# Patient Record
Sex: Male | Born: 1940 | Race: White | Hispanic: No | Smoking: Former smoker
Health system: Southern US, Community
[De-identification: ages and names within clinical notes are randomized; demographics above are authoritative.]

## PROBLEM LIST (undated history)

## (undated) DIAGNOSIS — E119 Type 2 diabetes mellitus without complications: Secondary | ICD-10-CM

## (undated) DIAGNOSIS — M1990 Unspecified osteoarthritis, unspecified site: Secondary | ICD-10-CM

## (undated) DIAGNOSIS — F32A Depression, unspecified: Secondary | ICD-10-CM

## (undated) DIAGNOSIS — I255 Ischemic cardiomyopathy: Secondary | ICD-10-CM

## (undated) DIAGNOSIS — M109 Gout, unspecified: Secondary | ICD-10-CM

## (undated) DIAGNOSIS — E785 Hyperlipidemia, unspecified: Secondary | ICD-10-CM

## (undated) DIAGNOSIS — I10 Essential (primary) hypertension: Secondary | ICD-10-CM

## (undated) HISTORY — DX: Type 2 diabetes mellitus without complications: E11.9

## (undated) HISTORY — DX: Ischemic cardiomyopathy: I25.5

## (undated) HISTORY — DX: Gout, unspecified: M10.9

## (undated) HISTORY — DX: Essential (primary) hypertension: I10

## (undated) HISTORY — DX: Unspecified osteoarthritis, unspecified site: M19.90

## (undated) HISTORY — DX: Depression, unspecified: F32.A

## (undated) HISTORY — DX: Hyperlipidemia, unspecified: E78.5

---

## 2005-12-15 ENCOUNTER — Ambulatory Visit: Payer: Self-pay | Admitting: Family Medicine

## 2005-12-21 ENCOUNTER — Ambulatory Visit: Payer: Self-pay | Admitting: Ophthalmology

## 2007-12-23 IMAGING — US US CAROTID DUPLEX BILAT
1 series · 17 of 24 positions shown · non-contrast
Comparison: none

REASON FOR EXAM: Hollenhorst plaque
COMMENTS:

[Series 1: us carotid duplex bilat · 17 of 53 slices shown]
[im 1/53]
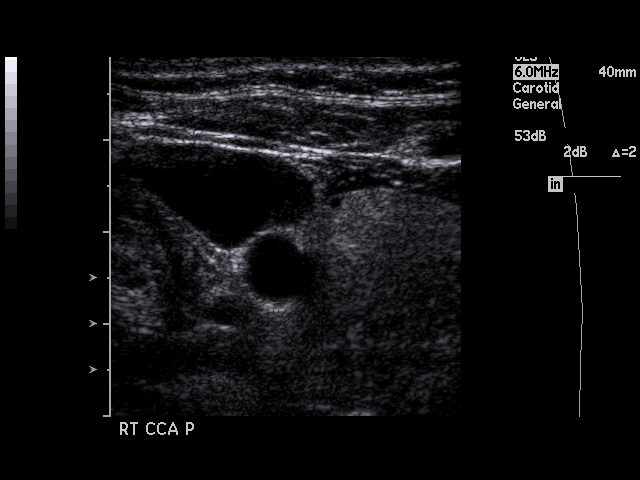
[im 5/53]
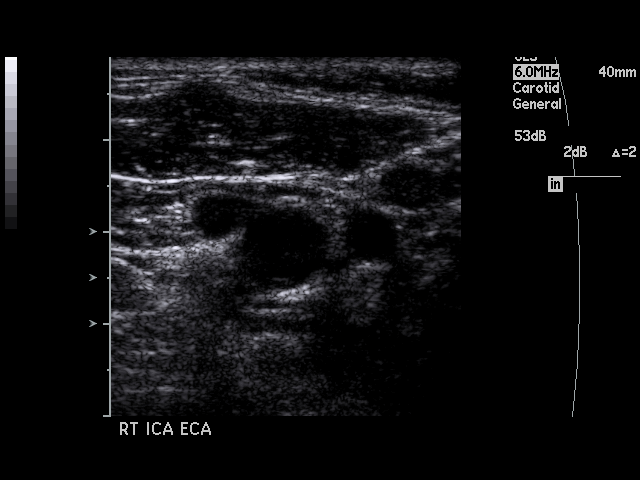
[im 7/53]
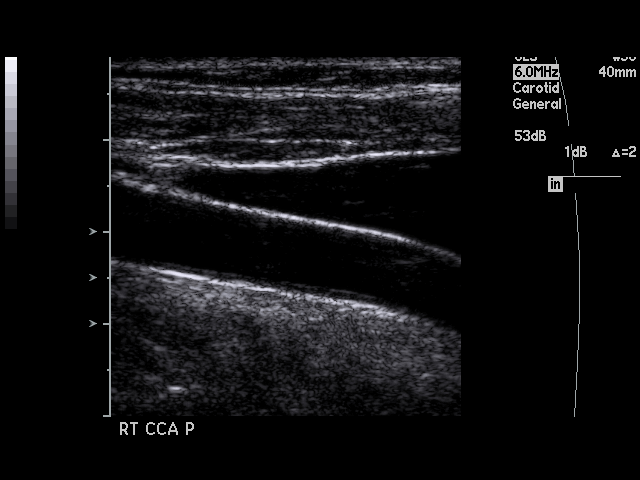
[im 10/53]
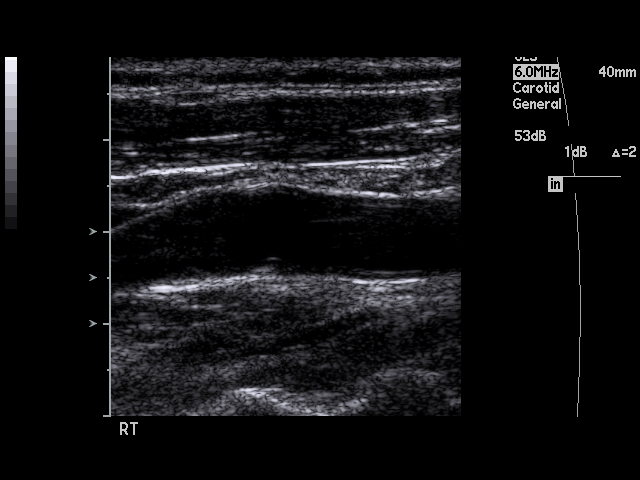
[im 14/53]
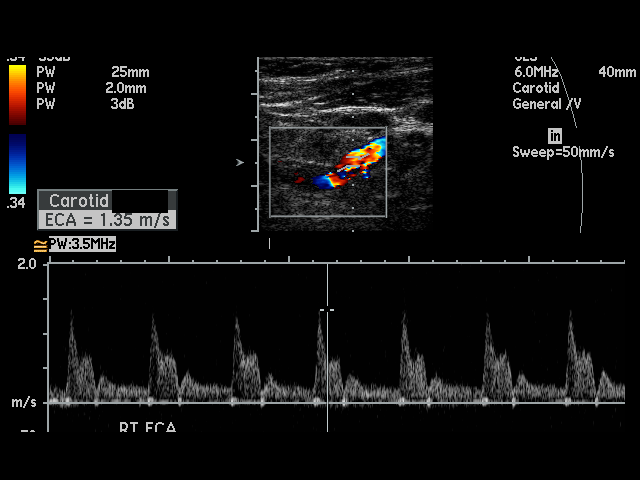
[im 16/53]
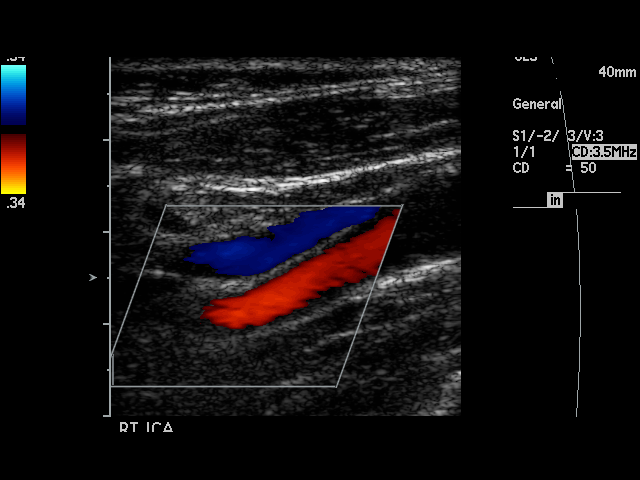
[im 21/53]
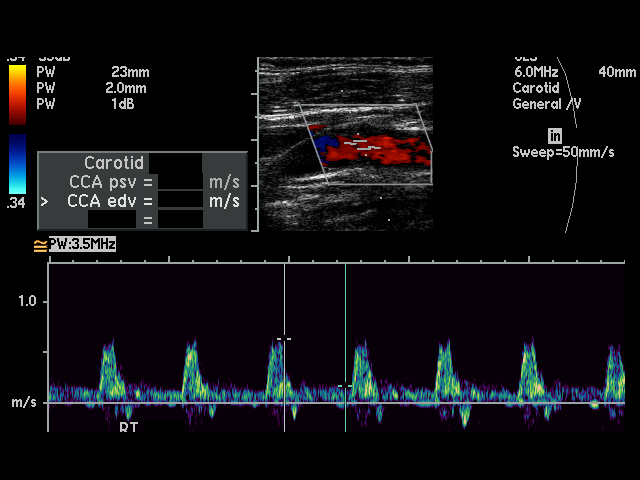
[im 23/53]
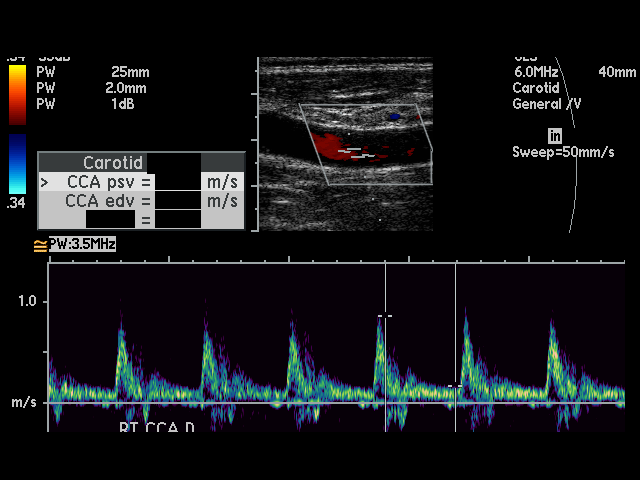
[im 28/53]
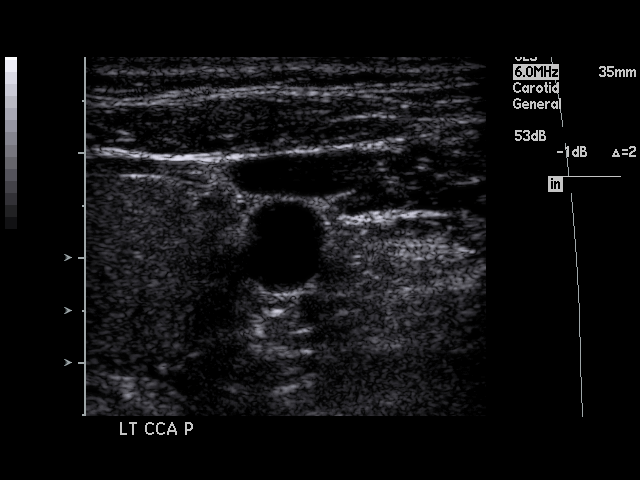
[im 30/53]
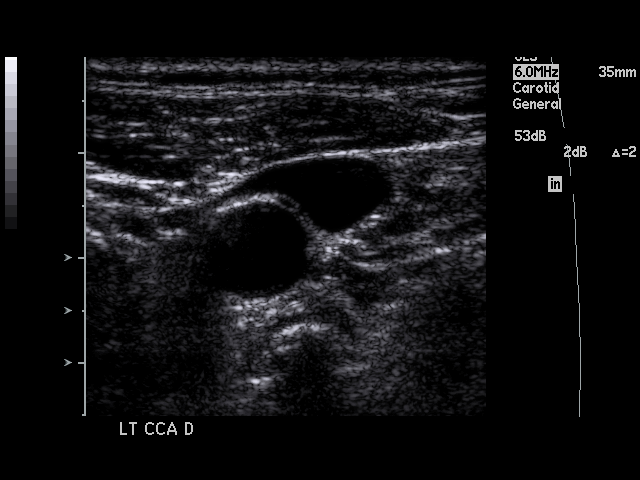
[im 32/53]
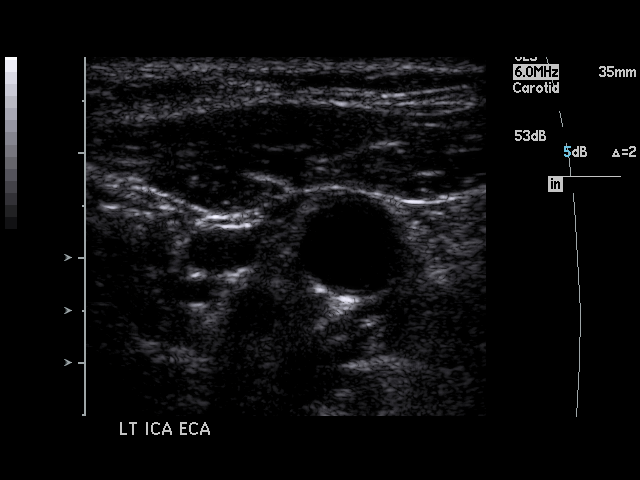
[im 37/53]
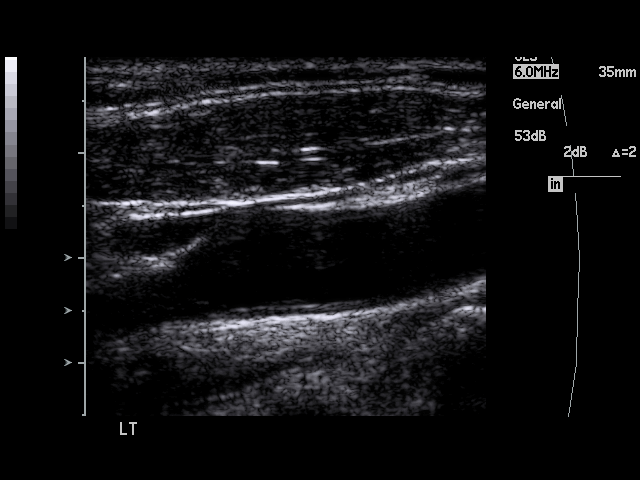
[im 39/53]
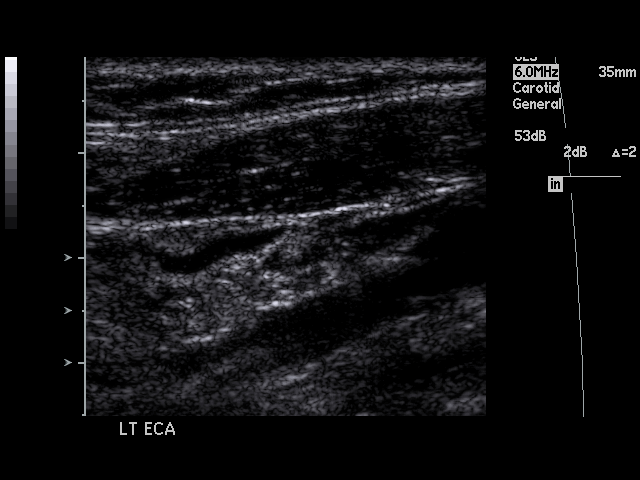
[im 43/53]
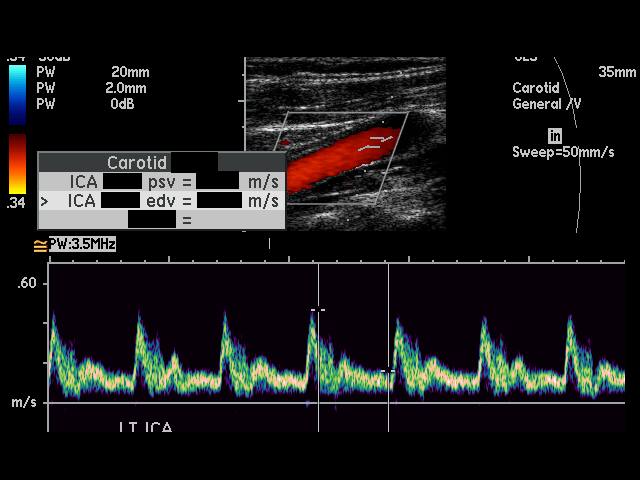
[im 46/53]
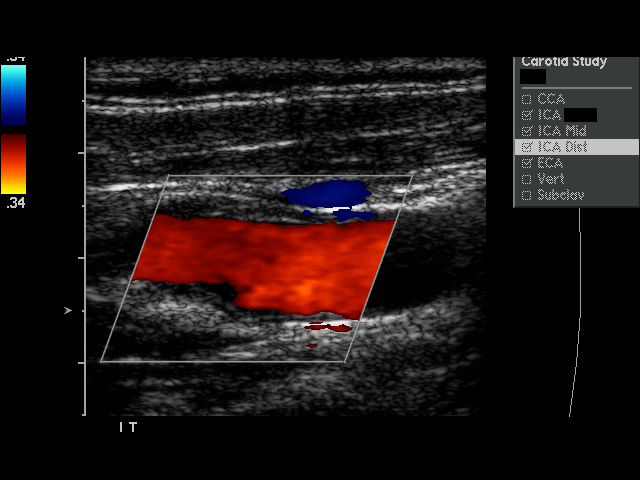
[im 48/53]
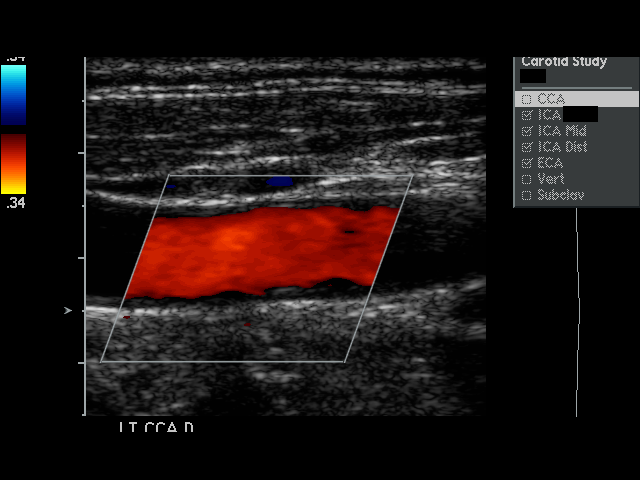
[im 53/53]
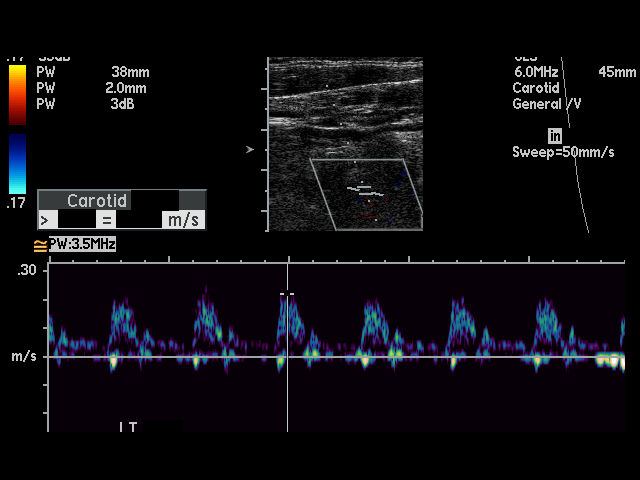

[17 of 24 positions shown; findings below may reference images not displayed]

PROCEDURE:     US  - US CAROTID DOPPLER BILATERAL  - December 21, 2005  [DATE]

RESULT:       Gray scale, color flow and SPECTRAL waveform images were
performed in evaluation of the RIGHT and LEFT carotid arteries.  Visual
evaluation demonstrates an area of soft plaque involving the carotid bulb of
the RIGHT carotid artery demonstrating less than 50% stenosis.  Similar
findings were demonstrated within the LEFT carotid artery.

ICA/CCA ratios:

RIGHT
LEFT

SPECTRAL waveform, color flow and gray scale is unremarkable within the
RIGHT and LEFT carotid systems.

Antegrade flow is demonstrated within the RIGHT and LEFT vertebral arteries.
IMPRESSION: No sonographic evidence of hemodynamically significant
stenosis within the RIGHT or LEFT carotid systems.

## 2011-06-16 DIAGNOSIS — I2510 Atherosclerotic heart disease of native coronary artery without angina pectoris: Secondary | ICD-10-CM | POA: Diagnosis present

## 2011-06-16 DIAGNOSIS — E1159 Type 2 diabetes mellitus with other circulatory complications: Secondary | ICD-10-CM | POA: Insufficient documentation

## 2011-06-16 DIAGNOSIS — E119 Type 2 diabetes mellitus without complications: Secondary | ICD-10-CM

## 2012-07-30 DIAGNOSIS — H5461 Unqualified visual loss, right eye, normal vision left eye: Secondary | ICD-10-CM | POA: Insufficient documentation

## 2014-01-22 ENCOUNTER — Ambulatory Visit: Payer: Self-pay | Admitting: Family Medicine

## 2014-01-22 DIAGNOSIS — F4321 Adjustment disorder with depressed mood: Secondary | ICD-10-CM | POA: Insufficient documentation

## 2015-02-16 ENCOUNTER — Other Ambulatory Visit: Payer: Self-pay | Admitting: Ophthalmology

## 2015-02-16 DIAGNOSIS — H4710 Unspecified papilledema: Secondary | ICD-10-CM

## 2015-02-19 ENCOUNTER — Ambulatory Visit: Payer: Medicare Other

## 2015-02-19 ENCOUNTER — Other Ambulatory Visit: Payer: Self-pay

## 2016-04-16 DIAGNOSIS — D75839 Thrombocytosis, unspecified: Secondary | ICD-10-CM | POA: Diagnosis present

## 2018-06-12 DIAGNOSIS — I255 Ischemic cardiomyopathy: Secondary | ICD-10-CM | POA: Diagnosis present

## 2018-06-12 DIAGNOSIS — I493 Ventricular premature depolarization: Secondary | ICD-10-CM | POA: Insufficient documentation

## 2020-12-16 ENCOUNTER — Other Ambulatory Visit: Payer: Self-pay

## 2020-12-16 ENCOUNTER — Encounter: Payer: Self-pay | Admitting: *Deleted

## 2020-12-16 ENCOUNTER — Encounter: Payer: Medicare HMO | Attending: Family Medicine | Admitting: *Deleted

## 2020-12-16 VITALS — BP 108/60 | Ht 71.0 in | Wt 230.1 lb

## 2020-12-16 DIAGNOSIS — E119 Type 2 diabetes mellitus without complications: Secondary | ICD-10-CM | POA: Diagnosis present

## 2020-12-16 NOTE — Patient Instructions (Addendum)
Exercise: Continue walking for   20  minutes  3 days a week and gradually increase as tolerated  Eat 3 meals day, 1-2  snacks a day Space meals 4-6 hours apart Don't skip meals Eat 1 serving of protein with each meal and when eating fruit for a snack Avoid sugar sweetened drinks (soda) when using as a mixer for alchohol  Complete 3 Day Food Record and bring to next appt  Make an eye doctor appointment  Return for appointment on:  Wednesday January 13, 2021 at 10:30 am with [facility] (dietitian)

## 2020-12-17 NOTE — Progress Notes (Signed)
Diabetes Self-Management Education  Visit Type: First/Initial  Appt. Start Time: 1535 Appt. End Time: 1635  12/16/2020  Mr. [name] [name], identified by name and date of birth, is a 80 y.o. male with a diagnosis of Diabetes: Type 2.   ASSESSMENT  Blood pressure 108/60, height 5\' 11"  (1.803 m), weight 230 lb 1.6 oz (104.4 kg). Body mass index is 32.09 kg/m.   Diabetes Self-Management Education - 12/16/20 1643      Visit Information   Visit Type First/Initial      Initial Visit   Diabetes Type Type 2    Are you currently following a meal plan? No   "I stopped eating bread"   Are you taking your medications as prescribed? Yes    Date Diagnosed Jan 2021      Health Coping   How would you rate your overall health? Good      Psychosocial Assessment   Patient Belief/Attitude about Diabetes Other (comment)   "I feel I can handle it"   Self-care barriers None    Self-management support Doctor's office    Patient Concerns Nutrition/Meal planning;Glycemic Control;Medication;Monitoring;Healthy Lifestyle;Weight Control    Special Needs None    Preferred Learning Style Visual;Hands on    Learning Readiness Change in progress    How often do you need to have someone help you when you read instructions, pamphlets, or other written materials from your doctor or pharmacy? 1 - Never    What is the last grade level you completed in school? 10th      Pre-Education Assessment   Patient understands the diabetes disease and treatment process. Needs Instruction    Patient understands incorporating nutritional management into lifestyle. Needs Instruction    Patient undertands incorporating physical activity into lifestyle. Needs Instruction    Patient understands using medications safely. Needs Instruction    Patient understands monitoring blood glucose, interpreting and using results Needs Instruction    Patient understands prevention, detection, and treatment of acute complications. Needs  Instruction    Patient understands prevention, detection, and treatment of chronic complications. Needs Instruction    Patient understands how to develop strategies to address psychosocial issues. Needs Instruction    Patient understands how to develop strategies to promote health/change behavior. Needs Instruction      Complications   Last HgB A1C per patient/outside source 6.4 %   12/16/2020   How often do you check your blood sugar? Patient declines   Pt doesn't want to check his blood sugars at home. BG in the office was 110 mg/dL at 02/15/2021 pm - 5 1/2 hrs pp.   Have you had a dilated eye exam in the past 12 months? No    Have you had a dental exam in the past 12 months? No   dentures   Are you checking your feet? Yes    How many days per week are you checking your feet? 7      Dietary Intake   Breakfast raisin bran or cheerios with milk; eggs and bacon    Lunch skips or eats a banana    Snack (afternoon) 0-1 snack/day - peanut butter crackers    Dinner hamburger steak, chicken, pork, fish; green beans, peas, pinto beans, greens, cabbage, salad wtih lettuce tomatoes cuccumbers carrots    Beverage(s) water, coffee wtih Splenda, regular Coke if mixing drink      Exercise   Exercise Type Light (walking / raking leaves)    How many days per week to you exercise?  3    How many minutes per day do you exercise? 20    Total minutes per week of exercise 60      Patient Education   Previous Diabetes Education No    Disease state  Definition of diabetes, type 1 and 2, and the diagnosis of diabetes;Factors that contribute to the development of diabetes    Nutrition management  Role of diet in the treatment of diabetes and the relationship between the three main macronutrients and blood glucose level;Food label reading, portion sizes and measuring food.    Physical activity and exercise  Role of exercise on diabetes management, blood pressure control and cardiac health.    Monitoring Identified  appropriate SMBG and/or A1C goals.;Yearly dilated eye exam    Chronic complications Relationship between chronic complications and blood glucose control    Psychosocial adjustment Identified and addressed patients feelings and concerns about diabetes      Individualized Goals (developed by patient)   Reducing Risk Other (comment)   improve blood sugars, decrease medications, prevent diabetes complications, lose weight, lead a healthier lifestyle, become more fit     Outcomes   Expected Outcomes Demonstrated interest in learning. Expect positive outcomes    Future DMSE 4-6 wks           Individualized Plan for Diabetes Self-Management Training:   Learning Objective:  Patient will have a greater understanding of diabetes self-management. Patient education plan is to attend individual and/or group sessions per assessed needs and concerns.   Plan:   Patient Instructions  Exercise: Continue walking for   20  minutes  3 days a week and gradually increase as tolerated  Eat 3 meals day, 1-2  snacks a day Space meals 4-6 hours apart Don't skip meals Eat 1 serving of protein with each meal and when eating fruit for a snack Avoid sugar sweetened drinks (soda) when using as a mixer for alchohol  Complete 3 Day Food Record and bring to next appt  Make an eye doctor appointment  Return for appointment on:  Wednesday January 13, 2021 at 10:30 am with [facility] (dietitian)   Expected Outcomes:  Demonstrated interest in learning. Expect positive outcomes  Education material provided: General Meal Planning Guidelines Simple Meal Plan 3 Day Food Record Plate Method (ADA)  If problems or questions, patient to contact team via:  Sharion Settler, RN, CCM, CDCES [phone]  Future DSME appointment: 4-6 wks  Pt reports that Diabetes classes are too long. He will return for the 2 Hour refresher Program and his next appointment is scheduled for June 8 with the dietitian.

## 2021-01-13 ENCOUNTER — Ambulatory Visit: Payer: Medicare HMO | Admitting: Dietician

## 2021-02-18 ENCOUNTER — Encounter: Payer: Self-pay | Admitting: Dietician

## 2021-02-18 NOTE — Progress Notes (Signed)
Have not heard back from patient to reschedule his missed appointment from 01/13/21. Sent notification to referring provider.

## 2023-08-12 ENCOUNTER — Emergency Department: Payer: Medicare HMO

## 2023-08-12 ENCOUNTER — Other Ambulatory Visit: Payer: Self-pay

## 2023-08-12 ENCOUNTER — Emergency Department
Admission: EM | Admit: 2023-08-12 | Discharge: 2023-08-12 | Disposition: A | Discharge status: Home / Self Care | Payer: Medicare HMO | Attending: Emergency Medicine | Admitting: Emergency Medicine

## 2023-08-12 DIAGNOSIS — R112 Nausea with vomiting, unspecified: Secondary | ICD-10-CM | POA: Insufficient documentation

## 2023-08-12 DIAGNOSIS — R059 Cough, unspecified: Secondary | ICD-10-CM | POA: Insufficient documentation

## 2023-08-12 DIAGNOSIS — E119 Type 2 diabetes mellitus without complications: Secondary | ICD-10-CM | POA: Diagnosis not present

## 2023-08-12 DIAGNOSIS — R0602 Shortness of breath: Secondary | ICD-10-CM | POA: Insufficient documentation

## 2023-08-12 DIAGNOSIS — I10 Essential (primary) hypertension: Secondary | ICD-10-CM | POA: Insufficient documentation

## 2023-08-12 DIAGNOSIS — R0981 Nasal congestion: Secondary | ICD-10-CM | POA: Insufficient documentation

## 2023-08-12 DIAGNOSIS — Z20822 Contact with and (suspected) exposure to covid-19: Secondary | ICD-10-CM | POA: Diagnosis not present

## 2023-08-12 DIAGNOSIS — R531 Weakness: Secondary | ICD-10-CM | POA: Diagnosis present

## 2023-08-12 LAB — CBC WITH DIFFERENTIAL/PLATELET
Abs Immature Granulocytes: 0.07 10*3/uL (ref 0.00–0.07)
Basophils Absolute: 0 10*3/uL (ref 0.0–0.1)
Basophils Relative: 0 %
Eosinophils Absolute: 0.1 10*3/uL (ref 0.0–0.5)
Eosinophils Relative: 1 %
HCT: 47.5 % (ref 39.0–52.0)
Hemoglobin: 16.1 g/dL (ref 13.0–17.0)
Immature Granulocytes: 1 %
Lymphocytes Relative: 6 %
Lymphs Abs: 0.7 10*3/uL (ref 0.7–4.0)
MCH: 29.5 pg (ref 26.0–34.0)
MCHC: 33.9 g/dL (ref 30.0–36.0)
MCV: 87.2 fL (ref 80.0–100.0)
Monocytes Absolute: 0.6 10*3/uL (ref 0.1–1.0)
Monocytes Relative: 6 %
Neutro Abs: 9.6 10*3/uL — ABNORMAL HIGH (ref 1.7–7.7)
Neutrophils Relative %: 86 %
Platelets: 639 10*3/uL — ABNORMAL HIGH (ref 150–400)
RBC: 5.45 MIL/uL (ref 4.22–5.81)
RDW: 14.2 % (ref 11.5–15.5)
WBC: 11.2 10*3/uL — ABNORMAL HIGH (ref 4.0–10.5)
nRBC: 0 % (ref 0.0–0.2)

## 2023-08-12 LAB — COMPREHENSIVE METABOLIC PANEL
ALT: 15 U/L (ref 0–44)
AST: 21 U/L (ref 15–41)
Albumin: 3.9 g/dL (ref 3.5–5.0)
Alkaline Phosphatase: 96 U/L (ref 38–126)
Anion gap: 14 (ref 5–15)
BUN: 16 mg/dL (ref 8–23)
CO2: 22 mmol/L (ref 22–32)
Calcium: 8.5 mg/dL — ABNORMAL LOW (ref 8.9–10.3)
Chloride: 103 mmol/L (ref 98–111)
Creatinine, Ser: 0.92 mg/dL (ref 0.61–1.24)
GFR, Estimated: >60 mL/min (ref >60)
Glucose, Bld: 227 mg/dL — ABNORMAL HIGH (ref 70–99)
Potassium: 2.9 mmol/L — ABNORMAL LOW (ref 3.5–5.1)
Sodium: 139 mmol/L (ref 135–145)
Total Bilirubin: 0.9 mg/dL (ref 0.0–1.2)
Total Protein: 7 g/dL (ref 6.5–8.1)

## 2023-08-12 LAB — TROPONIN I (HIGH SENSITIVITY)
Troponin I (High Sensitivity): 12 ng/L (ref <18)
Troponin I (High Sensitivity): 11 ng/L (ref <18)

## 2023-08-12 LAB — RESP PANEL BY RT-PCR (RSV, FLU A&B, COVID)  RVPGX2
Influenza A by PCR: NEGATIVE
Influenza B by PCR: NEGATIVE
Resp Syncytial Virus by PCR: NEGATIVE
SARS Coronavirus 2 by RT PCR: NEGATIVE

## 2023-08-12 MED ORDER — POTASSIUM CHLORIDE 10 MEQ/100ML IV SOLN
10.0000 meq | Freq: Once | INTRAVENOUS | Status: AC
Start: 2023-08-12 — End: 2023-08-12
  Administered 2023-08-12: 10 meq via INTRAVENOUS
  Filled 2023-08-12: qty 100

## 2023-08-12 MED ORDER — POTASSIUM CHLORIDE CRYS ER 20 MEQ PO TBCR
40.0000 meq | EXTENDED_RELEASE_TABLET | Freq: Once | ORAL | Status: AC
Start: 2023-08-12 — End: 2023-08-12
  Administered 2023-08-12: 40 meq via ORAL
  Filled 2023-08-12: qty 2

## 2023-08-12 MED ORDER — HYALURONIDASE HUMAN 150 UNIT/ML IJ SOLN
150.0000 [IU] | Freq: Once | INTRAMUSCULAR | Status: DC
  Filled 2023-08-12: qty 1

## 2023-08-12 MED ORDER — SODIUM CHLORIDE 0.9 % IV BOLUS
1000.0000 mL | Freq: Once | INTRAVENOUS | Status: AC
  Administered 2023-08-12: 1000 mL via INTRAVENOUS

## 2023-08-12 NOTE — ED Notes (Signed)
 Pt able to drink entire cup of water w/o issue.

## 2023-08-12 NOTE — ED Triage Notes (Signed)
 Patient states he woke up feeling "normal", sat down at the computer and started feeling faint; denies LOC. Patient complaining of generalized weakness, N/V. EMS concerned about Code Stroke, not called by Dr. Vicente Males.

## 2023-08-12 NOTE — ED Provider Triage Note (Signed)
 Emergency Medicine Provider Triage Evaluation Note  [name] [name] , a 83 y.o. male  was evaluated in triage.  Pt complains of weakness. Woke up feeling fine then later began to feel weak. No syncope.   Review of Systems  Positive: Weakness, nausea Negative:   Physical Exam  There were no vitals taken for this visit. Gen:   Awake, no distress   Resp:  Normal effort  MSK:   Moves extremities without difficulty  Other:  No facial droop, 5/5 strength in bilateral lower and upper extremities.   Medical Decision Making  Medically screening exam initiated at 11:45 AM.  Appropriate orders placed.  Pamela L Flores was informed that the remainder of the evaluation will be completed by another provider, this initial triage assessment does not replace that evaluation, and the importance of remaining in the ED until their evaluation is complete.    Cleaster Tinnie LABOR, PA-C 08/12/23 1148

## 2023-08-12 NOTE — ED Notes (Signed)
 Pt stating no pain to arm or IV site. Pt has normal color to arm.

## 2023-08-12 NOTE — Discharge Instructions (Signed)
 Please seek medical attention for any high fevers, chest pain, shortness of breath, change in behavior, persistent vomiting, bloody stool or any other new or concerning symptoms.

## 2023-08-12 NOTE — ED Provider Notes (Signed)
 [facility] Provider Note    Event Date/Time   First MD Initiated Contact with Patient 08/12/23 1340     (approximate)   History   Weakness   HPI [name] [name] is a 83 y.o. male with history of DM2, HTN, HLD presenting today for weakness.  He reports that over the past couple days he has had intermittent cough and congestion symptoms.  He has been gradually feeling weaker as well over the past 7 days.  Reportedly went to sit down on his computer when all of a sudden he felt faint and nauseous.  Had 2 episodes of vomiting.  Denied chest pain, shortness of breath, palpitations, syncope.  Denies symptoms similar to this in the past.  Currently in the ED he only notices slight weakness which improved with fluids with EMS.     Physical Exam   Triage Vital Signs: ED Triage Vitals  Encounter Vitals Group     BP 08/12/23 1152 (!) 161/78     Systolic BP Percentile --      Diastolic BP Percentile --      Pulse Rate 08/12/23 1152 74     Resp 08/12/23 1152 16     Temp 08/12/23 1152 97.9 F (36.6 C)     Temp Source 08/12/23 1152 Oral     SpO2 08/12/23 1152 96 %     Weight 08/12/23 1148 230 lb (104.3 kg)     Height 08/12/23 1148 5' 11 (1.803 m)     Head Circumference --      Peak Flow --      Pain Score 08/12/23 1147 0     Pain Loc --      Pain Education --      Exclude from Growth Chart --     Most recent vital signs: Vitals:   08/12/23 1152  BP: (!) 161/78  Pulse: 74  Resp: 16  Temp: 97.9 F (36.6 C)  SpO2: 96%   Physical Exam: I have reviewed the vital signs and nursing notes. General: Awake, alert, no acute distress.  Nontoxic appearing. Head:  Atraumatic, normocephalic.   ENT:  EOM intact, PERRL. Oral mucosa is pink and moist with no lesions. Neck: Neck is supple with full range of motion, No meningeal signs. Cardiovascular:  RRR, No murmurs. Peripheral pulses palpable and equal bilaterally. Respiratory:  Symmetrical chest wall  expansion.  No rhonchi, rales, or wheezes.  Good air movement throughout.  No use of accessory muscles.   Musculoskeletal:  No cyanosis or edema. Moving extremities with full ROM Abdomen:  Soft, nontender, nondistended. Neuro:  GCS 15, moving all four extremities, interacting appropriately. Speech clear.  Cranial nerves II through XII intact.  5 out of 5 strength in bilateral upper and lower extremities.  Sensation intact and equal to bilateral upper and lower extremities. Psych:  Calm, appropriate.   Skin:  Warm, dry, no rash.    ED Results / Procedures / Treatments   Labs (all labs ordered are listed, but only abnormal results are displayed) Labs Reviewed  COMPREHENSIVE METABOLIC PANEL - Abnormal; Notable for the following components:      Result Value   Potassium 2.9 (*)    Glucose, Bld 227 (*)    Calcium  8.5 (*)    All other components within normal limits  CBC WITH DIFFERENTIAL/PLATELET - Abnormal; Notable for the following components:   WBC 11.2 (*)    Platelets 639 (*)    Neutro Abs 9.6 (*)  All other components within normal limits  RESP PANEL BY RT-PCR (RSV, FLU A&B, COVID)  RVPGX2  TROPONIN I (HIGH SENSITIVITY)  TROPONIN I (HIGH SENSITIVITY)     EKG My EKG interpretation: Rate of 75, normal sinus rhythm, normal axis, normal intervals.  T wave inversions present in lead II, 3, aVF, V4, V5, V6.  No prior EKG to compare to.  No other obvious ST elevation or depression   RADIOLOGY Independently interpreted chest x-ray with no acute pathology   PROCEDURES:  Critical Care performed: No  Procedures   MEDICATIONS ORDERED IN ED: Medications  potassium chloride  10 mEq in 100 mL IVPB (10 mEq Intravenous New Bag/Given 08/12/23 1501)  sodium chloride  0.9 % bolus 1,000 mL (1,000 mLs Intravenous New Bag/Given 08/12/23 1459)  potassium chloride  SA (KLOR-CON  M) CR tablet 40 mEq (40 mEq Oral Given 08/12/23 1454)     IMPRESSION / MDM / ASSESSMENT AND PLAN / ED COURSE  I  reviewed the triage vital signs and the nursing notes.                              Differential diagnosis includes, but is not limited to, near vasovagal episode, orthostatic hypotension, dehydration, electrolyte abnormality, cardiac arrhythmia  Patient's presentation is most consistent with acute complicated illness / injury requiring diagnostic workup.  Patient is an 83 year old male presenting today for weakness in the setting of 1 episode of lightheadedness with nausea and vomiting.  Symptoms completely resolved aside from weakness by the time of arrival.  Vital signs otherwise stable.  Physical exam and specifically neurological exam completely unremarkable.  EKG without any acute ischemic findings and troponins negative x 2.  Laboratory workup with mild hypokalemia but otherwise unremarkable.  Chest x-ray with no acute findings.  Patient given 1 L fluids as well as potassium repletion.  Signed out pending results of respiratory swab and p.o. challenge/ambulation.  Suspect patient will likely be able to go home and follow-up with PCP if all this is unremarkable.  The patient is on the cardiac monitor to evaluate for evidence of arrhythmia and/or significant heart rate changes.     FINAL CLINICAL IMPRESSION(S) / ED DIAGNOSES   Final diagnoses:  Weakness     Rx / DC Orders   ED Discharge Orders     None        Note:  This document was prepared using Dragon voice recognition software and may include unintentional dictation errors.   Malvina Alm DASEN, MD 08/12/23 [phone]

## 2023-08-12 NOTE — ED Notes (Addendum)
 Per Katie RN, line infusing potassium infiltrated. Pharmacy called reccomends hyaluronidase and ice with elevation.

## 2023-11-16 ENCOUNTER — Other Ambulatory Visit: Payer: Self-pay

## 2023-11-16 ENCOUNTER — Observation Stay

## 2023-11-16 ENCOUNTER — Inpatient Hospital Stay
Admission: EM | Admit: 2023-11-16 | Discharge: 2023-11-19 | DRG: 065 | Disposition: A | Discharge status: Rehabilitation Facility | Attending: Student | Admitting: Student

## 2023-11-16 ENCOUNTER — Emergency Department

## 2023-11-16 ENCOUNTER — Encounter: Payer: Self-pay | Admitting: Internal Medicine

## 2023-11-16 DIAGNOSIS — Z7982 Long term (current) use of aspirin: Secondary | ICD-10-CM

## 2023-11-16 DIAGNOSIS — I2510 Atherosclerotic heart disease of native coronary artery without angina pectoris: Secondary | ICD-10-CM | POA: Diagnosis not present

## 2023-11-16 DIAGNOSIS — E785 Hyperlipidemia, unspecified: Secondary | ICD-10-CM | POA: Diagnosis not present

## 2023-11-16 DIAGNOSIS — E639 Nutritional deficiency, unspecified: Secondary | ICD-10-CM | POA: Diagnosis present

## 2023-11-16 DIAGNOSIS — E119 Type 2 diabetes mellitus without complications: Secondary | ICD-10-CM

## 2023-11-16 DIAGNOSIS — I6389 Other cerebral infarction: Secondary | ICD-10-CM | POA: Diagnosis not present

## 2023-11-16 DIAGNOSIS — G8191 Hemiplegia, unspecified affecting right dominant side: Secondary | ICD-10-CM | POA: Diagnosis present

## 2023-11-16 DIAGNOSIS — R471 Dysarthria and anarthria: Secondary | ICD-10-CM | POA: Diagnosis present

## 2023-11-16 DIAGNOSIS — E559 Vitamin D deficiency, unspecified: Secondary | ICD-10-CM | POA: Diagnosis present

## 2023-11-16 DIAGNOSIS — I639 Cerebral infarction, unspecified: Secondary | ICD-10-CM | POA: Diagnosis not present

## 2023-11-16 DIAGNOSIS — Z6827 Body mass index (BMI) 27.0-27.9, adult: Secondary | ICD-10-CM

## 2023-11-16 DIAGNOSIS — R29713 NIHSS score 13: Secondary | ICD-10-CM | POA: Diagnosis present

## 2023-11-16 DIAGNOSIS — I5042 Chronic combined systolic (congestive) and diastolic (congestive) heart failure: Secondary | ICD-10-CM | POA: Diagnosis present

## 2023-11-16 DIAGNOSIS — R29712 NIHSS score 12: Secondary | ICD-10-CM | POA: Diagnosis not present

## 2023-11-16 DIAGNOSIS — Z955 Presence of coronary angioplasty implant and graft: Secondary | ICD-10-CM

## 2023-11-16 DIAGNOSIS — F05 Delirium due to known physiological condition: Secondary | ICD-10-CM | POA: Diagnosis not present

## 2023-11-16 DIAGNOSIS — M109 Gout, unspecified: Secondary | ICD-10-CM | POA: Diagnosis present

## 2023-11-16 DIAGNOSIS — Z833 Family history of diabetes mellitus: Secondary | ICD-10-CM

## 2023-11-16 DIAGNOSIS — D75839 Thrombocytosis, unspecified: Secondary | ICD-10-CM | POA: Diagnosis present

## 2023-11-16 DIAGNOSIS — Z91148 Patient's other noncompliance with medication regimen for other reason: Secondary | ICD-10-CM

## 2023-11-16 DIAGNOSIS — I63512 Cerebral infarction due to unspecified occlusion or stenosis of left middle cerebral artery: Principal | ICD-10-CM | POA: Diagnosis present

## 2023-11-16 DIAGNOSIS — R130 Aphagia: Principal | ICD-10-CM | POA: Diagnosis present

## 2023-11-16 DIAGNOSIS — I10 Essential (primary) hypertension: Secondary | ICD-10-CM | POA: Diagnosis not present

## 2023-11-16 DIAGNOSIS — I255 Ischemic cardiomyopathy: Secondary | ICD-10-CM | POA: Diagnosis not present

## 2023-11-16 DIAGNOSIS — Z87891 Personal history of nicotine dependence: Secondary | ICD-10-CM

## 2023-11-16 DIAGNOSIS — Z91199 Patient's noncompliance with other medical treatment and regimen due to unspecified reason: Secondary | ICD-10-CM

## 2023-11-16 DIAGNOSIS — Z79899 Other long term (current) drug therapy: Secondary | ICD-10-CM

## 2023-11-16 DIAGNOSIS — Z7902 Long term (current) use of antithrombotics/antiplatelets: Secondary | ICD-10-CM

## 2023-11-16 DIAGNOSIS — I110 Hypertensive heart disease with heart failure: Secondary | ICD-10-CM | POA: Diagnosis present

## 2023-11-16 DIAGNOSIS — E876 Hypokalemia: Secondary | ICD-10-CM | POA: Diagnosis present

## 2023-11-16 DIAGNOSIS — F32A Depression, unspecified: Secondary | ICD-10-CM | POA: Diagnosis present

## 2023-11-16 DIAGNOSIS — R29810 Facial weakness: Secondary | ICD-10-CM | POA: Diagnosis present

## 2023-11-16 DIAGNOSIS — E663 Overweight: Secondary | ICD-10-CM | POA: Diagnosis present

## 2023-11-16 LAB — COMPREHENSIVE METABOLIC PANEL WITH GFR
ALT: 13 U/L (ref 0–44)
AST: 20 U/L (ref 15–41)
Albumin: 4 g/dL (ref 3.5–5.0)
Alkaline Phosphatase: 85 U/L (ref 38–126)
Anion gap: 13 (ref 5–15)
BUN: 15 mg/dL (ref 8–23)
CO2: 21 mmol/L — ABNORMAL LOW (ref 22–32)
Calcium: 8.2 mg/dL — ABNORMAL LOW (ref 8.9–10.3)
Chloride: 104 mmol/L (ref 98–111)
Creatinine, Ser: 0.89 mg/dL (ref 0.61–1.24)
GFR, Estimated: >60 mL/min (ref >60)
Glucose, Bld: 184 mg/dL — ABNORMAL HIGH (ref 70–99)
Potassium: 3.2 mmol/L — ABNORMAL LOW (ref 3.5–5.1)
Sodium: 138 mmol/L (ref 135–145)
Total Bilirubin: 1.2 mg/dL (ref 0.0–1.2)
Total Protein: 6.5 g/dL (ref 6.5–8.1)

## 2023-11-16 LAB — CBC
HCT: 46.1 % (ref 39.0–52.0)
Hemoglobin: 15.6 g/dL (ref 13.0–17.0)
MCH: 29.6 pg (ref 26.0–34.0)
MCHC: 33.8 g/dL (ref 30.0–36.0)
MCV: 87.5 fL (ref 80.0–100.0)
Platelets: 689 10*3/uL — ABNORMAL HIGH (ref 150–400)
RBC: 5.27 MIL/uL (ref 4.22–5.81)
RDW: 13.8 % (ref 11.5–15.5)
WBC: 12.1 10*3/uL — ABNORMAL HIGH (ref 4.0–10.5)
nRBC: 0 % (ref 0.0–0.2)

## 2023-11-16 LAB — DIFFERENTIAL
Abs Immature Granulocytes: 0.04 10*3/uL (ref 0.00–0.07)
Basophils Absolute: 0 10*3/uL (ref 0.0–0.1)
Basophils Relative: 0 %
Eosinophils Absolute: 0.1 10*3/uL (ref 0.0–0.5)
Eosinophils Relative: 0 %
Immature Granulocytes: 0 %
Lymphocytes Relative: 7 %
Lymphs Abs: 0.8 10*3/uL (ref 0.7–4.0)
Monocytes Absolute: 0.9 10*3/uL (ref 0.1–1.0)
Monocytes Relative: 7 %
Neutro Abs: 10.3 10*3/uL — ABNORMAL HIGH (ref 1.7–7.7)
Neutrophils Relative %: 86 %

## 2023-11-16 LAB — ETHANOL: Alcohol, Ethyl (B): <10 mg/dL (ref <10)

## 2023-11-16 LAB — CBG MONITORING, ED: Glucose-Capillary: 182 mg/dL — ABNORMAL HIGH (ref 70–99)

## 2023-11-16 LAB — GLUCOSE, CAPILLARY: Glucose-Capillary: 136 mg/dL — ABNORMAL HIGH (ref 70–99)

## 2023-11-16 LAB — PROTIME-INR
INR: 1.1 (ref 0.8–1.2)
Prothrombin Time: 14.3 s (ref 11.4–15.2)

## 2023-11-16 LAB — APTT: aPTT: 34 s (ref 24–36)

## 2023-11-16 MED ORDER — ROSUVASTATIN CALCIUM 20 MG PO TABS
20.0000 mg | ORAL_TABLET | Freq: Every day | ORAL | Status: DC
  Administered 2023-11-16 – 2023-11-18 (×3): 20 mg via ORAL
  Filled 2023-11-16 (×3): qty 1

## 2023-11-16 MED ORDER — ENOXAPARIN SODIUM 40 MG/0.4ML IJ SOSY: 40.0000 mg | PREFILLED_SYRINGE | INTRAMUSCULAR | Status: DC

## 2023-11-16 MED ORDER — ACETAMINOPHEN 160 MG/5ML PO SOLN: 650.0000 mg | ORAL | Status: DC | PRN

## 2023-11-16 MED ORDER — ONDANSETRON HCL 4 MG/2ML IJ SOLN: 4.0000 mg | Freq: Four times a day (QID) | INTRAMUSCULAR | Status: DC | PRN

## 2023-11-16 MED ORDER — ACETAMINOPHEN 325 MG PO TABS: 650.0000 mg | ORAL_TABLET | ORAL | Status: DC | PRN

## 2023-11-16 MED ORDER — ASPIRIN 325 MG PO TABS
325.0000 mg | ORAL_TABLET | Freq: Once | ORAL | Status: DC
  Filled 2023-11-16: qty 1

## 2023-11-16 MED ORDER — INSULIN ASPART 100 UNIT/ML IJ SOLN
0.0000 [IU] | Freq: Three times a day (TID) | INTRAMUSCULAR | Status: DC
  Administered 2023-11-18: 2 [IU] via SUBCUTANEOUS
  Filled 2023-11-16: qty 1

## 2023-11-16 MED ORDER — SODIUM CHLORIDE 0.9% FLUSH
3.0000 mL | Freq: Once | INTRAVENOUS | Status: AC
  Administered 2023-11-16: 3 mL via INTRAVENOUS

## 2023-11-16 MED ORDER — ACETAMINOPHEN 650 MG RE SUPP: 650.0000 mg | RECTAL | Status: DC | PRN

## 2023-11-16 MED ORDER — STROKE: EARLY STAGES OF RECOVERY BOOK: Freq: Once | Status: AC

## 2023-11-16 MED ORDER — ASPIRIN 81 MG PO TBEC: 81.0000 mg | DELAYED_RELEASE_TABLET | Freq: Every day | ORAL | Status: DC

## 2023-11-16 MED ORDER — SENNOSIDES-DOCUSATE SODIUM 8.6-50 MG PO TABS: 1.0000 | ORAL_TABLET | Freq: Every evening | ORAL | Status: DC | PRN

## 2023-11-16 MED ORDER — IOHEXOL 350 MG/ML SOLN
75.0000 mL | Freq: Once | INTRAVENOUS | Status: AC | PRN
  Administered 2023-11-16: 75 mL via INTRAVENOUS

## 2023-11-16 MED ORDER — ENOXAPARIN SODIUM 60 MG/0.6ML IJ SOSY
0.5000 mg/kg | PREFILLED_SYRINGE | INTRAMUSCULAR | Status: DC
  Administered 2023-11-16 – 2023-11-18 (×3): 52.5 mg via SUBCUTANEOUS
  Filled 2023-11-16 (×3): qty 0.6

## 2023-11-16 NOTE — ED Provider Notes (Signed)
 [facility] Provider Note    Event Date/Time   First MD Initiated Contact with Patient 11/16/23 1502     (approximate)   History   Code Stroke   HPI  [name] [name] is a 83 y.o. male  who presents to the emergency department today because of concern for slurred speech and right facial droop. Last seen normal around 930am. The patient is unable to give exact time when symptoms started.  Patient with history of HTN, DM, HLD.       Physical Exam   Triage Vital Signs: ED Triage Vitals  Encounter Vitals Group     BP      Systolic BP Percentile      Diastolic BP Percentile      Pulse      Resp      Temp      Temp src      SpO2      Weight      Height      Head Circumference      Peak Flow      Pain Score      Pain Loc      Pain Education      Exclude from Growth Chart     Most recent vital signs: There were no vitals filed for this visit.  General: Awake, alert, aphagia. CV:  Good peripheral perfusion.  Resp:  Normal effort.  Abd:  No distention.    ED Results / Procedures / Treatments   Labs (all labs ordered are listed, but only abnormal results are displayed) Labs Reviewed  CBC - Abnormal; Notable for the following components:      Result Value   WBC 12.1 (*)    Platelets 689 (*)    All other components within normal limits  DIFFERENTIAL - Abnormal; Notable for the following components:   Neutro Abs 10.3 (*)    All other components within normal limits  COMPREHENSIVE METABOLIC PANEL WITH GFR - Abnormal; Notable for the following components:   Potassium 3.2 (*)    CO2 21 (*)    Glucose, Bld 184 (*)    Calcium 8.2 (*)    All other components within normal limits  CBG MONITORING, ED - Abnormal; Notable for the following components:   Glucose-Capillary 182 (*)    All other components within normal limits  PROTIME-INR  APTT  ETHANOL  I-STAT CREATININE, ED     EKG  I, Phineas Semen, attending physician,  personally viewed and interpreted this EKG  EKG Time: 1545 Rate: 82 Rhythm: sinus rhythm Axis: left axis deviation Intervals: qtc 608 QRS: narrow, q waves II, III, aVF, v4, v5, v6 ST changes: no st elevation Impression: abnormal ekg   RADIOLOGY I independently interpreted and visualized the CT head. My interpretation: No bleed Radiology interpretation:  IMPRESSION:  1. 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  2. Subtle hypoattenuation at the anterior limb of the right internal  capsule.  3. Subcortical white matter infarct in the anterior right frontal  lobe superior to the frontal horn of the right lateral ventricle.  4. Age indeterminate infarct in the left lentiform nucleus.  5. Moderate atrophy and white matter disease likely reflects the  sequela of chronic microvascular ischemia.  6. No acute hemorrhage or mass lesion.      PROCEDURES:  Critical Care performed: No   MEDICATIONS ORDERED IN ED: Medications  sodium chloride flush (NS)  0.9 % injection 3 mL (has no administration in time range)     IMPRESSION / MDM / ASSESSMENT AND PLAN / ED COURSE  I reviewed the triage vital signs and the nursing notes.                              Differential diagnosis includes, but is not limited to, CVA, TIA, anemia, electrolyte abnormality  Patient's presentation is most consistent with acute presentation with potential threat to life or bodily function.   The patient is on the cardiac monitor to evaluate for evidence of arrhythmia and/or significant heart rate changes.  Patient presented to the emergency department today as a code stroke.  Unfortunately patient was last seen normal around 930 this morning.  Patient was seen by neurology upon arrival to the emergency department.  Patient is not a TNK candidate.  CT imaging of the head did not show any LVO.  Will plan on admission here for further stroke workup.       FINAL CLINICAL IMPRESSION(S) / ED DIAGNOSES   Final diagnoses:  Aphagia     Note:  This document was prepared using Dragon voice recognition software and may include unintentional dictation errors.    Phineas Semen, MD 11/16/23 [id number]

## 2023-11-16 NOTE — Progress Notes (Signed)
 PHARMACIST - PHYSICIAN COMMUNICATION  CONCERNING:  Enoxaparin (Lovenox) for DVT Prophylaxis   ASSESSMENT: Patient was prescribed enoxaparin 40 mg subcutaneously every 24 hours for VTE prophylaxis.   Wt 104 kg, Ht 71 in  Based on [facility] policy, patient qualifies for enoxaparin dosing of 0.5 mg per kilogram of total body weight every 24 hours because their body mass index is >30 kg/m2.  PLAN: Pharmacy has adjusted enoxaparin dose per [facility] policy.  Description: Patient is now receiving enoxaparin 0.5 mg/kg subcutaneously every 24 hours.  Will M. Dareen Piano, PharmD Clinical Pharmacist 11/16/2023 6:35 PM

## 2023-11-16 NOTE — ED Notes (Signed)
 Patient transported to CT

## 2023-11-16 NOTE — Progress Notes (Signed)
 CODE STROKE- PHARMACY COMMUNICATION   Time CODE STROKE called/page received: 1436  Time response to CODE STROKE was made (in person or via phone): Immediately  Time Stroke Kit retrieved from [employer] (only if needed): N/A, out of window  Name of Provider/Nurse contacted: Dr. Selina Cooley  Past Medical History:  Diagnosis Date   Arthritis    Depression    Diabetes mellitus without complication (HCC)    Gout    Hyperlipidemia    Hypertension    Ischemic cardiomyopathy    [name] [name] 11/16/2023  3:00 PM

## 2023-11-16 NOTE — Assessment & Plan Note (Signed)
 Patient is presenting with right-sided facial droop, right weakness and slurred speech with symptoms beginning sometime between 9:30 AM and 2 PM today.  No evidence of LVO on imaging and outside of the window for TNK.  - Neurology consulted; appreciate their recommendations - MRI brain pending - Telemetry monitoring - Allow for permissive HTN (systolic < 220 and diastolic < 120) - Echocardiogram  - A1C and Lipid panel  - Start aspirin pending swallow study - Start high intensity statin pending swallow study - PT/OT/SLP

## 2023-11-16 NOTE — Assessment & Plan Note (Signed)
 Per chart review, patient has a history of CAD s/p bare-metal stent to mid LAD in 2012.  Last follow-up with cardiology was in 2019.  - Restart aspirin and statin once he passes swallow study

## 2023-11-16 NOTE — Consult Note (Signed)
 NEUROLOGY CONSULT NOTE   Date of service: November 16, 2023 Patient Name: [name] [name] MRN:  [id number] DOB:  1941-01-30 Chief Complaint: R facial droop and dysarthria Requesting Provider: Phineas Semen, MD  History of Present Illness   [name] [name] is a 83 y.o. male with hx of diabetes, hypertension, hyperlipidemia, cardiomyopathy who presents with acute onset of right sided facial droop and dysarthria.  Per neighbor who stopped by this morning he was normal at approximately 9:30 AM.  He stopped by again around 2 PM and he noticed that he had very slurred speech and had a right facial droop.  Stat code stroke was activated and route to [facility] ED.  Exam on arrival was significant for left gaze preference, no blink to threat on the right, right facial droop, right greater than left leg weakness, moderate dysarthria, mild to moderate expressive aphasia.  Stroke scale was 12.  Head CT showed no acute process within aspects of 9.  TNK was not administered due to presentation outside the window.  CTA showed no emergent LVO.  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  LKW: 0930 Modified rankin score: 2-Slight disability-UNABLE to perform all activities but does not need assistance IV Thrombolysis: No outside window  NIHSS components Score: Comment  1a Level of Conscious 0[x]  1[]  2[]  3[]      1b LOC Questions 0[x]  1[]  2[]       1c LOC Commands 0[x]  1[]  2[]       2 Best Gaze 0[]  1[x]  2[]       3 Visual 0[]  1[x]  2[]  3[]      4 Facial Palsy 0[]  1[]  2[x]  3[]      5a Motor Arm - left 0[x]  1[]  2[]  3[]  4[]  UN[]    5b Motor Arm - Right 0[x]  1[]  2[]  3[]  4[]  UN[]    6a Motor Leg - Left 0[]  1[]  2[x]  3[]  4[]  UN[]    6b Motor Leg - Right 0[]  1[]  2[]  3[x]  4[]  UN[]    7 Limb Ataxia 0[x]  1[]  2[]  3[]  UN[]     8 Sensory 0[x]  1[]  2[]  UN[]      9 Best Language 0[]  1[x]  2[]  3[]      10 Dysarthria 0[]  1[x]  2[]  UN[]      11 Extinct. and Inattention 0[]  1[x]  2[]       TOTAL:  12   Any unchecked boxes  represent a score of 0   ROS  Comprehensive ROS performed and pertinent positives documented in HPI   Past History   Past Medical History:  Diagnosis Date   Arthritis    Depression    Diabetes mellitus without complication (HCC)    Gout    Hyperlipidemia    Hypertension    Ischemic cardiomyopathy     No past surgical history on file.  Family History: Family History  Problem Relation Age of Onset   Diabetes Sister     Social History  reports that he quit smoking about 34 years ago. His smoking use included cigarettes. He has never used smokeless tobacco. He reports current alcohol use of about 3.0 standard drinks of alcohol per week. He reports that he does not use drugs.  No Known Allergies  Medications   Current Facility-Administered Medications:    sodium chloride flush (NS) 0.9 % injection 3 mL, 3 mL, Intravenous, Once, Phineas Semen, MD  Current Outpatient Medications:    ASPIRIN 81 PO, Take 1 tablet by mouth daily., Disp: , Rfl:    atenolol (TENORMIN) 50 MG tablet, Take 100 mg by mouth daily., Disp: ,  Rfl:    atorvastatin (LIPITOR) 80 MG tablet, Take 1 tablet by mouth daily., Disp: , Rfl:    colchicine 0.6 MG tablet, PLEASE SEE ATTACHED FOR DETAILED DIRECTIONS, Disp: , Rfl:    diclofenac Sodium (VOLTAREN) 1 % GEL, APPLY 2 GRAMS TO AFFECTED AREA 4 TIMES A DAY, Disp: , Rfl:    diltiazem (CARDIZEM CD) 120 MG 24 hr capsule, Take 120 mg by mouth daily., Disp: , Rfl:    FLUoxetine (PROZAC) 20 MG capsule, Take 20 mg by mouth daily., Disp: , Rfl:    hydrochlorothiazide (HYDRODIURIL) 12.5 MG tablet, Take 12.5 mg by mouth daily., Disp: , Rfl:    lisinopril (ZESTRIL) 10 MG tablet, Take 1 tablet by mouth daily., Disp: , Rfl:    nitroGLYCERIN (NITROSTAT) 0.4 MG SL tablet, PLEASE SEE ATTACHED FOR DETAILED DIRECTIONS, Disp: , Rfl:    terbinafine (LAMISIL) 250 MG tablet, Take 250 mg by mouth daily., Disp: , Rfl:   Vitals  There were no vitals filed for this visit.  There is  no height or weight on file to calculate BMI.  Physical Exam   Gen: patient lying on stretcher, NAD CV: extremities appear well-perfused Resp: normal WOB  Neurologic Examination   MS: alert, oriented x3, follows most simple commands Speech: moderate dysarthria, moderate expressive aphasia CN: PERRL, blinks to threat on L only, L gaze preference but can be overcome with oculocephalics, sensation intact, R facial droop, hearing intact to voice Motor: BUE no drift, LLE drift to bed, RLE no movement against gravity Sensory: SILT Reflexes: 2+ symm with toes down bilat Coordination: UTA Gait: deferred  Labs/Imaging/Neurodiagnostic studies   CBC:  Recent Labs  Lab 26-Nov-2023 1500  WBC 12.1*  NEUTROABS 10.3*  HGB 15.6  HCT 46.1  MCV 87.5  PLT 689*   Basic Metabolic Panel:  Lab Results  Component Value Date   NA 138 11-26-23   K 3.2 (L) 11/26/23   CO2 21 (L) 2023/11/26   GLUCOSE 184 (H) Nov 26, 2023   BUN 15 11-26-2023   CREATININE 0.89 Nov 26, 2023   CALCIUM 8.2 (L) 2023-11-26   GFRNONAA >60 November 26, 2023   Lipid Panel: No results found for: "LDLCALC" HgbA1c: No results found for: "HGBA1C" Urine Drug Screen: No results found for: "LABOPIA", "COCAINSCRNUR", "LABBENZ", "AMPHETMU", "THCU", "LABBARB"  Alcohol Level     Component Value Date/Time   ETH <10 11-26-2023 1500   INR  Lab Results  Component Value Date   INR 1.1 11-26-2023   APTT  Lab Results  Component Value Date   APTT 34 11/26/23   AED levels: No results found for: "PHENYTOIN", "ZONISAMIDE", "LAMOTRIGINE", "LEVETIRACETA"  CT Head without contrast(Personally reviewed): 1. 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 2. Subtle hypoattenuation at the anterior limb of the right internal capsule. 3. 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 4. Age indeterminate infarct  in the left lentiform nucleus. 5. Moderate atrophy and white matter disease likely reflects the sequela of chronic microvascular ischemia. 6. No acute hemorrhage or mass lesion.  CT angio Head and Neck with contrast(Personally reviewed): No LVO   ASSESSMENT   [name] [name] is a 82 y.o. male with hx of diabetes, hypertension, hyperlipidemia, cardiomyopathy who presents with acute onset of right sided facial droop and dysarthria.  He was outside the window for TNK. CTA showed no LVO.  RECOMMENDATIONS   - Admit for stroke workup - Permissive  HTN x48 hrs from sx onset or until stroke ruled out by MRI goal BP <220/110. PRN labetalol or hydralazine if BP above these parameters. Avoid oral antihypertensives. - MRI brain wo contras - TTE - Check A1c and LDL + add statin per guidelines - ASA 81mg  daily + plavix 75mg  daily x21 days f/b plavix 75mg  daily monotherapy after that - q4 hr neuro checks - STAT head CT for any change in neuro exam - Tele - PT/OT/SLP - Stroke education - Amb referral to neurology upon discharge   Will continue to follow.  ______________________________________________________________________    Signed, Jefferson Fuel, MD [facility]

## 2023-11-16 NOTE — Code Documentation (Signed)
 Stroke Response Nurse Documentation Code Documentation  [name] [name] is a 83 y.o. male arriving to [facility] via [facility] on 11/16/2023 with past medical hx of DM, HLD, HTN, ischemic cardiomyopathy. On aspirin 81 mg daily. Code stroke was activated by EMS.   Patient from home where he was LKW at 0930-1000 and now complaining of slurred speech, facial droop . Per EMS, pt was last seen by a friend around 0930-1000. Friend left and returned around 1415 and noticed he was  had slurred speech facial droop and EMS was called.    Stroke team at the bedside on patient arrival. Labs drawn  in CT. EDP saw patient after CT. Patient to CT with team. NIHSS 13, see documentation for details and code stroke times. Patient with disoriented, left gaze preference , right hemianopia, right facial droop, bilateral leg weakness, Expressive aphasia , dysarthria , and Visual  neglect on exam. The following imaging was completed:  CT Head and CTA. Patient is not a candidate for IV Thrombolytic due to outside window, per MD. Patient is not a candidate for IR due to no LVO on imaging, per MD.   Care Plan: every 2 hour NIHSS and vital signs. Swallow screen per protocol  Process Delays Noted:  Bedside handoff with ED RN Shawna Orleans.    Wille Glaser  Stroke Response RN

## 2023-11-16 NOTE — ED Notes (Signed)
 Family at bedside.

## 2023-11-16 NOTE — Assessment & Plan Note (Addendum)
 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  Stable at this time.  - Outpatient follow-up with oncology to establish etiology

## 2023-11-16 NOTE — H&P (Signed)
 History and Physical    Patient: [name] [name] WUJ:[id number] DOB: 20-Nov-1940 DOA: 11/16/2023 DOS: the patient was seen and examined on 11/16/2023 PCP: Kennis Carina, MD  Patient coming from: Home  Chief Complaint:  Chief Complaint  Patient presents with   Code Stroke   HPI: [name] [name] is a 83 y.o. 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  History obtained predominantly from patient's family at bedside due to patient's difficulty speaking.  His son Thayer Ohm states that he checked on [name] [name] at approximately 9-9:30 AM and he seemed at his baseline.  Then his brother went to go see him him again at approximately 2 PM and noted that he had a severe right-sided facial droop with drooling out the side of his mouth and was unable to speak coherently.  Due to this, an ambulance was called.  Thayer Ohm states that 3-4 months ago, patient had an episode of vomiting with inability to walk due to bilateral lower extremity weakness.  He slowly recovered over 3-4 weeks, but wonders if this was also a stroke.  Thayer Ohm also notes that patient has not been taking any of his home medications and has not seen any physician in over 2 years.  [name] [name] denies any recent illness, nausea, vomiting, chest pain, shortness of breath, palpitations.  ED course: On arrival to the ED, patient was hypertensive at 176/92 with heart rate of 89.  He was saturating at 96% on room air.  He was afebrile. Initial workup notable for WBC at 12.1, platelets 689, potassium 3.2, glucose 184, creatinine 0.89 with GFR above 60.  CT head with no acute infarct.  CTA head/neck with no LVO.  Neurology consulted.  [facility] contacted for admission.  Review of Systems: As mentioned in the history of present illness. All other systems reviewed  and are negative.  Past Medical History:  Diagnosis Date   Arthritis    Depression    Diabetes mellitus without complication (HCC)    Gout    Hyperlipidemia    Hypertension    Ischemic cardiomyopathy    No past surgical history on file.  Social History:  reports that he quit smoking about 34 years ago. His smoking use included cigarettes. He has never used smokeless tobacco. He reports current alcohol use of about 3.0 standard drinks of alcohol per week. He reports that he does not use drugs.  No Known Allergies  Family History  Problem Relation Age of Onset   Diabetes Sister     Prior to Admission medications   Medication Sig Start Date End Date Taking? Authorizing Provider  ASPIRIN 81 PO Take 1 tablet by mouth daily.    [provider]  atenolol (TENORMIN) 50 MG tablet Take 100 mg by mouth daily. 12/06/20   [provider]  atorvastatin (LIPITOR) 80 MG tablet Take 1 tablet by mouth daily. 12/08/20   [provider]  colchicine 0.6 MG tablet PLEASE SEE ATTACHED FOR DETAILED DIRECTIONS 10/07/20   [provider]  diclofenac Sodium (VOLTAREN) 1 % GEL APPLY 2 GRAMS TO AFFECTED AREA 4 TIMES A DAY 10/13/20   [provider]  diltiazem (CARDIZEM CD) 120 MG 24 hr capsule Take 120 mg by mouth daily. 12/09/20   [provider]  FLUoxetine (PROZAC) 20 MG capsule Take 20 mg by mouth daily. 11/29/20  [provider]  hydrochlorothiazide (HYDRODIURIL) 12.5 MG tablet Take 12.5 mg by mouth daily. 09/08/20   [provider]  lisinopril (ZESTRIL) 10 MG tablet Take 1 tablet by mouth daily. 11/29/20   [provider]  nitroGLYCERIN (NITROSTAT) 0.4 MG SL tablet PLEASE SEE ATTACHED FOR DETAILED DIRECTIONS 09/02/20   [provider]  terbinafine (LAMISIL) 250 MG tablet Take 250 mg by mouth daily. 12/16/20   [provider]    Physical Exam: Vitals:   11/16/23 1600 11/16/23 1630 11/16/23 1700 11/16/23 1734  BP: (!)  176/92 (!) 163/81 (!) 158/81 (!) 158/80  Pulse: 88 86 90 85  Resp: (!) 24 (!) 21 19 20   Temp:    97.8 F (36.6 C)  SpO2: 96% 92% 98% 95%   Physical Exam Vitals and nursing note reviewed.  Constitutional:      General: He is not in acute distress.    Appearance: He is normal weight.  HENT:     Head: Normocephalic and atraumatic.     Mouth/Throat:     Mouth: Mucous membranes are moist.     Pharynx: Oropharynx is clear.  Eyes:     Extraocular Movements: Extraocular movements intact.     Pupils: Pupils are equal, round, and reactive to light.  Cardiovascular:     Rate and Rhythm: Normal rate and regular rhythm.     Heart sounds: No murmur heard.    No gallop.  Pulmonary:     Effort: Pulmonary effort is normal. No respiratory distress.     Breath sounds: Normal breath sounds. No wheezing, rhonchi or rales.  Abdominal:     General: Bowel sounds are normal. There is no distension.     Palpations: Abdomen is soft.     Tenderness: There is no abdominal tenderness. There is no guarding.  Musculoskeletal:     Right lower leg: No edema.     Left lower leg: No edema.  Skin:    General: Skin is warm and dry.  Neurological:     Mental Status: He is alert.     Comments:  Notable right-sided facial droop with dysarthric speech.  No expressive aphasia. No focal extremity weakness Normal FTN bilaterally Sensation grossly intact throughout No tremor  Psychiatric:        Mood and Affect: Mood normal.        Behavior: Behavior normal.    Data Reviewed: CBC with WBC of 12.1, hemoglobin of 15.6, MCV of 87.5, platelets of 69 CMP with sodium of 138, potassium 3.2, bicarb 21, glucose 184, BUN 15, creatinine 0.89, AST 20, ALT 13, GFR above 60 INR 1.1 PTT 34 Alcohol level negative  EKG personally reviewed.  Sinus rhythm with rate of 82.  T wave inversion throughout.  No acute ischemic changes.  CT ANGIO HEAD NECK W WO CM (CODE STROKE) Result Date: 11/16/2023 CLINICAL DATA:  Code stroke,  neuro deficit, right-sided facial droop, mild aphasia, left gaze preference. EXAM: CT ANGIOGRAPHY HEAD AND NECK WITH AND WITHOUT CONTRAST TECHNIQUE: Multidetector CT imaging of the head and neck was performed using the standard protocol during bolus administration of intravenous contrast. Multiplanar CT image reconstructions and MIPs were obtained to evaluate the vascular anatomy. Carotid stenosis measurements (when applicable) are obtained utilizing NASCET criteria, using the distal internal carotid diameter as the denominator. RADIATION DOSE REDUCTION: This exam was performed according to the departmental dose-optimization program which includes automated exposure control, adjustment of the mA and/or kV according to patient size and/or use of  iterative reconstruction technique. CONTRAST:  75mL OMNIPAQUE IOHEXOL 350 MG/ML SOLN COMPARISON:  Same-day head CT. FINDINGS: CTA NECK FINDINGS Aortic arch: Standard configuration of the aortic arch. Imaged portion shows no evidence of aneurysm or dissection. No significant stenosis of the major arch vessel origins. Pulmonary arteries: As permitted by contrast timing, there are no filling defects in the visualized pulmonary arteries. Subclavian arteries: The subclavian arteries are patent bilaterally. Right carotid system: Patent. Mild atherosclerosis at the carotid bifurcation without hemodynamically significant stenosis. No evidence of dissection. Tortuosity of the mid cervical ICA. Left carotid system: Patent. Mild atherosclerosis at the carotid bifurcation without hemodynamically significant stenosis. No evidence of dissection. Vertebral arteries: Right vertebral artery is dominant. The right vertebral artery is patent from the origin to the vertebrobasilar confluence. Atherosclerosis of the right V4 segment resulting in mild-to-moderate stenosis. There is atherosclerosis at the origin of the non dominant left vertebral artery with limited evaluation of stenosis which is  at least moderate in severity. The left vertebral artery is patent to the proximal V3 segment. The vertebral artery is occluded from this level to the vertebrobasilar confluence. Prominent focal atherosclerosis along the distal left V4 segment. Skeleton: No acute findings. Degenerative changes in the cervical spine. Intervertebral disc space narrowing greatest at C5-6 through C7-T1. Edentulous maxilla and mandible. Mucosal thickening in the right maxillary sinus with findings suggestive of mucoperiosteal reaction. Other neck: The visualized airway is patent. No cervical lymphadenopathy. Subcentimeter thyroid nodules. Upper chest: Centrilobular emphysema in the visualized upper lobes with additional paraseptal emphysema in the lung apices most pronounced on the right. Review of the MIP images confirms the above findings CTA HEAD FINDINGS ANTERIOR CIRCULATION: The intracranial ICAs are patent bilaterally. Atherosclerosis of the bilateral carotid siphons. There is mild stenosis of the right supraclinoid ICA. No high-grade stenosis, proximal occlusion, aneurysm, or vascular malformation. MCAs: The right M1 segment is patent. There is severe stenosis at the origin of a M2 superior division branch. Short-segment occlusion of an M2 inferior division branch with reconstitution within the sylvian fissure. The left M1 segment is patent. There is mild narrowing of an M2 inferior division branch. Severe stenosis and subtotal occlusion of an M2 inferior division branch of the left MCA with additional multifocal severe stenosis of M3 and M4 branches within this distribution. ACAs: The A1 and A2 segments are patent bilaterally. Moderate stenosis of the bilateral A3 segments. Additional focal severe stenosis of the left A4 segment. POSTERIOR CIRCULATION: PCAs: Patent bilaterally. Focal severe stenosis of the proximal P3 segment right PCA. Additional severe stenosis of the posterior P2 segment and proximal P3 segment of the left  PCA. Pcomm: Visualized on the right. SCAs: The superior cerebellar arteries are patent bilaterally. Basilar artery: Patent AICAs: Patent PICAs: Visualized on the right. Vertebral arteries: Patent on the right. Venous sinuses: As permitted by contrast timing, patent. Anatomic variants: None Review of the MIP images confirms the above findings IMPRESSION: No large vessel occlusion or evidence of findings amenable to neurovascular intervention. 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 Multiple intracranial vascular stenoses as above. Focal short segment occlusion of an M2 inferior division branch of the right MCA with reconstitution noted. Mild-to-moderate stenosis of the V4 segment right vertebral artery. Emphysema (ICD10-J43.9). Electronically Signed   By: Emily Filbert M.D.   On: 11/16/2023 15:50   CT HEAD CODE STROKE WO CONTRAST Result Date: 11/16/2023 CLINICAL DATA:  Code  stroke. Right facial droop and slurred speech. Last known well at 9:15 a.m. EXAM: CT HEAD WITHOUT CONTRAST TECHNIQUE: Contiguous axial images were obtained from the base of the skull through the vertex without intravenous contrast. RADIATION DOSE REDUCTION: This exam was performed according to the departmental dose-optimization program which includes automated exposure control, adjustment of the mA and/or kV according to patient size and/or use of iterative reconstruction technique. COMPARISON:  None Available. FINDINGS: Brain: Moderate atrophy and white matter changes are present bilaterally. Age indeterminate infarcts are present in the anterior thalami bilaterally, more prominent right than left. Subtle hypoattenuation is present at the anterior limb of the right internal capsule. A subcortical white matter infarct is present in the anterior right frontal lobe superior to the frontal horn of the right lateral ventricle. An age  indeterminate infarct is present in the left lentiform nucleus. No other acute cortical infarcts are present. No acute hemorrhage or mass lesion is present. No significant extra-axial fluid collections are present. The ventricles are proportionate to the degree of atrophy. The brainstem and cerebellum are within normal limits. Midline structures are within normal limits. Vascular: Atherosclerotic calcifications are present at the cavernous internal carotid arteries and at the dural margin of both vertebral arteries. No hyperdense vessel is present. Skull: Calvarium is intact. No focal lytic or blastic lesions are present. No significant extracranial soft tissue lesion is present. Sinuses/Orbits: The paranasal sinuses and mastoid air cells are clear. The globes and orbits are within normal limits. ASPECTS [facility] Stroke Program Early CT Score) - Ganglionic level infarction (caudate, lentiform nuclei, internal capsule, insula, M1-M3 cortex): 6/7 - Supraganglionic infarction (M4-M6 cortex): 3/3 Total score (0-10 with 10 being normal): 9/10 IMPRESSION: 1. 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 2. Subtle hypoattenuation at the anterior limb of the right internal capsule. 3. 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 4. Age indeterminate infarct in the left lentiform nucleus. 5. Moderate atrophy and white matter disease likely reflects the sequela of chronic microvascular ischemia. 6. No acute hemorrhage or mass lesion. These results were called by telephone at the time of interpretation on 11/16/2023 at 3:13 pm to provider Dr. Selina Cooley, who verbally acknowledged these results. Electronically Signed   By: Marin Roberts M.D.   On: 11/16/2023 15:14   Results are pending, will review when available.  Assessment and Plan:  * Acute CVA (cerebrovascular accident) [facility]) Patient  is presenting with right-sided facial droop, right weakness and slurred speech with symptoms beginning sometime between 9:30 AM and 2 PM today.  No evidence of LVO on imaging and outside of the window for TNK.  - Neurology consulted; appreciate their recommendations - MRI brain pending - Telemetry monitoring - Allow for permissive HTN (systolic < 220 and diastolic < 120) - Echocardiogram  - A1C and Lipid panel  - Start aspirin pending swallow study - Start high intensity statin pending swallow study - PT/OT/SLP  Thrombocytosis Per chart review, patient has a history of chronic thrombocytosis of unknown etiology dating back to at least 2016.  Stable at this time.  - Outpatient follow-up with oncology to establish etiology  Ischemic cardiomyopathy Per chart review, patient has a history of ischemic cardiomyopathy with last ultrasound available 2019 that demonstrated mildly reduced LVEF at 45%.  No follow-up since then.  - Echocardiogram pending  Coronary artery disease Per chart review, patient has a history of CAD s/p  bare-metal stent to mid LAD in 2012.  Last follow-up with cardiology was in 2019.  - Restart aspirin and statin once he passes swallow study  Type 2 diabetes mellitus (HCC) History of type 2 diabetes with last A1c available in 2023, at 6.1%.  - A1c pending - SSI, moderate  Advance Care Planning:   Code Status: Full Code Confirmed by patient with family at bedside.   Consults: Neurology  Family Communication: Patient's daughter and son updated at bedside.   Severity of Illness: The appropriate patient status for this patient is OBSERVATION. Observation status is judged to be reasonable and necessary in order to provide the required intensity of service to ensure the patient's safety. The patient's presenting symptoms, physical exam findings, and initial radiographic and laboratory data in the context of their medical condition is felt to place them at decreased  risk for further clinical deterioration. Furthermore, it is anticipated that the patient will be medically stable for discharge from the hospital within 2 midnights of admission.   Author: Verdene Lennert, MD 11/16/2023 6:22 PM  For on call review [email or web address].

## 2023-11-16 NOTE — ED Triage Notes (Signed)
 Pt arrives via EMS from home for R facial droop and slurred speech. Last time someone saw patient normal was 915am. When they returned to him at 2pm he exhibited stroke like sx. Neuro at bedside upon pt arrival.

## 2023-11-16 NOTE — Progress Notes (Addendum)
   11/16/23 1600  Spiritual Encounters  Type of Visit Initial  Care provided to: Pt and family  Referral source Code page  Reason for visit Code  OnCall Visit Yes  Spiritual Framework  Community/Connection Family  Interventions  Spiritual Care Interventions Made Established relationship of care and support;Compassionate presence  Intervention Outcomes  Outcomes Connection to spiritual care   Chaplain spoke with the family. Family shared that pt. Does not like Hospital and maybe this was a good opportunity to see what is really going on with patient's health. Chaplain was able to get a warm blanket for pt.and show compassionate presence.

## 2023-11-16 NOTE — Assessment & Plan Note (Signed)
 History of type 2 diabetes with last A1c available in 2023, at 6.1%.  - A1c pending - SSI, moderate

## 2023-11-16 NOTE — ED Notes (Signed)
Dr. Basaraba at bedside 

## 2023-11-16 NOTE — Assessment & Plan Note (Signed)
 Per chart review, patient has a history of ischemic cardiomyopathy with last ultrasound available 2019 that demonstrated mildly reduced LVEF at 45%.  No follow-up since then.  - Echocardiogram pending

## 2023-11-17 ENCOUNTER — Inpatient Hospital Stay: Attending: Cardiology

## 2023-11-17 ENCOUNTER — Observation Stay (HOSPITAL_COMMUNITY)
Admit: 2023-11-17 | Discharge: 2023-11-17 | Disposition: A | Discharge status: Home / Self Care | Attending: Internal Medicine | Admitting: Internal Medicine

## 2023-11-17 ENCOUNTER — Other Ambulatory Visit: Payer: Self-pay | Admitting: Cardiology

## 2023-11-17 DIAGNOSIS — I639 Cerebral infarction, unspecified: Secondary | ICD-10-CM | POA: Diagnosis not present

## 2023-11-17 DIAGNOSIS — Z79899 Other long term (current) drug therapy: Secondary | ICD-10-CM | POA: Diagnosis not present

## 2023-11-17 DIAGNOSIS — F32A Depression, unspecified: Secondary | ICD-10-CM | POA: Diagnosis present

## 2023-11-17 DIAGNOSIS — M109 Gout, unspecified: Secondary | ICD-10-CM | POA: Diagnosis present

## 2023-11-17 DIAGNOSIS — Z87891 Personal history of nicotine dependence: Secondary | ICD-10-CM | POA: Diagnosis not present

## 2023-11-17 DIAGNOSIS — E876 Hypokalemia: Secondary | ICD-10-CM | POA: Diagnosis present

## 2023-11-17 DIAGNOSIS — I63512 Cerebral infarction due to unspecified occlusion or stenosis of left middle cerebral artery: Secondary | ICD-10-CM | POA: Diagnosis present

## 2023-11-17 DIAGNOSIS — E559 Vitamin D deficiency, unspecified: Secondary | ICD-10-CM | POA: Diagnosis present

## 2023-11-17 DIAGNOSIS — R130 Aphagia: Secondary | ICD-10-CM | POA: Diagnosis present

## 2023-11-17 DIAGNOSIS — R7989 Other specified abnormal findings of blood chemistry: Secondary | ICD-10-CM | POA: Diagnosis not present

## 2023-11-17 DIAGNOSIS — I10 Essential (primary) hypertension: Secondary | ICD-10-CM | POA: Diagnosis not present

## 2023-11-17 DIAGNOSIS — I255 Ischemic cardiomyopathy: Secondary | ICD-10-CM | POA: Diagnosis present

## 2023-11-17 DIAGNOSIS — E639 Nutritional deficiency, unspecified: Secondary | ICD-10-CM | POA: Diagnosis present

## 2023-11-17 DIAGNOSIS — Z794 Long term (current) use of insulin: Secondary | ICD-10-CM | POA: Diagnosis not present

## 2023-11-17 DIAGNOSIS — R29810 Facial weakness: Secondary | ICD-10-CM | POA: Diagnosis present

## 2023-11-17 DIAGNOSIS — I2510 Atherosclerotic heart disease of native coronary artery without angina pectoris: Secondary | ICD-10-CM | POA: Diagnosis present

## 2023-11-17 DIAGNOSIS — R29713 NIHSS score 13: Secondary | ICD-10-CM | POA: Diagnosis present

## 2023-11-17 DIAGNOSIS — R471 Dysarthria and anarthria: Secondary | ICD-10-CM | POA: Diagnosis present

## 2023-11-17 DIAGNOSIS — F05 Delirium due to known physiological condition: Secondary | ICD-10-CM | POA: Diagnosis not present

## 2023-11-17 DIAGNOSIS — I110 Hypertensive heart disease with heart failure: Secondary | ICD-10-CM | POA: Diagnosis present

## 2023-11-17 DIAGNOSIS — I6389 Other cerebral infarction: Secondary | ICD-10-CM

## 2023-11-17 DIAGNOSIS — E119 Type 2 diabetes mellitus without complications: Secondary | ICD-10-CM | POA: Diagnosis present

## 2023-11-17 DIAGNOSIS — D75839 Thrombocytosis, unspecified: Secondary | ICD-10-CM | POA: Diagnosis present

## 2023-11-17 DIAGNOSIS — N179 Acute kidney failure, unspecified: Secondary | ICD-10-CM | POA: Diagnosis not present

## 2023-11-17 DIAGNOSIS — K5901 Slow transit constipation: Secondary | ICD-10-CM | POA: Diagnosis not present

## 2023-11-17 DIAGNOSIS — I5042 Chronic combined systolic (congestive) and diastolic (congestive) heart failure: Secondary | ICD-10-CM | POA: Diagnosis present

## 2023-11-17 DIAGNOSIS — E785 Hyperlipidemia, unspecified: Secondary | ICD-10-CM | POA: Diagnosis present

## 2023-11-17 DIAGNOSIS — G8191 Hemiplegia, unspecified affecting right dominant side: Secondary | ICD-10-CM | POA: Diagnosis present

## 2023-11-17 DIAGNOSIS — I69320 Aphasia following cerebral infarction: Secondary | ICD-10-CM | POA: Diagnosis not present

## 2023-11-17 DIAGNOSIS — E1169 Type 2 diabetes mellitus with other specified complication: Secondary | ICD-10-CM | POA: Diagnosis not present

## 2023-11-17 DIAGNOSIS — Z955 Presence of coronary angioplasty implant and graft: Secondary | ICD-10-CM | POA: Diagnosis not present

## 2023-11-17 DIAGNOSIS — E663 Overweight: Secondary | ICD-10-CM | POA: Diagnosis present

## 2023-11-17 LAB — CBC WITH DIFFERENTIAL/PLATELET
Abs Immature Granulocytes: 0.04 10*3/uL (ref 0.00–0.07)
Basophils Absolute: 0 10*3/uL (ref 0.0–0.1)
Basophils Relative: 0 %
Eosinophils Absolute: 0 10*3/uL (ref 0.0–0.5)
Eosinophils Relative: 0 %
HCT: 41.6 % (ref 39.0–52.0)
Hemoglobin: 14.4 g/dL (ref 13.0–17.0)
Immature Granulocytes: 1 %
Lymphocytes Relative: 6 %
Lymphs Abs: 0.5 10*3/uL — ABNORMAL LOW (ref 0.7–4.0)
MCH: 29.9 pg (ref 26.0–34.0)
MCHC: 34.6 g/dL (ref 30.0–36.0)
MCV: 86.5 fL (ref 80.0–100.0)
Monocytes Absolute: 0.5 10*3/uL (ref 0.1–1.0)
Monocytes Relative: 6 %
Neutro Abs: 7.5 10*3/uL (ref 1.7–7.7)
Neutrophils Relative %: 87 %
Platelets: 498 10*3/uL — ABNORMAL HIGH (ref 150–400)
RBC: 4.81 MIL/uL (ref 4.22–5.81)
RDW: 13.6 % (ref 11.5–15.5)
WBC: 8.6 10*3/uL (ref 4.0–10.5)
nRBC: 0 % (ref 0.0–0.2)

## 2023-11-17 LAB — BASIC METABOLIC PANEL WITH GFR
Anion gap: 8 (ref 5–15)
BUN: 16 mg/dL (ref 8–23)
CO2: 28 mmol/L (ref 22–32)
Calcium: 8.6 mg/dL — ABNORMAL LOW (ref 8.9–10.3)
Chloride: 103 mmol/L (ref 98–111)
Creatinine, Ser: 0.98 mg/dL (ref 0.61–1.24)
GFR, Estimated: >60 mL/min (ref >60)
Glucose, Bld: 134 mg/dL — ABNORMAL HIGH (ref 70–99)
Potassium: 3.6 mmol/L (ref 3.5–5.1)
Sodium: 139 mmol/L (ref 135–145)

## 2023-11-17 LAB — HEMOGLOBIN A1C
Hgb A1c MFr Bld: 5.8 % — ABNORMAL HIGH (ref 4.8–5.6)
Mean Plasma Glucose: 120 mg/dL

## 2023-11-17 LAB — ECHOCARDIOGRAM COMPLETE
AR max vel: 2.39 cm2
AV Area VTI: 2.44 cm2
AV Area mean vel: 2.37 cm2
AV Mean grad: 3 mmHg
AV Peak grad: 6.7 mmHg
Ao pk vel: 1.29 m/s
Area-P 1/2: 3.36 cm2
Height: 72 in
MV VTI: 2.43 cm2
S' Lateral: 3 cm
Weight: 3252.23 [oz_av]

## 2023-11-17 LAB — LIPID PANEL
Cholesterol: 155 mg/dL (ref 0–200)
HDL: 41 mg/dL (ref >40)
LDL Cholesterol: 100 mg/dL — ABNORMAL HIGH (ref 0–99)
Total CHOL/HDL Ratio: 3.8 ratio
Triglycerides: 69 mg/dL (ref <150)
VLDL: 14 mg/dL (ref 0–40)

## 2023-11-17 LAB — MAGNESIUM: Magnesium: 2.1 mg/dL (ref 1.7–2.4)

## 2023-11-17 LAB — PHOSPHORUS: Phosphorus: 2.7 mg/dL (ref 2.5–4.6)

## 2023-11-17 LAB — VITAMIN D 25 HYDROXY (VIT D DEFICIENCY, FRACTURES): Vit D, 25-Hydroxy: 25.86 ng/mL — ABNORMAL LOW (ref 30–100)

## 2023-11-17 LAB — TSH: TSH: 0.666 u[IU]/mL (ref 0.350–4.500)

## 2023-11-17 LAB — GLUCOSE, CAPILLARY
Glucose-Capillary: 114 mg/dL — ABNORMAL HIGH (ref 70–99)
Glucose-Capillary: 109 mg/dL — ABNORMAL HIGH (ref 70–99)
Glucose-Capillary: 119 mg/dL — ABNORMAL HIGH (ref 70–99)
Glucose-Capillary: 118 mg/dL — ABNORMAL HIGH (ref 70–99)

## 2023-11-17 LAB — T4, FREE: Free T4: 1.07 ng/dL (ref 0.61–1.12)

## 2023-11-17 MED ORDER — CLOPIDOGREL BISULFATE 75 MG PO TABS
75.0000 mg | ORAL_TABLET | Freq: Every day | ORAL | Status: DC
  Administered 2023-11-17 – 2023-11-19 (×3): 75 mg via ORAL
  Filled 2023-11-17 (×3): qty 1

## 2023-11-17 MED ORDER — MELATONIN 5 MG PO TABS
5.0000 mg | ORAL_TABLET | Freq: Once | ORAL | Status: DC
  Filled 2023-11-17 (×2): qty 1

## 2023-11-17 MED ORDER — ASPIRIN 325 MG PO TBEC
325.0000 mg | DELAYED_RELEASE_TABLET | Freq: Once | ORAL | Status: AC
  Administered 2023-11-17: 325 mg via ORAL
  Filled 2023-11-17: qty 1

## 2023-11-17 MED ORDER — ASPIRIN 81 MG PO TBEC
81.0000 mg | DELAYED_RELEASE_TABLET | Freq: Every day | ORAL | Status: DC
  Administered 2023-11-18 – 2023-11-19 (×2): 81 mg via ORAL
  Filled 2023-11-17 (×2): qty 1

## 2023-11-17 NOTE — Plan of Care (Signed)
 Neurology plan of care  Please see neurology note from 11/16/23 for full findings and interim recommendations.  Results of additional stroke workup:  CTA H&N  No large vessel occlusion or evidence of findings amenable to neurovascular intervention.   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   Multiple intracranial vascular stenoses as above. Focal short segment occlusion of an M2 inferior division branch of the right MCA with reconstitution noted.   Mild-to-moderate stenosis of the V4 segment right vertebral artery.   Emphysema (ICD10-J43.9).  MRI brain  1. Small acute nonhemorrhagic left MCA distribution infarct involving the left frontal lobe. No associated mass effect. 2. Additional punctate subcentimeter acute ischemic nonhemorrhagic infarct within the contralateral right basal ganglia. 3. Underlying moderately advanced chronic microvascular ischemic disease with a few scattered remote lacunar infarcts as above. 4. Chronic occlusion of the left vertebral artery.  All CNS imaging personally reviewed  TTE  1. Left ventricular ejection fraction, by estimation, is 40 to 45% . The left ventricle has mildly decreased function. The left ventricle demonstrates regional wall motion abnormalities ( hypokinesis of the anteroseptal, anterior and apical region with aneurysmal region in the distal anteroseptal/ anterior and apical region) . There is moderate left ventricular hypertrophy. Left ventricular diastolic parameters are consistent with Grade I diastolic dysfunction ( impaired relaxation) . 2. Right ventricular systolic function is normal. The right ventricular size is normal. 3. The mitral valve is normal in structure. No evidence of mitral valve regurgitation. No evidence of mitral stenosis. 4. The aortic valve is calcified. There is mild calcification of the aortic valve.  Aortic valve regurgitation is not visualized. Aortic valve sclerosis/ calcification is present, without any evidence of aortic stenosis. 5. The inferior vena cava is normal in size with greater than 50% respiratory variability, suggesting right atrial pressure of 3 mmHg.  Stroke Labs     Component Value Date/Time   CHOL 155 11/17/2023 0511   TRIG 69 11/17/2023 0511   HDL 41 11/17/2023 0511   CHOLHDL 3.8 11/17/2023 0511   VLDL 14 11/17/2023 0511   LDLCALC 100 (H) 11/17/2023 0511    Lab Results  Component Value Date/Time   HGBA1C 5.8 (H) 11/16/2023 03:00 PM   Final recommendations: - Etiology of stroke is favored to be central embolic due to involvement of multiple vascular territories. TEE not performed 2/2 patient's age. Cardiology has arranged for ambulatory cardiac monitoring and f/u. - ASA 81mg  daily + plavix 75mg  daily x21 days f/b plavix 75mg  daily monotherapy after that - Continue atorvastatin 80mg  daily - Patient being evaluated for CIR - I will arrange outpatient neurology f/u  Neurology to sign off, please re-engage if additional neurologic concerns arise.  Greg Leaks, MD [facility] [phone]  If 7pm- 7am, please page neurology on call as listed in AMION.

## 2023-11-17 NOTE — Plan of Care (Signed)
 Problem: Education: Goal: Knowledge of General Education information will improve Description: Including pain rating scale, medication(s)/side effects and non-pharmacologic comfort measures 11/17/2023 0426 by Delphina Cahill, RN Outcome: Progressing 11/17/2023 0425 by Delphina Cahill, RN Outcome: Progressing 11/17/2023 0425 by Delphina Cahill, RN Outcome: Progressing   Problem: Health Behavior/Discharge Planning: Goal: Ability to manage health-related needs will improve 11/17/2023 0426 by Delphina Cahill, RN Outcome: Progressing 11/17/2023 0425 by Delphina Cahill, RN Outcome: Progressing 11/17/2023 0425 by Delphina Cahill, RN Outcome: Progressing   Problem: Clinical Measurements: Goal: Ability to maintain clinical measurements within normal limits will improve 11/17/2023 0426 by Delphina Cahill, RN Outcome: Progressing 11/17/2023 0425 by Delphina Cahill, RN Outcome: Progressing 11/17/2023 0425 by Delphina Cahill, RN Outcome: Progressing Goal: Will remain free from infection 11/17/2023 0426 by Delphina Cahill, RN Outcome: Progressing 11/17/2023 0425 by Delphina Cahill, RN Outcome: Progressing 11/17/2023 0425 by Delphina Cahill, RN Outcome: Progressing Goal: Diagnostic test results will improve 11/17/2023 0426 by Delphina Cahill, RN Outcome: Progressing 11/17/2023 0425 by Delphina Cahill, RN Outcome: Progressing 11/17/2023 0425 by Delphina Cahill, RN Outcome: Progressing Goal: Respiratory complications will improve 11/17/2023 0426 by Delphina Cahill, RN Outcome: Progressing 11/17/2023 0425 by Delphina Cahill, RN Outcome: Progressing 11/17/2023 0425 by Delphina Cahill, RN Outcome: Progressing Goal: Cardiovascular complication will be avoided 11/17/2023 0426 by Delphina Cahill, RN Outcome: Progressing 11/17/2023 0425 by Delphina Cahill, RN Outcome: Progressing 11/17/2023 0425 by Delphina Cahill, RN Outcome: Progressing   Problem: Activity: Goal: Risk for activity intolerance will decrease 11/17/2023 0426 by Delphina Cahill, RN Outcome: Progressing 11/17/2023 0425 by Delphina Cahill,  RN Outcome: Progressing 11/17/2023 0425 by Delphina Cahill, RN Outcome: Progressing   Problem: Nutrition: Goal: Adequate nutrition will be maintained 11/17/2023 0426 by Delphina Cahill, RN Outcome: Progressing 11/17/2023 0425 by Delphina Cahill, RN Outcome: Progressing 11/17/2023 0425 by Delphina Cahill, RN Outcome: Progressing   Problem: Coping: Goal: Level of anxiety will decrease 11/17/2023 0426 by Delphina Cahill, RN Outcome: Progressing 11/17/2023 0425 by Delphina Cahill, RN Outcome: Progressing 11/17/2023 0425 by Delphina Cahill, RN Outcome: Progressing   Problem: Elimination: Goal: Will not experience complications related to bowel motility 11/17/2023 0426 by Delphina Cahill, RN Outcome: Progressing 11/17/2023 0425 by Delphina Cahill, RN Outcome: Progressing 11/17/2023 0425 by Delphina Cahill, RN Outcome: Progressing Goal: Will not experience complications related to urinary retention 11/17/2023 0426 by Delphina Cahill, RN Outcome: Progressing 11/17/2023 0425 by Delphina Cahill, RN Outcome: Progressing 11/17/2023 0425 by Delphina Cahill, RN Outcome: Progressing   Problem: Pain Managment: Goal: General experience of comfort will improve and/or be controlled 11/17/2023 0426 by Delphina Cahill, RN Outcome: Progressing 11/17/2023 0425 by Delphina Cahill, RN Outcome: Progressing 11/17/2023 0425 by Delphina Cahill, RN Outcome: Progressing   Problem: Safety: Goal: Ability to remain free from injury will improve 11/17/2023 0426 by Delphina Cahill, RN Outcome: Progressing 11/17/2023 0425 by Delphina Cahill, RN Outcome: Progressing 11/17/2023 0425 by Delphina Cahill, RN Outcome: Progressing   Problem: Skin Integrity: Goal: Risk for impaired skin integrity will decrease 11/17/2023 0426 by Delphina Cahill, RN Outcome: Progressing 11/17/2023 0425 by Delphina Cahill, RN Outcome: Progressing 11/17/2023 0425 by Delphina Cahill, RN Outcome: Progressing   Problem: Education: Goal: Knowledge of disease or condition will improve 11/17/2023 0426 by Delphina Cahill, RN Outcome:  Progressing 11/17/2023 0425 by Delphina Cahill, RN Outcome: Progressing 11/17/2023 0425 by Delphina Cahill, RN Outcome: Progressing Goal: Knowledge of secondary prevention will improve (MUST DOCUMENT ALL) 11/17/2023 0426 by Delphina Cahill, RN Outcome: Progressing 11/17/2023 0425 by Delphina Cahill, RN Outcome: Progressing 11/17/2023 0425 by Delphina Cahill, RN Outcome: Progressing  Goal: Knowledge of patient specific risk factors will improve (DELETE if not current risk factor) 11/17/2023 0426 by Delphina Cahill, RN Outcome: Progressing 11/17/2023 0425 by Delphina Cahill, RN Outcome: Progressing 11/17/2023 0425 by Delphina Cahill, RN Outcome: Progressing   Problem: Ischemic Stroke/TIA Tissue Perfusion: Goal: Complications of ischemic stroke/TIA will be minimized 11/17/2023 0426 by Delphina Cahill, RN Outcome: Progressing 11/17/2023 0425 by Delphina Cahill, RN Outcome: Progressing 11/17/2023 0425 by Delphina Cahill, RN Outcome: Progressing   Problem: Coping: Goal: Will verbalize positive feelings about self 11/17/2023 0426 by Delphina Cahill, RN Outcome: Progressing 11/17/2023 0425 by Delphina Cahill, RN Outcome: Progressing 11/17/2023 0425 by Delphina Cahill, RN Outcome: Progressing Goal: Will identify appropriate support needs 11/17/2023 0426 by Delphina Cahill, RN Outcome: Progressing 11/17/2023 0425 by Delphina Cahill, RN Outcome: Progressing 11/17/2023 0425 by Delphina Cahill, RN Outcome: Progressing   Problem: Health Behavior/Discharge Planning: Goal: Ability to manage health-related needs will improve 11/17/2023 0426 by Delphina Cahill, RN Outcome: Progressing 11/17/2023 0425 by Delphina Cahill, RN Outcome: Progressing 11/17/2023 0425 by Delphina Cahill, RN Outcome: Progressing Goal: Goals will be collaboratively established with patient/family 11/17/2023 0426 by Delphina Cahill, RN Outcome: Progressing 11/17/2023 0425 by Delphina Cahill, RN Outcome: Progressing 11/17/2023 0425 by Delphina Cahill, RN Outcome: Progressing   Problem: Self-Care: Goal: Ability to participate in self-care  as condition permits will improve 11/17/2023 0426 by Delphina Cahill, RN Outcome: Progressing 11/17/2023 0425 by Delphina Cahill, RN Outcome: Progressing 11/17/2023 0425 by Delphina Cahill, RN Outcome: Progressing Goal: Verbalization of feelings and concerns over difficulty with self-care will improve 11/17/2023 0426 by Delphina Cahill, RN Outcome: Progressing 11/17/2023 0425 by Delphina Cahill, RN Outcome: Progressing 11/17/2023 0425 by Delphina Cahill, RN Outcome: Progressing Goal: Ability to communicate needs accurately will improve 11/17/2023 0426 by Delphina Cahill, RN Outcome: Progressing 11/17/2023 0425 by Delphina Cahill, RN Outcome: Progressing 11/17/2023 0425 by Delphina Cahill, RN Outcome: Progressing   Problem: Nutrition: Goal: Risk of aspiration will decrease 11/17/2023 0426 by Delphina Cahill, RN Outcome: Progressing 11/17/2023 0425 by Delphina Cahill, RN Outcome: Progressing 11/17/2023 0425 by Delphina Cahill, RN Outcome: Progressing Goal: Dietary intake will improve 11/17/2023 0426 by Delphina Cahill, RN Outcome: Progressing 11/17/2023 0425 by Delphina Cahill, RN Outcome: Progressing 11/17/2023 0425 by Delphina Cahill, RN Outcome: Progressing   Problem: Education: Goal: Ability to describe self-care measures that may prevent or decrease complications (Diabetes Survival Skills Education) will improve 11/17/2023 0426 by Delphina Cahill, RN Outcome: Progressing 11/17/2023 0425 by Delphina Cahill, RN Outcome: Progressing 11/17/2023 0425 by Delphina Cahill, RN Outcome: Progressing Goal: Individualized Educational Video(s) 11/17/2023 0426 by Delphina Cahill, RN Outcome: Progressing 11/17/2023 0425 by Delphina Cahill, RN Outcome: Progressing 11/17/2023 0425 by Delphina Cahill, RN Outcome: Progressing   Problem: Coping: Goal: Ability to adjust to condition or change in health will improve 11/17/2023 0426 by Delphina Cahill, RN Outcome: Progressing 11/17/2023 0425 by Delphina Cahill, RN Outcome: Progressing 11/17/2023 0425 by Delphina Cahill, RN Outcome: Progressing   Problem: Fluid  Volume: Goal: Ability to maintain a balanced intake and output will improve 11/17/2023 0426 by Delphina Cahill, RN Outcome: Progressing 11/17/2023 0425 by Delphina Cahill, RN Outcome: Progressing 11/17/2023 0425 by Delphina Cahill, RN Outcome: Progressing   Problem: Health Behavior/Discharge Planning: Goal: Ability to identify and utilize available resources and services will improve 11/17/2023 0426 by Delphina Cahill, RN Outcome: Progressing 11/17/2023 0425 by Delphina Cahill, RN Outcome: Progressing 11/17/2023 0425 by Delphina Cahill, RN Outcome: Progressing Goal: Ability to manage health-related needs will improve 11/17/2023 0426 by Delphina Cahill, RN Outcome: Progressing 11/17/2023 0425 by  Delphina Cahill, RN Outcome: Progressing 11/17/2023 0425 by Delphina Cahill, RN Outcome: Progressing   Problem: Metabolic: Goal: Ability to maintain appropriate glucose levels will improve 11/17/2023 0426 by Delphina Cahill, RN Outcome: Progressing 11/17/2023 0425 by Delphina Cahill, RN Outcome: Progressing 11/17/2023 0425 by Delphina Cahill, RN Outcome: Progressing   Problem: Nutritional: Goal: Maintenance of adequate nutrition will improve 11/17/2023 0425 by Delphina Cahill, RN Outcome: Progressing 11/17/2023 0425 by Delphina Cahill, RN Outcome: Progressing Goal: Progress toward achieving an optimal weight will improve 11/17/2023 0425 by Delphina Cahill, RN Outcome: Progressing 11/17/2023 0425 by Delphina Cahill, RN Outcome: Progressing   Problem: Skin Integrity: Goal: Risk for impaired skin integrity will decrease 11/17/2023 0425 by Delphina Cahill, RN Outcome: Progressing 11/17/2023 0425 by Delphina Cahill, RN Outcome: Progressing   Problem: Tissue Perfusion: Goal: Adequacy of tissue perfusion will improve 11/17/2023 0425 by Delphina Cahill, RN Outcome: Progressing 11/17/2023 0425 by Delphina Cahill, RN Outcome: Progressing   Problem: Education: Goal: Knowledge of disease or condition will improve Outcome: Progressing Goal: Knowledge of secondary prevention will improve (MUST  DOCUMENT ALL) Outcome: Progressing Goal: Knowledge of patient specific risk factors will improve (DELETE if not current risk factor) Outcome: Progressing   Problem: Ischemic Stroke/TIA Tissue Perfusion: Goal: Complications of ischemic stroke/TIA will be minimized Outcome: Progressing   Problem: Coping: Goal: Will verbalize positive feelings about self Outcome: Progressing Goal: Will identify appropriate support needs Outcome: Progressing   Problem: Health Behavior/Discharge Planning: Goal: Ability to manage health-related needs will improve Outcome: Progressing Goal: Goals will be collaboratively established with patient/family Outcome: Progressing   Problem: Self-Care: Goal: Ability to participate in self-care as condition permits will improve Outcome: Progressing Goal: Verbalization of feelings and concerns over difficulty with self-care will improve Outcome: Progressing Goal: Ability to communicate needs accurately will improve Outcome: Progressing   Problem: Nutrition: Goal: Risk of aspiration will decrease Outcome: Progressing Goal: Dietary intake will improve Outcome: Progressing

## 2023-11-17 NOTE — Progress Notes (Signed)
*  PRELIMINARY RESULTS* Echocardiogram 2D Echocardiogram has been performed.  Carolyne Fiscal 11/17/2023, 11:34 AM

## 2023-11-17 NOTE — Plan of Care (Signed)

## 2023-11-17 NOTE — Progress Notes (Signed)
 Inpatient Rehab Admissions Coordinator:    I  spoke to Pt. And his children over the phone. They are interested in CIR. Son and DIL can provide 24/7 assist at d/c. I will pursue for admit and send case to insurance today.   Megan Salon, MS, CCC-SLP Rehab Admissions Coordinator  [phone] (celll) [phone] (office)

## 2023-11-17 NOTE — Care Management Obs Status (Signed)
 MEDICARE OBSERVATION STATUS NOTIFICATION   Patient Details  Name: [name] [name] MRN: [id number] Date of Birth: [birth year]   Medicare Observation Status Notification Given:  Orland Dec, CMA 11/17/2023, 11:12 AM

## 2023-11-17 NOTE — Evaluation (Signed)
 Physical Therapy Evaluation Patient Details Name: [name] [name] MRN: [id number] DOB: 1940-10-11 Today's Date: 11/17/2023  History of Present Illness  Pt is an 83 y.o. male who presents to the ED due to stroke symptoms. 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 Additional punctate subcentimeter acute ischemic nonhemorrhagic infarct within the contralateral right basal ganglia.  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   Clinical Impression  Patient received sitting up on side of bed with OT present in room. Patient has flat affect, pleasant and agrees to PT assessment. Patient requires assistance for sitting balance with posterior lean noted. He requires +2 min A to stand from elevated bed. Slow processing noted throughout session. R side weakness.Patient was able to step pivot to recliner with mod +2 A using RW and then ambulated forward 6 feet with min A +2 for safety. Cues needed for step length, sequencing and management of RW. Patient will continue to benefit from skilled PT to improve mobility, safety, independence and strength.           If plan is discharge home, recommend the following: A lot of help with walking and/or transfers;A lot of help with bathing/dressing/bathroom   Can travel by private vehicle    no    Equipment Recommendations None recommended by PT  Recommendations for Other Services       Functional Status Assessment Patient has had a recent decline in their functional status and demonstrates the ability to make significant improvements in function in a reasonable and predictable amount of time.     Precautions / Restrictions Precautions Precautions: Fall Recall of Precautions/Restrictions: Intact Restrictions Weight Bearing Restrictions Per Provider Order: No      Mobility  Bed Mobility                General bed mobility comments: NT patient received sitting EOB with OT present    Transfers Overall transfer level: Needs assistance Equipment used: Rolling walker (2 wheels) Transfers: Sit to/from Stand Sit to Stand: Min assist, +2 safety/equipment, From elevated surface   Step pivot transfers: Mod assist, +2 physical assistance, +2 safety/equipment       General transfer comment: increased cueing, time for processing/weight shifting anterior and laterally to progress feet during turn; RW management needed    Ambulation/Gait Ambulation/Gait assistance: Min assist, +2 safety/equipment Gait Distance (Feet): 6 Feet Assistive device: Rolling walker (2 wheels) Gait Pattern/deviations: Step-to pattern, Decreased step length - right, Decreased step length - left, Decreased stride length Gait velocity: decreased     General Gait Details: Cues needed to increase step length B, R>L. Cues needed for upright posture  Stairs            Wheelchair Mobility     Tilt Bed    Modified Rankin (Stroke Patients Only)       Balance Overall balance assessment: Needs assistance Sitting-balance support: Feet supported Sitting balance-Leahy Scale: Poor Sitting balance - Comments: needed Mod to Min A for seated balance with cueing for anterior weight shift   Standing balance support: Bilateral upper extremity supported, During functional activity, Reliant on assistive device for balance Standing balance-Leahy Scale: Fair Standing balance comment: +2 for safety                             Pertinent Vitals/Pain Pain Assessment Pain Assessment: No/denies pain  Home Living Family/patient expects to be discharged to:: Private residence Living Arrangements: Other relatives (lives with brother at baseline, son reports brother is not in great health either) Available Help at Discharge: Family;Available 24 hours/day Type of Home: House Home Access: Stairs to  enter Entrance Stairs-Rails: Left;Right;Can reach both Entrance Stairs-Number of Steps: back entrance 5 STE; 3 STE front, if goes home with son 1 small step to enter   Home Layout: One level Home Equipment: Educational psychologist (4 wheels)      Prior Function Prior Level of Function : Independent/Modified Independent;Driving             Mobility Comments: MOD I with rollator, pushes grocery cart through store; move in with son for a bit then went back home with brother, but likely to move back in with son. Limited driving. ADLs Comments: MOD I/IND     Extremity/Trunk Assessment   Upper Extremity Assessment Upper Extremity Assessment: Defer to OT evaluation RUE Deficits / Details: weakness noted to RUE, full AROM    Lower Extremity Assessment Lower Extremity Assessment: RLE deficits/detail RLE Deficits / Details: RLE weakness noted especially with hip flexion RLE Coordination: decreased gross motor    Cervical / Trunk Assessment Cervical / Trunk Assessment: Normal  Communication   Communication Communication: Impaired Factors Affecting Communication: Hearing impaired;Difficulty expressing self;Reduced clarity of speech    Cognition Arousal: Alert Behavior During Therapy: WFL for tasks assessed/performed   PT - Cognitive impairments: Problem solving, Sequencing, Initiation                         Following commands: Impaired Following commands impaired: Follows one step commands with increased time     Cueing Cueing Techniques: Verbal cues     General Comments General comments (skin integrity, edema, etc.): VSS stable following mobility, pt fatigues easily    Exercises     Assessment/Plan    PT Assessment Patient needs continued PT services  PT Problem List Decreased strength;Decreased activity tolerance;Decreased balance;Decreased mobility;Decreased coordination;Decreased cognition;Decreased knowledge of use of DME;Decreased safety awareness        PT Treatment Interventions DME instruction;Gait training;Stair training;Functional mobility training;Therapeutic activities;Therapeutic exercise;Balance training;Neuromuscular re-education;Cognitive remediation;Patient/family education    PT Goals (Current goals can be found in the Care Plan section)  Acute Rehab PT Goals Patient Stated Goal: Son reports he wants to bring patient home, son/dtr in law can provide 24 hour supervision/assist. Dtr in law works from home. PT Goal Formulation: With family Time For Goal Achievement: 12/01/23 Potential to Achieve Goals: Good    Frequency 7X/week     Co-evaluation PT/OT/SLP Co-Evaluation/Treatment: Yes Reason for Co-Treatment: Necessary to address cognition/behavior during functional activity;For patient/therapist safety;To address functional/ADL transfers PT goals addressed during session: Mobility/safety with mobility;Balance;Proper use of DME OT goals addressed during session: ADL's and self-care       AM-PAC PT "6 Clicks" Mobility  Outcome Measure Help needed turning from your back to your side while in a flat bed without using bedrails?: A Lot Help needed moving from lying on your back to sitting on the side of a flat bed without using bedrails?: A Lot Help needed moving to and from a bed to a chair (including a wheelchair)?: A Lot Help needed standing up from a chair using your arms (e.g., wheelchair or bedside chair)?: A Lot Help needed to walk in hospital room?: A Lot Help needed climbing 3-5 steps with a railing? : A Lot 6 Click Score: 12  End of Session Equipment Utilized During Treatment: Gait belt Activity Tolerance: Patient limited by fatigue Patient left: in chair;with call bell/phone within reach;with chair alarm set;with family/visitor present Nurse Communication: Mobility status PT Visit Diagnosis: Other abnormalities of gait and mobility (R26.89);Muscle weakness (generalized) (M62.81);Difficulty in walking, not  elsewhere classified (R26.2);Unsteadiness on feet (R26.81);Hemiplegia and hemiparesis Hemiplegia - Right/Left: Right Hemiplegia - dominant/non-dominant: Dominant Hemiplegia - caused by: Cerebral infarction    Time: 1610-9604 PT Time Calculation (min) (ACUTE ONLY): 19 min   Charges:   PT Evaluation $PT Eval Moderate Complexity: 1 Mod   PT General Charges $$ ACUTE PT VISIT: 1 Visit         Zaela Graley, PT, GCS 11/17/23,10:37 AM

## 2023-11-17 NOTE — Consult Note (Addendum)
 Physical Medicine and Rehabilitation Consult Reason for Consult: CVA Referring Physician: Gillis Santa, MD   HPI: [name] [name] is a 83 y.o. male who presented to the ED with stroke symptoms. 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 Additional punctate subcentimeter acute ischemic nonhemorrhagic infarct was also present within the contralateral right basal ganglia. 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 Physical Medicine & Rehabilitation was consulted to assess candidacy for CIR.     ROS +impaired cognition Past Medical History:  Diagnosis Date   Arthritis    Depression    Diabetes mellitus without complication (HCC)    Gout    Hyperlipidemia    Hypertension    Ischemic cardiomyopathy    History reviewed. No pertinent surgical history. Family History  Problem Relation Age of Onset   Diabetes Sister    Social History:  reports that he quit smoking about 34 years ago. His smoking use included cigarettes. He has never used smokeless tobacco. He reports current alcohol use of about 3.0 standard drinks of alcohol per week. He reports that he does not use drugs. Allergies: No Known Allergies Medications Prior to Admission  Medication Sig Dispense Refill   aspirin EC (ASPIRIN 81) 81 MG tablet Take 1 tablet by mouth daily. (Patient not taking: Reported on 11/16/2023)     atenolol (TENORMIN) 50 MG tablet Take 100 mg by mouth daily. (Patient not taking: Reported on 11/16/2023)     atorvastatin (LIPITOR) 80 MG tablet Take 1 tablet by mouth daily. (Patient not taking: Reported on 11/16/2023)     colchicine 0.6 MG tablet PLEASE SEE ATTACHED FOR DETAILED DIRECTIONS (Patient not taking: Reported on 11/16/2023)     diclofenac Sodium (VOLTAREN) 1 % GEL APPLY 2 GRAMS TO AFFECTED AREA 4 TIMES A DAY (Patient not taking: Reported on 11/16/2023)      diltiazem (CARDIZEM CD) 120 MG 24 hr capsule Take 120 mg by mouth daily. (Patient not taking: Reported on 11/16/2023)     FLUoxetine (PROZAC) 20 MG capsule Take 20 mg by mouth daily. (Patient not taking: Reported on 11/16/2023)     hydrochlorothiazide (HYDRODIURIL) 12.5 MG tablet Take 12.5 mg by mouth daily. (Patient not taking: Reported on 11/16/2023)     lisinopril (ZESTRIL) 10 MG tablet Take 1 tablet by mouth daily. (Patient not taking: Reported on 11/16/2023)     nitroGLYCERIN (NITROSTAT) 0.4 MG SL tablet PLEASE SEE ATTACHED FOR DETAILED DIRECTIONS (Patient not taking: Reported on 11/16/2023)     terbinafine (LAMISIL) 250 MG tablet Take 250 mg by mouth daily. (Patient not taking: Reported on 11/16/2023)      Home: Home Living Family/patient expects to be discharged to:: Private residence Living Arrangements: Other relatives (lives with brother at baseline, son reports brother is not in great health either) Available Help at Discharge: Family, Available 24 hours/day Type of Home: House Home Access: Stairs to enter [employer] of Steps: back entrance 5 STE; 3 STE front, if goes home with son 1 small step to enter Entrance Stairs-Rails: Left, Right, Can reach both Home Layout: One level Bathroom Shower/Tub: Engineer, manufacturing systems: Standard Home Equipment: Information systems manager, Rollator (4 wheels)  Functional History: Prior Function Prior Level of Function : Independent/Modified Independent, Driving Mobility Comments: MOD I with rollator, pushes grocery cart through store; move in with son for a bit then went back home  with brother, but likely to move back in with son. Limited driving. ADLs Comments: MOD I/IND Functional Status:  Mobility: Bed Mobility Overal bed mobility: Needs Assistance Bed Mobility: Supine to Sit Supine to sit: Min assist, Mod assist, HOB elevated, Used rails General bed mobility comments: NT patient received sitting EOB with OT  present Transfers Overall transfer level: Needs assistance Equipment used: Rolling walker (2 wheels) Transfers: Sit to/from Stand Sit to Stand: Min assist, +2 safety/equipment, From elevated surface Bed to/from chair/wheelchair/BSC transfer type:: Step pivot Step pivot transfers: Mod assist, +2 physical assistance, +2 safety/equipment General transfer comment: increased cueing, time for processing/weight shifting anterior and laterally to progress feet during turn; RW management needed Ambulation/Gait Ambulation/Gait assistance: Min assist, +2 safety/equipment Gait Distance (Feet): 6 Feet Assistive device: Rolling walker (2 wheels) Gait Pattern/deviations: Step-to pattern, Decreased step length - right, Decreased step length - left, Decreased stride length General Gait Details: Cues needed to increase step length B, R>L. Cues needed for upright posture Gait velocity: decreased    ADL: ADL Overall ADL's : Needs assistance/impaired Eating/Feeding: Supervision/ safety, Set up, Sitting Eating/Feeding Details (indicate cue type and reason): in recliner, placed splenda in coffee, took lids off items, raised table height d/t pt's height and weakness in RUE making self feeding more difficult Lower Body Dressing: Maximal assistance, Sitting/lateral leans Lower Body Dressing Details (indicate cue type and reason): to don socks Toilet Transfer: Minimal assistance, Moderate assistance, +2 for safety/equipment, +2 for physical assistance Toilet Transfer Details (indicate cue type and reason): simulated to recliner; max verb cueing and RW management for turn to recliner Functional mobility during ADLs: Minimal assistance, Rolling walker (2 wheels), +2 for safety/equipment  Cognition: Cognition Orientation Level: Oriented to person, Disoriented to place, Disoriented to time, Disoriented to situation Cognition Arousal: Alert Behavior During Therapy: [facility] for tasks assessed/performed  Blood  pressure (!) 152/78, pulse 77, temperature (!) 97.5 F (36.4 C), temperature source Oral, resp. rate 18, height 6' (1.829 m), weight 92.2 kg, SpO2 96%. Physical Exam Gen: no distress, normal appearing HEENT: oral mucosa pink and moist, NCAT Cardio: Reg rate Chest: normal effort, normal rate of breathing Abd: soft, non-distended Ext: no edema Psych: pleasant, flat affect Skin: intact Neuro: Alert, slow processing Musculoskeletal: 3/5 strength on right side  Results for orders placed or performed during the hospital encounter of 11/16/23 (from the past 24 hours)  CBG monitoring, ED     Status: Abnormal   Collection Time: 11/16/23  2:52 PM  Result Value Ref Range   Glucose-Capillary 182 (H) 70 - 99 mg/dL  Protime-INR     Status: None   Collection Time: 11/16/23  3:00 PM  Result Value Ref Range   Prothrombin Time 14.3 11.4 - 15.2 seconds   INR 1.1 0.8 - 1.2  APTT     Status: None   Collection Time: 11/16/23  3:00 PM  Result Value Ref Range   aPTT 34 24 - 36 seconds  CBC     Status: Abnormal   Collection Time: 11/16/23  3:00 PM  Result Value Ref Range   WBC 12.1 (H) 4.0 - 10.5 K/uL   RBC 5.27 4.22 - 5.81 MIL/uL   Hemoglobin 15.6 13.0 - 17.0 g/dL   HCT 56.2 13.0 - 86.5 %   MCV 87.5 80.0 - 100.0 fL   MCH 29.6 26.0 - 34.0 pg   MCHC 33.8 30.0 - 36.0 g/dL   RDW 78.4 69.6 - 29.5 %   Platelets 689 (H) 150 - 400 K/uL  nRBC 0.0 0.0 - 0.2 %  Differential     Status: Abnormal   Collection Time: 11/16/23  3:00 PM  Result Value Ref Range   Neutrophils Relative % 86 %   Neutro Abs 10.3 (H) 1.7 - 7.7 K/uL   Lymphocytes Relative 7 %   Lymphs Abs 0.8 0.7 - 4.0 K/uL   Monocytes Relative 7 %   Monocytes Absolute 0.9 0.1 - 1.0 K/uL   Eosinophils Relative 0 %   Eosinophils Absolute 0.1 0.0 - 0.5 K/uL   Basophils Relative 0 %   Basophils Absolute 0.0 0.0 - 0.1 K/uL   Immature Granulocytes 0 %   Abs Immature Granulocytes 0.04 0.00 - 0.07 K/uL  Comprehensive metabolic panel     Status:  Abnormal   Collection Time: 11/16/23  3:00 PM  Result Value Ref Range   Sodium 138 135 - 145 mmol/L   Potassium 3.2 (L) 3.5 - 5.1 mmol/L   Chloride 104 98 - 111 mmol/L   CO2 21 (L) 22 - 32 mmol/L   Glucose, Bld 184 (H) 70 - 99 mg/dL   BUN 15 8 - 23 mg/dL   Creatinine, Ser 6.21 0.61 - 1.24 mg/dL   Calcium 8.2 (L) 8.9 - 10.3 mg/dL   Total Protein 6.5 6.5 - 8.1 g/dL   Albumin 4.0 3.5 - 5.0 g/dL   AST 20 15 - 41 U/L   ALT 13 0 - 44 U/L   Alkaline Phosphatase 85 38 - 126 U/L   Total Bilirubin 1.2 0.0 - 1.2 mg/dL   GFR, Estimated >30 >86 mL/min   Anion gap 13 5 - 15  Ethanol     Status: None   Collection Time: 11/16/23  3:00 PM  Result Value Ref Range   Alcohol, Ethyl (B) <10 <10 mg/dL  Glucose, capillary     Status: Abnormal   Collection Time: 11/16/23  8:57 PM  Result Value Ref Range   Glucose-Capillary 136 (H) 70 - 99 mg/dL  Lipid panel     Status: Abnormal   Collection Time: 11/17/23  5:11 AM  Result Value Ref Range   Cholesterol 155 0 - 200 mg/dL   Triglycerides 69 <578 mg/dL   HDL 41 >46 mg/dL   Total CHOL/HDL Ratio 3.8 RATIO   VLDL 14 0 - 40 mg/dL   LDL Cholesterol 962 (H) 0 - 99 mg/dL  CBC with Differential/Platelet     Status: Abnormal   Collection Time: 11/17/23  5:11 AM  Result Value Ref Range   WBC 8.6 4.0 - 10.5 K/uL   RBC 4.81 4.22 - 5.81 MIL/uL   Hemoglobin 14.4 13.0 - 17.0 g/dL   HCT 95.2 84.1 - 32.4 %   MCV 86.5 80.0 - 100.0 fL   MCH 29.9 26.0 - 34.0 pg   MCHC 34.6 30.0 - 36.0 g/dL   RDW 40.1 02.7 - 25.3 %   Platelets 498 (H) 150 - 400 K/uL   nRBC 0.0 0.0 - 0.2 %   Neutrophils Relative % 87 %   Neutro Abs 7.5 1.7 - 7.7 K/uL   Lymphocytes Relative 6 %   Lymphs Abs 0.5 (L) 0.7 - 4.0 K/uL   Monocytes Relative 6 %   Monocytes Absolute 0.5 0.1 - 1.0 K/uL   Eosinophils Relative 0 %   Eosinophils Absolute 0.0 0.0 - 0.5 K/uL   Basophils Relative 0 %   Basophils Absolute 0.0 0.0 - 0.1 K/uL   Immature Granulocytes 1 %   Abs Immature Granulocytes 0.04  0.00  - 0.07 K/uL  Basic metabolic panel with GFR     Status: Abnormal   Collection Time: 11/17/23  5:11 AM  Result Value Ref Range   Sodium 139 135 - 145 mmol/L   Potassium 3.6 3.5 - 5.1 mmol/L   Chloride 103 98 - 111 mmol/L   CO2 28 22 - 32 mmol/L   Glucose, Bld 134 (H) 70 - 99 mg/dL   BUN 16 8 - 23 mg/dL   Creatinine, Ser 1.61 0.61 - 1.24 mg/dL   Calcium 8.6 (L) 8.9 - 10.3 mg/dL   GFR, Estimated >09 >60 mL/min   Anion gap 8 5 - 15  TSH     Status: None   Collection Time: 11/17/23  5:11 AM  Result Value Ref Range   TSH 0.666 0.350 - 4.500 uIU/mL  Magnesium     Status: None   Collection Time: 11/17/23  5:11 AM  Result Value Ref Range   Magnesium 2.1 1.7 - 2.4 mg/dL  Phosphorus     Status: None   Collection Time: 11/17/23  5:11 AM  Result Value Ref Range   Phosphorus 2.7 2.5 - 4.6 mg/dL  Glucose, capillary     Status: Abnormal   Collection Time: 11/17/23  9:25 AM  Result Value Ref Range   Glucose-Capillary 114 (H) 70 - 99 mg/dL   MR BRAIN WO CONTRAST Result Date: 11/16/2023 CLINICAL DATA:  Initial evaluation for acute neuro deficit, stroke suspected. EXAM: MRI HEAD WITHOUT CONTRAST TECHNIQUE: Multiplanar, multiecho pulse sequences of the brain and surrounding structures were obtained without intravenous contrast. COMPARISON:  CTs from earlier the same day. FINDINGS: Brain: Cerebral volume within normal limits. Patchy T2/FLAIR hyperintensity involving the periventricular and deep white matter both cerebral hemispheres, consistent with chronic small vessel ischemic disease, moderately advanced in nature. Few scatter remote lacunar infarcts present about the right frontal centrum semi ovale, basal ganglia, and thalami. Patchy restricted diffusion seen involving the cortical to subcortical aspect of the left frontal lobe, consistent with a small left MCA distribution infarct (series 5, image 45). Additional punctate subcentimeter acute ischemic infarct noted within the contralateral right basal  ganglia (series 5, image 34). No associated hemorrhage or mass effect. No other evidence for acute or subacute ischemia. Gray-white matter differentiation otherwise maintained. No acute intracranial hemorrhage. Few small foci of susceptibility artifact noted at the level of the left sylvian fissure, suspected to reflect thrombus within distal left MCA branches related to the overlying left MCA distribution infarct (series 12, image 43). No mass lesion, midline shift or mass effect. No hydrocephalus or extra-axial fluid collection. Pituitary gland within normal limits. Vascular: Loss of normal flow void within the left vertebral artery, consistent with previously identified chronic occlusion. Skull and upper cervical spine: Craniocervical junction within normal limits. Bone marrow signal intensity normal. No scalp soft tissue abnormality. Sinuses/Orbits: Globes orbital soft tissues within normal limits. Changes of chronic right maxillary sinus disease noted. Paranasal sinuses are otherwise largely clear. Trace left mastoid effusion noted, of doubtful significance. Other: None. IMPRESSION: 1. Small acute nonhemorrhagic left MCA distribution infarct involving the left frontal lobe. No associated mass effect. 2. Additional punctate subcentimeter acute ischemic nonhemorrhagic infarct within the contralateral right basal ganglia. 3. Underlying moderately advanced chronic microvascular ischemic disease with a few scattered remote lacunar infarcts as above. 4. Chronic occlusion of the left vertebral artery. Electronically Signed   By: Rise Mu M.D.   On: 11/16/2023 23:48   CT ANGIO HEAD NECK W WO CM (  CODE STROKE) Result Date: 11/16/2023 CLINICAL DATA:  Code stroke, neuro deficit, right-sided facial droop, mild aphasia, left gaze preference. EXAM: CT ANGIOGRAPHY HEAD AND NECK WITH AND WITHOUT CONTRAST TECHNIQUE: Multidetector CT imaging of the head and neck was performed using the standard protocol during  bolus administration of intravenous contrast. Multiplanar CT image reconstructions and MIPs were obtained to evaluate the vascular anatomy. Carotid stenosis measurements (when applicable) are obtained utilizing NASCET criteria, using the distal internal carotid diameter as the denominator. RADIATION DOSE REDUCTION: This exam was performed according to the departmental dose-optimization program which includes automated exposure control, adjustment of the mA and/or kV according to patient size and/or use of iterative reconstruction technique. CONTRAST:  75mL OMNIPAQUE IOHEXOL 350 MG/ML SOLN COMPARISON:  Same-day head CT. FINDINGS: CTA NECK FINDINGS Aortic arch: Standard configuration of the aortic arch. Imaged portion shows no evidence of aneurysm or dissection. No significant stenosis of the major arch vessel origins. Pulmonary arteries: As permitted by contrast timing, there are no filling defects in the visualized pulmonary arteries. Subclavian arteries: The subclavian arteries are patent bilaterally. Right carotid system: Patent. Mild atherosclerosis at the carotid bifurcation without hemodynamically significant stenosis. No evidence of dissection. Tortuosity of the mid cervical ICA. Left carotid system: Patent. Mild atherosclerosis at the carotid bifurcation without hemodynamically significant stenosis. No evidence of dissection. Vertebral arteries: Right vertebral artery is dominant. The right vertebral artery is patent from the origin to the vertebrobasilar confluence. Atherosclerosis of the right V4 segment resulting in mild-to-moderate stenosis. There is atherosclerosis at the origin of the non dominant left vertebral artery with limited evaluation of stenosis which is at least moderate in severity. The left vertebral artery is patent to the proximal V3 segment. The vertebral artery is occluded from this level to the vertebrobasilar confluence. Prominent focal atherosclerosis along the distal left V4 segment.  Skeleton: No acute findings. Degenerative changes in the cervical spine. Intervertebral disc space narrowing greatest at C5-6 through C7-T1. Edentulous maxilla and mandible. Mucosal thickening in the right maxillary sinus with findings suggestive of mucoperiosteal reaction. Other neck: The visualized airway is patent. No cervical lymphadenopathy. Subcentimeter thyroid nodules. Upper chest: Centrilobular emphysema in the visualized upper lobes with additional paraseptal emphysema in the lung apices most pronounced on the right. Review of the MIP images confirms the above findings CTA HEAD FINDINGS ANTERIOR CIRCULATION: The intracranial ICAs are patent bilaterally. Atherosclerosis of the bilateral carotid siphons. There is mild stenosis of the right supraclinoid ICA. No high-grade stenosis, proximal occlusion, aneurysm, or vascular malformation. MCAs: The right M1 segment is patent. There is severe stenosis at the origin of a M2 superior division branch. Short-segment occlusion of an M2 inferior division branch with reconstitution within the sylvian fissure. The left M1 segment is patent. There is mild narrowing of an M2 inferior division branch. Severe stenosis and subtotal occlusion of an M2 inferior division branch of the left MCA with additional multifocal severe stenosis of M3 and M4 branches within this distribution. ACAs: The A1 and A2 segments are patent bilaterally. Moderate stenosis of the bilateral A3 segments. Additional focal severe stenosis of the left A4 segment. POSTERIOR CIRCULATION: PCAs: Patent bilaterally. Focal severe stenosis of the proximal P3 segment right PCA. Additional severe stenosis of the posterior P2 segment and proximal P3 segment of the left PCA. Pcomm: Visualized on the right. SCAs: The superior cerebellar arteries are patent bilaterally. Basilar artery: Patent AICAs: Patent PICAs: Visualized on the right. Vertebral arteries: Patent on the right. Venous sinuses: As permitted by  contrast timing, patent. Anatomic variants: None Review of the MIP images confirms the above findings IMPRESSION: No large vessel occlusion or evidence of findings amenable to neurovascular intervention. 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 Multiple intracranial vascular stenoses as above. Focal short segment occlusion of an M2 inferior division branch of the right MCA with reconstitution noted. Mild-to-moderate stenosis of the V4 segment right vertebral artery. Emphysema (ICD10-J43.9). Electronically Signed   By: Emily Filbert M.D.   On: 11/16/2023 15:50   CT HEAD CODE STROKE WO CONTRAST Result Date: 11/16/2023 CLINICAL DATA:  Code stroke. Right facial droop and slurred speech. Last known well at 9:15 a.m. EXAM: CT HEAD WITHOUT CONTRAST TECHNIQUE: Contiguous axial images were obtained from the base of the skull through the vertex without intravenous contrast. RADIATION DOSE REDUCTION: This exam was performed according to the departmental dose-optimization program which includes automated exposure control, adjustment of the mA and/or kV according to patient size and/or use of iterative reconstruction technique. COMPARISON:  None Available. FINDINGS: Brain: Moderate atrophy and white matter changes are present bilaterally. Age indeterminate infarcts are present in the anterior thalami bilaterally, more prominent right than left. Subtle hypoattenuation is present at the anterior limb of the right internal capsule. A subcortical white matter infarct is present in the anterior right frontal lobe superior to the frontal horn of the right lateral ventricle. An age indeterminate infarct is present in the left lentiform nucleus. No other acute cortical infarcts are present. No acute hemorrhage or mass lesion is present. No significant extra-axial fluid collections are present. The ventricles are proportionate  to the degree of atrophy. The brainstem and cerebellum are within normal limits. Midline structures are within normal limits. Vascular: Atherosclerotic calcifications are present at the cavernous internal carotid arteries and at the dural margin of both vertebral arteries. No hyperdense vessel is present. Skull: Calvarium is intact. No focal lytic or blastic lesions are present. No significant extracranial soft tissue lesion is present. Sinuses/Orbits: The paranasal sinuses and mastoid air cells are clear. The globes and orbits are within normal limits. ASPECTS [facility] Stroke Program Early CT Score) - Ganglionic level infarction (caudate, lentiform nuclei, internal capsule, insula, M1-M3 cortex): 6/7 - Supraganglionic infarction (M4-M6 cortex): 3/3 Total score (0-10 with 10 being normal): 9/10 IMPRESSION: 1. 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 2. Subtle hypoattenuation at the anterior limb of the right internal capsule. 3. 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 4. Age indeterminate infarct in the left lentiform nucleus. 5. Moderate atrophy and white matter disease likely reflects the sequela of chronic microvascular ischemia. 6. No acute hemorrhage or mass lesion. These results were called by telephone at the time of interpretation on 11/16/2023 at 3:13 pm to provider Dr. Selina Cooley, who verbally acknowledged these results. Electronically Signed   By: Marin Roberts M.D.   On: 11/16/2023 15:14    Assessment/Plan: Diagnosis: Left MCA CVA Does the need for close, 24 hr/day medical supervision in concert with the patient's rehab needs make it unreasonable for this patient to be served in a less intensive setting? Yes Co-Morbidities requiring supervision/potential complications:  1) Type 2 DM: monitor CBGs 2) HTN: monitor BO 3) HLD 4) obstructive CAD 5)  Ischemic cardiomyopathy 6) chronic thrombocytosis: monitor PC Due to bladder management, bowel management, safety, skin/wound care,  disease management, medication administration, pain management, and patient education, does the patient require 24 hr/day rehab nursing? Yes Does the patient require coordinated care of a physician, rehab nurse, therapy disciplines of PT, OT to address physical and functional deficits in the context of the above medical diagnosis(es)? Yes Addressing deficits in the following areas: balance, endurance, locomotion, strength, transferring, bowel/bladder control, bathing, dressing, feeding, grooming, toileting, cognition, and psychosocial support Can the patient actively participate in an intensive therapy program of at least 3 hrs of therapy per day at least 5 days per week? Yes The potential for patient to make measurable gains while on inpatient rehab is excellent Anticipated functional outcomes upon discharge from inpatient rehab are supervision  with PT, supervision with OT, supervision with SLP. Estimated rehab length of stay to reach the above functional goals is: 10-14 days Anticipated discharge destination: Home Overall Rehab/Functional Prognosis: excellent  POST ACUTE RECOMMENDATIONS: This patient's condition is appropriate for continued rehabilitative care in the following setting: CIR Patient has agreed to participate in recommended program. Yes Note that insurance prior authorization may be required for reimbursement for recommended care.     I have personally performed a face to face diagnostic evaluation of this patient. Additionally, I have examined the patient's medical record including any pertinent labs and radiographic images.    Thanks,  Horton Chin, MD 11/17/2023

## 2023-11-17 NOTE — Evaluation (Signed)
 Occupational Therapy Evaluation Patient Details Name: [name] [name] MRN: [id number] DOB: 19-Oct-1940 Today's Date: 11/17/2023   History of Present Illness   Pt is an 83 y.o. male who presents to the ED due to stroke symptoms. 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 Additional punctate subcentimeter acute ischemic nonhemorrhagic infarct within the contralateral right basal ganglia.  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     Clinical Impressions Pt was seen for OT/PT co-evaluation this date. Prior to hospital admission, pt was residing at home and his brother moved in with him. Per son, brother is nearly blind and not able to physically assist pt. Pt provided history with son verifying information. Pt lived with his brother in a 1 level home and was MOD I with mobility using a rollator and ADL/IADL performance and driving short distances. Pt moved in with son for a bit, but returned back home. Son will be able to take pt back home with him after hospitalization.  Pt presents to acute OT demonstrating impaired ADL performance and functional mobility 2/2 weakness, balance deficits, processing deficits and diminished activity tolerance. Pt currently requires Mod A for trunkal elevation to get OOB, but able to manage BLEs and forward scoot. R posteriolateral lean noted initially, but able to self correct with cueing for anterior weight shift. Mod to CGA for seated balance. PT joined session. MAX A for LB dressing to don socks. Min A x2 for STS from EOB to RW from elevated bed height. Min/Mod A x2 for SPT to recliner with cueing for positioning, hand/feet placement, RW management and weight shifting to advance BLEs. Able to ambulate short distance in room with Min Ax2 for chair follow/safety. Pt set up for self feeding breakfast with table height elevated.  BUE ROM WFL, RUE weakness noted 4-/5 grossly. Despite fatigue, pt is highly motivated to gain his independence back. Son Thayer Ohm present and very supportive/involved in his care. Pt would benefit from skilled OT services to address noted impairments and functional limitations to maximize safety and independence while minimizing falls risk and caregiver burden. Do anticipate the need for follow up OT services upon acute hospital DC.      If plan is discharge home, recommend the following:   A lot of help with bathing/dressing/bathroom;A lot of help with walking and/or transfers;Assistance with cooking/housework;Help with stairs or ramp for entrance     Functional Status Assessment   Patient has had a recent decline in their functional status and demonstrates the ability to make significant improvements in function in a reasonable and predictable amount of time.     Equipment Recommendations   BSC/3in1;Other (comment) (RW)     Recommendations for Other Services   Rehab consult     Precautions/Restrictions   Precautions Precautions: Fall Recall of Precautions/Restrictions: Intact Restrictions Weight Bearing Restrictions Per Provider Order: No     Mobility Bed Mobility Overal bed mobility: Needs Assistance Bed Mobility: Supine to Sit     Supine to sit: Min assist, Mod assist, HOB elevated, Used rails     General bed mobility comments: cueing for hand placement, increased time and effort to scoot forward assist with trunk d/t R lateral/posterior lean    Transfers Overall transfer level: Needs assistance Equipment used: Rolling walker (2 wheels) Transfers: Sit to/from Stand, Bed to chair/wheelchair/BSC Sit to Stand: Min assist, +2 physical assistance, From elevated surface  Step pivot transfers: Min assist, +2 physical assistance, +2 safety/equipment     General transfer comment: increased cueing, time for processing/weight shifting anterior and laterally to  progress feet during turn; RW management needed      Balance Overall balance assessment: Needs assistance Sitting-balance support: Feet supported, Bilateral upper extremity supported Sitting balance-Leahy Scale: Poor Sitting balance - Comments: needed Mod to Min A for seated balance with cueing for anterior weight shift Postural control: Posterior lean, Right lateral lean Standing balance support: Reliant on assistive device for balance, Bilateral upper extremity supported Standing balance-Leahy Scale: Poor Standing balance comment: +2 for safety/chair follow and RW                           ADL either performed or assessed with clinical judgement   ADL Overall ADL's : Needs assistance/impaired Eating/Feeding: Supervision/ safety;Set up;Sitting Eating/Feeding Details (indicate cue type and reason): in recliner, placed splenda in coffee, took lids off items, raised table height d/t pt's height and weakness in RUE making self feeding more difficult                 Lower Body Dressing: Maximal assistance;Sitting/lateral leans Lower Body Dressing Details (indicate cue type and reason): to don socks Toilet Transfer: Minimal assistance;Moderate assistance;+2 for safety/equipment;+2 for physical assistance Toilet Transfer Details (indicate cue type and reason): simulated to recliner; max verb cueing and RW management for turn to recliner         Functional mobility during ADLs: Minimal assistance;Rolling walker (2 wheels);+2 for safety/equipment       Vision         Perception         Praxis         Pertinent Vitals/Pain       Extremity/Trunk Assessment Upper Extremity Assessment Upper Extremity Assessment: Generalized weakness;Right hand dominant;RUE deficits/detail RUE Deficits / Details: weakness noted to RUE, full AROM   Lower Extremity Assessment Lower Extremity Assessment: Generalized weakness;RLE deficits/detail RLE Deficits / Details: RLE  weakness noted       Communication Communication Communication: Impaired Factors Affecting Communication: Hearing impaired;Difficulty expressing self;Reduced clarity of speech   Cognition Arousal: Alert Behavior During Therapy: WFL for tasks assessed/performed Cognition: No apparent impairments             OT - Cognition Comments: A and O x3-states name,his address, knew he was in a [place], able to state April 2025 did not know exact day of the week                         Cueing  General Comments   Cueing Techniques: Verbal cues  VSS stable following mobility, pt fatigues easily   Exercises Other Exercises Other Exercises: Edu on role of OT in acute setting. Other Exercises: Son came in and verified PLOF information and is agreeable to IPR for his father to bring him home after.   Shoulder Instructions      Home Living Family/patient expects to be discharged to:: Private residence Living Arrangements: Other relatives (brother) Available Help at Discharge: Family Type of Home: House Home Access: Stairs to enter [employer] of Steps: back entrance 5 STE; 3 STE front Entrance Stairs-Rails: Left;Right;Can reach both Home Layout: One level     Bathroom Shower/Tub: Chief Strategy Officer: Standard     Home Equipment: Educational psychologist (4 wheels)  Prior Functioning/Environment Prior Level of Function : Independent/Modified Independent;Driving             Mobility Comments: MOD I with rollator, pushes grocery cart through store; move in with son for a bit then went back home with brother, but likely to move back in with son ADLs Comments: MOD I/IND    OT Problem List: Decreased strength;Impaired balance (sitting and/or standing);Decreased activity tolerance   OT Treatment/Interventions: Self-care/ADL training;Therapeutic exercise;Patient/family education;Balance training;Neuromuscular education;Energy  conservation;Therapeutic activities;DME and/or AE instruction      OT Goals(Current goals can be found in the care plan section)   Acute Rehab OT Goals Patient Stated Goal: improve function and speech OT Goal Formulation: With patient/family Time For Goal Achievement: 12/01/23 Potential to Achieve Goals: Good ADL Goals Pt Will Perform Grooming: with set-up;sitting Pt Will Perform Upper Body Bathing: with set-up;sitting Pt Will Perform Lower Body Bathing: with contact guard assist;sitting/lateral leans;sit to/from stand Pt Will Transfer to Toilet: with contact guard assist;ambulating;regular height toilet;grab bars Pt Will Perform Toileting - Clothing Manipulation and hygiene: with min assist;with contact guard assist;sitting/lateral leans;sit to/from stand   OT Frequency:  Min 3X/week    Co-evaluation PT/OT/SLP Co-Evaluation/Treatment: Yes Reason for Co-Treatment: Complexity of the patient's impairments (multi-system involvement);For patient/therapist safety;To address functional/ADL transfers PT goals addressed during session: Mobility/safety with mobility OT goals addressed during session: ADL's and self-care      AM-PAC OT "6 Clicks" Daily Activity     Outcome Measure Help from another person eating meals?: A Little Help from another person taking care of personal grooming?: A Little Help from another person toileting, which includes using toliet, bedpan, or urinal?: A Lot Help from another person bathing (including washing, rinsing, drying)?: A Lot Help from another person to put on and taking off regular upper body clothing?: A Little Help from another person to put on and taking off regular lower body clothing?: A Lot 6 Click Score: 15   End of Session Equipment Utilized During Treatment: Gait belt;Rolling walker (2 wheels) Nurse Communication: Mobility status  Activity Tolerance: Patient tolerated treatment well Patient left: in chair;with call bell/phone within  reach;with chair alarm set;with family/visitor present;with nursing/sitter in room  OT Visit Diagnosis: Other abnormalities of gait and mobility (R26.89);Unsteadiness on feet (R26.81);Muscle weakness (generalized) (M62.81)                Time: 1610-9604 OT Time Calculation (min): 44 min Charges:  OT General Charges $OT Visit: 1 Visit OT Evaluation $OT Eval Moderate Complexity: 1 Mod OT Treatments $Self Care/Home Management : 8-22 mins  Corneshia Hines, OTR/L 11/17/23, 9:51 AM  Constance Goltz 11/17/2023, 9:45 AM

## 2023-11-17 NOTE — Progress Notes (Signed)
 PMR Admission Coordinator Pre-Admission Assessment  Patient: [name] [name] is an 83 y.o., male MRN: [id number] DOB: [birth year] Height: 6' (182.9 cm) Weight: 92.2 kg  Insurance Information HMO: yes     PPO: yes     PCP:      IPA:      80/20:      OTHER:  PRIMARY:[employer] MEDICARE HMO/PPO    Policy#: [id number], group: [id number]      Subscriber: Pt CM Name:  [name] [name]      Phone#: [phone]     Fax#: [phone] Fax#: [phone] Received approval via phone on 4/11 for admit 2/72-5/36 Pre-Cert#: [id number]      Employer:  Benefits:  Phone #:      Name:  [name] [name] Date: 08/09/2023- still active Deductible: does not have OOP Max: $4,150 ($365 met) CIR: $300/day co-pay with a max co-pay of $1,800/admission (6 days) SNF: $10/day co-pay for days 1-20, $214/day co-pay for days 21-100 Outpatient:  $10 copay/visit Home Health:  100% coverage DME: 80% coverage; 20% co-insurance Providers: in network  SECONDARY:       Policy#:      Phone#:    Artist:       Phone#:   The Data processing manager" for patients in Inpatient Rehabilitation Facilities with attached "Privacy Act Statement-Health Care Records" was provided and verbally reviewed with: Patient  Emergency Contact Information Contact Information     Name Relation Home Work Mobile   [name],[name] L Son [phone]     [name] [name],[name] [name] Daughter   [phone]      Other Contacts   None on File     Current Medical History  Patient Admitting Diagnosis: CVA   History of Present Illness: [name] [name] is a 83 y.o. 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 On arrival to the ED, patient was hypertensive at 176/92 with heart rate of 89.  He was saturating at 96% on  room air.  He was afebrile. Initial workup notable for WBC at 12.1, platelets 689, potassium 3.2, glucose 184, creatinine 0.89 with GFR above 60.  CT head with no acute infarct.  CTA head/neck with no LVO.  Neurology consulted. 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 Additional punctate subcentimeter acute ischemic nonhemorrhagic infarct was also present within the contralateral right basal ganglia. He was seen by PT/OT and they recommend CIR to assist return to PLOF.  Complete NIHSS TOTAL: 9  Patient's medical record from [facility] has been reviewed by the rehabilitation admission coordinator and physician.  Past Medical History  Past Medical History:  Diagnosis Date   Arthritis    Depression    Diabetes mellitus without complication (HCC)    Gout    Hyperlipidemia    Hypertension    Ischemic cardiomyopathy     Has the patient had major surgery during 100 days prior to admission? No  Family History   family history includes Diabetes in his sister.  Current Medications  Current Facility-Administered Medications:    acetaminophen (TYLENOL) tablet 650 mg, 650 mg, Oral, Q4H PRN **[name]** acetaminophen (TYLENOL) 160 MG/5ML solution 650 mg, 650 mg, Per Tube, Q4H PRN **[name]** acetaminophen (TYLENOL) suppository 650 mg, 650 mg, Rectal, Q4H PRN, Avi Body, MD   [COMPLETED] aspirin EC  tablet 325 mg, 325 mg, Oral, Once, 325 mg at 11/17/23 0134 **FOLLOWED BY** [START ON 11/18/2023] aspirin EC tablet 81 mg, 81 mg, Oral, Daily, Basaraba, Iulia, MD   clopidogrel (PLAVIX) tablet 75 mg, 75 mg, Oral, Daily, Althia Atlas, MD, 75 mg at 11/17/23 1133   enoxaparin (LOVENOX) injection 52.5 mg, 0.5 mg/kg (Order-Specific), Subcutaneous, Q24H, Madelynn Schilder, RPH, 52.5 mg at 11/16/23 2131   insulin aspart (novoLOG) injection 0-15 Units, 0-15 Units, Subcutaneous, TID WC, Basaraba, Iulia, MD   ondansetron (ZOFRAN) injection 4 mg,  4 mg, Intravenous, Q6H PRN, Avi Body, MD   rosuvastatin (CRESTOR) tablet 20 mg, 20 mg, Oral, Daily, Basaraba, Iulia, MD, 20 mg at 11/16/23 2132   senna-docusate (Senokot-S) tablet 1 tablet, 1 tablet, Oral, QHS PRN, Avi Body, MD  Patients Current Diet:  Diet Order             Diet heart healthy/carb modified Room service appropriate? Yes; Fluid consistency: Thin  Diet effective now                   Precautions / Restrictions Precautions Precautions: Fall Restrictions Weight Bearing Restrictions Per Provider Order: No   Has the patient had 2 [name] more falls [name] a fall with injury in the past year? No  Prior Activity Level Pt. Active in the community PTA, getting out daily.   Prior Functional Level Self Care: Did the patient need help bathing, dressing, using the toilet [name] eating? Independent  Indoor Mobility: Did the patient need assistance with walking from room to room (with [name] without device)? Independent  Stairs: Did the patient need assistance with internal [name] external stairs (with [name] without device)? Independent  Functional Cognition: Did the patient need help planning regular tasks such as shopping [name] remembering to take medications? Independent  Patient Information Are you of Hispanic, Latino/a,[name] Spanish origin?: A. No, not of Hispanic, Latino/a, [name] Spanish origin What is your race?: A. White Do you need [name] want an interpreter to communicate with a doctor [name] health care staff?: 0. No  Patient's Response To:  Health Literacy and Transportation Is the patient able to respond to health literacy and transportation needs?: No Health Literacy - How often do you need to have someone help you when you read instructions, pamphlets, [name] other written material from your doctor [name] pharmacy?: Never In the past 12 months, has lack of transportation kept you from medical appointments [name] from getting medications?: No In the past 12 months, has lack of transportation  kept you from meetings, work, [name] from getting things needed for daily living?: No  Journalist, newspaper / Equipment Home Equipment: Information systems manager, Rollator (4 wheels)  Prior Device Use: Indicate devices/aids used by the patient prior to current illness, exacerbation [name] injury? Walker  Current Functional Level Cognition  Arousal/Alertness: Awake/alert Overall Cognitive Status: Impaired/Different from baseline Orientation Level: Oriented to person, Disoriented to place, Disoriented to time, Disoriented to situation    Extremity Assessment (includes Sensation/Coordination)  Upper Extremity Assessment: Defer to OT evaluation RUE Deficits / Details: weakness noted to RUE, full AROM  Lower Extremity Assessment: RLE deficits/detail RLE Deficits / Details: RLE weakness noted especially with hip flexion RLE Coordination: decreased gross motor    ADLs  Overall ADL's : Needs assistance/impaired Eating/Feeding: Supervision/ safety, Set up, Sitting Eating/Feeding Details (indicate cue type and reason): in recliner, placed splenda in coffee, took lids off items, raised table height d/t pt's height and weakness in RUE making self feeding more difficult  Lower Body Dressing: Maximal assistance, Sitting/lateral leans Lower Body Dressing Details (indicate cue type and reason): to don socks Toilet Transfer: Minimal assistance, Moderate assistance, +2 for safety/equipment, +2 for physical assistance Toilet Transfer Details (indicate cue type and reason): simulated to recliner; max verb cueing and RW management for turn to recliner Functional mobility during ADLs: Minimal assistance, Rolling walker (2 wheels), +2 for safety/equipment    Mobility  Overal bed mobility: Needs Assistance Bed Mobility: Supine to Sit Supine to sit: Min assist, Mod assist, HOB elevated, Used rails General bed mobility comments: NT patient received sitting EOB with OT present    Transfers  Overall transfer level: Needs  assistance Equipment used: Rolling walker (2 wheels) Transfers: Sit to/from Stand Sit to Stand: Min assist, +2 safety/equipment, From elevated surface Bed to/from chair/wheelchair/BSC transfer type:: Step pivot Step pivot transfers: Mod assist, +2 physical assistance, +2 safety/equipment General transfer comment: increased cueing, time for processing/weight shifting anterior and laterally to progress feet during turn; RW management needed    Ambulation / Gait / Stairs / Wheelchair Mobility  Ambulation/Gait Ambulation/Gait assistance: Min assist, +2 safety/equipment Gait Distance (Feet): 6 Feet Assistive device: Rolling walker (2 wheels) Gait Pattern/deviations: Step-to pattern, Decreased step length - right, Decreased step length - left, Decreased stride length General Gait Details: Cues needed to increase step length B, R>L. Cues needed for upright posture Gait velocity: decreased    Posture / Balance Dynamic Sitting Balance Sitting balance - Comments: needed Mod to Min A for seated balance with cueing for anterior weight shift Balance Overall balance assessment: Needs assistance Sitting-balance support: Feet supported Sitting balance-Leahy Scale: Poor Sitting balance - Comments: needed Mod to Min A for seated balance with cueing for anterior weight shift Postural control: Posterior lean, Right lateral lean Standing balance support: Bilateral upper extremity supported, During functional activity, Reliant on assistive device for balance Standing balance-Leahy Scale: Fair Standing balance comment: +2 for safety    Special needs/care consideration Special service needs none   Previous Home Environment (from acute therapy documentation) Living Arrangements: Other relatives (lives with brother at baseline, son reports brother is not in great health either)  Lives With: Alone Available Help at Discharge: Family, Available 24 hours/day Type of Home: House Home Layout: One level Home  Access: Stairs to enter Entrance Stairs-Rails: Left, Right, Can reach both Entrance Stairs-Number of Steps: back entrance 5 STE; 3 STE front, if goes home with son 1 small step to enter Bathroom Shower/Tub: Associate Professor: Yes Home Care Services: No  Discharge Living Setting Plans for Discharge Living Setting: House Type of Home at Discharge: House Discharge Home Layout: Two level, Able to live on main level with bedroom/bathroom Alternate Level Stairs-Rails: Right Alternate Level Stairs-Number of Steps: flight Discharge Home Access: Stairs to enter Entrance Stairs-Rails: None Entrance Stairs-Number of Steps: 1 Discharge Bathroom Shower/Tub: Tub/shower unit Discharge Bathroom Toilet: Standard Discharge Bathroom Accessibility: Yes How Accessible: Accessible via walker, Accessible via wheelchair Does the patient have any problems obtaining your medications?: No  Social/Family/Support Systems Patient Roles: Other (Comment) Contact Information: [name] (son) Anticipated Caregiver: works, wife is home all the time Anticipated Industrial/product designer Information: / Ability/Limitations of Caregiver: 24/7 Caregiver Availability: 24/7 Discharge Plan Discussed with Primary Caregiver: Yes Is Caregiver In Agreement with Plan?: Yes Does Caregiver/Family have Issues with Lodging/Transportation while Pt is in Rehab?: No  Goals Patient/Family Goal for Rehab: PT/OT/SLP Supervision Expected length of stay: 12-14 Pt/Family Agrees to Admission and willing to participate: Yes Program  Orientation Provided & Reviewed with Pt/Caregiver Including Roles  & Responsibilities: Yes  Decrease burden of Care through IP rehab admission: not anticipated  Possible need for SNF placement upon discharge: not anticipated  Patient Condition: I have reviewed medical records from [facility], spoken with CM, and patient and family member. I met  with patient at the bedside for inpatient rehabilitation assessment.  Patient will benefit from ongoing PT, OT, and SLP, can actively participate in 3 hours of therapy a day 5 days of the week, and can make measurable gains during the admission.  Patient will also benefit from the coordinated team approach during an Inpatient Acute Rehabilitation admission.  The patient will receive intensive therapy as well as Rehabilitation physician, nursing, social worker, and care management interventions.  Due to safety, skin/wound care, disease management, medication administration, pain management, and patient education the patient requires 24 hour a day rehabilitation nursing.  The patient is currently min-mod A with mobility and basic ADLs.  Discharge setting and therapy post discharge at home with home health is anticipated.  Patient has agreed to participate in the Acute Inpatient Rehabilitation Program and will admit {Time; today/tomorrow:10263}.  Preadmission Screen Completed By:  Dorena Gander, 11/17/2023 2:58 PM ______________________________________________________________________   Discussed status with Dr. Aaron Aas on *** at *** and received approval for admission today.  Admission Coordinator:  Dorena Gander, CCC-SLP, time Aaron AasAlanna Hu ***   Assessment/Plan: Diagnosis: *** Does the need for close, 24 hr/day Medical supervision in concert with the patient's rehab needs make it unreasonable for this patient to be served in a less intensive setting? {yes_no_potentially:3041433} Co-Morbidities requiring supervision/potential complications: *** Due to {due ZO:1096045}, does the patient require 24 hr/day rehab nursing? {yes_no_potentially:3041433} Does the patient require coordinated care of a physician, rehab nurse, PT, OT, and SLP to address physical and functional deficits in the context of the above medical diagnosis(es)? {yes_no_potentially:3041433} Addressing deficits in the following areas:  {deficits:3041436} Can the patient actively participate in an intensive therapy program of at least 3 hrs of therapy 5 days a week? {yes_no_potentially:3041433} The potential for patient to make measurable gains while on inpatient rehab is {potential:3041437} Anticipated functional outcomes upon discharge from inpatient rehab: {functional outcomes:304600100} PT, {functional outcomes:304600100} OT, {functional outcomes:304600100} SLP Estimated rehab length of stay to reach the above functional goals is: *** Anticipated discharge destination: {anticipated dc setting:21604} 10. Overall Rehab/Functional Prognosis: {potential:3041437}   MD Signature: ***

## 2023-11-17 NOTE — Progress Notes (Signed)
  Inpatient Rehab Admissions Coordinator :  Per therapy recommendations, patient was screened for CIR candidacy by Ottie Glazier RN MSN.  At this time patient appears to be a potential candidate for CIR. I will place a rehab consult per protocol for full assessment. Please call me with any questions.  Ottie Glazier RN MSN Admissions Coordinator [phone]

## 2023-11-17 NOTE — Progress Notes (Signed)
 [facility] Progress Note  Patient: [name] [name]    ZOX:[id number]  DOA: 11/16/2023     Date of Service: the patient was seen and examined on 11/17/2023  Chief Complaint  Patient presents with   Code Stroke   Brief hospital course:  [name] [name] is a 83 y.o. 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   History obtained predominantly from patient's family at bedside due to patient's difficulty speaking.  His son [name] [name] states that he checked on [name] [name] at approximately 9-9:30 AM and he seemed at his baseline.  Then his brother went to go see him him again at approximately 2 PM and noted that he had a severe right-sided facial droop with drooling out the side of his mouth and was unable to speak coherently.  Due to this, an ambulance was called.  [name] [name] states that 3-4 months ago, patient had an episode of vomiting with inability to walk due to bilateral lower extremity weakness.  He slowly recovered over 3-4 weeks, but wonders if this was also a stroke.  [name] [name] also notes that patient has not been taking any of his home medications and has not seen any physician in over 2 years.   [name] [name] denies any recent illness, nausea, vomiting, chest pain, shortness of breath, palpitations.   ED course: On arrival to the ED, patient was hypertensive at 176/92 with heart rate of 89.  He was saturating at 96% on room air.  He was afebrile. Initial workup notable for WBC at 12.1, platelets 689, potassium 3.2, glucose 184, creatinine 0.89 with GFR above 60.  CT head with no acute infarct.  CTA head/neck with no LVO.  Neurology consulted.  [facility] contacted for admission.    Assessment and Plan:  # Acute CVA (cerebrovascular accident) [facility]) Patient is presenting with right-sided facial droop, right  weakness and slurred speech with symptoms beginning sometime between 9:30 AM and 2 PM today.  No evidence of LVO on imaging and outside of the window for TNK. MRI positive for acute CVA, please review complete report. LDL 100 goal <70, TSH 0.6 at lower end, free T41.0 Within normal range HbA1c pending Allow permissive hypertension for 2 to 3 days and then normotensive TTE shows LVEF 40 to 45%, grade 1 diastolic dysfunction, no any other significant findings Neurology consulted, started aspirin and Plavix, recommended DAPT for 90 days f/b Plavix 75 mg daily monotherapy. Started Crestor 20 mg p.o. daily PT and OT eval done, recommended CIR SLP eval done, started heart healthy and carb modified diet Continue aspiration precautions, fall precautions. Continue to monitor on telemetry Cardiology involved to arrange cardiac monitor to rule out occult arrhythmias Follow-up with neurology in 1 to 2 weeks as an outpatient     Thrombocytosis Per chart review, patient has a history of chronic thrombocytosis of unknown etiology dating back to at least 2016.  Stable at this time. - Outpatient follow-up with oncology to establish etiology Continue DAPT as above    Ischemic cardiomyopathy Per chart review, patient has a history of ischemic cardiomyopathy with last ultrasound available 2019 that demonstrated mildly reduced LVEF at 45%.  No follow-up since then. 4/11 TTE shows LVEF 40 to 45%, grade 1 diastolic dysfunction, no any other significant findings Need to start GDMT after 2 to 3 days of permissive hypertension  Coronary artery disease Per chart review, patient has a history of CAD s/p bare-metal stent to mid LAD in 2012.  Last follow-up with cardiology was in 2019. Patient stopped taking all home medications. Continue aspirin and Plavix as above   Type 2 diabetes mellitus (HCC) History of type 2 diabetes with last A1c available in 2023, at 6.1%. - A1c pending - SSI,  moderate  Hypokalemia, resolved. Monitor electrolytes.   Body mass index is 27.57 kg/m.  Interventions:  Diet: Heart healthy carb modified DVT Prophylaxis: Subcutaneous Lovenox   Advance goals of care discussion: Full code  Family Communication: family was present at bedside, at the time of interview.  The pt provided permission to discuss medical plan with the family. Opportunity was given to ask question and all questions were answered satisfactorily.   Disposition:  Pt is from home, admitted with acute CVA, still has weakness on the right side, PT and OT eval done, recommended CIR, acute rehab.  Awaiting for insurance Auth and rehab placement. Discharge to CIR, when bed will be available.  Clinically patient is stable for discharge  Subjective: No significant events overnight, patient's weakness on the right side and right facial droop is getting better, still has some weakness and feels numbness on the face and right hand fingers. Denied any chest pain or palpitations, no any other complaints.  Physical Exam: General: NAD, lying comfortably Appear in no distress, affect appropriate Eyes: PERRLA ENT: Oral Mucosa Clear, moist  Neck: no JVD,  Cardiovascular: S1 and S2 Present, no Murmur,  Respiratory: good respiratory effort, Bilateral Air entry equal and Decreased, no Crackles, no wheezes Abdomen: Bowel Sound present, Soft and no tenderness,  Skin: no rashes Extremities: no Pedal edema, no calf tenderness Neurologic: Right facial droop, right hemiparesis power 4/5 Gait not checked due to patient safety concerns  Vitals:   11/17/23 0428 11/17/23 0558 11/17/23 0603 11/17/23 1249  BP: (!) 152/78 (!) 152/78  (!) 170/83  Pulse: 77 77  74  Resp: 18 18  16   Temp: (!) 97.5 F (36.4 C) (!) 97.5 F (36.4 C)  98.2 F (36.8 C)  TempSrc: Oral Oral    SpO2: 96%   95%  Weight:   92.2 kg   Height:  6' (1.829 m)      Intake/Output Summary (Last 24 hours) at 11/17/2023 1404 Last  data filed at 11/17/2023 0600 Gross per 24 hour  Intake 300 ml  Output 300 ml  Net 0 ml   Filed Weights   11/17/23 0603  Weight: 92.2 kg    Data Reviewed: I have personally reviewed and interpreted daily labs, tele strips, imagings as discussed above. I reviewed all nursing notes, pharmacy notes, vitals, pertinent old records I have discussed plan of care as described above with RN and patient/family.  CBC: Recent Labs  Lab 11/16/23 1500 11/17/23 0511  WBC 12.1* 8.6  NEUTROABS 10.3* 7.5  HGB 15.6 14.4  HCT 46.1 41.6  MCV 87.5 86.5  PLT 689* 498*   Basic Metabolic Panel: Recent Labs  Lab 11/16/23 1500 11/17/23 0511  NA 138 139  K 3.2* 3.6  CL 104 103  CO2 21* 28  GLUCOSE 184* 134*  BUN 15 16  CREATININE 0.89 0.98  CALCIUM 8.2* 8.6*  MG  --  2.1  PHOS  --  2.7    Studies: ECHOCARDIOGRAM COMPLETE Result Date: 11/17/2023    ECHOCARDIOGRAM REPORT   Patient Name:   [name] [name] Metivier Date of Exam: 11/17/2023 Medical Rec #:  [id number]           Height:       72.0 in Accession #:    [id number]          Weight:       203.3 lb Date of Birth:  [birth year]            BSA:          2.145 m Patient Age:    83 years            BP:           152/78 mmHg Patient Gender: M                   HR:           71 bpm. Exam Location:  [facility] Procedure: 2D Echo, Cardiac Doppler and Color Doppler (Both Spectral and Color            Flow Doppler were utilized during procedure). Indications:     Stroke  History:         Patient has no prior history of Echocardiogram examinations.                  Cardiomyopathy, CAD and Previous Myocardial Infarction, Stroke,                  Arrythmias:PVC; Risk Factors:Hypertension and Diabetes.  Sonographer:     Mikki Harbor Referring Phys:  [id number] Verdene Lennert Diagnosing Phys: Julien Nordmann MD  Sonographer Comments: Technically difficult study due to poor echo windows. IMPRESSIONS  1. Left ventricular ejection fraction, by estimation, is 40 to 45%. The left  ventricle has mildly decreased function. 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 There is moderate left ventricular hypertrophy. Left ventricular diastolic parameters are consistent with Grade I diastolic dysfunction (impaired relaxation).  2. Right ventricular systolic function is normal. The right ventricular size is normal.  3. The mitral valve is normal in structure. No evidence of mitral valve regurgitation. No evidence of mitral stenosis.  4. The aortic valve is calcified. There is mild calcification of the aortic valve. Aortic valve regurgitation is not visualized. Aortic valve sclerosis/calcification is present, without any evidence of aortic stenosis.  5. The inferior vena cava is normal in size with greater than 50% respiratory variability, suggesting right atrial pressure of 3 mmHg. FINDINGS  Left Ventricle: Left ventricular ejection fraction, by estimation, is 40 to 45%. The left ventricle has mildly decreased function. The left ventricle demonstrates regional wall motion abnormalities. Strain was performed and the global longitudinal strain is indeterminate. The left ventricular internal cavity size was normal in size. There is moderate left ventricular hypertrophy. Left ventricular diastolic parameters are consistent with Grade I diastolic dysfunction (impaired relaxation). Right Ventricle: The right ventricular size is normal. No increase in right ventricular wall thickness. Right ventricular systolic function is normal. Left Atrium: Left atrial size was normal in size. Right Atrium: Right atrial size was normal in size. Pericardium: There is no evidence of pericardial effusion. Mitral Valve: The mitral valve is normal in structure. No evidence of mitral valve regurgitation. No evidence of mitral valve stenosis. MV peak gradient, 3.3  mmHg. The mean mitral valve gradient is 1.0 mmHg. Tricuspid Valve: The tricuspid valve is normal in structure. Tricuspid valve regurgitation is mild . No evidence of tricuspid stenosis. Aortic Valve: The aortic valve  is calcified. There is mild calcification of the aortic valve. Aortic valve regurgitation is not visualized. Aortic valve sclerosis/calcification is present, without any evidence of aortic stenosis. Aortic valve mean gradient measures 3.0 mmHg. Aortic valve peak gradient measures 6.7 mmHg. Aortic valve area, by VTI measures 2.44 cm. Pulmonic Valve: The pulmonic valve was normal in structure. Pulmonic valve regurgitation is not visualized. No evidence of pulmonic stenosis. Aorta: The aortic root is normal in size and structure. Venous: The inferior vena cava is normal in size with greater than 50% respiratory variability, suggesting right atrial pressure of 3 mmHg. IAS/Shunts: No atrial level shunt detected by color flow Doppler. Additional Comments: 3D was performed not requiring image post processing on an independent workstation and was indeterminate.  LEFT VENTRICLE PLAX 2D LVIDd:         4.60 cm   Diastology LVIDs:         3.00 cm   LV e' medial:    5.29 cm/s LV PW:         1.40 cm   LV E/e' medial:  11.6 LV IVS:        1.60 cm   LV e' lateral:   6.46 cm/s LVOT diam:     2.00 cm   LV E/e' lateral: 9.5 LV SV:         63 LV SV Index:   29 LVOT Area:     3.14 cm  RIGHT VENTRICLE RV Basal diam:  4.20 cm RV Mid diam:    3.20 cm RV S prime:     16.90 cm/s LEFT ATRIUM           Index        RIGHT ATRIUM           Index LA diam:      3.90 cm 1.82 cm/m   RA Area:     18.90 cm LA Vol (A2C): 51.4 ml 23.96 ml/m  RA Volume:   55.50 ml  25.87 ml/m LA Vol (A4C): 41.0 ml 19.11 ml/m  AORTIC VALVE                    PULMONIC VALVE AV Area (Vmax):    2.39 cm     PV Vmax:       1.56 m/s AV Area (Vmean):   2.37 cm     PV Peak grad:  9.7 mmHg AV Area (VTI):     2.44 cm AV Vmax:           129.00 cm/s AV Vmean:           83.700 cm/s AV VTI:            0.259 m AV Peak Grad:      6.7 mmHg AV Mean Grad:      3.0 mmHg LVOT Vmax:         98.20 cm/s LVOT Vmean:        63.100 cm/s LVOT VTI:          0.201 m LVOT/AV VTI ratio: 0.78  AORTA Ao Root diam: 3.80 cm Ao Asc diam:  3.30 cm MITRAL VALVE               TRICUSPID VALVE MV Area (PHT): 3.36 cm    TR Peak grad:   13.0 mmHg MV Area VTI:   2.43 cm    TR Vmax:        180.00 cm/s MV Peak grad:  3.3 mmHg MV Mean  grad:  1.0 mmHg    SHUNTS MV Vmax:       0.91 m/s    Systemic VTI:  0.20 m MV Vmean:      52.5 cm/s   Systemic Diam: 2.00 cm MV Decel Time: 226 msec MV E velocity: 61.30 cm/s MV A velocity: 83.80 cm/s MV E/A ratio:  0.73 Julien Nordmann MD Electronically signed by Julien Nordmann MD Signature Date/Time: 11/17/2023/12:55:51 PM    Final    MR BRAIN WO CONTRAST Result Date: 11/16/2023 CLINICAL DATA:  Initial evaluation for acute neuro deficit, stroke suspected. EXAM: MRI HEAD WITHOUT CONTRAST TECHNIQUE: Multiplanar, multiecho pulse sequences of the brain and surrounding structures were obtained without intravenous contrast. COMPARISON:  CTs from earlier the same day. FINDINGS: Brain: Cerebral volume within normal limits. Patchy T2/FLAIR hyperintensity involving the periventricular and deep white matter both cerebral hemispheres, consistent with chronic small vessel ischemic disease, moderately advanced in nature. Few scatter remote lacunar infarcts present about the right frontal centrum semi ovale, basal ganglia, and thalami. Patchy restricted diffusion seen involving the cortical to subcortical aspect of the left frontal lobe, consistent with a small left MCA distribution infarct (series 5, image 45). Additional punctate subcentimeter acute ischemic infarct noted within the contralateral right basal ganglia (series 5, image 34). No associated hemorrhage or mass effect. No other evidence for acute or subacute ischemia. Gray-white matter differentiation otherwise maintained. No  acute intracranial hemorrhage. Few small foci of susceptibility artifact noted at the level of the left sylvian fissure, suspected to reflect thrombus within distal left MCA branches related to the overlying left MCA distribution infarct (series 12, image 43). No mass lesion, midline shift or mass effect. No hydrocephalus or extra-axial fluid collection. Pituitary gland within normal limits. Vascular: Loss of normal flow void within the left vertebral artery, consistent with previously identified chronic occlusion. Skull and upper cervical spine: Craniocervical junction within normal limits. Bone marrow signal intensity normal. No scalp soft tissue abnormality. Sinuses/Orbits: Globes orbital soft tissues within normal limits. Changes of chronic right maxillary sinus disease noted. Paranasal sinuses are otherwise largely clear. Trace left mastoid effusion noted, of doubtful significance. Other: None. IMPRESSION: 1. Small acute nonhemorrhagic left MCA distribution infarct involving the left frontal lobe. No associated mass effect. 2. Additional punctate subcentimeter acute ischemic nonhemorrhagic infarct within the contralateral right basal ganglia. 3. Underlying moderately advanced chronic microvascular ischemic disease with a few scattered remote lacunar infarcts as above. 4. Chronic occlusion of the left vertebral artery. Electronically Signed   By: Rise Mu M.D.   On: 11/16/2023 23:48   CT ANGIO HEAD NECK W WO CM (CODE STROKE) Result Date: 11/16/2023 CLINICAL DATA:  Code stroke, neuro deficit, right-sided facial droop, mild aphasia, left gaze preference. EXAM: CT ANGIOGRAPHY HEAD AND NECK WITH AND WITHOUT CONTRAST TECHNIQUE: Multidetector CT imaging of the head and neck was performed using the standard protocol during bolus administration of intravenous contrast. Multiplanar CT image reconstructions and MIPs were obtained to evaluate the vascular anatomy. Carotid stenosis measurements (when  applicable) are obtained utilizing NASCET criteria, using the distal internal carotid diameter as the denominator. RADIATION DOSE REDUCTION: This exam was performed according to the departmental dose-optimization program which includes automated exposure control, adjustment of the mA and/or kV according to patient size and/or use of iterative reconstruction technique. CONTRAST:  75mL OMNIPAQUE IOHEXOL 350 MG/ML SOLN COMPARISON:  Same-day head CT. FINDINGS: CTA NECK FINDINGS Aortic arch: Standard configuration of the aortic arch. Imaged portion shows no evidence of aneurysm or dissection.  No significant stenosis of the major arch vessel origins. Pulmonary arteries: As permitted by contrast timing, there are no filling defects in the visualized pulmonary arteries. Subclavian arteries: The subclavian arteries are patent bilaterally. Right carotid system: Patent. Mild atherosclerosis at the carotid bifurcation without hemodynamically significant stenosis. No evidence of dissection. Tortuosity of the mid cervical ICA. Left carotid system: Patent. Mild atherosclerosis at the carotid bifurcation without hemodynamically significant stenosis. No evidence of dissection. Vertebral arteries: Right vertebral artery is dominant. The right vertebral artery is patent from the origin to the vertebrobasilar confluence. Atherosclerosis of the right V4 segment resulting in mild-to-moderate stenosis. There is atherosclerosis at the origin of the non dominant left vertebral artery with limited evaluation of stenosis which is at least moderate in severity. The left vertebral artery is patent to the proximal V3 segment. The vertebral artery is occluded from this level to the vertebrobasilar confluence. Prominent focal atherosclerosis along the distal left V4 segment. Skeleton: No acute findings. Degenerative changes in the cervical spine. Intervertebral disc space narrowing greatest at C5-6 through C7-T1. Edentulous maxilla and mandible.  Mucosal thickening in the right maxillary sinus with findings suggestive of mucoperiosteal reaction. Other neck: The visualized airway is patent. No cervical lymphadenopathy. Subcentimeter thyroid nodules. Upper chest: Centrilobular emphysema in the visualized upper lobes with additional paraseptal emphysema in the lung apices most pronounced on the right. Review of the MIP images confirms the above findings CTA HEAD FINDINGS ANTERIOR CIRCULATION: The intracranial ICAs are patent bilaterally. Atherosclerosis of the bilateral carotid siphons. There is mild stenosis of the right supraclinoid ICA. No high-grade stenosis, proximal occlusion, aneurysm, or vascular malformation. MCAs: The right M1 segment is patent. There is severe stenosis at the origin of a M2 superior division branch. Short-segment occlusion of an M2 inferior division branch with reconstitution within the sylvian fissure. The left M1 segment is patent. There is mild narrowing of an M2 inferior division branch. Severe stenosis and subtotal occlusion of an M2 inferior division branch of the left MCA with additional multifocal severe stenosis of M3 and M4 branches within this distribution. ACAs: The A1 and A2 segments are patent bilaterally. Moderate stenosis of the bilateral A3 segments. Additional focal severe stenosis of the left A4 segment. POSTERIOR CIRCULATION: PCAs: Patent bilaterally. Focal severe stenosis of the proximal P3 segment right PCA. Additional severe stenosis of the posterior P2 segment and proximal P3 segment of the left PCA. Pcomm: Visualized on the right. SCAs: The superior cerebellar arteries are patent bilaterally. Basilar artery: Patent AICAs: Patent PICAs: Visualized on the right. Vertebral arteries: Patent on the right. Venous sinuses: As permitted by contrast timing, patent. Anatomic variants: None Review of the MIP images confirms the above findings IMPRESSION: No large vessel occlusion or evidence of findings amenable to  neurovascular intervention. 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 Multiple intracranial vascular stenoses as above. Focal short segment occlusion of an M2 inferior division branch of the right MCA with reconstitution noted. Mild-to-moderate stenosis of the V4 segment right vertebral artery. Emphysema (ICD10-J43.9). Electronically Signed   By: Emily Filbert M.D.   On: 11/16/2023 15:50   CT HEAD CODE STROKE WO CONTRAST Result Date: 11/16/2023 CLINICAL DATA:  Code stroke. Right facial droop and slurred speech. Last known well at 9:15 a.m. EXAM: CT HEAD WITHOUT CONTRAST TECHNIQUE: Contiguous axial images were obtained from the base of the skull through the vertex without intravenous contrast. RADIATION  DOSE REDUCTION: This exam was performed according to the departmental dose-optimization program which includes automated exposure control, adjustment of the mA and/or kV according to patient size and/or use of iterative reconstruction technique. COMPARISON:  None Available. FINDINGS: Brain: Moderate atrophy and white matter changes are present bilaterally. Age indeterminate infarcts are present in the anterior thalami bilaterally, more prominent right than left. Subtle hypoattenuation is present at the anterior limb of the right internal capsule. A subcortical white matter infarct is present in the anterior right frontal lobe superior to the frontal horn of the right lateral ventricle. An age indeterminate infarct is present in the left lentiform nucleus. No other acute cortical infarcts are present. No acute hemorrhage or mass lesion is present. No significant extra-axial fluid collections are present. The ventricles are proportionate to the degree of atrophy. The brainstem and cerebellum are within normal limits. Midline structures are within normal limits. Vascular: Atherosclerotic calcifications are  present at the cavernous internal carotid arteries and at the dural margin of both vertebral arteries. No hyperdense vessel is present. Skull: Calvarium is intact. No focal lytic or blastic lesions are present. No significant extracranial soft tissue lesion is present. Sinuses/Orbits: The paranasal sinuses and mastoid air cells are clear. The globes and orbits are within normal limits. ASPECTS [facility] Stroke Program Early CT Score) - Ganglionic level infarction (caudate, lentiform nuclei, internal capsule, insula, M1-M3 cortex): 6/7 - Supraganglionic infarction (M4-M6 cortex): 3/3 Total score (0-10 with 10 being normal): 9/10 IMPRESSION: 1. 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 2. Subtle hypoattenuation at the anterior limb of the right internal capsule. 3. 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 4. Age indeterminate infarct in the left lentiform nucleus. 5. Moderate atrophy and white matter disease likely reflects the sequela of chronic microvascular ischemia. 6. No acute hemorrhage or mass lesion. These results were called by telephone at the time of interpretation on 11/16/2023 at 3:13 pm to provider Dr. Selina Cooley, who verbally acknowledged these results. Electronically Signed   By: Marin Roberts M.D.   On: 11/16/2023 15:14    Scheduled Meds:  [START ON 11/18/2023] aspirin EC  81 mg Oral Daily   clopidogrel  75 mg Oral Daily   enoxaparin (LOVENOX) injection  0.5 mg/kg (Order-Specific) Subcutaneous Q24H   insulin aspart  0-15 Units Subcutaneous TID WC   rosuvastatin  20 mg Oral Daily   Continuous Infusions: PRN Meds: acetaminophen **OR** acetaminophen (TYLENOL) oral liquid 160 mg/5 mL **OR** acetaminophen, ondansetron (ZOFRAN) IV, senna-docusate  Time spent: 55 minutes  Author: Gillis Santa. MD [facility] 11/17/2023 2:04 PM  To reach On-call,  see care teams to locate the attending and reach out to them via [email or web address]. If 7PM-7AM, please contact night-coverage If you still have difficulty reaching the attending provider, please page the [facility] (Director on Call) for [facility] on amion for assistance.

## 2023-11-17 NOTE — Evaluation (Signed)
 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 No pertinent surgical history. HPI:  [name] [name] is a 83 y.o. male who presented to the ED with stroke symptoms. 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 Additional punctate subcentimeter acute ischemic nonhemorrhagic infarct was also present within the contralateral right basal ganglia. 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   Assessment / Plan / Recommendation Clinical Impression  Pt presents with moderate speech impairment that is c/b flaccid right facial weakness, reduced vocal intensity, imprecise articulation and some garbled speech. This results in ~ 25% speech intelligibility at the phrase level with minimal improvement when asked to repeat himself. His family was present and they report increased frustration with inability to effectively communicate. While pt is not able to demonstrate independent orientation, when given 2  verbal choices, pt is able to identify correct option. Informally, pt was able to name 10 basic objects around the room, tell me the names of people present and their relationship to him - he even joked x 1. Receptive language appears to be a relative strength as he was able to answer basic yes/no questions. While more formal assessment is needed, he appears to be a great candidate for intense rehabilitation at discharge. All questions were answered about the referral as well as program. ST services to follow acutely.    SLP Assessment  SLP Recommendation/Assessment: Patient needs continued Speech Lanaguage Pathology Services SLP Visit Diagnosis: Dysarthria and anarthria (R47.1)    Recommendations for follow up therapy are one component of a multi-disciplinary discharge planning process, led by the attending physician.  Recommendations may be updated based on patient status, additional functional criteria and insurance authorization.    Follow Up Recommendations  Acute inpatient rehab (3hours/day)    Assistance Recommended at Discharge  Intermittent Supervision/Assistance  Functional Status Assessment Patient has had a recent decline in their functional status and demonstrates the ability to make significant improvements in function in a reasonable and predictable amount of time.  Frequency and Duration min 2x/week  2 weeks      SLP Evaluation Cognition  Overall Cognitive Status: Impaired/Different from baseline Arousal/Alertness: Awake/alert Orientation Level: Oriented to person;Disoriented to place;Disoriented to time;Disoriented to situation       Comprehension  Auditory Comprehension Overall Auditory Comprehension: Appears within functional limits for tasks assessed Yes/No Questions: Within Functional Limits    Expression Expression Primary Mode of Expression: Verbal Verbal Expression Overall Verbal Expression: Impaired Initiation: Impaired Automatic Speech: Name Level of  Generative/Spontaneous Verbalization: Word;Phrase Repetition: No impairment Naming:  (  informal object naming was good) Pragmatics: No impairment Interfering Components: Speech intelligibility Non-Verbal Means of Communication: Not applicable   Oral / Motor  Oral Motor/Sensory Function Overall Oral Motor/Sensory Function: Moderate impairment Facial ROM: Reduced right;Suspected CN VII (facial) dysfunction Facial Symmetry: Abnormal symmetry right;Suspected CN VII (facial) dysfunction Facial Strength: Reduced right;Suspected CN VII (facial) dysfunction Facial Sensation: Reduced right;Suspected CN V (Trigeminal) dysfunction Lingual ROM: Reduced right;Suspected CN XII (hypoglossal) dysfunction Velum: Within Functional Limits Mandible: Within Functional Limits Motor Speech Overall Motor Speech: Impaired Respiration: Within functional limits Phonation: Low vocal intensity Resonance: Within functional limits Articulation: Impaired Level of Impairment: Word Intelligibility: Intelligibility reduced Word: 25-49% accurate Phrase: 0-24% accurate Sentence: Not tested Conversation: Not tested Motor Planning: Witnin functional limits Motor Speech Errors: Not applicable Effective Techniques: Slow rate;Increased vocal intensity           Lavonda Thal B. Dreama Saa, M.S., CCC-SLP, Tree surgeon Certified Brain Injury Specialist [facility]  [facility] Office [phone] Ascom [phone] Fax [phone]

## 2023-11-17 NOTE — Progress Notes (Signed)
 Patient tried to get out of bed 3 times , and took off iv , refused new iv inserting.

## 2023-11-18 DIAGNOSIS — I639 Cerebral infarction, unspecified: Secondary | ICD-10-CM | POA: Diagnosis not present

## 2023-11-18 LAB — GLUCOSE, CAPILLARY
Glucose-Capillary: 118 mg/dL — ABNORMAL HIGH (ref 70–99)
Glucose-Capillary: 121 mg/dL — ABNORMAL HIGH (ref 70–99)
Glucose-Capillary: 140 mg/dL — ABNORMAL HIGH (ref 70–99)

## 2023-11-18 LAB — PHOSPHORUS: Phosphorus: 2.2 mg/dL — ABNORMAL LOW (ref 2.5–4.6)

## 2023-11-18 LAB — CBC
HCT: 46.6 % (ref 39.0–52.0)
Hemoglobin: 16.1 g/dL (ref 13.0–17.0)
MCH: 29.5 pg (ref 26.0–34.0)
MCHC: 34.5 g/dL (ref 30.0–36.0)
MCV: 85.3 fL (ref 80.0–100.0)
Platelets: 527 10*3/uL — ABNORMAL HIGH (ref 150–400)
RBC: 5.46 MIL/uL (ref 4.22–5.81)
RDW: 13.6 % (ref 11.5–15.5)
WBC: 7 10*3/uL (ref 4.0–10.5)
nRBC: 0 % (ref 0.0–0.2)

## 2023-11-18 LAB — BASIC METABOLIC PANEL WITH GFR
Anion gap: 9 (ref 5–15)
BUN: 16 mg/dL (ref 8–23)
CO2: 26 mmol/L (ref 22–32)
Calcium: 8.8 mg/dL — ABNORMAL LOW (ref 8.9–10.3)
Chloride: 104 mmol/L (ref 98–111)
Creatinine, Ser: 0.85 mg/dL (ref 0.61–1.24)
GFR, Estimated: >60 mL/min (ref >60)
Glucose, Bld: 123 mg/dL — ABNORMAL HIGH (ref 70–99)
Potassium: 3.5 mmol/L (ref 3.5–5.1)
Sodium: 139 mmol/L (ref 135–145)

## 2023-11-18 LAB — MAGNESIUM: Magnesium: 2.1 mg/dL (ref 1.7–2.4)

## 2023-11-18 MED ORDER — VITAMIN D (ERGOCALCIFEROL) 1.25 MG (50000 UNIT) PO CAPS
50000.0000 [IU] | ORAL_CAPSULE | ORAL | Status: DC
  Administered 2023-11-18: 50000 [IU] via ORAL
  Filled 2023-11-18: qty 1

## 2023-11-18 MED ORDER — POTASSIUM CHLORIDE CRYS ER 20 MEQ PO TBCR
40.0000 meq | EXTENDED_RELEASE_TABLET | Freq: Once | ORAL | Status: AC
  Administered 2023-11-18: 40 meq via ORAL
  Filled 2023-11-18: qty 2

## 2023-11-18 MED ORDER — METOPROLOL TARTRATE 25 MG PO TABS
25.0000 mg | ORAL_TABLET | Freq: Two times a day (BID) | ORAL | Status: DC
  Administered 2023-11-18 – 2023-11-19 (×3): 25 mg via ORAL
  Filled 2023-11-18 (×3): qty 1

## 2023-11-18 MED ORDER — QUETIAPINE FUMARATE 25 MG PO TABS
25.0000 mg | ORAL_TABLET | ORAL | Status: DC
  Administered 2023-11-18: 25 mg via ORAL
  Filled 2023-11-18: qty 1

## 2023-11-18 MED ORDER — K PHOS MONO-SOD PHOS DI & MONO 155-852-130 MG PO TABS
500.0000 mg | ORAL_TABLET | Freq: Four times a day (QID) | ORAL | Status: AC
Start: 2023-11-18 — End: 2023-11-18
  Administered 2023-11-18 (×2): 500 mg via ORAL
  Filled 2023-11-18 (×2): qty 2

## 2023-11-18 NOTE — Plan of Care (Signed)
 Problem: Education: Goal: Knowledge of General Education information will improve Description: Including pain rating scale, medication(s)/side effects and non-pharmacologic comfort measures Outcome: Progressing   Problem: Health Behavior/Discharge Planning: Goal: Ability to manage health-related needs will improve Outcome: Progressing   Problem: Clinical Measurements: Goal: Ability to maintain clinical measurements within normal limits will improve Outcome: Progressing Goal: Will remain free from infection Outcome: Progressing Goal: Diagnostic test results will improve Outcome: Progressing Goal: Respiratory complications will improve Outcome: Progressing Goal: Cardiovascular complication will be avoided Outcome: Progressing   Problem: Activity: Goal: Risk for activity intolerance will decrease Outcome: Progressing   Problem: Nutrition: Goal: Adequate nutrition will be maintained Outcome: Progressing   Problem: Coping: Goal: Level of anxiety will decrease Outcome: Progressing   Problem: Elimination: Goal: Will not experience complications related to bowel motility Outcome: Progressing Goal: Will not experience complications related to urinary retention Outcome: Progressing   Problem: Pain Managment: Goal: General experience of comfort will improve and/or be controlled Outcome: Progressing   Problem: Safety: Goal: Ability to remain free from injury will improve Outcome: Progressing   Problem: Skin Integrity: Goal: Risk for impaired skin integrity will decrease Outcome: Progressing   Problem: Education: Goal: Knowledge of disease or condition will improve Outcome: Progressing Goal: Knowledge of secondary prevention will improve (MUST DOCUMENT ALL) Outcome: Progressing Goal: Knowledge of patient specific risk factors will improve (DELETE if not current risk factor) Outcome: Progressing   Problem: Ischemic Stroke/TIA Tissue Perfusion: Goal: Complications of  ischemic stroke/TIA will be minimized Outcome: Progressing   Problem: Coping: Goal: Will verbalize positive feelings about self Outcome: Progressing Goal: Will identify appropriate support needs Outcome: Progressing   Problem: Health Behavior/Discharge Planning: Goal: Ability to manage health-related needs will improve Outcome: Progressing Goal: Goals will be collaboratively established with patient/family Outcome: Progressing   Problem: Self-Care: Goal: Ability to participate in self-care as condition permits will improve Outcome: Progressing Goal: Verbalization of feelings and concerns over difficulty with self-care will improve Outcome: Progressing Goal: Ability to communicate needs accurately will improve Outcome: Progressing   Problem: Nutrition: Goal: Risk of aspiration will decrease Outcome: Progressing Goal: Dietary intake will improve Outcome: Progressing   Problem: Education: Goal: Ability to describe self-care measures that may prevent or decrease complications (Diabetes Survival Skills Education) will improve Outcome: Progressing Goal: Individualized Educational Video(s) Outcome: Progressing   Problem: Coping: Goal: Ability to adjust to condition or change in health will improve Outcome: Progressing   Problem: Fluid Volume: Goal: Ability to maintain a balanced intake and output will improve Outcome: Progressing   Problem: Health Behavior/Discharge Planning: Goal: Ability to identify and utilize available resources and services will improve Outcome: Progressing Goal: Ability to manage health-related needs will improve Outcome: Progressing   Problem: Metabolic: Goal: Ability to maintain appropriate glucose levels will improve Outcome: Progressing   Problem: Education: Goal: Knowledge of disease or condition will improve Outcome: Progressing Goal: Knowledge of secondary prevention will improve (MUST DOCUMENT ALL) Outcome: Progressing Goal: Knowledge  of patient specific risk factors will improve (DELETE if not current risk factor) Outcome: Progressing   Problem: Ischemic Stroke/TIA Tissue Perfusion: Goal: Complications of ischemic stroke/TIA will be minimized Outcome: Progressing   Problem: Coping: Goal: Will verbalize positive feelings about self Outcome: Progressing Goal: Will identify appropriate support needs Outcome: Progressing   Problem: Health Behavior/Discharge Planning: Goal: Ability to manage health-related needs will improve Outcome: Progressing Goal: Goals will be collaboratively established with patient/family Outcome: Progressing   Problem: Self-Care: Goal: Ability to participate in self-care as condition permits will improve  Outcome: Progressing Goal: Verbalization of feelings and concerns over difficulty with self-care will improve Outcome: Progressing Goal: Ability to communicate needs accurately will improve Outcome: Progressing   Problem: Nutrition: Goal: Risk of aspiration will decrease Outcome: Progressing Goal: Dietary intake will improve Outcome: Progressing

## 2023-11-18 NOTE — Progress Notes (Signed)
 Physical Therapy Treatment Patient Details Name: [name] [name] MRN: [id number] DOB: 1940/11/10 Today's Date: 11/18/2023   History of Present Illness Pt is an 83 y.o. male who presents to the ED due to stroke symptoms. 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 Additional punctate subcentimeter acute ischemic nonhemorrhagic infarct within the contralateral right basal ganglia.  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    PT Comments  Pt progressed with dynamic sitting balance EOB requiring MinA -supervision level.  Pt continues to require Max to Mod Cues for sequencing and body mechanics for bed mobility, tranfers, and gait and Mod A +2 for mobility 2/2 heavy lean to R side.  Pt progressed with gait distance (15'+10') with cues needed to increase step length B, R>L. Cues needed for upright posture, leans to the R. Facilitation to increase R LE steplength progress to intermittent step-through pattern. Continued PT will assist pt towards greater dynamic standing balance, LE strengthening, and activity tolerance to increase safety and independence and decrease burden of care with functional mobility.     If plan is discharge home, recommend the following: A lot of help with walking and/or transfers;A lot of help with bathing/dressing/bathroom   Can travel by private vehicle        Equipment Recommendations  None recommended by PT    Recommendations for Other Services       Precautions / Restrictions Precautions Precautions: Fall Recall of Precautions/Restrictions: Impaired Precaution/Restrictions Comments: per chart, pt has been trying to get up by himself at times. Restrictions Weight Bearing Restrictions Per Provider Order: No     Mobility  Bed Mobility Overal bed mobility: Needs Assistance Bed Mobility: Supine to Sit, Rolling Rolling:  Mod assist   Supine to sit: Mod assist, Used rails     General bed mobility comments: Max cues for sequencing and body mechanics to sit up and scoot to the EOB.    Transfers Overall transfer level: Needs assistance Equipment used: Rolling walker (2 wheels) Transfers: Sit to/from Stand Sit to Stand: +2 safety/equipment, From elevated surface, Mod assist   Step pivot transfers: Mod assist, +2 physical assistance, +2 safety/equipment       General transfer comment: increased cueing, time for processing/weight shifting anterior, leans to the R side; RW management needed    Ambulation/Gait Ambulation/Gait assistance: +2 safety/equipment, Mod assist Gait Distance (Feet): 15 Feet (+10) Assistive device: Rolling walker (2 wheels) Gait Pattern/deviations: Step-to pattern, Decreased step length - right, Decreased step length - left, Decreased stride length, Step-through pattern Gait velocity: decreased     General Gait Details: Cues needed to increase step length B, R>L. Cues needed for upright posture, leans to the R. Facilitation to increase R LE steplength progress to intermittent step-through pattern.   Stairs             Wheelchair Mobility     Tilt Bed    Modified Rankin (Stroke Patients Only)       Balance Overall balance assessment: Needs assistance Sitting-balance support: Feet supported, Bilateral upper extremity supported Sitting balance-Leahy Scale: Poor Sitting balance - Comments: needed Min A to supervision  for seated balance with cueing for anterior weight shift;  worked on trunk control training progressing to no UE support and upper trunk rotations at supervision level. Postural control: Posterior lean, Right lateral lean Standing balance support: Bilateral upper extremity supported, During functional activity, Reliant on assistive  device for balance Standing balance-Leahy Scale: Poor Standing balance comment: +2 for safety; heavy lean to R at times  requiring Mod A.                            Communication Communication Communication: Impaired Factors Affecting Communication: Hearing impaired;Difficulty expressing self;Reduced clarity of speech  Cognition Arousal: Alert Behavior During Therapy: Lability (teary during session.)   PT - Cognitive impairments: Problem solving, Sequencing, Initiation, Safety/Judgement                         Following commands: Impaired Following commands impaired: Follows one step commands with increased time    Cueing Cueing Techniques: Verbal cues  Exercises      General Comments        Pertinent Vitals/Pain Pain Assessment Pain Assessment: No/denies pain    Home Living                          Prior Function            PT Goals (current goals can now be found in the care plan section) Acute Rehab PT Goals Patient Stated Goal: Son reports he wants to bring patient home, son/dtr in law can provide 24 hour supervision/assist. Dtr in law works from home. PT Goal Formulation: With family Time For Goal Achievement: 12/01/23 Potential to Achieve Goals: Good Progress towards PT goals: Progressing toward goals    Frequency    7X/week      PT Plan      Co-evaluation              AM-PAC PT "6 Clicks" Mobility   Outcome Measure  Help needed turning from your back to your side while in a flat bed without using bedrails?: A Lot Help needed moving from lying on your back to sitting on the side of a flat bed without using bedrails?: A Lot Help needed moving to and from a bed to a chair (including a wheelchair)?: A Lot Help needed standing up from a chair using your arms (e.g., wheelchair or bedside chair)?: A Lot Help needed to walk in hospital room?: A Lot Help needed climbing 3-5 steps with a railing? : A Lot 6 Click Score: 12    End of Session Equipment Utilized During Treatment: Gait belt Activity Tolerance: Patient limited by  fatigue Patient left: in chair;with call bell/phone within reach;with chair alarm set;with family/visitor present Nurse Communication: Mobility status PT Visit Diagnosis: Other abnormalities of gait and mobility (R26.89);Muscle weakness (generalized) (M62.81);Difficulty in walking, not elsewhere classified (R26.2);Unsteadiness on feet (R26.81);Hemiplegia and hemiparesis Hemiplegia - Right/Left: Right Hemiplegia - dominant/non-dominant: Dominant Hemiplegia - caused by: Cerebral infarction     Time: 1056-1130 PT Time Calculation (min) (ACUTE ONLY): 34 min  Charges:    $Therapeutic Activity: 8-22 mins $Neuromuscular Re-education: 8-22 mins PT General Charges $$ ACUTE PT VISIT: 1 Visit                     Eliazar Gross, PTA  11/18/23, 12:47 PM

## 2023-11-18 NOTE — Progress Notes (Signed)
 [facility] Progress Note  Patient: [name] [name]    MWU:[id number]  DOA: 11/16/2023     Date of Service: the patient was seen and examined on 11/18/2023  Chief Complaint  Patient presents with   Code Stroke   Brief hospital course:  [name] [name] is a 83 y.o. 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   History obtained predominantly from patient's family at bedside due to patient's difficulty speaking.  His son [name] [name] states that he checked on [name] [name] at approximately 9-9:30 AM and he seemed at his baseline.  Then his brother went to go see him him again at approximately 2 PM and noted that he had a severe right-sided facial droop with drooling out the side of his mouth and was unable to speak coherently.  Due to this, an ambulance was called.  [name] [name] states that 3-4 months ago, patient had an episode of vomiting with inability to walk due to bilateral lower extremity weakness.  He slowly recovered over 3-4 weeks, but wonders if this was also a stroke.  [name] [name] also notes that patient has not been taking any of his home medications and has not seen any physician in over 2 years.   [name] [name] denies any recent illness, nausea, vomiting, chest pain, shortness of breath, palpitations.   ED course: On arrival to the ED, patient was hypertensive at 176/92 with heart rate of 89.  He was saturating at 96% on room air.  He was afebrile. Initial workup notable for WBC at 12.1, platelets 689, potassium 3.2, glucose 184, creatinine 0.89 with GFR above 60.  CT head with no acute infarct.  CTA head/neck with no LVO.  Neurology consulted.  [facility] contacted for admission.    Assessment and Plan:  # Acute CVA (cerebrovascular accident) [facility]) Patient is presenting with right-sided facial droop, right  weakness and slurred speech with symptoms beginning sometime between 9:30 AM and 2 PM today.  No evidence of LVO on imaging and outside of the window for TNK. MRI positive for acute CVA, please review complete report. LDL 100 goal <70, TSH 0.6 at lower end, free T41.0 Within normal range HbA1c pending Allow permissive hypertension 48 hrs and then normotensive TTE shows LVEF 40 to 45%, grade 1 diastolic dysfunction, no any other significant findings Neurology consulted, started aspirin and Plavix, recommended DAPT for 90 days f/b Plavix 75 mg daily monotherapy. Started Crestor 20 mg p.o. daily PT and OT eval done, recommended CIR SLP eval done, started heart healthy and carb modified diet Continue aspiration precautions, fall precautions. Continue to monitor on telemetry Cardiology involved to arrange cardiac monitor to rule out occult arrhythmias Follow-up with neurology in 1 to 2 weeks as an outpatient     # Thrombocytosis Per chart review, patient has a history of chronic thrombocytosis of unknown etiology dating back to at least 2016.  Stable at this time. - Outpatient follow-up with oncology to establish etiology Continue DAPT as above    # Ischemic cardiomyopathy Per chart review, patient has a history of ischemic cardiomyopathy with last ultrasound available 2019 that demonstrated mildly reduced LVEF at 45%.  No follow-up since then. 4/11 TTE shows LVEF 40 to 45%, grade 1 diastolic dysfunction, no any other significant findings Need to start GDMT after 2 to 3 days of permissive hypertension  #  Hypertension S/p s/p permissive hypertension for 2 days, started Lopressor 25 mg p.o. twice daily with holding parameters    # Coronary artery disease Per chart review, patient has a history of CAD s/p bare-metal stent to mid LAD in 2012.  Last follow-up with cardiology was in 2019. Patient stopped taking all home medications. Continue aspirin and Plavix as above   # Type 2 diabetes  mellitus (HCC) History of type 2 diabetes with last A1c available in 2023, at 6.1%. - A1c pending - SSI, moderate  # Hypokalemia, resolved. # Hypophosphatemia due to nutritional deficiency.  Phos repleted. Monitor electrolytes.  # Acute delirium and sundowning, started Seroquel 25 mg p.o. after dinner   Body mass index is 27.57 kg/m.  Interventions:  Diet: Heart healthy carb modified DVT Prophylaxis: Subcutaneous Lovenox   Advance goals of care discussion: Full code  Family Communication: family was present at bedside, at the time of interview.  The pt provided permission to discuss medical plan with the family. Opportunity was given to ask question and all questions were answered satisfactorily.   Disposition:  Pt is from home, admitted with acute CVA, still has weakness on the right side, PT and OT eval done, recommended CIR, acute rehab.   Discharge to CIR, insurance Auth approved, patient will be transferred to CIR tomorrow a.m.   Subjective: No significant events overnight except patient got little bit confused, slept well.  Denied any new neurological complaints, gradually getting better.  Patient was little bit sleepy.  Management plan discussed with patient's family at bedside.   Physical Exam: General: NAD, lying comfortably Appear in no distress, affect appropriate Eyes: PERRLA ENT: Oral Mucosa Clear, moist  Neck: no JVD,  Cardiovascular: S1 and S2 Present, no Murmur,  Respiratory: good respiratory effort, Bilateral Air entry equal and Decreased, no Crackles, no wheezes Abdomen: Bowel Sound present, Soft and no tenderness,  Skin: no rashes Extremities: no Pedal edema, no calf tenderness Neurologic: Right facial droop, right hemiparesis power 4/5 Gait not checked due to patient safety concerns  Vitals:   11/18/23 0014 11/18/23 0427 11/18/23 0736 11/18/23 1201  BP: (!) 184/82 (!) 188/98 (!) 171/96 (!) 158/93  Pulse: 80 81 90 (!) 101  Resp:  20 20   Temp:  98.2 F (36.8 C) 98.5 F (36.9 C) 98.7 F (37.1 C)   TempSrc:      SpO2: 93% 96% 93%   Weight:      Height:        Intake/Output Summary (Last 24 hours) at 11/18/2023 1427 Last data filed at 11/18/2023 1041 Gross per 24 hour  Intake 270 ml  Output 980 ml  Net -710 ml   Filed Weights   11/17/23 0603  Weight: 92.2 kg    Data Reviewed: I have personally reviewed and interpreted daily labs, tele strips, imagings as discussed above. I reviewed all nursing notes, pharmacy notes, vitals, pertinent old records I have discussed plan of care as described above with RN and patient/family.  CBC: Recent Labs  Lab 11/16/23 1500 11/17/23 0511 11/18/23 0531  WBC 12.1* 8.6 7.0  NEUTROABS 10.3* 7.5  --   HGB 15.6 14.4 16.1  HCT 46.1 41.6 46.6  MCV 87.5 86.5 85.3  PLT 689* 498* 527*   Basic Metabolic Panel: Recent Labs  Lab 11/16/23 1500 11/17/23 0511 11/18/23 0531  NA 138 139 139  K 3.2* 3.6 3.5  CL 104 103 104  CO2 21* 28 26  GLUCOSE 184* 134* 123*  BUN 15  16 16  CREATININE 0.89 0.98 0.85  CALCIUM 8.2* 8.6* 8.8*  MG  --  2.1 2.1  PHOS  --  2.7 2.2*    Studies: No results found.   Scheduled Meds:  aspirin EC  81 mg Oral Daily   clopidogrel  75 mg Oral Daily   enoxaparin (LOVENOX) injection  0.5 mg/kg (Order-Specific) Subcutaneous Q24H   insulin aspart  0-15 Units Subcutaneous TID WC   melatonin  5 mg Oral Once   metoprolol tartrate  25 mg Oral BID   phosphorus  500 mg Oral QID   QUEtiapine  25 mg Oral PC supper   rosuvastatin  20 mg Oral Daily   Vitamin D (Ergocalciferol)  50,000 Units Oral Q7 days   Continuous Infusions: PRN Meds: acetaminophen **OR** acetaminophen (TYLENOL) oral liquid 160 mg/5 mL **OR** acetaminophen, ondansetron (ZOFRAN) IV, senna-docusate  Time spent: 45 minutes  Author: Althia Atlas. MD [facility] 11/18/2023 2:27 PM  To reach On-call, see care teams to locate the attending and reach out to them via [email or web address]. If 7PM-7AM,  please contact night-coverage If you still have difficulty reaching the attending provider, please page the [facility] (Director on Call) for [facility] on amion for assistance.

## 2023-11-18 NOTE — Progress Notes (Signed)
 Mobility Specialist - Progress Note   11/18/23 1438  Mobility  Activity Transferred from chair to bed;Stood at bedside  Level of Assistance +2 (takes two people)  Assistive Device [employer] Ambulated (ft) 0 ft  Activity Response Tolerated well  Mobility Referral Yes  Mobility visit 1 Mobility  Mobility Specialist Start Time (ACUTE ONLY) 1254  Mobility Specialist Stop Time (ACUTE ONLY) 1328  Mobility Specialist Time Calculation (min) (ACUTE ONLY) 34 min   Pt sitting in recliner on RA upon arrival. Pt STS ModA +2 but unable to take steps to bed. Pt transfer from recliner to bed +2 with stedy. Pt left in bed with needs in reach and bed alarm activated.   Wash Hack  Mobility Specialist  11/18/23 2:39 PM

## 2023-11-18 NOTE — Plan of Care (Signed)

## 2023-11-18 NOTE — Progress Notes (Signed)
 Patient became agitated and tried to get out bed few times last night , informed attending NP Jacqualine Mater  Ordered tele-sitter .   [name] , [name]

## 2023-11-18 NOTE — Progress Notes (Signed)
 Inpatient Rehab Admissions Coordinator:    I received insurance authorization and will plan for admit tomorrow. I will follow up in the morning.   Wandalee Gust, MS, CCC-SLP Rehab Admissions Coordinator  [phone] (celll) [phone] (office)

## 2023-11-18 NOTE — TOC CM/SW Note (Signed)
 Awaiting CIR insurance auth.  Eleonore Shippee, LCSW Transitions of Care Department [id number]

## 2023-11-19 ENCOUNTER — Encounter (HOSPITAL_COMMUNITY): Payer: Self-pay | Admitting: Physical Medicine and Rehabilitation

## 2023-11-19 ENCOUNTER — Other Ambulatory Visit: Payer: Self-pay

## 2023-11-19 ENCOUNTER — Inpatient Hospital Stay (HOSPITAL_COMMUNITY)
Admission: RE | Admit: 2023-11-19 | Discharge: 2023-12-06 | DRG: 057 | Disposition: A | Discharge status: Home / Self Care | Source: Other Acute Inpatient Hospital | Attending: Physical Medicine and Rehabilitation | Admitting: Physical Medicine and Rehabilitation

## 2023-11-19 DIAGNOSIS — E1169 Type 2 diabetes mellitus with other specified complication: Secondary | ICD-10-CM | POA: Diagnosis not present

## 2023-11-19 DIAGNOSIS — E875 Hyperkalemia: Secondary | ICD-10-CM | POA: Diagnosis present

## 2023-11-19 DIAGNOSIS — Z87891 Personal history of nicotine dependence: Secondary | ICD-10-CM | POA: Diagnosis not present

## 2023-11-19 DIAGNOSIS — F32A Depression, unspecified: Secondary | ICD-10-CM | POA: Diagnosis present

## 2023-11-19 DIAGNOSIS — I639 Cerebral infarction, unspecified: Secondary | ICD-10-CM | POA: Diagnosis not present

## 2023-11-19 DIAGNOSIS — Z7982 Long term (current) use of aspirin: Secondary | ICD-10-CM

## 2023-11-19 DIAGNOSIS — I5042 Chronic combined systolic (congestive) and diastolic (congestive) heart failure: Secondary | ICD-10-CM | POA: Diagnosis present

## 2023-11-19 DIAGNOSIS — G4700 Insomnia, unspecified: Secondary | ICD-10-CM | POA: Diagnosis present

## 2023-11-19 DIAGNOSIS — G214 Vascular parkinsonism: Secondary | ICD-10-CM | POA: Diagnosis present

## 2023-11-19 DIAGNOSIS — I255 Ischemic cardiomyopathy: Secondary | ICD-10-CM | POA: Diagnosis present

## 2023-11-19 DIAGNOSIS — Z7902 Long term (current) use of antithrombotics/antiplatelets: Secondary | ICD-10-CM

## 2023-11-19 DIAGNOSIS — I69354 Hemiplegia and hemiparesis following cerebral infarction affecting left non-dominant side: Secondary | ICD-10-CM | POA: Diagnosis not present

## 2023-11-19 DIAGNOSIS — I10 Essential (primary) hypertension: Secondary | ICD-10-CM | POA: Diagnosis present

## 2023-11-19 DIAGNOSIS — E119 Type 2 diabetes mellitus without complications: Secondary | ICD-10-CM | POA: Diagnosis present

## 2023-11-19 DIAGNOSIS — E559 Vitamin D deficiency, unspecified: Secondary | ICD-10-CM | POA: Diagnosis present

## 2023-11-19 DIAGNOSIS — E663 Overweight: Secondary | ICD-10-CM | POA: Diagnosis present

## 2023-11-19 DIAGNOSIS — I69322 Dysarthria following cerebral infarction: Secondary | ICD-10-CM

## 2023-11-19 DIAGNOSIS — I63512 Cerebral infarction due to unspecified occlusion or stenosis of left middle cerebral artery: Secondary | ICD-10-CM | POA: Diagnosis not present

## 2023-11-19 DIAGNOSIS — I69320 Aphasia following cerebral infarction: Secondary | ICD-10-CM | POA: Diagnosis not present

## 2023-11-19 DIAGNOSIS — D75839 Thrombocytosis, unspecified: Secondary | ICD-10-CM | POA: Diagnosis present

## 2023-11-19 DIAGNOSIS — Z79899 Other long term (current) drug therapy: Secondary | ICD-10-CM | POA: Diagnosis not present

## 2023-11-19 DIAGNOSIS — I110 Hypertensive heart disease with heart failure: Secondary | ICD-10-CM | POA: Diagnosis present

## 2023-11-19 DIAGNOSIS — I2510 Atherosclerotic heart disease of native coronary artery without angina pectoris: Secondary | ICD-10-CM | POA: Diagnosis present

## 2023-11-19 DIAGNOSIS — E785 Hyperlipidemia, unspecified: Secondary | ICD-10-CM | POA: Diagnosis present

## 2023-11-19 DIAGNOSIS — N179 Acute kidney failure, unspecified: Secondary | ICD-10-CM | POA: Diagnosis present

## 2023-11-19 DIAGNOSIS — K5900 Constipation, unspecified: Secondary | ICD-10-CM | POA: Diagnosis present

## 2023-11-19 DIAGNOSIS — Z955 Presence of coronary angioplasty implant and graft: Secondary | ICD-10-CM

## 2023-11-19 DIAGNOSIS — K5901 Slow transit constipation: Secondary | ICD-10-CM | POA: Diagnosis not present

## 2023-11-19 DIAGNOSIS — Z833 Family history of diabetes mellitus: Secondary | ICD-10-CM | POA: Diagnosis not present

## 2023-11-19 DIAGNOSIS — R531 Weakness: Secondary | ICD-10-CM | POA: Diagnosis present

## 2023-11-19 LAB — GLUCOSE, CAPILLARY
Glucose-Capillary: 160 mg/dL — ABNORMAL HIGH (ref 70–99)
Glucose-Capillary: 119 mg/dL — ABNORMAL HIGH (ref 70–99)
Glucose-Capillary: 111 mg/dL — ABNORMAL HIGH (ref 70–99)
Glucose-Capillary: 104 mg/dL — ABNORMAL HIGH (ref 70–99)

## 2023-11-19 MED ORDER — ACETAMINOPHEN 325 MG PO TABS: 650.0000 mg | ORAL_TABLET | Freq: Four times a day (QID) | ORAL | Status: AC | PRN

## 2023-11-19 MED ORDER — ASPIRIN 81 MG PO TBEC: 81.0000 mg | DELAYED_RELEASE_TABLET | Freq: Every day | ORAL | Status: AC

## 2023-11-19 MED ORDER — QUETIAPINE FUMARATE 25 MG PO TABS
25.0000 mg | ORAL_TABLET | ORAL | Status: DC
  Administered 2023-11-19: 25 mg via ORAL
  Filled 2023-11-19: qty 1

## 2023-11-19 MED ORDER — LOSARTAN POTASSIUM 25 MG PO TABS: 25.0000 mg | ORAL_TABLET | Freq: Every day | ORAL | Status: DC

## 2023-11-19 MED ORDER — ONDANSETRON HCL 4 MG/2ML IJ SOLN: 4.0000 mg | Freq: Four times a day (QID) | INTRAMUSCULAR | Status: DC | PRN

## 2023-11-19 MED ORDER — VITAMIN D (ERGOCALCIFEROL) 1.25 MG (50000 UNIT) PO CAPS: 50000.0000 [IU] | ORAL_CAPSULE | ORAL | Status: DC

## 2023-11-19 MED ORDER — METOPROLOL TARTRATE 25 MG PO TABS: 25.0000 mg | ORAL_TABLET | Freq: Two times a day (BID) | ORAL | Status: DC

## 2023-11-19 MED ORDER — QUETIAPINE FUMARATE 25 MG PO TABS: 12.5000 mg | ORAL_TABLET | ORAL | Status: DC

## 2023-11-19 MED ORDER — ACETAMINOPHEN 325 MG PO TABS: 650.0000 mg | ORAL_TABLET | ORAL | Status: DC | PRN

## 2023-11-19 MED ORDER — BLOOD PRESSURE CONTROL BOOK
Freq: Once | Status: AC
  Filled 2023-11-19: qty 1

## 2023-11-19 MED ORDER — LOSARTAN POTASSIUM 25 MG PO TABS
25.0000 mg | ORAL_TABLET | Freq: Every day | ORAL | Status: DC
  Administered 2023-11-19: 25 mg via ORAL
  Filled 2023-11-19: qty 1

## 2023-11-19 MED ORDER — CLOPIDOGREL BISULFATE 75 MG PO TABS
75.0000 mg | ORAL_TABLET | Freq: Every day | ORAL | Status: DC
  Administered 2023-11-20 – 2023-12-06 (×17): 75 mg via ORAL
  Filled 2023-11-19 (×17): qty 1

## 2023-11-19 MED ORDER — ACETAMINOPHEN 650 MG RE SUPP: 650.0000 mg | RECTAL | Status: DC | PRN

## 2023-11-19 MED ORDER — METOPROLOL TARTRATE 25 MG PO TABS
25.0000 mg | ORAL_TABLET | Freq: Two times a day (BID) | ORAL | Status: DC
  Administered 2023-11-21 – 2023-12-06 (×29): 25 mg via ORAL
  Filled 2023-11-19 (×33): qty 1

## 2023-11-19 MED ORDER — ROSUVASTATIN CALCIUM 20 MG PO TABS
20.0000 mg | ORAL_TABLET | Freq: Every day | ORAL | Status: DC
  Administered 2023-11-19 – 2023-12-05 (×17): 20 mg via ORAL
  Filled 2023-11-19 (×17): qty 1

## 2023-11-19 MED ORDER — LOSARTAN POTASSIUM 25 MG PO TABS
25.0000 mg | ORAL_TABLET | Freq: Every day | ORAL | Status: DC
  Administered 2023-11-20 – 2023-12-01 (×12): 25 mg via ORAL
  Filled 2023-11-19 (×12): qty 1

## 2023-11-19 MED ORDER — ACETAMINOPHEN 160 MG/5ML PO SOLN: 650.0000 mg | ORAL | Status: DC | PRN

## 2023-11-19 MED ORDER — ENOXAPARIN SODIUM 60 MG/0.6ML IJ SOSY
0.5000 mg/kg | PREFILLED_SYRINGE | INTRAMUSCULAR | Status: DC
  Administered 2023-11-19 – 2023-11-22 (×4): 45 mg via SUBCUTANEOUS
  Filled 2023-11-19 (×4): qty 0.6

## 2023-11-19 MED ORDER — CLOPIDOGREL BISULFATE 75 MG PO TABS: 75.0000 mg | ORAL_TABLET | Freq: Every day | ORAL | Status: DC

## 2023-11-19 MED ORDER — INSULIN ASPART 100 UNIT/ML IJ SOLN
0.0000 [IU] | Freq: Three times a day (TID) | INTRAMUSCULAR | Status: DC
  Administered 2023-11-20 – 2023-11-21 (×2): 2 [IU] via SUBCUTANEOUS

## 2023-11-19 MED ORDER — ATORVASTATIN CALCIUM 80 MG PO TABS
80.0000 mg | ORAL_TABLET | Freq: Every day | ORAL | Status: DC
Start: 2023-11-19 — End: 2023-12-06

## 2023-11-19 MED ORDER — ASPIRIN 81 MG PO TBEC
81.0000 mg | DELAYED_RELEASE_TABLET | Freq: Every day | ORAL | Status: DC
  Administered 2023-11-20 – 2023-12-06 (×17): 81 mg via ORAL
  Filled 2023-11-19 (×17): qty 1

## 2023-11-19 MED ORDER — QUETIAPINE FUMARATE 25 MG PO TABS: 25.0000 mg | ORAL_TABLET | ORAL | Status: DC

## 2023-11-19 MED ORDER — SENNOSIDES-DOCUSATE SODIUM 8.6-50 MG PO TABS: 1.0000 | ORAL_TABLET | Freq: Every evening | ORAL | Status: DC | PRN

## 2023-11-19 NOTE — TOC Progression Note (Signed)
 Transition of Care [facility]) - Progression Note    Patient Details  Name: [name] [name] MRN: [id number] Date of Birth: 02-20-1941  Transition of Care [facility]) CM/SW Contact  Areta Beer, RN Phone Number: 11/19/2023, 9:36 AM  Clinical Narrative:  11/18/23: Authorization for CIR was approved per inpatient rehab coordinator with probable facility to facility transfer to [facility] CIR today.   Katheryn Pandy MSN, RN RN [employer] [facility]  [employer] [employer] Office Hours Wed/Thur  8:00 am-6:00 pm Direct Dial: [phone] Main Phone [phone]  Fax: [phone] [email or web address]           Expected Discharge Plan and Services         Expected Discharge Date: 11/19/23                                     Social Determinants of Health (SDOH) Interventions SDOH Screenings   Food Insecurity: Patient Unable To Answer (11/16/2023)  Housing: Patient Unable To Answer (11/16/2023)  Transportation Needs: Patient Unable To Answer (11/16/2023)  Utilities: Patient Unable To Answer (11/16/2023)  Depression (PHQ2-9): Low Risk  (12/16/2020)  Social Connections: Patient Unable To Answer (11/16/2023)  Tobacco Use: Medium Risk (11/16/2023)    Readmission Risk Interventions     No data to display

## 2023-11-19 NOTE — Progress Notes (Signed)
 Patient noted to have moments of apnea while sleeping during observation by RN this morning. Notified Dr. Hubert Madden.

## 2023-11-19 NOTE — Progress Notes (Signed)
 Physical Therapy Treatment Patient Details Name: [name] [name] MRN: [id number] DOB: 10/03/1940 Today's Date: 11/19/2023   History of Present Illness Pt is an 83 y.o. male who presents to the ED due to stroke symptoms. 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 Additional punctate subcentimeter acute ischemic nonhemorrhagic infarct within the contralateral right basal ganglia.  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    PT Comments  Pt in bed.  Session somewhat limited today due to fatigue and awaiting transfer to CIR.  He required mod a x 2 for most tasks today.  Pre-gait weight shifting and RLE AROM before gait trials. He does struggle a bit more today with advancing feet with decreased step length and height bilaterally.  Son stated pt has not slept well since admission and medications are being adjusted that cause lethargy.  He is limited to about 5' x 2 today and after gait is returned to bed as he is awaiting transport to CIR today.   If plan is discharge home, recommend the following: Two people to help with walking and/or transfers;Two people to help with bathing/dressing/bathroom;Assistance with cooking/housework;Assist for transportation;Help with stairs or ramp for entrance   Can travel by private vehicle        Equipment Recommendations  None recommended by PT    Recommendations for Other Services       Precautions / Restrictions Precautions Precautions: Fall Recall of Precautions/Restrictions: Impaired Precaution/Restrictions Comments: per chart, pt has been trying to get up by himself at times. Restrictions Weight Bearing Restrictions Per Provider Order: No     Mobility  Bed Mobility Overal bed mobility: Needs Assistance Bed Mobility: Supine to Sit, Sit to Supine Rolling: Mod assist, +2 for physical assistance   Supine  to sit: Mod assist, +2 for physical assistance       Patient Response: Cooperative, Flat affect  Transfers Overall transfer level: Needs assistance Equipment used: Rolling walker (2 wheels) Transfers: Sit to/from Stand Sit to Stand: +2 safety/equipment, From elevated surface, Mod assist                Ambulation/Gait Ambulation/Gait assistance: +2 safety/equipment, Mod assist Gait Distance (Feet): 5 Feet Assistive device: Rolling walker (2 wheels) Gait Pattern/deviations: Step-to pattern, Decreased step length - right, Decreased step length - left, Decreased stride length, Step-through pattern Gait velocity: decreased     General Gait Details: increased difficulty today with steps., right lean   Stairs             Wheelchair Mobility     Tilt Bed Tilt Bed Patient Response: Cooperative, Flat affect  Modified Rankin (Stroke Patients Only)       Balance   Sitting-balance support: Feet supported, Bilateral upper extremity supported Sitting balance-Leahy Scale: Poor Sitting balance - Comments: occasional min assist at times for balance, post LOB's primarily Postural control: Posterior lean Standing balance support: Bilateral upper extremity supported, During functional activity, Reliant on assistive device for balance Standing balance-Leahy Scale: Poor Standing balance comment: +2 for safety; heavy lean to R at times requiring Mod A.                            Communication Communication Communication: Impaired Factors Affecting Communication: Hearing impaired;Difficulty expressing self;Reduced clarity of speech  Cognition Arousal: Lethargic Behavior During Therapy: Flat affect  Following commands: Impaired Following commands impaired: Follows one step commands with increased time    Cueing Cueing Techniques: Verbal cues  Exercises      General Comments        Pertinent Vitals/Pain Pain  Assessment Pain Assessment: No/denies pain    Home Living                          Prior Function            PT Goals (current goals can now be found in the care plan section) Progress towards PT goals: Progressing toward goals    Frequency    7X/week      PT Plan      Co-evaluation              AM-PAC PT "6 Clicks" Mobility   Outcome Measure  Help needed turning from your back to your side while in a flat bed without using bedrails?: A Lot Help needed moving from lying on your back to sitting on the side of a flat bed without using bedrails?: A Lot Help needed moving to and from a bed to a chair (including a wheelchair)?: A Lot Help needed standing up from a chair using your arms (e.g., wheelchair or bedside chair)?: A Lot Help needed to walk in hospital room?: A Lot Help needed climbing 3-5 steps with a railing? : Total 6 Click Score: 11    End of Session Equipment Utilized During Treatment: Gait belt Activity Tolerance: Patient limited by fatigue;Patient limited by lethargy Patient left: in bed;with bed alarm set;with call bell/phone within reach;with family/visitor present Nurse Communication: Mobility status PT Visit Diagnosis: Other abnormalities of gait and mobility (R26.89);Muscle weakness (generalized) (M62.81);Difficulty in walking, not elsewhere classified (R26.2);Unsteadiness on feet (R26.81);Hemiplegia and hemiparesis Hemiplegia - Right/Left: Right Hemiplegia - dominant/non-dominant: Dominant Hemiplegia - caused by: Cerebral infarction     Time: 0932-0950 PT Time Calculation (min) (ACUTE ONLY): 18 min  Charges:    $Gait Training: 8-22 mins PT General Charges $$ ACUTE PT VISIT: 1 Visit                   Charlanne Cong, PTA 11/19/23, 10:30 AM

## 2023-11-19 NOTE — Discharge Summary (Addendum)
 [facility] Discharge Summary   Patient: [name] [name] XBJ:[id number]  PCP: Al Hover, MD  Date of admission: 11/16/2023   Date of discharge:  11/19/2023     Discharge Diagnoses:  Principal Problem:   Acute CVA (cerebrovascular accident) [facility]) Active Problems:   Type 2 diabetes mellitus (HCC)   Coronary artery disease   Ischemic cardiomyopathy   Thrombocytosis   Admitted From: Home Disposition: CIR inpatient rehab  Recommendations for Outpatient Follow-up:  F/u with PCP, need to be seen by an MD in 1-2 days, monitor BP and Titrate meds F/u Neuro in 1-2 weeks Sleep study as an out patient, check nocturnal pulse ox and use oxygen at night prn Follow up LABS/TEST:  As above   Follow-up Information     Al Hover, MD Follow up.   Specialty: Family Medicine Why: Hospital follow up Contact information: [address] [place] [address] [phone]         Al Hover, MD Follow up in 1 week(s).   Specialty: Family Medicine Contact information: [address] [place] [address] [phone]                Diet recommendation: Cardiac diet  Activity: The patient is advised to gradually reintroduce usual activities, as tolerated  Discharge Condition: stable  Code Status: Full code   History of present illness: As per the H and P dictated on admission Hospital Course:  [name] [name] is a 83 y.o. 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   History obtained predominantly from patient's family at bedside due to patient's difficulty speaking.  His son [name] states that he checked on Mr. Gelles at approximately 9-9:30 AM and he seemed at his baseline.  Then his brother went to go see him him again at  approximately 2 PM and noted that he had a severe right-sided facial droop with drooling out the side of his mouth and was unable to speak coherently.  Due to this, an ambulance was called.  [name] states that 3-4 months ago, patient had an episode of vomiting with inability to walk due to bilateral lower extremity weakness.  He slowly recovered over 3-4 weeks, but wonders if this was also a stroke.  [name] also notes that patient has not been taking any of his home medications and has not seen any physician in over 2 years.   Mr. Medlen denies any recent illness, nausea, vomiting, chest pain, shortness of breath, palpitations.   ED course: On arrival to the ED, patient was hypertensive at 176/92 with heart rate of 89.  He was saturating at 96% on room air.  He was afebrile. Initial workup notable for WBC at 12.1, platelets 689, potassium 3.2, glucose 184, creatinine 0.89 with GFR above 60.  CT head with no acute infarct.  CTA head/neck with no LVO.  Neurology consulted.  [facility] contacted for admission.     Assessment and Plan:   # Acute CVA (cerebrovascular accident) Patient is presenting with right-sided facial droop, right weakness and slurred speech with symptoms beginning sometime between 9:30 AM and 2 PM today.  No evidence of LVO on imaging and outside of the window for TNK. MRI positive for acute CVA, please review complete report. LDL 100 goal <70, TSH 0.6 at lower end,  free T41.0 Within normal range HbA1c 5.8, well-controlled. S/p permissive hypertension 48 hrs and then normotensive TTE shows LVEF 40 to 45%, grade 1 diastolic dysfunction, no any other significant findings Neurology consulted, started aspirin and Plavix, recommended DAPT for 90 days f/b Plavix 75 mg daily monotherapy.  Lipitor 80 mg p.o. daily PT and OT eval done, recommended CIR SLP eval done, started heart healthy and carb modified diet Continue aspiration precautions, fall precautions. Cardiology involved to arrange  cardiac monitor to rule out occult arrhythmias Follow-up with neurology in 1 to 2 weeks as an outpatient   # Thrombocytosis Per chart review, patient has a history of chronic thrombocytosis of unknown etiology dating back to at least 2016.  Stable at this time. Outpatient follow-up with oncology to establish etiology Continue DAPT as above   # Chronic systolic and diastolic CHF, ischemic cardiomyopathy Per chart review, patient has a history of ischemic cardiomyopathy with last ultrasound available 2019 that demonstrated mildly reduced LVEF at 45%.  No follow-up since then. 4/11 TTE shows LVEF 40 to 45%, grade 1 diastolic dysfunction, no any other significant findings. Started metoprolol 25 mg p.o. twice daily and losartan 25 mg p.o. daily.  Gradually start GDMT as per patient's improvement.  May benefit from follow-up with cardiology as an outpatient.  # Hypertension: S/p s/p permissive hypertension for 2 days, started Lopressor 25 mg p.o. twice daily with holding parameters. 4/13 started losartan 25 mg p.o. daily.  Monitor BP and titrate medications accordingly.     # Coronary artery disease Per chart review, patient has a history of CAD s/p bare-metal stent to mid LAD in 2012.  Last follow-up with cardiology was in 2019. Patient stopped taking all home medications.  Started aspirin and Plavix as above and started metoprolol and losartan.   Recommend to follow-up with cardiology in 1 to 2 weeks.   # Type 2 diabetes mellitus: Hemoglobin A1c 5.8, well-controlled, prediabetic range.  Patient is not on any medication for diabetes.  Continue diabetic diet.  # Hypokalemia, resolved. # Hypophosphatemia due to nutritional deficiency.  Phos repleted. # Acute delirium and sundowning, s/p Seroquel 25 mg p.o. after dinner one dose was given on 4/12, today patient was very sleepy so decreased dose of Seroquel 12.5 mg PO after dinner.  Watch for any signs of sundowning and delirium. # Vitamin D  Insufficiency: started vitamin D 50,000 units p.o. weekly, follow with PCP to repeat vitamin D level after 3 to 6 months.   Body mass index is 27.57 kg/m.  Nutrition Interventions:   Patient was seen by physical therapy, who recommended Therapy, CIR (acute rehab placement) which was arranged. On the day of the discharge the patient's vitals were stable, and no other acute medical condition were reported by patient. the patient was felt safe to be discharge at CIR with Therapy.  Consultants: Neurology Procedures: None  Discharge Exam: General: Appear in no distress, no Rash; Oral Mucosa Clear, moist. Cardiovascular: S1 and S2 Present, no Murmur, Respiratory: normal respiratory effort, Bilateral Air entry present and no Crackles, no wheezes Abdomen: Bowel Sound present, Soft and no tenderness, no hernia Extremities: no Pedal edema, no calf tenderness Neurology: Right-sided facial droop, dysarthria and right-sided weakness power 4/5. affect appropriate.  Filed Weights   11/17/23 0603  Weight: 92.2 kg   Vitals:   11/18/23 2120 11/19/23 0503  BP: (!) 140/84 (!) 154/92  Pulse: 75 87  Resp: 18   Temp: 98 F (36.7 C) 98.2 F (36.8 C)  SpO2: 97% 95%    DISCHARGE MEDICATION: Allergies as of 11/19/2023   No Known Allergies      Medication List     STOP taking these medications    atenolol 50 MG tablet Commonly known as: TENORMIN   colchicine 0.6 MG tablet   diclofenac Sodium 1 % Gel Commonly known as: VOLTAREN   diltiazem 120 MG 24 hr capsule Commonly known as: CARDIZEM CD   FLUoxetine 20 MG capsule Commonly known as: PROZAC   hydrochlorothiazide 12.5 MG tablet Commonly known as: HYDRODIURIL   lisinopril 10 MG tablet Commonly known as: ZESTRIL   nitroGLYCERIN 0.4 MG SL tablet Commonly known as: NITROSTAT   terbinafine 250 MG tablet Commonly known as: LAMISIL       TAKE these medications    acetaminophen 325 MG tablet Commonly known as:  TYLENOL Take 2 tablets (650 mg total) by mouth every 6 (six) hours as needed for mild pain (pain score 1-3), fever or headache (or temp > 37.5 C (99.5 F)).   aspirin EC 81 MG tablet Take 1 tablet (81 mg total) by mouth daily for 20 days. Swallow whole. What changed:  medication strength how much to take additional instructions   atorvastatin 80 MG tablet Commonly known as: Lipitor Take 1 tablet (80 mg total) by mouth daily.   clopidogrel 75 MG tablet Commonly known as: PLAVIX Take 1 tablet (75 mg total) by mouth daily.   losartan 25 MG tablet Commonly known as: COZAAR Take 1 tablet (25 mg total) by mouth daily.   metoprolol tartrate 25 MG tablet Commonly known as: LOPRESSOR Take 1 tablet (25 mg total) by mouth 2 (two) times daily.   QUEtiapine 25 MG tablet Commonly known as: SEROQUEL Take 0.5 tablets (12.5 mg total) by mouth daily after supper. Skip the dose if pt is sleepy and drowsy   Vitamin D (Ergocalciferol) 1.25 MG (50000 UNIT) Caps capsule Commonly known as: DRISDOL Take 1 capsule (50,000 Units total) by mouth every 7 (seven) days. Start taking on: November 25, 2023       No Known Allergies Discharge Instructions     Ambulatory referral to Neurology   Complete by: As directed    Call MD for:   Complete by: As directed    Worsening of weakness or numbness, any new neuro symptoms   Call MD for:  difficulty breathing, headache or visual disturbances   Complete by: As directed    Call MD for:  extreme fatigue   Complete by: As directed    Call MD for:  persistant dizziness or light-headedness   Complete by: As directed    Call MD for:  persistant nausea and vomiting   Complete by: As directed    Call MD for:  severe uncontrolled pain   Complete by: As directed    Call MD for:  temperature >100.4   Complete by: As directed    Diet - low sodium heart healthy   Complete by: As directed    Discharge instructions   Complete by: As directed    F/u with PCP,  need to be seen by an MD in 1-2 days, monitor BP and Titrate meds F/u Neuro in 1-2 weeks Sleep study as an out patient, check nocturnal pulse ox and use oxygen at night prn   Increase activity slowly   Complete by: As directed        The results of significant diagnostics from this hospitalization (including imaging, microbiology, ancillary and laboratory) are listed  below for reference.    Significant Diagnostic Studies: ECHOCARDIOGRAM COMPLETE Result Date: 11/17/2023    ECHOCARDIOGRAM REPORT   Patient Name:   IRVIN BASTIN Forker Date of Exam: 11/17/2023 Medical Rec #:  [id number]           Height:       72.0 in Accession #:    [id number]          Weight:       203.3 lb Date of Birth:  [birth year]            BSA:          2.145 m Patient Age:    83 years            BP:           152/78 mmHg Patient Gender: M                   HR:           71 bpm. Exam Location:  [facility] Procedure: 2D Echo, Cardiac Doppler and Color Doppler (Both Spectral and Color            Flow Doppler were utilized during procedure). Indications:     Stroke  History:         Patient has no prior history of Echocardiogram examinations.                  Cardiomyopathy, CAD and Previous Myocardial Infarction, Stroke,                  Arrythmias:PVC; Risk Factors:Hypertension and Diabetes.  Sonographer:     Clarke Crouch Referring Phys:  [id number] Avi Body Diagnosing Phys: Belva Boyden MD  Sonographer Comments: Technically difficult study due to poor echo windows. IMPRESSIONS  1. Left ventricular ejection fraction, by estimation, is 40 to 45%. The left ventricle has mildly decreased function. 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 There is moderate left ventricular hypertrophy. Left ventricular diastolic parameters are consistent with Grade I diastolic dysfunction  (impaired relaxation).  2. Right ventricular systolic function is normal. The right ventricular size is normal.  3. The mitral valve is normal in structure. No evidence of mitral valve regurgitation. No evidence of mitral stenosis.  4. The aortic valve is calcified. There is mild calcification of the aortic valve. Aortic valve regurgitation is not visualized. Aortic valve sclerosis/calcification is present, without any evidence of aortic stenosis.  5. The inferior vena cava is normal in size with greater than 50% respiratory variability, suggesting right atrial pressure of 3 mmHg. FINDINGS  Left Ventricle: Left ventricular ejection fraction, by estimation, is 40 to 45%. The left ventricle has mildly decreased function. The left ventricle demonstrates regional wall motion abnormalities. Strain was performed and the global longitudinal strain is indeterminate. The left ventricular internal cavity size was normal in size. There is moderate left ventricular hypertrophy. Left ventricular diastolic parameters are consistent with Grade I diastolic dysfunction (impaired relaxation). Right Ventricle: The right ventricular size is normal. No increase in right ventricular wall thickness. Right ventricular systolic function is normal. Left Atrium: Left atrial size was normal in size. Right Atrium: Right atrial size was normal in size. Pericardium: There is no evidence of pericardial effusion. Mitral Valve: The mitral valve is normal in structure. No evidence of mitral valve regurgitation. No evidence of mitral valve stenosis. MV peak gradient,  3.3 mmHg. The mean mitral valve gradient is 1.0 mmHg. Tricuspid Valve: The tricuspid valve is normal in structure. Tricuspid valve regurgitation is mild . No evidence of tricuspid stenosis. Aortic Valve: The aortic valve is calcified. There is mild calcification of the aortic valve. Aortic valve regurgitation is not visualized. Aortic valve sclerosis/calcification is present, without any  evidence of aortic stenosis. Aortic valve mean gradient measures 3.0 mmHg. Aortic valve peak gradient measures 6.7 mmHg. Aortic valve area, by VTI measures 2.44 cm. Pulmonic Valve: The pulmonic valve was normal in structure. Pulmonic valve regurgitation is not visualized. No evidence of pulmonic stenosis. Aorta: The aortic root is normal in size and structure. Venous: The inferior vena cava is normal in size with greater than 50% respiratory variability, suggesting right atrial pressure of 3 mmHg. IAS/Shunts: No atrial level shunt detected by color flow Doppler. Additional Comments: 3D was performed not requiring image post processing on an independent workstation and was indeterminate.  LEFT VENTRICLE PLAX 2D LVIDd:         4.60 cm   Diastology LVIDs:         3.00 cm   LV e' medial:    5.29 cm/s LV PW:         1.40 cm   LV E/e' medial:  11.6 LV IVS:        1.60 cm   LV e' lateral:   6.46 cm/s LVOT diam:     2.00 cm   LV E/e' lateral: 9.5 LV SV:         63 LV SV Index:   29 LVOT Area:     3.14 cm  RIGHT VENTRICLE RV Basal diam:  4.20 cm RV Mid diam:    3.20 cm RV S prime:     16.90 cm/s LEFT ATRIUM           Index        RIGHT ATRIUM           Index LA diam:      3.90 cm 1.82 cm/m   RA Area:     18.90 cm LA Vol (A2C): 51.4 ml 23.96 ml/m  RA Volume:   55.50 ml  25.87 ml/m LA Vol (A4C): 41.0 ml 19.11 ml/m  AORTIC VALVE                    PULMONIC VALVE AV Area (Vmax):    2.39 cm     PV Vmax:       1.56 m/s AV Area (Vmean):   2.37 cm     PV Peak grad:  9.7 mmHg AV Area (VTI):     2.44 cm AV Vmax:           129.00 cm/s AV Vmean:          83.700 cm/s AV VTI:            0.259 m AV Peak Grad:      6.7 mmHg AV Mean Grad:      3.0 mmHg LVOT Vmax:         98.20 cm/s LVOT Vmean:        63.100 cm/s LVOT VTI:          0.201 m LVOT/AV VTI ratio: 0.78  AORTA Ao Root diam: 3.80 cm Ao Asc diam:  3.30 cm MITRAL VALVE               TRICUSPID VALVE MV Area (PHT): 3.36 cm    TR  Peak grad:   13.0 mmHg MV Area VTI:   2.43 cm     TR Vmax:        180.00 cm/s MV Peak grad:  3.3 mmHg MV Mean grad:  1.0 mmHg    SHUNTS MV Vmax:       0.91 m/s    Systemic VTI:  0.20 m MV Vmean:      52.5 cm/s   Systemic Diam: 2.00 cm MV Decel Time: 226 msec MV E velocity: 61.30 cm/s MV A velocity: 83.80 cm/s MV E/A ratio:  0.73 Belva Boyden MD Electronically signed by Belva Boyden MD Signature Date/Time: 11/17/2023/12:55:51 PM    Final    MR BRAIN WO CONTRAST Result Date: 11/16/2023 CLINICAL DATA:  Initial evaluation for acute neuro deficit, stroke suspected. EXAM: MRI HEAD WITHOUT CONTRAST TECHNIQUE: Multiplanar, multiecho pulse sequences of the brain and surrounding structures were obtained without intravenous contrast. COMPARISON:  CTs from earlier the same day. FINDINGS: Brain: Cerebral volume within normal limits. Patchy T2/FLAIR hyperintensity involving the periventricular and deep white matter both cerebral hemispheres, consistent with chronic small vessel ischemic disease, moderately advanced in nature. Few scatter remote lacunar infarcts present about the right frontal centrum semi ovale, basal ganglia, and thalami. Patchy restricted diffusion seen involving the cortical to subcortical aspect of the left frontal lobe, consistent with a small left MCA distribution infarct (series 5, image 45). Additional punctate subcentimeter acute ischemic infarct noted within the contralateral right basal ganglia (series 5, image 34). No associated hemorrhage or mass effect. No other evidence for acute or subacute ischemia. Gray-white matter differentiation otherwise maintained. No acute intracranial hemorrhage. Few small foci of susceptibility artifact noted at the level of the left sylvian fissure, suspected to reflect thrombus within distal left MCA branches related to the overlying left MCA distribution infarct (series 12, image 43). No mass lesion, midline shift or mass effect. No hydrocephalus or extra-axial fluid collection. Pituitary gland within normal  limits. Vascular: Loss of normal flow void within the left vertebral artery, consistent with previously identified chronic occlusion. Skull and upper cervical spine: Craniocervical junction within normal limits. Bone marrow signal intensity normal. No scalp soft tissue abnormality. Sinuses/Orbits: Globes orbital soft tissues within normal limits. Changes of chronic right maxillary sinus disease noted. Paranasal sinuses are otherwise largely clear. Trace left mastoid effusion noted, of doubtful significance. Other: None. IMPRESSION: 1. Small acute nonhemorrhagic left MCA distribution infarct involving the left frontal lobe. No associated mass effect. 2. Additional punctate subcentimeter acute ischemic nonhemorrhagic infarct within the contralateral right basal ganglia. 3. Underlying moderately advanced chronic microvascular ischemic disease with a few scattered remote lacunar infarcts as above. 4. Chronic occlusion of the left vertebral artery. Electronically Signed   By: Virgia Griffins M.D.   On: 11/16/2023 23:48   CT ANGIO HEAD NECK W WO CM (CODE STROKE) Result Date: 11/16/2023 CLINICAL DATA:  Code stroke, neuro deficit, right-sided facial droop, mild aphasia, left gaze preference. EXAM: CT ANGIOGRAPHY HEAD AND NECK WITH AND WITHOUT CONTRAST TECHNIQUE: Multidetector CT imaging of the head and neck was performed using the standard protocol during bolus administration of intravenous contrast. Multiplanar CT image reconstructions and MIPs were obtained to evaluate the vascular anatomy. Carotid stenosis measurements (when applicable) are obtained utilizing NASCET criteria, using the distal internal carotid diameter as the denominator. RADIATION DOSE REDUCTION: This exam was performed according to the departmental dose-optimization program which includes automated exposure control, adjustment of the mA and/or kV according to patient size and/or use of iterative reconstruction  technique. CONTRAST:  75mL  OMNIPAQUE IOHEXOL 350 MG/ML SOLN COMPARISON:  Same-day head CT. FINDINGS: CTA NECK FINDINGS Aortic arch: Standard configuration of the aortic arch. Imaged portion shows no evidence of aneurysm or dissection. No significant stenosis of the major arch vessel origins. Pulmonary arteries: As permitted by contrast timing, there are no filling defects in the visualized pulmonary arteries. Subclavian arteries: The subclavian arteries are patent bilaterally. Right carotid system: Patent. Mild atherosclerosis at the carotid bifurcation without hemodynamically significant stenosis. No evidence of dissection. Tortuosity of the mid cervical ICA. Left carotid system: Patent. Mild atherosclerosis at the carotid bifurcation without hemodynamically significant stenosis. No evidence of dissection. Vertebral arteries: Right vertebral artery is dominant. The right vertebral artery is patent from the origin to the vertebrobasilar confluence. Atherosclerosis of the right V4 segment resulting in mild-to-moderate stenosis. There is atherosclerosis at the origin of the non dominant left vertebral artery with limited evaluation of stenosis which is at least moderate in severity. The left vertebral artery is patent to the proximal V3 segment. The vertebral artery is occluded from this level to the vertebrobasilar confluence. Prominent focal atherosclerosis along the distal left V4 segment. Skeleton: No acute findings. Degenerative changes in the cervical spine. Intervertebral disc space narrowing greatest at C5-6 through C7-T1. Edentulous maxilla and mandible. Mucosal thickening in the right maxillary sinus with findings suggestive of mucoperiosteal reaction. Other neck: The visualized airway is patent. No cervical lymphadenopathy. Subcentimeter thyroid nodules. Upper chest: Centrilobular emphysema in the visualized upper lobes with additional paraseptal emphysema in the lung apices most pronounced on the right. Review of the MIP images  confirms the above findings CTA HEAD FINDINGS ANTERIOR CIRCULATION: The intracranial ICAs are patent bilaterally. Atherosclerosis of the bilateral carotid siphons. There is mild stenosis of the right supraclinoid ICA. No high-grade stenosis, proximal occlusion, aneurysm, or vascular malformation. MCAs: The right M1 segment is patent. There is severe stenosis at the origin of a M2 superior division branch. Short-segment occlusion of an M2 inferior division branch with reconstitution within the sylvian fissure. The left M1 segment is patent. There is mild narrowing of an M2 inferior division branch. Severe stenosis and subtotal occlusion of an M2 inferior division branch of the left MCA with additional multifocal severe stenosis of M3 and M4 branches within this distribution. ACAs: The A1 and A2 segments are patent bilaterally. Moderate stenosis of the bilateral A3 segments. Additional focal severe stenosis of the left A4 segment. POSTERIOR CIRCULATION: PCAs: Patent bilaterally. Focal severe stenosis of the proximal P3 segment right PCA. Additional severe stenosis of the posterior P2 segment and proximal P3 segment of the left PCA. Pcomm: Visualized on the right. SCAs: The superior cerebellar arteries are patent bilaterally. Basilar artery: Patent AICAs: Patent PICAs: Visualized on the right. Vertebral arteries: Patent on the right. Venous sinuses: As permitted by contrast timing, patent. Anatomic variants: None Review of the MIP images confirms the above findings IMPRESSION: No large vessel occlusion or evidence of findings amenable to neurovascular intervention. 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 Multiple intracranial vascular stenoses as above. Focal short segment occlusion of an M2 inferior division branch of the right MCA with reconstitution noted. Mild-to-moderate stenosis of the V4 segment right  vertebral artery. Emphysema (ICD10-J43.9). Electronically Signed   By: Denny Flack M.D.   On: 11/16/2023 15:50   CT HEAD CODE STROKE WO CONTRAST Result Date: 11/16/2023 CLINICAL DATA:  Code stroke.  Right facial droop and slurred speech. Last known well at 9:15 a.m. EXAM: CT HEAD WITHOUT CONTRAST TECHNIQUE: Contiguous axial images were obtained from the base of the skull through the vertex without intravenous contrast. RADIATION DOSE REDUCTION: This exam was performed according to the departmental dose-optimization program which includes automated exposure control, adjustment of the mA and/or kV according to patient size and/or use of iterative reconstruction technique. COMPARISON:  None Available. FINDINGS: Brain: Moderate atrophy and white matter changes are present bilaterally. Age indeterminate infarcts are present in the anterior thalami bilaterally, more prominent right than left. Subtle hypoattenuation is present at the anterior limb of the right internal capsule. A subcortical white matter infarct is present in the anterior right frontal lobe superior to the frontal horn of the right lateral ventricle. An age indeterminate infarct is present in the left lentiform nucleus. No other acute cortical infarcts are present. No acute hemorrhage or mass lesion is present. No significant extra-axial fluid collections are present. The ventricles are proportionate to the degree of atrophy. The brainstem and cerebellum are within normal limits. Midline structures are within normal limits. Vascular: Atherosclerotic calcifications are present at the cavernous internal carotid arteries and at the dural margin of both vertebral arteries. No hyperdense vessel is present. Skull: Calvarium is intact. No focal lytic or blastic lesions are present. No significant extracranial soft tissue lesion is present. Sinuses/Orbits: The paranasal sinuses and mastoid air cells are clear. The globes and orbits are within normal limits.  ASPECTS [facility] Stroke Program Early CT Score) - Ganglionic level infarction (caudate, lentiform nuclei, internal capsule, insula, M1-M3 cortex): 6/7 - Supraganglionic infarction (M4-M6 cortex): 3/3 Total score (0-10 with 10 being normal): 9/10 IMPRESSION: 1. 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 2. Subtle hypoattenuation at the anterior limb of the right internal capsule. 3. 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 4. Age indeterminate infarct in the left lentiform nucleus. 5. Moderate atrophy and white matter disease likely reflects the sequela of chronic microvascular ischemia. 6. No acute hemorrhage or mass lesion. These results were called by telephone at the time of interpretation on 11/16/2023 at 3:13 pm to provider Dr. Doretta Gant, who verbally acknowledged these results. Electronically Signed   By: Audree Leas M.D.   On: 11/16/2023 15:14    Microbiology: No results found for this or any previous visit (from the past 240 hours).   Labs: CBC: Recent Labs  Lab 11/16/23 1500 11/17/23 0511 11/18/23 0531  WBC 12.1* 8.6 7.0  NEUTROABS 10.3* 7.5  --   HGB 15.6 14.4 16.1  HCT 46.1 41.6 46.6  MCV 87.5 86.5 85.3  PLT 689* 498* 527*   Basic Metabolic Panel: Recent Labs  Lab 11/16/23 1500 11/17/23 0511 11/18/23 0531  NA 138 139 139  K 3.2* 3.6 3.5  CL 104 103 104  CO2 21* 28 26  GLUCOSE 184* 134* 123*  BUN 15 16 16   CREATININE 0.89 0.98 0.85  CALCIUM 8.2* 8.6* 8.8*  MG  --  2.1 2.1  PHOS  --  2.7 2.2*   Liver Function Tests: Recent Labs  Lab 11/16/23 1500  AST 20  ALT 13  ALKPHOS 85  BILITOT 1.2  PROT 6.5  ALBUMIN 4.0   No results for input(s): "LIPASE", "AMYLASE" in the last 168 hours. No results for input(s): "AMMONIA" in the last 168 hours. Cardiac Enzymes: No results for input(s): "CKTOTAL", "CKMB", "  CKMBINDEX",  "TROPONINI" in the last 168 hours. BNP (last 3 results) No results for input(s): "BNP" in the last 8760 hours. CBG: Recent Labs  Lab 11/17/23 1545 11/17/23 2140 11/18/23 0011 11/18/23 0736 11/18/23 1135  GLUCAP 119* 118* 121* 140* 118*    Time spent: 35 minutes  Signed:  Althia Atlas  [facility]  11/19/2023 9:22 AM

## 2023-11-19 NOTE — Progress Notes (Signed)
 Inpatient Rehab Admissions Coordinator:    Pt. To transfer to CIR today. RN may call report to [phone].  Pt. To admit to CIR for an estimated 12-14 days with the goal of reaching supervision to min A and returning home with his son.   Wandalee Gust, MS, CCC-SLP Rehab Admissions Coordinator  [phone] (celll) [phone] (office)

## 2023-11-19 NOTE — Plan of Care (Signed)
 Problem: Education: Goal: Knowledge of General Education information will improve Description: Including pain rating scale, medication(s)/side effects and non-pharmacologic comfort measures Outcome: Progressing   Problem: Health Behavior/Discharge Planning: Goal: Ability to manage health-related needs will improve Outcome: Progressing   Problem: Clinical Measurements: Goal: Ability to maintain clinical measurements within normal limits will improve Outcome: Progressing Goal: Will remain free from infection Outcome: Progressing Goal: Diagnostic test results will improve Outcome: Progressing Goal: Respiratory complications will improve Outcome: Progressing Goal: Cardiovascular complication will be avoided Outcome: Progressing   Problem: Activity: Goal: Risk for activity intolerance will decrease Outcome: Progressing   Problem: Nutrition: Goal: Adequate nutrition will be maintained Outcome: Progressing   Problem: Coping: Goal: Level of anxiety will decrease Outcome: Progressing   Problem: Elimination: Goal: Will not experience complications related to bowel motility Outcome: Progressing Goal: Will not experience complications related to urinary retention Outcome: Progressing   Problem: Pain Managment: Goal: General experience of comfort will improve and/or be controlled Outcome: Progressing   Problem: Safety: Goal: Ability to remain free from injury will improve Outcome: Progressing   Problem: Skin Integrity: Goal: Risk for impaired skin integrity will decrease Outcome: Progressing   Problem: Education: Goal: Knowledge of disease or condition will improve Outcome: Progressing Goal: Knowledge of secondary prevention will improve (MUST DOCUMENT ALL) Outcome: Progressing Goal: Knowledge of patient specific risk factors will improve (DELETE if not current risk factor) Outcome: Progressing   Problem: Ischemic Stroke/TIA Tissue Perfusion: Goal: Complications of  ischemic stroke/TIA will be minimized Outcome: Progressing   Problem: Coping: Goal: Will verbalize positive feelings about self Outcome: Progressing Goal: Will identify appropriate support needs Outcome: Progressing   Problem: Health Behavior/Discharge Planning: Goal: Ability to manage health-related needs will improve Outcome: Progressing Goal: Goals will be collaboratively established with patient/family Outcome: Progressing   Problem: Self-Care: Goal: Ability to participate in self-care as condition permits will improve Outcome: Progressing Goal: Verbalization of feelings and concerns over difficulty with self-care will improve Outcome: Progressing Goal: Ability to communicate needs accurately will improve Outcome: Progressing   Problem: Nutrition: Goal: Risk of aspiration will decrease Outcome: Progressing Goal: Dietary intake will improve Outcome: Progressing   Problem: Education: Goal: Ability to describe self-care measures that may prevent or decrease complications (Diabetes Survival Skills Education) will improve Outcome: Progressing Goal: Individualized Educational Video(s) Outcome: Progressing   Problem: Coping: Goal: Ability to adjust to condition or change in health will improve Outcome: Progressing   Problem: Fluid Volume: Goal: Ability to maintain a balanced intake and output will improve Outcome: Progressing   Problem: Health Behavior/Discharge Planning: Goal: Ability to identify and utilize available resources and services will improve Outcome: Progressing Goal: Ability to manage health-related needs will improve Outcome: Progressing   Problem: Metabolic: Goal: Ability to maintain appropriate glucose levels will improve Outcome: Progressing   Problem: Education: Goal: Knowledge of disease or condition will improve Outcome: Progressing Goal: Knowledge of secondary prevention will improve (MUST DOCUMENT ALL) Outcome: Progressing Goal: Knowledge  of patient specific risk factors will improve (DELETE if not current risk factor) Outcome: Progressing   Problem: Ischemic Stroke/TIA Tissue Perfusion: Goal: Complications of ischemic stroke/TIA will be minimized Outcome: Progressing   Problem: Coping: Goal: Will verbalize positive feelings about self Outcome: Progressing Goal: Will identify appropriate support needs Outcome: Progressing   Problem: Health Behavior/Discharge Planning: Goal: Ability to manage health-related needs will improve Outcome: Progressing Goal: Goals will be collaboratively established with patient/family Outcome: Progressing   Problem: Self-Care: Goal: Ability to participate in self-care as condition permits will improve  Outcome: Progressing Goal: Verbalization of feelings and concerns over difficulty with self-care will improve Outcome: Progressing Goal: Ability to communicate needs accurately will improve Outcome: Progressing   Problem: Nutrition: Goal: Risk of aspiration will decrease Outcome: Progressing Goal: Dietary intake will improve Outcome: Progressing

## 2023-11-19 NOTE — H&P (Addendum)
 Physical Medicine and Rehabilitation Admission H&P    CC: L MCA CVA  HPI: [name] [name] is a 83 y.o. male who presented to the ED with stroke symptoms. 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 Additional punctate subcentimeter acute ischemic nonhemorrhagic infarct was also present within the contralateral right basal ganglia. 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 He is being admitted to CIR today with right sided weakness, dysarthria, and cognitive impairment.   ROS +right sided weakness Past Medical History:  Diagnosis Date   Arthritis    Depression    Diabetes mellitus without complication (HCC)    Gout    Hyperlipidemia    Hypertension    Ischemic cardiomyopathy    No past surgical history on file. Family History  Problem Relation Age of Onset   Diabetes Sister    Social History:  reports that he quit smoking about 34 years ago. His smoking use included cigarettes. He has never used smokeless tobacco. He reports current alcohol use of about 3.0 standard drinks of alcohol per week. He reports that he does not use drugs. Allergies: No Known Allergies Medications Prior to Admission  Medication Sig Dispense Refill   acetaminophen (TYLENOL) 325 MG tablet Take 2 tablets (650 mg total) by mouth every 6 (six) hours as needed for mild pain (pain score 1-3), fever or headache (or temp > 37.5 C (99.5 F)).     aspirin EC 81 MG tablet Take 1 tablet (81 mg total) by mouth daily for 20 days. Swallow whole.     atorvastatin (LIPITOR) 80 MG tablet Take 1 tablet (80 mg total) by mouth daily.     clopidogrel (PLAVIX) 75 MG tablet Take 1 tablet (75 mg total) by mouth daily.     losartan (COZAAR) 25 MG tablet Take 1 tablet (25 mg total) by mouth daily.     metoprolol tartrate (LOPRESSOR) 25 MG tablet Take 1 tablet (25 mg total) by mouth 2  (two) times daily.     QUEtiapine (SEROQUEL) 25 MG tablet Take 0.5 tablets (12.5 mg total) by mouth daily after supper. Skip the dose if pt is sleepy and drowsy     [START ON 11/25/2023] Vitamin D, Ergocalciferol, (DRISDOL) 1.25 MG (50000 UNIT) CAPS capsule Take 1 capsule (50,000 Units total) by mouth every 7 (seven) days.     Home: Home Living Family/patient expects to be discharged to:: Private residence Living Arrangements: Other relatives (lives with brother at baseline, son reports brother is not in great health either) Available Help at Discharge: Family, Available 24 hours/day Type of Home: House Home Access: Stairs to enter [employer] of Steps: back entrance 5 STE; 3 STE front, if goes home with son 1 small step to enter Entrance Stairs-Rails: Left, Right, Can reach both Home Layout: One level Bathroom Shower/Tub: Engineer, manufacturing systems: Standard Bathroom Accessibility: Yes Home Equipment: Information systems manager, Rollator (4 wheels)  Lives With: Alone   Functional History: Prior Function Prior Level of Function : Independent/Modified Independent, Driving Mobility Comments: MOD I with rollator, pushes grocery cart through store; move in with son for a bit then went back home with brother, but likely to move back in with son. Limited driving. ADLs Comments: MOD I/IND   Functional Status:  Mobility: Bed Mobility Overal bed mobility: Needs Assistance Bed Mobility: Supine to Sit, Sit to Supine Rolling: Mod  assist, +2 for physical assistance Supine to sit: Mod assist, +2 for physical assistance General bed mobility comments: Max cues for sequencing and body mechanics to sit up and scoot to the EOB. Transfers Overall transfer level: Needs assistance Equipment used: Rolling walker (2 wheels) Transfers: Sit to/from Stand Sit to Stand: +2 safety/equipment, From elevated surface, Mod assist Bed to/from chair/wheelchair/BSC transfer type:: Step pivot Step pivot transfers:  Mod assist, +2 physical assistance, +2 safety/equipment General transfer comment: increased cueing, time for processing/weight shifting anterior, leans to the R side; RW management needed Ambulation/Gait Ambulation/Gait assistance: +2 safety/equipment, Mod assist Gait Distance (Feet): 5 Feet Assistive device: Rolling walker (2 wheels) Gait Pattern/deviations: Step-to pattern, Decreased step length - right, Decreased step length - left, Decreased stride length, Step-through pattern General Gait Details: increased difficulty today with steps., right lean Gait velocity: decreased   ADL: ADL Overall ADL's : Needs assistance/impaired Eating/Feeding: Supervision/ safety, Set up, Sitting Eating/Feeding Details (indicate cue type and reason): in recliner, placed splenda in coffee, took lids off items, raised table height d/t pt's height and weakness in RUE making self feeding more difficult Lower Body Dressing: Maximal assistance, Sitting/lateral leans Lower Body Dressing Details (indicate cue type and reason): to don socks Toilet Transfer: Minimal assistance, Moderate assistance, +2 for safety/equipment, +2 for physical assistance Toilet Transfer Details (indicate cue type and reason): simulated to recliner; max verb cueing and RW management for turn to recliner Functional mobility during ADLs: Minimal assistance, Rolling walker (2 wheels), +2 for safety/equipment   Cognition: Cognition Overall Cognitive Status: Impaired/Different from baseline Arousal/Alertness: Awake/alert Orientation Level: Oriented to person Cognition Arousal: Lethargic Behavior During Therapy: Flat affect Overall Cognitive Status: Impaired/Different from baseline  Physical Exam: There were no vitals taken for this visit. Physical Exam Gen: no distress, normal appearing HEENT: oral mucosa pink and moist, NCAT Cardio: Reg rate Chest: normal effort, normal rate of breathing Abd: soft, non-distended Ext: no  edema Psych: pleasant, normal affect Skin: intact Neuro: Alert, slow processing Musculoskeletal: 3/5 strength on right side except for 0/5 R plantar and dorsiflexion  Results for orders placed or performed during the hospital encounter of 11/16/23 (from the past 48 hours)  Glucose, capillary     Status: Abnormal   Collection Time: 11/17/23 12:09 PM  Result Value Ref Range   Glucose-Capillary 109 (H) 70 - 99 mg/dL    Comment: Glucose reference range applies only to samples taken after fasting for at least 8 hours.  Glucose, capillary     Status: Abnormal   Collection Time: 11/17/23  3:45 PM  Result Value Ref Range   Glucose-Capillary 119 (H) 70 - 99 mg/dL    Comment: Glucose reference range applies only to samples taken after fasting for at least 8 hours.  Glucose, capillary     Status: Abnormal   Collection Time: 11/17/23  9:40 PM  Result Value Ref Range   Glucose-Capillary 118 (H) 70 - 99 mg/dL    Comment: Glucose reference range applies only to samples taken after fasting for at least 8 hours.  Glucose, capillary     Status: Abnormal   Collection Time: 11/18/23 12:11 AM  Result Value Ref Range   Glucose-Capillary 121 (H) 70 - 99 mg/dL    Comment: Glucose reference range applies only to samples taken after fasting for at least 8 hours.  CBC     Status: Abnormal   Collection Time: 11/18/23  5:31 AM  Result Value Ref Range   WBC 7.0 4.0 - 10.5 K/uL  RBC 5.46 4.22 - 5.81 MIL/uL   Hemoglobin 16.1 13.0 - 17.0 g/dL   HCT 16.1 09.6 - 04.5 %   MCV 85.3 80.0 - 100.0 fL   MCH 29.5 26.0 - 34.0 pg   MCHC 34.5 30.0 - 36.0 g/dL   RDW 40.9 81.1 - 91.4 %   Platelets 527 (H) 150 - 400 K/uL   nRBC 0.0 0.0 - 0.2 %    Comment: Performed at [facility], [address] [place], [place] [address]  Basic metabolic panel with GFR     Status: Abnormal   Collection Time: 11/18/23  5:31 AM  Result Value Ref Range   Sodium 139 135 - 145 mmol/L   Potassium 3.5 3.5 - 5.1 mmol/L    Chloride 104 98 - 111 mmol/L   CO2 26 22 - 32 mmol/L   Glucose, Bld 123 (H) 70 - 99 mg/dL    Comment: Glucose reference range applies only to samples taken after fasting for at least 8 hours.   BUN 16 8 - 23 mg/dL   Creatinine, Ser 6.21 0.61 - 1.24 mg/dL   Calcium 8.8 (L) 8.9 - 10.3 mg/dL   GFR, Estimated >30 >86 mL/min    Comment: (NOTE) Calculated using the CKD-EPI Creatinine Equation (2021)    Anion gap 9 5 - 15    Comment: Performed at [facility], [address] [place], [place] [address]  Phosphorus     Status: Abnormal   Collection Time: 11/18/23  5:31 AM  Result Value Ref Range   Phosphorus 2.2 (L) 2.5 - 4.6 mg/dL    Comment: Performed at [facility], [address] [place], [place] [address]  Magnesium     Status: None   Collection Time: 11/18/23  5:31 AM  Result Value Ref Range   Magnesium 2.1 1.7 - 2.4 mg/dL    Comment: Performed at [facility], [address] [place], [place] [address]  Glucose, capillary     Status: Abnormal   Collection Time: 11/18/23  7:36 AM  Result Value Ref Range   Glucose-Capillary 140 (H) 70 - 99 mg/dL    Comment: Glucose reference range applies only to samples taken after fasting for at least 8 hours.  Glucose, capillary     Status: Abnormal   Collection Time: 11/18/23 11:35 AM  Result Value Ref Range   Glucose-Capillary 118 (H) 70 - 99 mg/dL    Comment: Glucose reference range applies only to samples taken after fasting for at least 8 hours.  Glucose, capillary     Status: Abnormal   Collection Time: 11/19/23  9:26 AM  Result Value Ref Range   Glucose-Capillary 160 (H) 70 - 99 mg/dL    Comment: Glucose reference range applies only to samples taken after fasting for at least 8 hours.   ECHOCARDIOGRAM COMPLETE Result Date: 11/17/2023    ECHOCARDIOGRAM REPORT   Patient Name:   [name] [name] Date of Exam: 11/17/2023 Medical Rec #:  [id number]           Height:       72.0 in Accession #:    [id number]           Weight:       203.3 lb Date of Birth:  1941/01/26            BSA:          2.145 m Patient Age:    36 years  BP:           152/78 mmHg Patient Gender: M                   HR:           71 bpm. Exam Location:  [facility] Procedure: 2D Echo, Cardiac Doppler and Color Doppler (Both Spectral and Color            Flow Doppler were utilized during procedure). Indications:     Stroke  History:         Patient has no prior history of Echocardiogram examinations.                  Cardiomyopathy, CAD and Previous Myocardial Infarction, Stroke,                  Arrythmias:PVC; Risk Factors:Hypertension and Diabetes.  Sonographer:     Clarke Crouch Referring Phys:  [id number] Avi Body Diagnosing Phys: Belva Boyden MD  Sonographer Comments: Technically difficult study due to poor echo windows. IMPRESSIONS  1. Left ventricular ejection fraction, by estimation, is 40 to 45%. The left ventricle has mildly decreased function. 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 There is moderate left ventricular hypertrophy. Left ventricular diastolic parameters are consistent with Grade I diastolic dysfunction (impaired relaxation).  2. Right ventricular systolic function is normal. The right ventricular size is normal.  3. The mitral valve is normal in structure. No evidence of mitral valve regurgitation. No evidence of mitral stenosis.  4. The aortic valve is calcified. There is mild calcification of the aortic valve. Aortic valve regurgitation is not visualized. Aortic valve sclerosis/calcification is present, without any evidence of aortic stenosis.  5. The inferior vena cava is normal in size with greater than 50% respiratory variability, suggesting right atrial pressure of 3 mmHg. FINDINGS  Left Ventricle: Left ventricular ejection fraction, by estimation,  is 40 to 45%. The left ventricle has mildly decreased function. The left ventricle demonstrates regional wall motion abnormalities. Strain was performed and the global longitudinal strain is indeterminate. The left ventricular internal cavity size was normal in size. There is moderate left ventricular hypertrophy. Left ventricular diastolic parameters are consistent with Grade I diastolic dysfunction (impaired relaxation). Right Ventricle: The right ventricular size is normal. No increase in right ventricular wall thickness. Right ventricular systolic function is normal. Left Atrium: Left atrial size was normal in size. Right Atrium: Right atrial size was normal in size. Pericardium: There is no evidence of pericardial effusion. Mitral Valve: The mitral valve is normal in structure. No evidence of mitral valve regurgitation. No evidence of mitral valve stenosis. MV peak gradient, 3.3 mmHg. The mean mitral valve gradient is 1.0 mmHg. Tricuspid Valve: The tricuspid valve is normal in structure. Tricuspid valve regurgitation is mild . No evidence of tricuspid stenosis. Aortic Valve: The aortic valve is calcified. There is mild calcification of the aortic valve. Aortic valve regurgitation is not visualized. Aortic valve sclerosis/calcification is present, without any evidence of aortic stenosis. Aortic valve mean gradient measures 3.0 mmHg. Aortic valve peak gradient measures 6.7 mmHg. Aortic valve area, by VTI measures 2.44 cm. Pulmonic Valve: The pulmonic valve was normal in structure. Pulmonic valve regurgitation is not visualized. No evidence of pulmonic stenosis. Aorta: The aortic root is normal in size and structure. Venous: The inferior vena cava is normal in size with greater than  50% respiratory variability, suggesting right atrial pressure of 3 mmHg. IAS/Shunts: No atrial level shunt detected by color flow Doppler. Additional Comments: 3D was performed not requiring image post processing on an independent  workstation and was indeterminate.  LEFT VENTRICLE PLAX 2D LVIDd:         4.60 cm   Diastology LVIDs:         3.00 cm   LV e' medial:    5.29 cm/s LV PW:         1.40 cm   LV E/e' medial:  11.6 LV IVS:        1.60 cm   LV e' lateral:   6.46 cm/s LVOT diam:     2.00 cm   LV E/e' lateral: 9.5 LV SV:         63 LV SV Index:   29 LVOT Area:     3.14 cm  RIGHT VENTRICLE RV Basal diam:  4.20 cm RV Mid diam:    3.20 cm RV S prime:     16.90 cm/s LEFT ATRIUM           Index        RIGHT ATRIUM           Index LA diam:      3.90 cm 1.82 cm/m   RA Area:     18.90 cm LA Vol (A2C): 51.4 ml 23.96 ml/m  RA Volume:   55.50 ml  25.87 ml/m LA Vol (A4C): 41.0 ml 19.11 ml/m  AORTIC VALVE                    PULMONIC VALVE AV Area (Vmax):    2.39 cm     PV Vmax:       1.56 m/s AV Area (Vmean):   2.37 cm     PV Peak grad:  9.7 mmHg AV Area (VTI):     2.44 cm AV Vmax:           129.00 cm/s AV Vmean:          83.700 cm/s AV VTI:            0.259 m AV Peak Grad:      6.7 mmHg AV Mean Grad:      3.0 mmHg LVOT Vmax:         98.20 cm/s LVOT Vmean:        63.100 cm/s LVOT VTI:          0.201 m LVOT/AV VTI ratio: 0.78  AORTA Ao Root diam: 3.80 cm Ao Asc diam:  3.30 cm MITRAL VALVE               TRICUSPID VALVE MV Area (PHT): 3.36 cm    TR Peak grad:   13.0 mmHg MV Area VTI:   2.43 cm    TR Vmax:        180.00 cm/s MV Peak grad:  3.3 mmHg MV Mean grad:  1.0 mmHg    SHUNTS MV Vmax:       0.91 m/s    Systemic VTI:  0.20 m MV Vmean:      52.5 cm/s   Systemic Diam: 2.00 cm MV Decel Time: 226 msec MV E velocity: 61.30 cm/s MV A velocity: 83.80 cm/s MV E/A ratio:  0.73 Belva Boyden MD Electronically signed by Belva Boyden MD Signature Date/Time: 11/17/2023/12:55:51 PM    Final       There were no vitals taken for this  visit.  Medical Problem List and Plan: 1. 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  Antithrombotics: -DVT/anticoagulation:  Pharmaceutical:  Lovenox  -antiplatelet therapy: aspirin, plavix, recommended DAPT for 90 days followed by Plavix 75mg  daily monotherapy 3. Thrombocytosis: continue DAPT 4. Ischemic cardiomyopathy: continue lopressor 5. Impaired cognition: This patient is not capable of making decisions on his own behalf. 6. HTN: continue lopressor 7.CAD s/p stent to LAD in 2012: continue aspirin and plavix 8. Type 2 diabetes: continue ISS 9. Delirium: continue Seroquel HS 10. Overweight: provide dietary education 11. Apneic episodes: referred for outpatient sleep study, O2 ordered HS.   Liam Redhead, MD 11/19/2023

## 2023-11-19 NOTE — Progress Notes (Signed)
 Inpatient Rehabilitation Admission Medication Review by a Pharmacist  A complete drug regimen review was completed for this patient to identify any potential clinically significant medication issues.  High Risk Drug Classes Is patient taking? Indication by Medication  Antipsychotic Yes Seroquel - insomnia (PTA med)  Anticoagulant Yes Lovenox 0.5 mg/kg - DVT ppx  Antibiotic No   Opioid No   Antiplatelet Yes ASA - CVA, Plavix - CVA  Hypoglycemics/insulin Yes Insulin - Mealtime SSI  Vasoactive Medication Yes Metoprolol - HTN Lostartan - HTN  Chemotherapy No   Other Yes Crestor - CVA Vitamin D - deficiency     Type of Medication Issue Identified Description of Issue Recommendation(s)  Drug Interaction(s) (clinically significant)     Duplicate Therapy     Allergy     No Medication Administration End Date     Incorrect Dose     Additional Drug Therapy Needed     Significant med changes from prior encounter (inform family/care partners about these prior to discharge). Crestor 20 mg ordered. Patient prescribed Atorvastatin 80 mg prior to admission.  Notify patient / caregiver of medication change at discharge.  Other       Clinically significant medication issues were identified that warrant physician communication and completion of prescribed/recommended actions by midnight of the next day:  No   Time spent performing this drug regimen review (minutes):  30   Saunders Curio 11/19/2023 11:41 AM

## 2023-11-19 NOTE — H&P (Signed)
 Physical Medicine and Rehabilitation Admission H&P    Chief Complaint  Patient presents with   Code Stroke  : HPI: [name] [name] is a 83 y.o. male who presented to the ED with stroke symptoms. 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 Additional punctate subcentimeter acute ischemic nonhemorrhagic infarct was also present within the contralateral right basal ganglia. 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 He is being admitted to CIR today with right sided weakness, dysarthria, and cognitive impairment.   ROS +right sided weakness Past Medical History:  Diagnosis Date   Arthritis    Depression    Diabetes mellitus without complication (HCC)    Gout    Hyperlipidemia    Hypertension    Ischemic cardiomyopathy    History reviewed. No pertinent surgical history. Family History  Problem Relation Age of Onset   Diabetes Sister    Social History:  reports that he quit smoking about 34 years ago. His smoking use included cigarettes. He has never used smokeless tobacco. He reports current alcohol use of about 3.0 standard drinks of alcohol per week. He reports that he does not use drugs. Allergies: No Known Allergies No medications prior to admission.      Home: Home Living Family/patient expects to be discharged to:: Private residence Living Arrangements: Other relatives (lives with brother at baseline, son reports brother is not in great health either) Available Help at Discharge: Family, Available 24 hours/day Type of Home: House Home Access: Stairs to enter [employer] of Steps: back entrance 5 STE; 3 STE front, if goes home with son 1 small step to enter Entrance Stairs-Rails: Left, Right, Can reach both Home Layout: One level Bathroom Shower/Tub: Engineer, manufacturing systems: Standard Bathroom  Accessibility: Yes Home Equipment: Information systems manager, Rollator (4 wheels)  Lives With: Alone   Functional History: Prior Function Prior Level of Function : Independent/Modified Independent, Driving Mobility Comments: MOD I with rollator, pushes grocery cart through store; move in with son for a bit then went back home with brother, but likely to move back in with son. Limited driving. ADLs Comments: MOD I/IND  Functional Status:  Mobility: Bed Mobility Overal bed mobility: Needs Assistance Bed Mobility: Supine to Sit, Sit to Supine Rolling: Mod assist, +2 for physical assistance Supine to sit: Mod assist, +2 for physical assistance General bed mobility comments: Max cues for sequencing and body mechanics to sit up and scoot to the EOB. Transfers Overall transfer level: Needs assistance Equipment used: Rolling walker (2 wheels) Transfers: Sit to/from Stand Sit to Stand: +2 safety/equipment, From elevated surface, Mod assist Bed to/from chair/wheelchair/BSC transfer type:: Step pivot Step pivot transfers: Mod assist, +2 physical assistance, +2 safety/equipment General transfer comment: increased cueing, time for processing/weight shifting anterior, leans to the R side; RW management needed Ambulation/Gait Ambulation/Gait assistance: +2 safety/equipment, Mod assist Gait Distance (Feet): 5 Feet Assistive device: Rolling walker (2 wheels) Gait Pattern/deviations: Step-to pattern, Decreased step length - right, Decreased step length - left, Decreased stride length, Step-through pattern General Gait Details: increased difficulty today with steps., right lean Gait velocity: decreased    ADL: ADL Overall ADL's : Needs assistance/impaired Eating/Feeding: Supervision/ safety, Set up, Sitting Eating/Feeding Details (indicate cue type and reason): in recliner, placed splenda in coffee, took lids off items, raised table height d/t pt's height and weakness in RUE making self feeding more  difficult Lower Body Dressing: Maximal assistance, Sitting/lateral leans Lower Body Dressing Details (indicate cue type and reason): to don socks Toilet Transfer: Minimal assistance, Moderate assistance, +2 for safety/equipment, +2 for physical assistance Toilet Transfer Details (indicate cue type and reason): simulated to recliner; max verb cueing and RW management for turn to recliner Functional mobility during ADLs: Minimal assistance, Rolling walker (2 wheels), +2 for safety/equipment  Cognition: Cognition Overall Cognitive Status: Impaired/Different from baseline Arousal/Alertness: Awake/alert Orientation Level: Oriented to person Cognition Arousal: Lethargic Behavior During Therapy: Flat affect Overall Cognitive Status: Impaired/Different from baseline  Physical Exam: Blood pressure (!) 158/80, pulse 77, temperature 97.9 F (36.6 C), resp. rate 18, height 6' (1.829 m), weight 92.2 kg, SpO2 94%. Physical Exam Gen: no distress, normal appearing HEENT: oral mucosa pink and moist, NCAT Cardio: Reg rate Chest: normal effort, normal rate of breathing Abd: soft, non-distended Ext: no edema Psych: pleasant, normal affect Skin: intact Neuro: Alert, slow processing Musculoskeletal: 3/5 strength on right side  Results for orders placed or performed during the hospital encounter of 11/16/23 (from the past 48 hours)  Glucose, capillary     Status: Abnormal   Collection Time: 11/17/23 12:09 PM  Result Value Ref Range   Glucose-Capillary 109 (H) 70 - 99 mg/dL    Comment: Glucose reference range applies only to samples taken after fasting for at least 8 hours.  Glucose, capillary     Status: Abnormal   Collection Time: 11/17/23  3:45 PM  Result Value Ref Range   Glucose-Capillary 119 (H) 70 - 99 mg/dL    Comment: Glucose reference range applies only to samples taken after fasting for at least 8 hours.  Glucose, capillary     Status: Abnormal   Collection Time: 11/17/23  9:40 PM   Result Value Ref Range   Glucose-Capillary 118 (H) 70 - 99 mg/dL    Comment: Glucose reference range applies only to samples taken after fasting for at least 8 hours.  Glucose, capillary     Status: Abnormal   Collection Time: 11/18/23 12:11 AM  Result Value Ref Range   Glucose-Capillary 121 (H) 70 - 99 mg/dL    Comment: Glucose reference range applies only to samples taken after fasting for at least 8 hours.  CBC     Status: Abnormal   Collection Time: 11/18/23  5:31 AM  Result Value Ref Range   WBC 7.0 4.0 - 10.5 K/uL   RBC 5.46 4.22 - 5.81 MIL/uL   Hemoglobin 16.1 13.0 - 17.0 g/dL   HCT 16.1 09.6 - 04.5 %   MCV 85.3 80.0 - 100.0 fL   MCH 29.5 26.0 - 34.0 pg   MCHC 34.5 30.0 - 36.0 g/dL   RDW 40.9 81.1 - 91.4 %   Platelets 527 (H) 150 - 400 K/uL   nRBC 0.0 0.0 - 0.2 %    Comment: Performed at [facility], [address], [place], [place] [address]  Basic metabolic panel with GFR     Status: Abnormal   Collection Time: 11/18/23  5:31 AM  Result Value Ref Range   Sodium 139 135 - 145 mmol/L   Potassium 3.5 3.5 - 5.1 mmol/L   Chloride 104 98 - 111 mmol/L   CO2 26 22 - 32 mmol/L   Glucose, Bld 123 (H) 70 - 99 mg/dL    Comment: Glucose reference range applies only to samples taken after fasting for at least 8 hours.   BUN 16 8 - 23 mg/dL  Creatinine, Ser 0.85 0.61 - 1.24 mg/dL   Calcium 8.8 (L) 8.9 - 10.3 mg/dL   GFR, Estimated >54 >09 mL/min    Comment: (NOTE) Calculated using the CKD-EPI Creatinine Equation (2021)    Anion gap 9 5 - 15    Comment: Performed at [facility], [address] [place], [place] [address]  Phosphorus     Status: Abnormal   Collection Time: 11/18/23  5:31 AM  Result Value Ref Range   Phosphorus 2.2 (L) 2.5 - 4.6 mg/dL    Comment: Performed at [facility], [address] [place], [place] [address]  Magnesium     Status: None   Collection Time: 11/18/23  5:31 AM  Result Value Ref Range   Magnesium 2.1 1.7 -  2.4 mg/dL    Comment: Performed at [facility], [address] [place], [place] [address]  Glucose, capillary     Status: Abnormal   Collection Time: 11/18/23  7:36 AM  Result Value Ref Range   Glucose-Capillary 140 (H) 70 - 99 mg/dL    Comment: Glucose reference range applies only to samples taken after fasting for at least 8 hours.  Glucose, capillary     Status: Abnormal   Collection Time: 11/18/23 11:35 AM  Result Value Ref Range   Glucose-Capillary 118 (H) 70 - 99 mg/dL    Comment: Glucose reference range applies only to samples taken after fasting for at least 8 hours.  Glucose, capillary     Status: Abnormal   Collection Time: 11/19/23  9:26 AM  Result Value Ref Range   Glucose-Capillary 160 (H) 70 - 99 mg/dL    Comment: Glucose reference range applies only to samples taken after fasting for at least 8 hours.   ECHOCARDIOGRAM COMPLETE Result Date: 11/17/2023    ECHOCARDIOGRAM REPORT   Patient Name:   [name] [name] Date of Exam: 11/17/2023 Medical Rec #:  [id number]           Height:       72.0 in Accession #:    [id number]          Weight:       203.3 lb Date of Birth:  23-Feb-1941            BSA:          2.145 m Patient Age:    83 years            BP:           152/78 mmHg Patient Gender: M                   HR:           71 bpm. Exam Location:  [facility] Procedure: 2D Echo, Cardiac Doppler and Color Doppler (Both Spectral and Color            Flow Doppler were utilized during procedure). Indications:     Stroke  History:         Patient has no prior history of Echocardiogram examinations.                  Cardiomyopathy, CAD and Previous Myocardial Infarction, Stroke,                  Arrythmias:PVC; Risk Factors:Hypertension and Diabetes.  Sonographer:     Clarke Crouch Referring Phys:  [id number] Avi Body Diagnosing Phys: Belva Boyden MD  Sonographer Comments: Technically difficult study due to poor echo windows.  IMPRESSIONS  1. Left ventricular ejection fraction, by  estimation, is 40 to 45%. The left ventricle has mildly decreased function. 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 There is moderate left ventricular hypertrophy. Left ventricular diastolic parameters are consistent with Grade I diastolic dysfunction (impaired relaxation).  2. Right ventricular systolic function is normal. The right ventricular size is normal.  3. The mitral valve is normal in structure. No evidence of mitral valve regurgitation. No evidence of mitral stenosis.  4. The aortic valve is calcified. There is mild calcification of the aortic valve. Aortic valve regurgitation is not visualized. Aortic valve sclerosis/calcification is present, without any evidence of aortic stenosis.  5. The inferior vena cava is normal in size with greater than 50% respiratory variability, suggesting right atrial pressure of 3 mmHg. FINDINGS  Left Ventricle: Left ventricular ejection fraction, by estimation, is 40 to 45%. The left ventricle has mildly decreased function. The left ventricle demonstrates regional wall motion abnormalities. Strain was performed and the global longitudinal strain is indeterminate. The left ventricular internal cavity size was normal in size. There is moderate left ventricular hypertrophy. Left ventricular diastolic parameters are consistent with Grade I diastolic dysfunction (impaired relaxation). Right Ventricle: The right ventricular size is normal. No increase in right ventricular wall thickness. Right ventricular systolic function is normal. Left Atrium: Left atrial size was normal in size. Right Atrium: Right atrial size was normal in size. Pericardium: There is no evidence of pericardial effusion. Mitral Valve: The mitral valve is normal in structure. No evidence of mitral valve regurgitation. No evidence of mitral valve  stenosis. MV peak gradient, 3.3 mmHg. The mean mitral valve gradient is 1.0 mmHg. Tricuspid Valve: The tricuspid valve is normal in structure. Tricuspid valve regurgitation is mild . No evidence of tricuspid stenosis. Aortic Valve: The aortic valve is calcified. There is mild calcification of the aortic valve. Aortic valve regurgitation is not visualized. Aortic valve sclerosis/calcification is present, without any evidence of aortic stenosis. Aortic valve mean gradient measures 3.0 mmHg. Aortic valve peak gradient measures 6.7 mmHg. Aortic valve area, by VTI measures 2.44 cm. Pulmonic Valve: The pulmonic valve was normal in structure. Pulmonic valve regurgitation is not visualized. No evidence of pulmonic stenosis. Aorta: The aortic root is normal in size and structure. Venous: The inferior vena cava is normal in size with greater than 50% respiratory variability, suggesting right atrial pressure of 3 mmHg. IAS/Shunts: No atrial level shunt detected by color flow Doppler. Additional Comments: 3D was performed not requiring image post processing on an independent workstation and was indeterminate.  LEFT VENTRICLE PLAX 2D LVIDd:         4.60 cm   Diastology LVIDs:         3.00 cm   LV e' medial:    5.29 cm/s LV PW:         1.40 cm   LV E/e' medial:  11.6 LV IVS:        1.60 cm   LV e' lateral:   6.46 cm/s LVOT diam:     2.00 cm   LV E/e' lateral: 9.5 LV SV:         63 LV SV Index:   29 LVOT Area:     3.14 cm  RIGHT VENTRICLE RV Basal diam:  4.20 cm RV Mid diam:    3.20 cm RV S prime:  16.90 cm/s LEFT ATRIUM           Index        RIGHT ATRIUM           Index LA diam:      3.90 cm 1.82 cm/m   RA Area:     18.90 cm LA Vol (A2C): 51.4 ml 23.96 ml/m  RA Volume:   55.50 ml  25.87 ml/m LA Vol (A4C): 41.0 ml 19.11 ml/m  AORTIC VALVE                    PULMONIC VALVE AV Area (Vmax):    2.39 cm     PV Vmax:       1.56 m/s AV Area (Vmean):   2.37 cm     PV Peak grad:  9.7 mmHg AV Area (VTI):     2.44 cm AV Vmax:            129.00 cm/s AV Vmean:          83.700 cm/s AV VTI:            0.259 m AV Peak Grad:      6.7 mmHg AV Mean Grad:      3.0 mmHg LVOT Vmax:         98.20 cm/s LVOT Vmean:        63.100 cm/s LVOT VTI:          0.201 m LVOT/AV VTI ratio: 0.78  AORTA Ao Root diam: 3.80 cm Ao Asc diam:  3.30 cm MITRAL VALVE               TRICUSPID VALVE MV Area (PHT): 3.36 cm    TR Peak grad:   13.0 mmHg MV Area VTI:   2.43 cm    TR Vmax:        180.00 cm/s MV Peak grad:  3.3 mmHg MV Mean grad:  1.0 mmHg    SHUNTS MV Vmax:       0.91 m/s    Systemic VTI:  0.20 m MV Vmean:      52.5 cm/s   Systemic Diam: 2.00 cm MV Decel Time: 226 msec MV E velocity: 61.30 cm/s MV A velocity: 83.80 cm/s MV E/A ratio:  0.73 Belva Boyden MD Electronically signed by Belva Boyden MD Signature Date/Time: 11/17/2023/12:55:51 PM    Final       Blood pressure (!) 158/80, pulse 77, temperature 97.9 F (36.6 C), resp. rate 18, height 6' (1.829 m), weight 92.2 kg, SpO2 94%.  Medical Problem List and Plan: 1. 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  Antithrombotics: -DVT/anticoagulation:  Pharmaceutical: Lovenox  -antiplatelet therapy: aspirin, plavix, recommended DAPT for 90 days followed by Plavix 75mg  daily monotherapy 3. Thrombocytosis: continue DAPT 4. Ischemic cardiomyopathy: continue lopressor 5. Impaired cognition: This patient is not capable of making decisions on his own behalf. 6. HTN: continue lopressor 7.CAD s/p stent to LAD in 2012: continue aspirin and plavix 8. Type 2 diabetes: continue ISS 9. Delirium: continue Seroquel HS 10. Overweight: provide dietary education   Liam Redhead, MD 11/19/2023

## 2023-11-20 DIAGNOSIS — E1169 Type 2 diabetes mellitus with other specified complication: Secondary | ICD-10-CM

## 2023-11-20 DIAGNOSIS — I10 Essential (primary) hypertension: Secondary | ICD-10-CM

## 2023-11-20 DIAGNOSIS — N179 Acute kidney failure, unspecified: Secondary | ICD-10-CM | POA: Diagnosis not present

## 2023-11-20 DIAGNOSIS — I63512 Cerebral infarction due to unspecified occlusion or stenosis of left middle cerebral artery: Secondary | ICD-10-CM | POA: Diagnosis not present

## 2023-11-20 DIAGNOSIS — Z794 Long term (current) use of insulin: Secondary | ICD-10-CM

## 2023-11-20 DIAGNOSIS — R7989 Other specified abnormal findings of blood chemistry: Secondary | ICD-10-CM

## 2023-11-20 LAB — CBC WITH DIFFERENTIAL/PLATELET
Abs Immature Granulocytes: 0.03 10*3/uL (ref 0.00–0.07)
Basophils Absolute: 0 10*3/uL (ref 0.0–0.1)
Basophils Relative: 0 %
Eosinophils Absolute: 0.1 10*3/uL (ref 0.0–0.5)
Eosinophils Relative: 1 %
HCT: 47.6 % (ref 39.0–52.0)
Hemoglobin: 16.2 g/dL (ref 13.0–17.0)
Immature Granulocytes: 0 %
Lymphocytes Relative: 10 %
Lymphs Abs: 0.8 10*3/uL (ref 0.7–4.0)
MCH: 29.7 pg (ref 26.0–34.0)
MCHC: 34 g/dL (ref 30.0–36.0)
MCV: 87.3 fL (ref 80.0–100.0)
Monocytes Absolute: 0.7 10*3/uL (ref 0.1–1.0)
Monocytes Relative: 8 %
Neutro Abs: 6.5 10*3/uL (ref 1.7–7.7)
Neutrophils Relative %: 81 %
Platelets: 485 10*3/uL — ABNORMAL HIGH (ref 150–400)
RBC: 5.45 MIL/uL (ref 4.22–5.81)
RDW: 14.1 % (ref 11.5–15.5)
WBC: 8.2 10*3/uL (ref 4.0–10.5)
nRBC: 0 % (ref 0.0–0.2)

## 2023-11-20 LAB — GLUCOSE, CAPILLARY
Glucose-Capillary: 99 mg/dL (ref 70–99)
Glucose-Capillary: 112 mg/dL — ABNORMAL HIGH (ref 70–99)
Glucose-Capillary: 128 mg/dL — ABNORMAL HIGH (ref 70–99)
Glucose-Capillary: 138 mg/dL — ABNORMAL HIGH (ref 70–99)

## 2023-11-20 LAB — COMPREHENSIVE METABOLIC PANEL WITH GFR
ALT: 17 U/L (ref 0–44)
AST: 22 U/L (ref 15–41)
Albumin: 3.2 g/dL — ABNORMAL LOW (ref 3.5–5.0)
Alkaline Phosphatase: 76 U/L (ref 38–126)
Anion gap: 10 (ref 5–15)
BUN: 28 mg/dL — ABNORMAL HIGH (ref 8–23)
CO2: 27 mmol/L (ref 22–32)
Calcium: 8.8 mg/dL — ABNORMAL LOW (ref 8.9–10.3)
Chloride: 104 mmol/L (ref 98–111)
Creatinine, Ser: 1.4 mg/dL — ABNORMAL HIGH (ref 0.61–1.24)
GFR, Estimated: 50 mL/min — ABNORMAL LOW (ref >60)
Glucose, Bld: 114 mg/dL — ABNORMAL HIGH (ref 70–99)
Potassium: 3.7 mmol/L (ref 3.5–5.1)
Sodium: 141 mmol/L (ref 135–145)
Total Bilirubin: 1 mg/dL (ref 0.0–1.2)
Total Protein: 6.1 g/dL — ABNORMAL LOW (ref 6.5–8.1)

## 2023-11-20 LAB — VITAMIN D 25 HYDROXY (VIT D DEFICIENCY, FRACTURES): Vit D, 25-Hydroxy: 29.27 ng/mL — ABNORMAL LOW (ref 30–100)

## 2023-11-20 MED ORDER — SODIUM CHLORIDE 0.9 % IV SOLN: INTRAVENOUS | Status: DC

## 2023-11-20 NOTE — Evaluation (Addendum)
 Occupational Therapy Assessment and Plan  Patient Details  Name: [name] [name] MRN: [id number] Date of Birth: 11-Apr-1941  OT Diagnosis: abnormal posture, altered mental status, cognitive deficits, hemiplegia affecting dominant side, and muscle weakness (generalized) Rehab Potential: Rehab Potential (ACUTE ONLY): Good ELOS: 14-16 days   Today's Date: 11/20/2023 OT Individual Time: 0930-1040 OT Individual Time Calculation (min): 70 min     Hospital Problem: Principal Problem:   Acute ischemic left MCA stroke (HCC) Active Problems:   Arterial ischemic stroke, MCA, left, acute (HCC)   Past Medical History:  Past Medical History:  Diagnosis Date   Arthritis    Depression    Diabetes mellitus without complication (HCC)    Gout    Hyperlipidemia    Hypertension    Ischemic cardiomyopathy    Past Surgical History: History reviewed. No pertinent surgical history.  Assessment & Plan Clinical Impression: [name] [name] is a 83 y.o. male who presented to the ED with stroke symptoms. 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 Additional punctate subcentimeter acute ischemic nonhemorrhagic infarct was also present within the contralateral right basal ganglia. 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 He is being admitted to CIR today with right sided weakness, dysarthria, and cognitive impairment. Patient transferred to CIR on 11/19/2023 .    Patient currently requires max with basic self-care skills secondary to decreased coordination and decreased motor planning, decreased attention to right, decreased initiation, decreased attention, decreased awareness, decreased problem solving, decreased safety awareness, decreased memory, and delayed processing, and decreased sitting balance, decreased standing balance, decreased postural control,  hemiplegia, and decreased balance strategies.  Prior to hospitalization, patient could complete BADL with modified independent .  Patient will benefit from skilled intervention to decrease level of assist with basic self-care skills and increase independence with basic self-care skills prior to discharge home with care partner.  Anticipate patient will require minimal physical assistance and follow up home health and follow up outpatient.  OT - End of Session Activity Tolerance: Decreased this session Endurance Deficit: Yes Endurance Deficit Description: difficulty maintaining eyes open OT Assessment Rehab Potential (ACUTE ONLY): Good OT Patient demonstrates impairments in the following area(s): Balance;Motor;Sensory;Behavior;Cognition;Perception;Endurance OT Basic ADL's Functional Problem(s): Bathing;Dressing;Toileting OT Transfers Functional Problem(s): Toilet;Tub/Shower OT Additional Impairment(s): None OT Plan OT Intensity: Minimum of 1-2 x/day, 45 to 90 minutes OT Frequency: 5 out of 7 days OT Duration/Estimated Length of Stay: 14-16 days OT Treatment/Interventions: Balance/vestibular training;Disease mangement/prevention;Neuromuscular re-education;Patient/family education;Self Care/advanced ADL retraining;Therapeutic Exercise;UE/LE Coordination activities;Wheelchair propulsion/positioning;UE/LE Strength taining/ROM;Visual/perceptual remediation/compensation;Therapeutic Activities;Functional mobility training;DME/adaptive equipment instruction;Discharge planning;Cognitive remediation/compensation OT Self Feeding Anticipated Outcome(s): supervision OT Basic Self-Care Anticipated Outcome(s): min assist OT Toileting Anticipated Outcome(s): contact guard OT Bathroom Transfers Anticipated Outcome(s): contact guard OT Recommendation Recommendations for Other Services: Neuropsych consult Patient destination: Home Follow Up Recommendations: Home health OT;Outpatient OT Equipment Recommended:  To be determined   OT Evaluation Precautions/Restrictions  Precautions Precautions: Fall Recall of Precautions/Restrictions: Impaired Precaution/Restrictions Comments: per chart, pt has been trying to get up by himself at times. Restrictions Weight Bearing Restrictions Per Provider Order: No   Vital Signs Therapy Vitals Pulse Rate: 94 BP: 125/77 Patient Position (if appropriate): Lying Oxygen Therapy SpO2: 94 % O2 Device: Room Air Pain Pain Assessment Pain Scale: 0-10 Home Living/Prior Functioning Home Living Family/patient expects to be discharged to:: Private residence Living Arrangements: Other relatives Available Help at Discharge: Family, Available  24 hours/day Type of Home: House Home Access: Stairs to enter [employer] of Steps: back entrance 5 STE; 3 STE front, if goes home with son 1 small step to enter Entrance Stairs-Rails: Left, Right, Can reach both Home Layout: One level Bathroom Shower/Tub: Associate Professor: Yes  Lives With: Family IADL History Mode of Transportation: Set designer Occupation: Retired Prior Function Level of Independence: Independent with basic ADLs Driving: Yes Vision Baseline Vision/History: 1 Wears glasses (reading) Ability to See in Adequate Light: 0 Adequate Patient Visual Report: No change from baseline Vision Assessment?: Yes Eye Alignment: Within Functional Limits Ocular Range of Motion: Within Functional Limits Alignment/Gaze Preference: Within Defined Limits Tracking/Visual Pursuits: Able to track stimulus in all quads without difficulty Perception  Perception: Impaired Perception-Other Comments: posterior bias, mild r inattention Praxis Praxis: WFL Cognition Cognition Overall Cognitive Status: Impaired/Different from baseline Arousal/Alertness: Lethargic Orientation Level: Person;Place;Situation Person: Oriented Place: Oriented Situation: Oriented Memory:  Impaired Memory Impairment: Retrieval deficit Attention: Sustained Sustained Attention: Appears intact Awareness: Impaired Awareness Impairment: Emergent impairment Problem Solving: Impaired Problem Solving Impairment: Functional basic Executive Function: Initiating;Sequencing Sequencing: Impaired Sequencing Impairment: Functional basic Initiating: Impaired Initiating Impairment: Functional basic Behaviors: Impulsive (reported impulsive behavior upon chart review - no impulsive behavior during OT eval) Safety/Judgment: Impaired Brief Interview for Mental Status (BIMS) Repetition of Three Words (First Attempt): 3 Temporal Orientation: Year: Correct Temporal Orientation: Month: Accurate within 5 days Temporal Orientation: Day: Correct Recall: "Sock": Yes, no cue required Recall: "Blue": Yes, no cue required Recall: "Bed": Yes, no cue required BIMS Summary Score: 15 Sensation Sensation Light Touch: Impaired by gross assessment Hot/Cold: Appears Intact Proprioception: Impaired by gross assessment Stereognosis: Not tested Additional Comments: using R/L UE's spontaneously Coordination Gross Motor Movements are Fluid and Coordinated: No Fine Motor Movements are Fluid and Coordinated: No Finger Nose Finger Test: delayed response, mild tremor Motor  Motor Motor: Abnormal postural alignment and control;Motor impersistence Motor - Skilled Clinical Observations: general weakness - moreso R than L in UE  Trunk/Postural Assessment  Cervical Assessment Cervical Assessment: Exceptions to [facility] Thoracic Assessment Thoracic Assessment: Exceptions to [facility] Lumbar Assessment Lumbar Assessment: Exceptions to [facility] Postural Control Postural Control: Deficits on evaluation Trunk Control: posterior bias, difficulty with forward weight shift  Balance Balance Balance Assessed: Yes Static Sitting Balance Static Sitting - Balance Support: Right upper extremity supported;Left upper extremity  supported;Feet supported Static Sitting - Level of Assistance: 4: Min assist Dynamic Sitting Balance Dynamic Sitting - Balance Support: Right upper extremity supported;Left upper extremity supported;Feet supported Dynamic Sitting - Level of Assistance: 4: Min assist Sitting balance - Comments: occasional min assist at times for balance, post LOB's primarily Static Standing Balance Static Standing - Balance Support: Right upper extremity supported;Left upper extremity supported;During functional activity Static Standing - Level of Assistance: 3: Mod assist Extremity/Trunk Assessment RUE Assessment RUE Assessment: Exceptions to [facility] Active Range of Motion (AROM) Comments: delayed - can reach overhead ~ 110* shoulder flexion - difficulty sustaining LUE Assessment LUE Assessment: Exceptions to [facility] Active Range of Motion (AROM) Comments: delayed - can reach overhead ~ 110* shoulder flexion - difficulty sustaining  Care Tool Care Tool Self Care Eating Eating activity did not occur: Refused      Oral Care    Oral Care Assist Level: Minimal Assistance - Patient > 75%    Bathing   Body parts bathed by patient: Right arm;Left arm;Chest;Abdomen;Front perineal area;Right upper leg;Left upper leg;Face Body parts bathed by helper: Buttocks;Right lower leg   Assist  Level: Moderate Assistance - Patient 50 - 74%    Upper Body Dressing(including orthotics)       Assist Level: Minimal Assistance - Patient > 75%    Lower Body Dressing (excluding footwear)   What is the patient wearing?: Incontinence brief;Pants Assist for lower body dressing: Maximal Assistance - Patient 25 - 49%    Putting on/Taking off footwear   What is the patient wearing?: Socks Assist for footwear: Maximal Assistance - Patient 25 - 49%       Care Tool Toileting Toileting activity Toileting Activity did not occur (Clothing management and hygiene only): N/A (no void or bm)       Care Tool Bed Mobility Roll left and  right activity   Roll left and right assist level: Moderate Assistance - Patient 50 - 74%    Sit to lying activity   Sit to lying assist level: Moderate Assistance - Patient 50 - 74%    Lying to sitting on side of bed activity   Lying to sitting on side of bed assist level: the ability to move from lying on the back to sitting on the side of the bed with no back support.: Moderate Assistance - Patient 50 - 74%     Care Tool Transfers Sit to stand transfer   Sit to stand assist level: Maximal Assistance - Patient 25 - 49%    Chair/bed transfer   Chair/bed transfer assist level: Maximal Assistance - Patient 25 - 49%     Toilet transfer Toilet transfer activity did not occur: Refused       Care Tool Cognition  Expression of Ideas and Wants Expression of Ideas and Wants: 3. Some difficulty - exhibits some difficulty with expressing needs and ideas (e.g, some words or finishing thoughts) or speech is not clear  Understanding Verbal and Non-Verbal Content Understanding Verbal and Non-Verbal Content: 3. Usually understands - understands most conversations, but misses some part/intent of message. Requires cues at times to understand   Memory/Recall Ability Memory/Recall Ability : Current season;That he or she is in a hospital/hospital unit   Refer to Care Plan for Long Term Goals  SHORT TERM GOAL WEEK 1 OT Short Term Goal 1 (Week 1): Patient will transition to standing from sitting with mod assist to aide in pulling up/down LB clothing OT Short Term Goal 2 (Week 1): Patient will don LB clothing with mod assist OT Short Term Goal 3 (Week 1): Patient will trasnfer to commode with mod assist OT Short Term Goal 4 (Week 1): Patient will correctly sequence steps of familiar functional activity - eg. wash before dress, socks before shoes without cueing.  Recommendations for other services: Neuropsych   Skilled Therapeutic Intervention:   Patient lethargic during OT evaluation - yet oriented,  and with awareness to some degree of current deficits.  Patient with delayed motoric and verbal responses.  Patient pleasant, but with low level arousal throughout session which is likely impeding overall performance of BADL.  Returned to bed at end of session with bed alarm engaged, tele sitter present, and personal items in reach.  Reviewed functions on call bell.   ADL ADL Eating: Not assessed Grooming: Minimal assistance Where Assessed-Grooming: Sitting at sink Upper Body Bathing: Minimal assistance Where Assessed-Upper Body Bathing: Sitting at sink Lower Body Bathing: Maximal assistance Where Assessed-Lower Body Bathing: Standing at sink;Sitting at sink Upper Body Dressing: Minimal assistance Where Assessed-Upper Body Dressing: Sitting at sink Lower Body Dressing: Maximal assistance Where Assessed-Lower Body Dressing: Standing at  sink;Sitting at sink Toileting: Unable to assess Toilet Transfer: Unable to assess Tub/Shower Transfer: Unable to assess Tub/Shower Transfer Method: Unable to assess Film/video editor: Unable to assess Film/video editor Method: Unable to assess ADL Comments: Patient quite lethargic in OT eval.  Delayed verbal and motoric responses Mobility  Bed Mobility Bed Mobility: Rolling Left;Supine to Sit;Rolling Right;Sit to Supine Rolling Right: Moderate Assistance - Patient 50-74% Rolling Left: Moderate Assistance - Patient 50-74% Supine to Sit: Moderate Assistance - Patient 50-74% Sit to Supine: Moderate Assistance - Patient 50-74% Transfers Sit to Stand: Maximal Assistance - Patient 25-49% Stand to Sit: Moderate Assistance - Patient 50-74%   Discharge Criteria: Patient will be discharged from OT if patient refuses treatment 3 consecutive times without medical reason, if treatment goals not met, if there is a change in medical status, if patient makes no progress towards goals or if patient is discharged from hospital.  The above assessment,  treatment plan, treatment alternatives and goals were discussed and mutually agreed upon: by patient  Harvin Konicek M 11/20/2023, 12:39 PM

## 2023-11-20 NOTE — Progress Notes (Signed)
 Patient ID: [name] [name], male   DOB: 1941-08-01, 83 y.o.   MRN: [id number]  [phone] SW left message for pt son [name] to introduce self, explain role, and discuss discharge process. SW requested return phone call to discuss discharge plan.   Norval Been, MSW, LCSW Office: [phone] Cell: [phone] Fax: [phone]

## 2023-11-20 NOTE — Progress Notes (Signed)
 Patient ID: [name] [name], male   DOB: 13-Jan-1941, 83 y.o.   MRN: [id number] Met with the patient to review current medical situation, rehab process, team conference and plan of care. Reviewed thrombocytosis and CAD; with DAPT x 3 months then Plavix solo per neurology. Patient had stopped taking meds PTA; questioned about rationale, reported decision was not based on efficacy of meds or $; he just did not want to take the meds.  Reviewed HH/CMM diet, and elevated BUN with need to increase po fluids.  Patient is very drowsy or interactive during the conversation. Continue to follow along to address educational needs to facilitate preparation for discharge. Naoma Bacca

## 2023-11-20 NOTE — Discharge Instructions (Addendum)
 Inpatient Rehab Discharge Instructions  [name] L Algeo Discharge date and time: No discharge date for patient encounter.   Activities/Precautions/ Functional Status: Activity: activity as tolerated Diet: diabetic diet Wound Care: Routine skin checks Functional status:  ___ No restrictions     ___ Walk up steps independently ___ 24/7 supervision/assistance   ___ Walk up steps with assistance ___ Intermittent supervision/assistance  ___ Bathe/dress independently ___ Walk with walker     _x__ Bathe/dress with assistance ___ Walk Independently    ___ Shower independently ___ Walk with assistance    ___ Shower with assistance ___ No alcohol     ___ Return to work/school ________  Special Instructions: No driving smoking or alcohol  Continue aspirin  81 mg daily and Plavix  75 mg daily x 90 days total then Plavix  alone    STROKE/TIA DISCHARGE INSTRUCTIONS SMOKING Cigarette smoking nearly doubles your risk of having a stroke & is the single most alterable risk factor  If you smoke or have smoked in the last 12 months, you are advised to quit smoking for your health. Most of the excess cardiovascular risk related to smoking disappears within a year of stopping. Ask you doctor about anti-smoking medications [place] Quit Line: 1-800-QUIT NOW Free Smoking Cessation Classes [phone]  CHOLESTEROL Know your levels; limit fat & cholesterol in your diet  Lipid Panel     Component Value Date/Time   CHOL 155 11/17/2023 0511   TRIG 69 11/17/2023 0511   HDL 41 11/17/2023 0511   CHOLHDL 3.8 11/17/2023 0511   VLDL 14 11/17/2023 0511   LDLCALC 100 (H) 11/17/2023 0511     Many patients benefit from treatment even if their cholesterol is at goal. Goal: Total Cholesterol (CHOL) less than 160 Goal:  Triglycerides (TRIG) less than 150 Goal:  HDL greater than 40 Goal:  LDL (LDLCALC) less than 100   BLOOD PRESSURE American Stroke Association blood pressure target is less that 120/80 mm/Hg   Your discharge blood pressure is:  BP: 132/84 Monitor your blood pressure Limit your salt and alcohol intake Many individuals will require more than one medication for high blood pressure  DIABETES (A1c is a blood sugar average for last 3 months) Goal HGBA1c is under 7% (HBGA1c is blood sugar average for last 3 months)  Diabetes:    Lab Results  Component Value Date   HGBA1C 5.8 (H) 11/16/2023    Your HGBA1c can be lowered with medications, healthy diet, and exercise. Check your blood sugar as directed by your physician Call your physician if you experience unexplained or low blood sugars.  PHYSICAL ACTIVITY/REHABILITATION Goal is 30 minutes at least 4 days per week  Activity: Increase activity slowly, Therapies: Physical Therapy: Home Health Return to work:  Activity decreases your risk of heart attack and stroke and makes your heart stronger.  It helps control your weight and blood pressure; helps you relax and can improve your mood. Participate in a regular exercise program. Talk with your doctor about the best form of exercise for you (dancing, walking, swimming, cycling).  DIET/WEIGHT Goal is to maintain a healthy weight  Your discharge diet is:  Diet Order             Diet heart healthy/carb modified Room service appropriate? Yes; Fluid consistency: Thin  Diet effective now                   liquids Your height is:  Height: 6' (182.9 cm) Your current weight is: Weight: 86.6 kg  Your Body Mass Index (BMI) is:  BMI (Calculated): 25.89 Following the type of diet specifically designed for you will help prevent another stroke. Your goal weight range is:   Your goal Body Mass Index (BMI) is 19-24. Healthy food habits can help reduce 3 risk factors for stroke:  High cholesterol, hypertension, and excess weight.  RESOURCES Stroke/Support Group:  Call [phone]   STROKE EDUCATION PROVIDED/REVIEWED AND GIVEN TO PATIENT Stroke warning signs and symptoms How to activate  emergency medical system (call 911). Medications prescribed at discharge. Need for follow-up after discharge. Personal risk factors for stroke. Pneumonia vaccine given: No Flu vaccine given: No My questions have been answered, the writing is legible, and I understand these instructions.  I will adhere to these goals & educational materials that have been provided to me after my discharge from the hospital.    COMMUNITY REFERRALS UPON DISCHARGE:    Outpatient: PT       ST              Agency: [facility]  Phone:[phone]              Appointment Date/Time:*Please expect follow-up within 7-10 business days to schedule your appointment. If you have not received follow-up, be sure to contact the site directly.*   Medical Equipment/Items Ordered:rollator                                                 Agency/Supplier:[facility] [phone]     My questions have been answered and I understand these instructions. I will adhere to these goals and the provided educational materials after my discharge from the hospital.  Patient/Caregiver Signature _______________________________ Date __________  Clinician Signature _______________________________________ Date __________  Please bring this form and your medication list with you to all your follow-up doctor's appointments.

## 2023-11-20 NOTE — Progress Notes (Signed)
 Inpatient Rehabilitation  Patient information reviewed and entered into eRehab system by [facility]. Karen Kays., CCC/SLP, PPS Coordinator.  Information including medical coding, functional ability and quality indicators will be reviewed and updated through discharge.

## 2023-11-20 NOTE — Plan of Care (Signed)
  Problem: RH Swallowing Goal: LTG Pt will demonstrate functional change in swallow as evidenced by bedside/clinical objective assessment (SLP) Description: LTG: Patient will demonstrate functional change in swallow as evidenced by bedside/clinical objective assessment (SLP) Flowsheets (Taken 11/20/2023 1706) LTG: Patient will demonstrate functional change in swallow as evidenced by bedside/clinical objective assessment: Oropharyngeal swallow   Problem: RH Expression Communication Goal: LTG Patient will verbally express basic/complex needs(SLP) Description: LTG:  Patient will verbally express basic/complex needs, wants or ideas with cues  (SLP) Flowsheets (Taken 11/20/2023 1706) LTG: Patient will verbally express basic/complex needs, wants or ideas (SLP): Minimal Assistance - Patient > 75% Note: complex Goal: LTG Patient will increase speech intelligibility (SLP) Description: LTG: Patient will increase speech intelligibility at word/phrase/conversation level with cues, % of the time (SLP) Flowsheets (Taken 11/20/2023 1707) LTG: Patient will increase speech intelligibility (SLP): Minimal Assistance - Patient > 75% Percent of time patient will use intelligible speech: 80%

## 2023-11-20 NOTE — Discharge Summary (Signed)
 Physician Discharge Summary  Patient ID: [name] [name] MRN: [id number] DOB/AGE: 1940/09/04 83 y.o.  Admit date: 11/19/2023 Discharge date: 12/06/2023  Discharge Diagnoses:  Principal Problem:   Acute ischemic left MCA stroke [facility]) Active Problems:   Arterial ischemic stroke, MCA, left, acute (HCC) DVT prophylaxis Ischemic cardiomyopathy Thrombocytosis Hypertension CAD with stenting Type 2 diabetes mellitus Delirium Apneic episodes  Discharged Condition: Stable  Significant Diagnostic Studies: ECHOCARDIOGRAM COMPLETE Result Date: 11/17/2023    ECHOCARDIOGRAM REPORT   Patient Name:   [name] [name] Date of Exam: 11/17/2023 Medical Rec #:  [id number]           Height:       72.0 in Accession #:    [id number]          Weight:       203.3 lb Date of Birth:  1940/12/11            BSA:          2.145 m Patient Age:    83 years            BP:           152/78 mmHg Patient Gender: M                   HR:           71 bpm. Exam Location:  [facility] Procedure: 2D Echo, Cardiac Doppler and Color Doppler (Both Spectral and Color            Flow Doppler were utilized during procedure). Indications:     Stroke  History:         Patient has no prior history of Echocardiogram examinations.                  Cardiomyopathy, CAD and Previous Myocardial Infarction, Stroke,                  Arrythmias:PVC; Risk Factors:Hypertension and Diabetes.  Sonographer:     Clarke Crouch Referring Phys:  [id number] Avi Body Diagnosing Phys: Belva Boyden MD  Sonographer Comments: Technically difficult study due to poor echo windows. IMPRESSIONS  1. Left ventricular ejection fraction, by estimation, is 40 to 45%. The left ventricle has mildly decreased function. 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 There is moderate left ventricular hypertrophy. Left  ventricular diastolic parameters are consistent with Grade I diastolic dysfunction (impaired relaxation).  2. Right ventricular systolic function is normal. The right ventricular size is normal.  3. The mitral valve is normal in structure. No evidence of mitral valve regurgitation. No evidence of mitral stenosis.  4. The aortic valve is calcified. There is mild calcification of the aortic valve. Aortic valve regurgitation is not visualized. Aortic valve sclerosis/calcification is present, without any evidence of aortic stenosis.  5. The inferior vena cava is normal in size with greater than 50% respiratory variability, suggesting right atrial pressure of 3 mmHg. FINDINGS  Left Ventricle: Left ventricular ejection fraction, by estimation, is 40 to 45%. The left ventricle has mildly decreased function. The left ventricle demonstrates regional wall motion abnormalities. Strain was performed and the global longitudinal strain is indeterminate. The left ventricular internal cavity size was normal in size. There is moderate left ventricular hypertrophy. Left ventricular diastolic parameters are consistent with Grade I diastolic dysfunction (impaired relaxation). Right Ventricle: The right ventricular size is normal. No  increase in right ventricular wall thickness. Right ventricular systolic function is normal. Left Atrium: Left atrial size was normal in size. Right Atrium: Right atrial size was normal in size. Pericardium: There is no evidence of pericardial effusion. Mitral Valve: The mitral valve is normal in structure. No evidence of mitral valve regurgitation. No evidence of mitral valve stenosis. MV peak gradient, 3.3 mmHg. The mean mitral valve gradient is 1.0 mmHg. Tricuspid Valve: The tricuspid valve is normal in structure. Tricuspid valve regurgitation is mild . No evidence of tricuspid stenosis. Aortic Valve: The aortic valve is calcified. There is mild calcification of the aortic valve. Aortic valve  regurgitation is not visualized. Aortic valve sclerosis/calcification is present, without any evidence of aortic stenosis. Aortic valve mean gradient measures 3.0 mmHg. Aortic valve peak gradient measures 6.7 mmHg. Aortic valve area, by VTI measures 2.44 cm. Pulmonic Valve: The pulmonic valve was normal in structure. Pulmonic valve regurgitation is not visualized. No evidence of pulmonic stenosis. Aorta: The aortic root is normal in size and structure. Venous: The inferior vena cava is normal in size with greater than 50% respiratory variability, suggesting right atrial pressure of 3 mmHg. IAS/Shunts: No atrial level shunt detected by color flow Doppler. Additional Comments: 3D was performed not requiring image post processing on an independent workstation and was indeterminate.  LEFT VENTRICLE PLAX 2D LVIDd:         4.60 cm   Diastology LVIDs:         3.00 cm   LV e' medial:    5.29 cm/s LV PW:         1.40 cm   LV E/e' medial:  11.6 LV IVS:        1.60 cm   LV e' lateral:   6.46 cm/s LVOT diam:     2.00 cm   LV E/e' lateral: 9.5 LV SV:         63 LV SV Index:   29 LVOT Area:     3.14 cm  RIGHT VENTRICLE RV Basal diam:  4.20 cm RV Mid diam:    3.20 cm RV S prime:     16.90 cm/s LEFT ATRIUM           Index        RIGHT ATRIUM           Index LA diam:      3.90 cm 1.82 cm/m   RA Area:     18.90 cm LA Vol (A2C): 51.4 ml 23.96 ml/m  RA Volume:   55.50 ml  25.87 ml/m LA Vol (A4C): 41.0 ml 19.11 ml/m  AORTIC VALVE                    PULMONIC VALVE AV Area (Vmax):    2.39 cm     PV Vmax:       1.56 m/s AV Area (Vmean):   2.37 cm     PV Peak grad:  9.7 mmHg AV Area (VTI):     2.44 cm AV Vmax:           129.00 cm/s AV Vmean:          83.700 cm/s AV VTI:            0.259 m AV Peak Grad:      6.7 mmHg AV Mean Grad:      3.0 mmHg LVOT Vmax:         98.20 cm/s LVOT Vmean:  63.100 cm/s LVOT VTI:          0.201 m LVOT/AV VTI ratio: 0.78  AORTA Ao Root diam: 3.80 cm Ao Asc diam:  3.30 cm MITRAL VALVE                TRICUSPID VALVE MV Area (PHT): 3.36 cm    TR Peak grad:   13.0 mmHg MV Area VTI:   2.43 cm    TR Vmax:        180.00 cm/s MV Peak grad:  3.3 mmHg MV Mean grad:  1.0 mmHg    SHUNTS MV Vmax:       0.91 m/s    Systemic VTI:  0.20 m MV Vmean:      52.5 cm/s   Systemic Diam: 2.00 cm MV Decel Time: 226 msec MV E velocity: 61.30 cm/s MV A velocity: 83.80 cm/s MV E/A ratio:  0.73 Belva Boyden MD Electronically signed by Belva Boyden MD Signature Date/Time: 11/17/2023/12:55:51 PM    Final    MR BRAIN WO CONTRAST Result Date: 11/16/2023 CLINICAL DATA:  Initial evaluation for acute neuro deficit, stroke suspected. EXAM: MRI HEAD WITHOUT CONTRAST TECHNIQUE: Multiplanar, multiecho pulse sequences of the brain and surrounding structures were obtained without intravenous contrast. COMPARISON:  CTs from earlier the same day. FINDINGS: Brain: Cerebral volume within normal limits. Patchy T2/FLAIR hyperintensity involving the periventricular and deep white matter both cerebral hemispheres, consistent with chronic small vessel ischemic disease, moderately advanced in nature. Few scatter remote lacunar infarcts present about the right frontal centrum semi ovale, basal ganglia, and thalami. Patchy restricted diffusion seen involving the cortical to subcortical aspect of the left frontal lobe, consistent with a small left MCA distribution infarct (series 5, image 45). Additional punctate subcentimeter acute ischemic infarct noted within the contralateral right basal ganglia (series 5, image 34). No associated hemorrhage or mass effect. No other evidence for acute or subacute ischemia. Gray-white matter differentiation otherwise maintained. No acute intracranial hemorrhage. Few small foci of susceptibility artifact noted at the level of the left sylvian fissure, suspected to reflect thrombus within distal left MCA branches related to the overlying left MCA distribution infarct (series 12, image 43). No mass lesion, midline shift or  mass effect. No hydrocephalus or extra-axial fluid collection. Pituitary gland within normal limits. Vascular: Loss of normal flow void within the left vertebral artery, consistent with previously identified chronic occlusion. Skull and upper cervical spine: Craniocervical junction within normal limits. Bone marrow signal intensity normal. No scalp soft tissue abnormality. Sinuses/Orbits: Globes orbital soft tissues within normal limits. Changes of chronic right maxillary sinus disease noted. Paranasal sinuses are otherwise largely clear. Trace left mastoid effusion noted, of doubtful significance. Other: None. IMPRESSION: 1. Small acute nonhemorrhagic left MCA distribution infarct involving the left frontal lobe. No associated mass effect. 2. Additional punctate subcentimeter acute ischemic nonhemorrhagic infarct within the contralateral right basal ganglia. 3. Underlying moderately advanced chronic microvascular ischemic disease with a few scattered remote lacunar infarcts as above. 4. Chronic occlusion of the left vertebral artery. Electronically Signed   By: Virgia Griffins M.D.   On: 11/16/2023 23:48   CT ANGIO HEAD NECK W WO CM (CODE STROKE) Result Date: 11/16/2023 CLINICAL DATA:  Code stroke, neuro deficit, right-sided facial droop, mild aphasia, left gaze preference. EXAM: CT ANGIOGRAPHY HEAD AND NECK WITH AND WITHOUT CONTRAST TECHNIQUE: Multidetector CT imaging of the head and neck was performed using the standard protocol during bolus administration of intravenous contrast. Multiplanar CT image reconstructions and MIPs  were obtained to evaluate the vascular anatomy. Carotid stenosis measurements (when applicable) are obtained utilizing NASCET criteria, using the distal internal carotid diameter as the denominator. RADIATION DOSE REDUCTION: This exam was performed according to the departmental dose-optimization program which includes automated exposure control, adjustment of the mA and/or kV  according to patient size and/or use of iterative reconstruction technique. CONTRAST:  75mL OMNIPAQUE  IOHEXOL  350 MG/ML SOLN COMPARISON:  Same-day head CT. FINDINGS: CTA NECK FINDINGS Aortic arch: Standard configuration of the aortic arch. Imaged portion shows no evidence of aneurysm or dissection. No significant stenosis of the major arch vessel origins. Pulmonary arteries: As permitted by contrast timing, there are no filling defects in the visualized pulmonary arteries. Subclavian arteries: The subclavian arteries are patent bilaterally. Right carotid system: Patent. Mild atherosclerosis at the carotid bifurcation without hemodynamically significant stenosis. No evidence of dissection. Tortuosity of the mid cervical ICA. Left carotid system: Patent. Mild atherosclerosis at the carotid bifurcation without hemodynamically significant stenosis. No evidence of dissection. Vertebral arteries: Right vertebral artery is dominant. The right vertebral artery is patent from the origin to the vertebrobasilar confluence. Atherosclerosis of the right V4 segment resulting in mild-to-moderate stenosis. There is atherosclerosis at the origin of the non dominant left vertebral artery with limited evaluation of stenosis which is at least moderate in severity. The left vertebral artery is patent to the proximal V3 segment. The vertebral artery is occluded from this level to the vertebrobasilar confluence. Prominent focal atherosclerosis along the distal left V4 segment. Skeleton: No acute findings. Degenerative changes in the cervical spine. Intervertebral disc space narrowing greatest at C5-6 through C7-T1. Edentulous maxilla and mandible. Mucosal thickening in the right maxillary sinus with findings suggestive of mucoperiosteal reaction. Other neck: The visualized airway is patent. No cervical lymphadenopathy. Subcentimeter thyroid nodules. Upper chest: Centrilobular emphysema in the visualized upper lobes with additional  paraseptal emphysema in the lung apices most pronounced on the right. Review of the MIP images confirms the above findings CTA HEAD FINDINGS ANTERIOR CIRCULATION: The intracranial ICAs are patent bilaterally. Atherosclerosis of the bilateral carotid siphons. There is mild stenosis of the right supraclinoid ICA. No high-grade stenosis, proximal occlusion, aneurysm, or vascular malformation. MCAs: The right M1 segment is patent. There is severe stenosis at the origin of a M2 superior division branch. Short-segment occlusion of an M2 inferior division branch with reconstitution within the sylvian fissure. The left M1 segment is patent. There is mild narrowing of an M2 inferior division branch. Severe stenosis and subtotal occlusion of an M2 inferior division branch of the left MCA with additional multifocal severe stenosis of M3 and M4 branches within this distribution. ACAs: The A1 and A2 segments are patent bilaterally. Moderate stenosis of the bilateral A3 segments. Additional focal severe stenosis of the left A4 segment. POSTERIOR CIRCULATION: PCAs: Patent bilaterally. Focal severe stenosis of the proximal P3 segment right PCA. Additional severe stenosis of the posterior P2 segment and proximal P3 segment of the left PCA. Pcomm: Visualized on the right. SCAs: The superior cerebellar arteries are patent bilaterally. Basilar artery: Patent AICAs: Patent PICAs: Visualized on the right. Vertebral arteries: Patent on the right. Venous sinuses: As permitted by contrast timing, patent. Anatomic variants: None Review of the MIP images confirms the above findings IMPRESSION: No large vessel occlusion or evidence of findings amenable to neurovascular intervention. 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 Multiple intracranial vascular stenoses as above.  Focal short segment occlusion of an M2 inferior division branch of  the right MCA with reconstitution noted. Mild-to-moderate stenosis of the V4 segment right vertebral artery. Emphysema (ICD10-J43.9). Electronically Signed   By: Denny Flack M.D.   On: 11/16/2023 15:50   CT HEAD CODE STROKE WO CONTRAST Result Date: 11/16/2023 CLINICAL DATA:  Code stroke. Right facial droop and slurred speech. Last known well at 9:15 a.m. EXAM: CT HEAD WITHOUT CONTRAST TECHNIQUE: Contiguous axial images were obtained from the base of the skull through the vertex without intravenous contrast. RADIATION DOSE REDUCTION: This exam was performed according to the departmental dose-optimization program which includes automated exposure control, adjustment of the mA and/or kV according to patient size and/or use of iterative reconstruction technique. COMPARISON:  None Available. FINDINGS: Brain: Moderate atrophy and white matter changes are present bilaterally. Age indeterminate infarcts are present in the anterior thalami bilaterally, more prominent right than left. Subtle hypoattenuation is present at the anterior limb of the right internal capsule. A subcortical white matter infarct is present in the anterior right frontal lobe superior to the frontal horn of the right lateral ventricle. An age indeterminate infarct is present in the left lentiform nucleus. No other acute cortical infarcts are present. No acute hemorrhage or mass lesion is present. No significant extra-axial fluid collections are present. The ventricles are proportionate to the degree of atrophy. The brainstem and cerebellum are within normal limits. Midline structures are within normal limits. Vascular: Atherosclerotic calcifications are present at the cavernous internal carotid arteries and at the dural margin of both vertebral arteries. No hyperdense vessel is present. Skull: Calvarium is intact. No focal lytic or blastic lesions are present. No significant extracranial soft tissue lesion is present. Sinuses/Orbits: The paranasal  sinuses and mastoid air cells are clear. The globes and orbits are within normal limits. ASPECTS [facility] Stroke Program Early CT Score) - Ganglionic level infarction (caudate, lentiform nuclei, internal capsule, insula, M1-M3 cortex): 6/7 - Supraganglionic infarction (M4-M6 cortex): 3/3 Total score (0-10 with 10 being normal): 9/10 IMPRESSION: 1. 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 2. Subtle hypoattenuation at the anterior limb of the right internal capsule. 3. 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 4. Age indeterminate infarct in the left lentiform nucleus. 5. Moderate atrophy and white matter disease likely reflects the sequela of chronic microvascular ischemia. 6. No acute hemorrhage or mass lesion. These results were called by telephone at the time of interpretation on 11/16/2023 at 3:13 pm to provider Dr. Doretta Gant, who verbally acknowledged these results. Electronically Signed   By: Audree Leas M.D.   On: 11/16/2023 15:14    Labs:  Basic Metabolic Panel: No results for input(s): "NA", "K", "CL", "CO2", "GLUCOSE", "BUN", "CREATININE", "CALCIUM ", "MG", "PHOS" in the last 168 hours.   CBC: No results for input(s): "WBC", "NEUTROABS", "HGB", "HCT", "MCV", "PLT" in the last 168 hours.   CBG: Recent Labs  Lab 11/30/23 2131 12/01/23 0702 12/01/23 1135 12/01/23 1629 12/01/23 2019  GLUCAP 149* 113* 119* 113* 127*     Brief HPI:   Ray Toyama Czajkowski is a 83 y.o. right-handed male with history significant for type 2 diabetes mellitus hypertension hyperlipidemia obstructive CAD status post BMS to mid LAD 2012, ischemic cardiomyopathy, chronic thrombocytosis dating back to 2016, frequent PVCs.  Per chart review patient lives with brother.  1 level home 5 steps to entry.  Modified independent  using a rollator in driving prior to admission.  He  does have a son in the area.  Presented 11/16/2023 to [facility] with right facial droop left gaze preference and dysarthria.  CT/MRI showed small acute nonhemorrhagic left MCA distribution infarction involving the left frontal lobe.  No associated mass effect.  Patient did not receive TNK.  CTA showed no large vessel occlusion or evidence of finding amenable to neurovascular intervention.  Admission chemistries unremarkable except potassium 3.2 glucose 184, WBC 12,100, hemoglobin A1c 5.8.  Echocardiogram ejection fraction of 40 to 45%.  The left ventricle demonstrates regional wall motion abnormalities hypokinesis of the anterior septal, anterior and apical region with aneurysmal region in the distal anterior septal/anterior and apical region.  Grade 1 diastolic dysfunction.  Neurology follow-up patient maintained on low-dose aspirin  as well as Plavix  75 mg daily for CVA prophylaxis x 90 days then Plavix  alone.  Lovenox  for DVT prophylaxis.  Hospital course patient with episodes of apnea recommendations of sleep study as an outpatient check nocturnal pulse ox and use oxygen at night as needed.  Patient with history of chronic systolic and diastolic CHF/ischemic cardiomyopathy ejection fraction 40 to 45% followed outpatient at [facility] cardiology.  Monitoring for any signs of fluid overload.  Patient did have initial acute delirium sundowning placed on Seroquel  did improve monitoring of sleep cycle.  Therapy evaluations completed and patient was admitted for a comprehensive rehab program.   Hospital Course: Travell Ludeke Connell was admitted to rehab 11/19/2023 for inpatient therapies to consist of PT, ST and OT at least three hours five days a week. Past admission physiatrist, therapy team and rehab RN have worked together to provide customized collaborative inpatient rehab.  Pertaining to patient's left MCA infarction remained stable he will continue aspirin  and Plavix  x 90 days then Plavix  alone follow-up neurology  services.  Ischemic cardiomyopathy followed by outpatient [facility] maintained on Lopressor  as well as Cozaar  with no chest pain or shortness of breath monitoring for any signs of fluid overload.  He also had a history of CAD with stenting no chest pain noted and continue to monitor.  Type 2 diabetes mellitus hemoglobin A1c 5.8.  Initial course delirium placed on Seroquel  at bedtime monitoring of sleep cycle which was later discontinued and the addition of amantadine .  Apneic episodes referred by outpatient sleep study monitoring oxygen saturations at bedtime.   Blood pressures were monitored on TID basis and remained controlled and monitored  Diabetes has been monitored with ac/hs CBG checks and SSI was use prn for tighter BS control.    Rehab course: During patient's stay in rehab weekly team conferences were held to monitor patient's progress, set goals and discuss barriers to discharge. At admission, patient required mod assist 5 feet rolling walker mod assist at pivot transfers  Physical exam.  Blood pressure 120/80 pulse 80 temperature 98 respirations 18 oxygen saturation 92% room air Constitutional.  No acute distress HEENT Head.  Normocephalic and atraumatic Eyes.  Pupils round and reactive to light no discharge without nystagmus Neck.  Supple nontender no JVD without thyromegaly Cardiac regular rate and rhythm without any extra sounds or murmur heard Abdomen.  Soft nontender positive bowel sounds without rebound Respiratory effort normal no respiratory distress without wheeze Neurologic.  Alert slow to process Musculoskeletal 3/5 strength on the right side except for 0/5 right plantar dorsiflexion  He/She  has had improvement in activity tolerance, balance, postural control as well as ability to compensate for deficits. He/She has had  improvement in functional use RUE/LUE  and RLE/LLE as well as improvement in awareness.  Patient perform sit to supine with supervision with  use of bed rail.  Verbal cues provided for repositioning.  Perform sit to stand throughout session with rolling walker contact-guard minimal assist.  Patient ambulates loop around reflection garden outside in loop around main gym with rolling walker contact-guard.  Navigates 6 inch steps with bilateral handrails and contact-guard.  During ADLs he was able to transfer from supine to bed to edge of bed with cues.  Completed a stand pivot transfer from bed raised slightly to accommodate his needs in the home at moderate assist for coming into standing and contact-guard for transferring.  Transported to the restroom and was able to doff his overhead shirt with contact-guard.  He was able to transfer onto the shower chair with cueing for adjusting his bottom.  He was able to wash upper body and upper portion of bilateral lower extremity inclusive of private area with supervision.  He was able to exit the shower by coming from sit to stand using the grab bars with minimal assist.  Patient able to transfer to wheelchair contact-guard minimal assist for placement.  SL P follow-up for monitoring of cognition administered SLUMS examination with patient initially scoring 8/30 showing deficits across domains including immediate memory delayed working memory attention and visual-spatial skills with executive functioning.  Given moderate verbal cues patient utilized loud and slow strategies to achieve 100% intelligibility at the spontaneous word level and 80% intelligibility at the sentence level.  Full family teaching completed plan discharge to home       Disposition:  There are no questions and answers to display.         Diet: Diabetic diet  Special Instructions: No driving smoking or alcohol  Follow-up [facility]ology Dr. Toula French  Patient will need follow-up with PCP to arrange outpatient sleep study.  Medications at discharge. 1.  Tylenol  as needed 2.  Aspirin  81 mg p.o. daily 3.  Plavix  75  mg p.o. daily 4.  Cozaar  50 mg p.o. daily 5.  Lopressor  25 mg p.o. twice daily 6.  Crestor  20 mg p.o. daily 7.  Vitamin D  50,000 units every 7 days 8.  Trazodone  50 mg nightly 9.  Amantadine  100 mg daily 10.  Magnesium  gluconate 500 mg nightly   30-35 minutes were spent completing discharge summary discharge planning  Discharge Instructions     Ambulatory referral to Neurology   Complete by: As directed    An appointment is requested in approximately: 4 weeks left MCA infarction   Ambulatory referral to Physical Medicine Rehab   Complete by: As directed    Moderate complexity follow-up 1 to 2 weeks left MCA infarction   Pulmonary Visit   Complete by: As directed    Evaluate and treat sleep study possible need for CPAP   Reason for referral: Pulmonary + Sleep        Follow-up Information     Raulkar, Keven Pel, MD Follow up.   Specialty: Physical Medicine and Rehabilitation Why: Office to call for appointment Contact information: [address] [place] [address] [phone]         Sketch, Deeann Fare., MD Follow up.   Specialty: Cardiology Why: Call for appointment Contact information: [address] [place] [facility] [place] [address] [phone]                 Signed: Sterling Eisenmenger 12/04/2023, 5:33  AM

## 2023-11-20 NOTE — Evaluation (Signed)
 Speech Language Pathology Assessment and Plan  Patient Details  Name: [name] [name] MRN: [id number] Date of Birth: [birth year]  SLP Diagnosis: Dysarthria;Speech and Language deficits  Rehab Potential: Good ELOS: 2 weeks    Today's Date: 11/20/2023 SLP Individual Time: 0800-0900 SLP Individual Time Calculation (min): 60 min   Hospital Problem: Principal Problem:   Acute ischemic left MCA stroke (HCC) Active Problems:   Arterial ischemic stroke, MCA, left, acute (HCC)  Past Medical History:  Past Medical History:  Diagnosis Date   Arthritis    Depression    Diabetes mellitus without complication (HCC)    Gout    Hyperlipidemia    Hypertension    Ischemic cardiomyopathy    Past Surgical History: History reviewed. No pertinent surgical history.  Assessment / Plan / Recommendation Clinical Impression Pt is a 83 y.o. male who presented to the ED with stroke symptoms. 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 Additional punctate subcentimeter acute ischemic nonhemorrhagic infarct was also present within the contralateral right basal ganglia. 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 He is being admitted to CIR today with right sided weakness, dysarthria, and cognitive impairment.   Cognitive-Linguistic: Pt presented w/ a severe dysarthria characterized by reduced vocal intensity, articulatory precision, and prosody. At word level pt remained ~60% intelligible. However, w/o context, pt was <25% intelligible at sentence level. With context his intelligibility still remained <50%. He demonstrated adequate awareness of motor speech errors and adequate auditory comprehension. Additionally, adequate word finding noted during simple eval tasks, though very mild specific word finding deficits were evident in tasks of increasing complexity and  conversation. Cognition informally assessed via conversational tasks d/t the severity of pt's speech intelligibility deficits. He demonstrated adequate orientation, recall of recent events leading up to hospitalization, and verbal problem solving during conversational eval tasks. Notable lingual weakness evident during OME, and he would likely benefit from introduction of IOPI to target improved tongue strength. Recommend cont ST services to target speech intelligibility and mild word finding deficits to facilitate improved communication of wants, needs, and thoughts.   He was observed with thin liquids via straw throughout the eval, and cough x2 was noted across ~8 oz. Unable to assess solids d/t time constraints, though pt would benefit from dysphagia eval to determine need for further dysphagia intervention. He did present w/ a significant amount of difficulty bringing the cup and straw to his mouth for PO intake, would benefit from full supervision to assist w/ self feeding during meals. He also demonstrated difficulty moving the pills from the front to the back of his mouth during med pass and requested to trial meds whole in puree.      Skilled Therapeutic Interventions          SLP facilitated a cognitive-linguistic evaluation and brief bedside swallow screen to assess pt's cognitive-communication skills and determine need for additional skilled ST services. See above for more information.    SLP Assessment  Patient will need skilled Speech Lanaguage Pathology Services during CIR admission    Recommendations  SLP Diet Recommendations: Thin;Age appropriate regular solids Liquid Administration via: Straw Medication Administration: Whole meds with puree Supervision: Staff to assist with self feeding;Full supervision/cueing for compensatory strategies Compensations: Slow rate;Small sips/bites;Lingual sweep for clearance of pocketing Postural Changes and/or Swallow Maneuvers: Upright 30-60 min  after meal;Seated upright 90 degrees Oral Care Recommendations: Oral  care BID Recommendations for Other Services: Neuropsych consult;Therapeutic Recreation consult Therapeutic Recreation Interventions: Pet therapy;Stress management Patient destination: Home Follow up Recommendations: Outpatient SLP;Home Health SLP Equipment Recommended: To be determined    SLP Frequency 3 to 5 out of 7 days   SLP Duration  SLP Intensity  SLP Treatment/Interventions 2 weeks  Minumum of 1-2 x/day, 30 to 90 minutes  Dysphagia/aspiration precaution training;Internal/external aids;Speech/Language facilitation;Therapeutic Activities;Oral motor exercises;Environmental controls;Cueing hierarchy;Functional tasks;Patient/family education;Multimodal communication approach;Therapeutic Exercise    Pain  No pain reported   SLP Evaluation Cognition Overall Cognitive Status: Difficult to assess Orientation Level: Oriented X4 Year: 2025 Month: April Day of Week: Correct Attention: Selective Selective Attention: Appears intact Memory: Appears intact  Comprehension Auditory Comprehension Overall Auditory Comprehension: Appears within functional limits for tasks assessed Expression Expression Primary Mode of Expression: Verbal Verbal Expression Overall Verbal Expression: Impaired Initiation: No impairment Repetition: Impaired Level of Impairment: Sentence level Naming: Impairment Responsive: 76-100% accurate Confrontation: Within functional limits Convergent: Not tested Divergent: Not tested Other Naming Comments: mild specific word finding deficits in conversation Pragmatics: No impairment Interfering Components: Speech intelligibility Non-Verbal Means of Communication: Not applicable Written Expression Dominant Hand: Right Written Expression: Not tested Oral Motor Oral Motor/Sensory Function Overall Oral Motor/Sensory Function: Moderate impairment Facial ROM: Reduced right;Suspected CN VII  (facial) dysfunction Facial Symmetry: Abnormal symmetry right;Suspected CN VII (facial) dysfunction Facial Strength: Reduced right;Suspected CN VII (facial) dysfunction Lingual ROM: Reduced left;Reduced right Lingual Symmetry: Within Functional Limits Lingual Strength: Reduced Motor Speech Overall Motor Speech: Impaired Respiration: Within functional limits Phonation: Low vocal intensity Resonance: Within functional limits Articulation: Impaired Level of Impairment: Word Intelligibility: Intelligibility reduced Word: 50-74% accurate Phrase: 25-49% accurate Sentence: 0-24% accurate Conversation: 0-24% accurate Motor Speech Errors: Aware Effective Techniques: Slow rate;Increased vocal intensity;Over-articulate;Pause  Care Tool Care Tool Cognition Ability to hear (with hearing aid or hearing appliances if normally used Ability to hear (with hearing aid or hearing appliances if normally used): 1. Minimal difficulty - difficulty in some environments (e.g. when person speaks softly or setting is noisy)   Expression of Ideas and Wants Expression of Ideas and Wants: 2. Frequent difficulty - frequently exhibits difficulty with expressing needs and ideas   Understanding Verbal and Non-Verbal Content Understanding Verbal and Non-Verbal Content: 4. Understands (complex and basic) - clear comprehension without cues or repetitions  Memory/Recall Ability Memory/Recall Ability : Current season;That he or she is in a hospital/hospital unit   Motor Speech Assessment   Intelligibility: Intelligibility reduced Word: 50-74% accurate Phrase: 25-49% accurate Sentence: 0-24% accurate Conversation: 0-24% accurate  Short Term Goals: Week 1: SLP Short Term Goal 1 (Week 1): Pt will participate in a structured eval and/or diagnostic tx tasks to determine need for further dysphagia intervention SLP Short Term Goal 2 (Week 1): Pt will complete EMST w/ modA to facilitate increased breath support during  speech production SLP Short Term Goal 3 (Week 1): Pt will recall compensatory speech strategies in 3/4 opportunities given minA SLP Short Term Goal 4 (Week 1): Pt will utilize compensatory speech strategies at phrase level to maintain 50% intelligibility or greater given modA SLP Short Term Goal 5 (Week 1): Pt will complete mildly specific word finding tasks w/ supervisionA  Refer to Care Plan for Long Term Goals  Recommendations for other services: Neuropsych and Therapeutic Recreation  Pet therapy and Stress management  Discharge Criteria: Patient will be discharged from SLP if patient refuses treatment 3 consecutive times without medical reason, if treatment goals not met, if there is a  change in medical status, if patient makes no progress towards goals or if patient is discharged from hospital.  The above assessment, treatment plan, treatment alternatives and goals were discussed and mutually agreed upon: by patient  Rozell Cornet 11/20/2023, 4:45 PM

## 2023-11-20 NOTE — Plan of Care (Signed)
  Problem: Consults Goal: RH STROKE PATIENT EDUCATION Description: See Patient Education module for education specifics  Outcome: Progressing Goal: Nutrition Consult-if indicated Outcome: Progressing Goal: Diabetes Guidelines if Diabetic/Glucose > 140 Description: If diabetic or lab glucose is > 140 mg/dl - Initiate Diabetes/Hyperglycemia Guidelines & Document Interventions  Outcome: Progressing   Problem: RH BOWEL ELIMINATION Goal: RH STG MANAGE BOWEL WITH ASSISTANCE Description: STG Manage Bowel with Mod I assist Assistance. Outcome: Progressing Goal: RH STG MANAGE BOWEL W/MEDICATION W/ASSISTANCE Description: STG Manage Bowel with Medication with mod I Assistance. Outcome: Progressing   Problem: RH BLADDER ELIMINATION Goal: RH STG MANAGE BLADDER WITH ASSISTANCE Description: STG Manage Bladder With min Assistance Outcome: Progressing   Problem: RH SAFETY Goal: RH STG ADHERE TO SAFETY PRECAUTIONS W/ASSISTANCE/DEVICE Description: STG Adhere to Safety Precautions With supervision Assistance/Device. Outcome: Progressing Goal: RH STG DECREASED RISK OF FALL WITH ASSISTANCE Description: STG Decreased Risk of Fall With min Assistance. Outcome: Progressing   Problem: RH COGNITION-NURSING Goal: RH STG USES MEMORY AIDS/STRATEGIES W/ASSIST TO PROBLEM SOLVE Description: STG Uses Memory Aids/Strategies With min Assistance to Problem Solve. Outcome: Progressing Goal: RH STG ANTICIPATES NEEDS/CALLS FOR ASSIST W/ASSIST/CUES Description: STG Anticipates Needs/Calls for Assist With min Assistance/Cues. Outcome: Progressing   Problem: RH KNOWLEDGE DEFICIT Goal: RH STG INCREASE KNOWLEDGE OF DIABETES Description: Patient and family will be able to manage DM using educational resources for medications and dietary modification independently Outcome: Not Progressing Goal: RH STG INCREASE KNOWLEDGE OF HYPERTENSION Description: Patient and family will be able to manage HTN using educational  resources for medications and dietary modification independently Outcome: Not Progressing Goal: RH STG INCREASE KNOWLEGDE OF HYPERLIPIDEMIA Description: Patient and family will be able to manage HLD using educational resources for medications and dietary modification independently Outcome: Not Progressing Goal: RH STG INCREASE KNOWLEDGE OF STROKE PROPHYLAXIS Description: Patient and family will be able to manage secondary risks using educational resources for medications and dietary modification independently Outcome: Not Progressing

## 2023-11-20 NOTE — Progress Notes (Signed)
 BUN/Cr noted to be elevated. Pt educated on importance of fluid intake. Fluids provided.  Varney Gentleman, LPN

## 2023-11-20 NOTE — Progress Notes (Signed)
 PROGRESS NOTE   Subjective/Complaints: No new concerns elicited today.  No acute events overnight.   ROS: Denies CP, SOB, N/V   Objective:   No results found. Recent Labs    11/18/23 0531 11/20/23 0529  WBC 7.0 8.2  HGB 16.1 16.2  HCT 46.6 47.6  PLT 527* 485*   Recent Labs    11/18/23 0531 11/20/23 0529  NA 139 141  K 3.5 3.7  CL 104 104  CO2 26 27  GLUCOSE 123* 114*  BUN 16 28*  CREATININE 0.85 1.40*  CALCIUM 8.8* 8.8*    Intake/Output Summary (Last 24 hours) at 11/20/2023 1104 Last data filed at 11/20/2023 0842 Gross per 24 hour  Intake 640 ml  Output 141 ml  Net 499 ml        Physical Exam: Vital Signs Blood pressure 125/77, pulse 94, temperature (!) 97.5 F (36.4 C), temperature source Oral, resp. rate 18, height 6' (1.829 m), weight 86.6 kg, SpO2 94%.   Gen: no distress,  appears comfortable laying in bed  HEENT: oral mucosa pink and moist, NCAT Cardio: RRR Chest: CTAB, normal effort, normal rate of breathing Abd: soft, non-distended, +BS Ext: no edema Psych: pleasant, flat affect Skin: intact Neuro: Alert, slow processing, oriented to person only Musculoskeletal: 3/5 strength on right side Assessment/Plan: 1. Functional deficits which require 3+ hours per day of interdisciplinary therapy in a comprehensive inpatient rehab setting. Physiatrist is providing close team supervision and 24 hour management of active medical problems listed below. Physiatrist and rehab team continue to assess barriers to discharge/monitor patient progress toward functional and medical goals  Care Tool:  Bathing              Bathing assist       Upper Body Dressing/Undressing Upper body dressing        Upper body assist      Lower Body Dressing/Undressing Lower body dressing            Lower body assist       Toileting Toileting    Toileting assist       Transfers Chair/bed  transfer  Transfers assist           Locomotion Ambulation   Ambulation assist              Walk 10 feet activity   Assist           Walk 50 feet activity   Assist           Walk 150 feet activity   Assist           Walk 10 feet on uneven surface  activity   Assist           Wheelchair     Assist               Wheelchair 50 feet with 2 turns activity    Assist            Wheelchair 150 feet activity     Assist          Blood pressure 125/77, pulse 94, temperature (!) 97.5 F (36.4 C), temperature source Oral, resp.  rate 18, height 6' (1.829 m), weight 86.6 kg, SpO2 94%. Medical Problem List and Plan: 1. Functional deficits secondary to L MCA CVA             -patient may shower             -ELOS/Goals: 12-14 days S             -Continue CIR  2.  Antithrombotics: -DVT/anticoagulation:  Pharmaceutical: Lovenox             -antiplatelet therapy: aspirin, plavix, recommended DAPT for 90 days followed by Plavix 75mg  daily monotherapy 3. Thrombocytosis: continue DAPT 4. Ischemic cardiomyopathy: continue lopressor 5. Impaired cognition: This patient is not capable of making decisions on his own behalf. 6. HTN: continue lopressor -BP controlled, continue current regimen     11/20/2023    9:53 AM 11/20/2023    4:57 AM 11/20/2023    4:23 AM  Vitals with BMI  Weight  190 lbs 15 oz   BMI  25.89   Systolic 125  132  Diastolic 77  84  Pulse 94  84    7.CAD s/p stent to LAD in 2012: continue aspirin and plavix 8. Type 2 diabetes: continue ISS  -controlled CBG (last 3)  Recent Labs    11/19/23 2111 11/20/23 0555 11/20/23 1121  GLUCAP 104* 99 112*    9. Delirium: continue Seroquel HS 10. Overweight: provide dietary education 11. Apneic episodes: referred for outpatient sleep study, O2 ordered HS.  12. AKI  -Start IVF 50ml/hr, recheck tomorrow 13. Low Vitamin D  -Continue replacement  LOS: 1 days A  FACE TO FACE EVALUATION WAS PERFORMED  [name] [name] 11/20/2023, 11:04 AM

## 2023-11-20 NOTE — Progress Notes (Addendum)
 PMR Admission Coordinator Pre-Admission Assessment   Patient: [name] [name] is an 83 y.o., male MRN: [id number] DOB: August 30, 1940 Height: 6' (182.9 cm) Weight: 92.2 kg   Insurance Information HMO: yes     PPO: yes     PCP:      IPA:      80/20:      OTHER:  PRIMARY:[employer] MEDICARE HMO/PPO    Policy#: [id number], group: [id number] [place]      Subscriber: Pt CM Name:  Jyl Or      Phone#: [phone]     Fax#: [phone] Fax#: [phone] Received approval via phone on 4/11 for admit 6/44-0/34 Pre-Cert#: [id number]      Employer:  Benefits:  Phone #:      Name:  Venson Ginger Date: 08/09/2023- still active Deductible: does not have OOP Max: $4,150 ($365 met) CIR: $300/day co-pay with a max co-pay of $1,800/admission (6 days) SNF: $10/day co-pay for days 1-20, $214/day co-pay for days 21-100 Outpatient:  $10 copay/visit Home Health:  100% coverage DME: 80% coverage; 20% co-insurance Providers: in network  SECONDARY:       Policy#:      Phone#:      Artist:       Phone#:    The Data processing manager" for patients in Inpatient Rehabilitation Facilities with attached "Privacy Act Statement-Health Care Records" was provided and verbally reviewed with: Patient   Emergency Contact Information Contact Information       Name Relation Home Work Mobile    Blando,[name] L Son [phone]        Pearson,Sherry Daughter     [phone]         Other Contacts   None on File        Current Medical History  Patient Admitting Diagnosis: CVA                   History of Present Illness: [name] [name] is a 83 y.o. 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 On arrival to the ED, patient was hypertensive at 176/92 with heart rate of  89.  He was saturating at 96% on room air.  He was afebrile. Initial workup notable for WBC at 12.1, platelets 689, potassium 3.2, glucose 184, creatinine 0.89 with GFR above 60.  CT head with no acute infarct.  CTA head/neck with no LVO.  Neurology consulted. 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 Additional punctate subcentimeter acute ischemic nonhemorrhagic infarct was also present within the contralateral right basal ganglia. He was seen by PT/OT and they recommend CIR to assist return to PLOF.  Complete NIHSS TOTAL: 9   Patient's medical record from [facility] has been reviewed by the rehabilitation admission coordinator and physician.   Past Medical History      Past Medical History:  Diagnosis Date   Arthritis     Depression     Diabetes mellitus without complication (HCC)     Gout     Hyperlipidemia     Hypertension     Ischemic cardiomyopathy            Has the patient had major surgery during 100 days prior to admission? No   Family History   family history includes Diabetes  in his sister.   Current Medications  Current Medications    Current Facility-Administered Medications:    acetaminophen (TYLENOL) tablet 650 mg, 650 mg, Oral, Q4H PRN **OR** acetaminophen (TYLENOL) 160 MG/5ML solution 650 mg, 650 mg, Per Tube, Q4H PRN **OR** acetaminophen (TYLENOL) suppository 650 mg, 650 mg, Rectal, Q4H PRN, Avi Body, MD   [COMPLETED] aspirin EC tablet 325 mg, 325 mg, Oral, Once, 325 mg at 11/17/23 0134 **FOLLOWED BY** [START ON 11/18/2023] aspirin EC tablet 81 mg, 81 mg, Oral, Daily, Basaraba, Iulia, MD   clopidogrel (PLAVIX) tablet 75 mg, 75 mg, Oral, Daily, Althia Atlas, MD, 75 mg at 11/17/23 1133   enoxaparin (LOVENOX) injection 52.5 mg, 0.5 mg/kg (Order-Specific), Subcutaneous, Q24H, Madelynn Schilder, RPH, 52.5 mg at 11/16/23 2131   insulin aspart (novoLOG) injection 0-15 Units, 0-15 Units,  Subcutaneous, TID WC, Basaraba, Iulia, MD   ondansetron (ZOFRAN) injection 4 mg, 4 mg, Intravenous, Q6H PRN, Avi Body, MD   rosuvastatin (CRESTOR) tablet 20 mg, 20 mg, Oral, Daily, Basaraba, Iulia, MD, 20 mg at 11/16/23 2132   senna-docusate (Senokot-S) tablet 1 tablet, 1 tablet, Oral, QHS PRN, Avi Body, MD     Patients Current Diet:  Diet Order                  Diet heart healthy/carb modified Room service appropriate? Yes; Fluid consistency: Thin  Diet effective now                         Precautions / Restrictions Precautions Precautions: Fall Restrictions Weight Bearing Restrictions Per Provider Order: No    Has the patient had 2 or more falls or a fall with injury in the past year? No   Prior Activity Level Pt. Active in the community PTA, getting out daily.    Prior Functional Level Self Care: Did the patient need help bathing, dressing, using the toilet or eating? Independent   Indoor Mobility: Did the patient need assistance with walking from room to room (with or without device)? Independent   Stairs: Did the patient need assistance with internal or external stairs (with or without device)? Independent   Functional Cognition: Did the patient need help planning regular tasks such as shopping or remembering to take medications? Independent   Patient Information Are you of Hispanic, Latino/a,or Spanish origin?: A. No, not of Hispanic, Latino/a, or Spanish origin (proxy) What is your race?: A. White Do you need or want an interpreter to communicate with a doctor or health care staff?: 0. No   Patient's Response To:  Health Literacy and Transportation Is the patient able to respond to health literacy and transportation needs?: No Health Literacy - How often do you need to have someone help you when you read instructions, pamphlets, or other written material from your doctor or pharmacy?: Unable  In the past 12 months, has lack of transportation kept  you from medical appointments or from getting medications?: No (proxy) In the past 12 months, has lack of transportation kept you from meetings, work, or from getting things needed for daily living?: No   Journalist, newspaper / Equipment Home Equipment: Information systems manager, Rollator (4 wheels)   Prior Device Use: Indicate devices/aids used by the patient prior to current illness, exacerbation or injury? Walker   Current Functional Level Cognition   Arousal/Alertness: Awake/alert Overall Cognitive Status: Impaired/Different from baseline Orientation Level: Oriented to person, Disoriented to place, Disoriented to time, Disoriented to situation    Extremity  Assessment (includes Sensation/Coordination)   Upper Extremity Assessment: Defer to OT evaluation RUE Deficits / Details: weakness noted to RUE, full AROM  Lower Extremity Assessment: RLE deficits/detail RLE Deficits / Details: RLE weakness noted especially with hip flexion RLE Coordination: decreased gross motor     ADLs   Overall ADL's : Needs assistance/impaired Eating/Feeding: Supervision/ safety, Set up, Sitting Eating/Feeding Details (indicate cue type and reason): in recliner, placed splenda in coffee, took lids off items, raised table height d/t pt's height and weakness in RUE making self feeding more difficult Lower Body Dressing: Maximal assistance, Sitting/lateral leans Lower Body Dressing Details (indicate cue type and reason): to don socks Toilet Transfer: Minimal assistance, Moderate assistance, +2 for safety/equipment, +2 for physical assistance Toilet Transfer Details (indicate cue type and reason): simulated to recliner; max verb cueing and RW management for turn to recliner Functional mobility during ADLs: Minimal assistance, Rolling walker (2 wheels), +2 for safety/equipment     Mobility   Overal bed mobility: Needs Assistance Bed Mobility: Supine to Sit Supine to sit: Min assist, Mod assist, HOB elevated, Used  rails General bed mobility comments: NT patient received sitting EOB with OT present     Transfers   Overall transfer level: Needs assistance Equipment used: Rolling walker (2 wheels) Transfers: Sit to/from Stand Sit to Stand: Min assist, +2 safety/equipment, From elevated surface Bed to/from chair/wheelchair/BSC transfer type:: Step pivot Step pivot transfers: Mod assist, +2 physical assistance, +2 safety/equipment General transfer comment: increased cueing, time for processing/weight shifting anterior and laterally to progress feet during turn; RW management needed     Ambulation / Gait / Stairs / Wheelchair Mobility   Ambulation/Gait Ambulation/Gait assistance: Min assist, +2 safety/equipment Gait Distance (Feet): 6 Feet Assistive device: Rolling walker (2 wheels) Gait Pattern/deviations: Step-to pattern, Decreased step length - right, Decreased step length - left, Decreased stride length General Gait Details: Cues needed to increase step length B, R>L. Cues needed for upright posture Gait velocity: decreased     Posture / Balance Dynamic Sitting Balance Sitting balance - Comments: needed Mod to Min A for seated balance with cueing for anterior weight shift Balance Overall balance assessment: Needs assistance Sitting-balance support: Feet supported Sitting balance-Leahy Scale: Poor Sitting balance - Comments: needed Mod to Min A for seated balance with cueing for anterior weight shift Postural control: Posterior lean, Right lateral lean Standing balance support: Bilateral upper extremity supported, During functional activity, Reliant on assistive device for balance Standing balance-Leahy Scale: Fair Standing balance comment: +2 for safety     Special needs/care consideration Special service needs none    Previous Home Environment (from acute therapy documentation) Living Arrangements: Other relatives (lives with brother at baseline, son reports brother is not in great health  either)  Lives With: Alone Available Help at Discharge: Family, Available 24 hours/day Type of Home: House Home Layout: One level Home Access: Stairs to enter Entrance Stairs-Rails: Left, Right, Can reach both Entrance Stairs-Number of Steps: back entrance 5 STE; 3 STE front, if goes home with son 1 small step to enter Bathroom Shower/Tub: Associate Professor: Yes Home Care Services: No   Discharge Living Setting Plans for Discharge Living Setting: House Type of Home at Discharge: House Discharge Home Layout: Two level, Able to live on main level with bedroom/bathroom Alternate Level Stairs-Rails: Right Alternate Level Stairs-Number of Steps: flight Discharge Home Access: Stairs to enter Entrance Stairs-Rails: None Entrance Stairs-Number of Steps: 1 Discharge Bathroom Shower/Tub: Tub/shower unit Discharge Bathroom Toilet:  Standard Discharge Bathroom Accessibility: Yes How Accessible: Accessible via walker, Accessible via wheelchair Does the patient have any problems obtaining your medications?: No   Social/Family/Support Systems Patient Roles: Other (Comment) Contact Information: christopher (son) Anticipated Caregiver: works, wife is home all the time Anticipated Industrial/product designer Information: / Ability/Limitations of Caregiver: 24/7 Caregiver Availability: 24/7 Discharge Plan Discussed with Primary Caregiver: Yes Is Caregiver In Agreement with Plan?: Yes Does Caregiver/Family have Issues with Lodging/Transportation while Pt is in Rehab?: No   Goals Patient/Family Goal for Rehab: PT/OT/SLP Supervision Expected length of stay: 12-14 Pt/Family Agrees to Admission and willing to participate: Yes Program Orientation Provided & Reviewed with Pt/Caregiver Including Roles  & Responsibilities: Yes   Decrease burden of Care through IP rehab admission: not anticipated   Possible need for SNF placement upon discharge: not  anticipated   Patient Condition: I have reviewed medical records from [facility], spoken with CM, and patient and family member. I met with patient at the bedside for inpatient rehabilitation assessment.  Patient will benefit from ongoing PT, OT, and SLP, can actively participate in 3 hours of therapy a day 5 days of the week, and can make measurable gains during the admission.  Patient will also benefit from the coordinated team approach during an Inpatient Acute Rehabilitation admission.  The patient will receive intensive therapy as well as Rehabilitation physician, nursing, social worker, and care management interventions.  Due to safety, skin/wound care, disease management, medication administration, pain management, and patient education the patient requires 24 hour a day rehabilitation nursing.  The patient is currently min-mod A with mobility and basic ADLs.  Discharge setting and therapy post discharge at home with home health is anticipated.  Patient has agreed to participate in the Acute Inpatient Rehabilitation Program and will admit today.   Preadmission Screen Completed By:  Jeronimo Greaves, 11/17/2023 2:58 PM ______________________________________________________________________   Discussed status with Dr. Carlis Abbott  on 11/19/23 at 900 and received approval for admission today.   Admission Coordinator:  Jeronimo Greaves, CCC-SLP, time 928/Date 11/19/23    Assessment/Plan: Diagnosis: Left MCA CVA Does the need for close, 24 hr/day medical supervision in concert with the patient's rehab needs make it unreasonable for this patient to be served in a less intensive setting? Yes Co-Morbidities requiring supervision/potential complications:  1) Type 2 DM: monitor CBGs 2) HTN: monitor BO 3) HLD 4) obstructive CAD 5) Ischemic cardiomyopathy 6) chronic thrombocytosis: monitor PC Due to bladder management, bowel management, safety, skin/wound care, disease management, medication  administration, pain management, and patient education, does the patient require 24 hr/day rehab nursing? Yes Does the patient require coordinated care of a physician, rehab nurse, therapy disciplines of PT, OT to address physical and functional deficits in the context of the above medical diagnosis(es)? Yes Addressing deficits in the following areas: balance, endurance, locomotion, strength, transferring, bowel/bladder control, bathing, dressing, feeding, grooming, toileting, cognition, and psychosocial support Can the patient actively participate in an intensive therapy program of at least 3 hrs of therapy per day at least 5 days per week? Yes The potential for patient to make measurable gains while on inpatient rehab is excellent Anticipated functional outcomes upon discharge from inpatient rehab are supervision  with PT, supervision with OT, supervision with SLP. Estimated rehab length of stay to reach the above functional goals is: 10-14 days Anticipated discharge destination: Home Overall Rehab/Functional Prognosis: excellent     MD Signature: Sula Soda, MD

## 2023-11-20 NOTE — Plan of Care (Signed)
 Problem: RH Balance Goal: LTG: Patient will maintain dynamic sitting balance (OT) Description: LTG:  Patient will maintain dynamic sitting balance with assistance during activities of daily living (OT) Flowsheets (Taken 11/20/2023 1231) LTG: Pt will maintain dynamic sitting balance during ADLs with: Independent Goal: LTG Patient will maintain dynamic standing with ADLs (OT) Description: LTG:  Patient will maintain dynamic standing balance with assist during activities of daily living (OT)  Flowsheets (Taken 11/20/2023 1231) LTG: Pt will maintain dynamic standing balance during ADLs with: Supervision/Verbal cueing   Problem: Sit to Stand Goal: LTG:  Patient will perform sit to stand in prep for activites of daily living with assistance level (OT) Description: LTG:  Patient will perform sit to stand in prep for activites of daily living with assistance level (OT) Flowsheets (Taken 11/20/2023 1231) LTG: PT will perform sit to stand in prep for activites of daily living with assistance level: Supervision/Verbal cueing   Problem: RH Grooming Goal: LTG Patient will perform grooming w/assist,cues/equip (OT) Description: LTG: Patient will perform grooming with assist, with/without cues using equipment (OT) Flowsheets (Taken 11/20/2023 1231) LTG: Pt will perform grooming with assistance level of: Supervision/Verbal cueing   Problem: RH Bathing Goal: LTG Patient will bathe all body parts with assist levels (OT) Description: LTG: Patient will bathe all body parts with assist levels (OT) Flowsheets (Taken 11/20/2023 1231) LTG: Pt will perform bathing with assistance level/cueing: Minimal Assistance - Patient > 75%   Problem: RH Dressing Goal: LTG Patient will perform upper body dressing (OT) Description: LTG Patient will perform upper body dressing with assist, with/without cues (OT). Flowsheets (Taken 11/20/2023 1231) LTG: Pt will perform upper body dressing with assistance level of: Set up  assist Goal: LTG Patient will perform lower body dressing w/assist (OT) Description: LTG: Patient will perform lower body dressing with assist, with/without cues in positioning using equipment (OT) Flowsheets (Taken 11/20/2023 1231) LTG: Pt will perform lower body dressing with assistance level of: Minimal Assistance - Patient > 75%   Problem: RH Toileting Goal: LTG Patient will perform toileting task (3/3 steps) with assistance level (OT) Description: LTG: Patient will perform toileting task (3/3 steps) with assistance level (OT)  Flowsheets (Taken 11/20/2023 1231) LTG: Pt will perform toileting task (3/3 steps) with assistance level: Supervision/Verbal cueing   Problem: RH Toilet Transfers Goal: LTG Patient will perform toilet transfers w/assist (OT) Description: LTG: Patient will perform toilet transfers with assist, with/without cues using equipment (OT) Flowsheets (Taken 11/20/2023 1231) LTG: Pt will perform toilet transfers with assistance level of: Supervision/Verbal cueing   Problem: RH Tub/Shower Transfers Goal: LTG Patient will perform tub/shower transfers w/assist (OT) Description: LTG: Patient will perform tub/shower transfers with assist, with/without cues using equipment (OT) Flowsheets (Taken 11/20/2023 1231) LTG: Pt will perform tub/shower stall transfers with assistance level of: Minimal Assistance - Patient > 75%   Problem: RH Attention Goal: LTG Patient will demonstrate this level of attention during functional activites (OT) Description: LTG:  Patient will demonstrate this level of attention during functional activites  (OT) Flowsheets (Taken 11/20/2023 1231) Patient will demonstrate this level of attention during functional activites: Selective Patient will demonstrate above attention level in the following environment: Home LTG: Patient will demonstrate this level of attention during functional activites (OT): Minimal Assistance - Patient > 75%   Problem: RH  Awareness Goal: LTG: Patient will demonstrate awareness during functional activites type of (OT) Description: LTG: Patient will demonstrate awareness during functional activites type of (OT) Flowsheets (Taken 11/20/2023 1231) Patient will demonstrate awareness during functional  activites type of: Anticipatory LTG: Patient will demonstrate awareness during functional activites type of (OT): Minimal Assistance - Patient > 75%

## 2023-11-21 DIAGNOSIS — I63512 Cerebral infarction due to unspecified occlusion or stenosis of left middle cerebral artery: Secondary | ICD-10-CM | POA: Diagnosis not present

## 2023-11-21 LAB — BASIC METABOLIC PANEL WITH GFR
Anion gap: 8 (ref 5–15)
BUN: 28 mg/dL — ABNORMAL HIGH (ref 8–23)
CO2: 27 mmol/L (ref 22–32)
Calcium: 8.7 mg/dL — ABNORMAL LOW (ref 8.9–10.3)
Chloride: 107 mmol/L (ref 98–111)
Creatinine, Ser: 1.37 mg/dL — ABNORMAL HIGH (ref 0.61–1.24)
GFR, Estimated: 52 mL/min — ABNORMAL LOW (ref >60)
Glucose, Bld: 124 mg/dL — ABNORMAL HIGH (ref 70–99)
Potassium: 5.4 mmol/L — ABNORMAL HIGH (ref 3.5–5.1)
Sodium: 142 mmol/L (ref 135–145)

## 2023-11-21 LAB — GLUCOSE, CAPILLARY
Glucose-Capillary: 110 mg/dL — ABNORMAL HIGH (ref 70–99)
Glucose-Capillary: 128 mg/dL — ABNORMAL HIGH (ref 70–99)

## 2023-11-21 MED ORDER — SODIUM ZIRCONIUM CYCLOSILICATE 5 G PO PACK
5.0000 g | PACK | Freq: Once | ORAL | Status: AC
  Administered 2023-11-21: 5 g via ORAL
  Filled 2023-11-21: qty 1

## 2023-11-21 MED ORDER — VITAMIN D 25 MCG (1000 UNIT) PO TABS
1000.0000 [IU] | ORAL_TABLET | Freq: Every day | ORAL | Status: DC
  Administered 2023-11-21 – 2023-11-29 (×9): 1000 [IU] via ORAL
  Filled 2023-11-21 (×9): qty 1

## 2023-11-21 MED ORDER — ENSURE ENLIVE PO LIQD
237.0000 mL | Freq: Two times a day (BID) | ORAL | Status: DC
  Administered 2023-11-22: 237 mL via ORAL

## 2023-11-21 NOTE — Evaluation (Signed)
 Physical Therapy Assessment and Plan  Patient Details  Name: [name] [name] MRN: [id number] Date of Birth: Jun 05, 1941  PT Diagnosis: Difficulty walking, Hemiparesis dominant, Impaired cognition, and Muscle weakness Rehab Potential: Fair ELOS: 14-16 days   Today's Date: 11/20/2023 PT Individual Time: 4098-1191  PT Individual Time Calculation (min): 72 min  Hospital Problem: Principal Problem:   Acute ischemic left MCA stroke (HCC) Active Problems:   Arterial ischemic stroke, MCA, left, acute (HCC)   Past Medical History:  Past Medical History:  Diagnosis Date   Arthritis    Depression    Diabetes mellitus without complication (HCC)    Gout    Hyperlipidemia    Hypertension    Ischemic cardiomyopathy    Past Surgical History: History reviewed. No pertinent surgical history.  Assessment & Plan Clinical Impression: Patient is a 83 y.o. male who presented to the ED with stroke symptoms. 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 Additional punctate subcentimeter acute ischemic nonhemorrhagic infarct was also present within the contralateral right basal ganglia. 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 He is being admitted to CIR today with right sided weakness, dysarthria, and cognitive impairment.  Patient transferred to CIR on 11/19/2023 .   Patient currently requires mod/ max assist with mobility secondary to muscle weakness, decreased cardiorespiratoy endurance, impaired timing and sequencing, unbalanced muscle activation, motor apraxia, decreased coordination, and decreased motor planning, decreased attention to right and decreased motor planning, decreased attention, decreased problem solving, and decreased safety awareness, and decreased sitting balance, decreased standing balance, decreased postural control, decreased balance  strategies, and hemipareisis .  Prior to hospitalization, patient was modified independent  with mobility and lived with Family in a House home.  Home access is back entrance 5 STE; 3 STE front, if goes home with son 1 small step to enterStairs to enter.  Patient will benefit from skilled PT intervention to maximize safe functional mobility, minimize fall risk, and decrease caregiver burden for planned discharge home with 24 hour assist.  Anticipate patient will benefit from follow up [facility] at discharge.  PT - End of Session Activity Tolerance: Tolerates 30+ min activity with multiple rests Endurance Deficit: Yes PT Assessment Rehab Potential (ACUTE/IP ONLY): Fair PT Barriers to Discharge: Inaccessible home environment;Decreased caregiver support;Incontinence;Insurance for SNF coverage PT Patient demonstrates impairments in the following area(s): Balance;Edema;Endurance;Motor;Perception;Safety;Sensory PT Transfers Functional Problem(s): Bed Mobility;Bed to Chair;Car;Furniture PT Locomotion Functional Problem(s): Ambulation;Stairs PT Plan PT Intensity: Minimum of 1-2 x/day ,45 to 90 minutes PT Frequency: 5 out of 7 days PT Duration Estimated Length of Stay: 14-16 days PT Treatment/Interventions: Ambulation/gait training;Cognitive remediation/compensation;Discharge planning;DME/adaptive equipment instruction;Functional mobility training;Pain management;Psychosocial support;Splinting/orthotics;Therapeutic Activities;UE/LE Strength taining/ROM;Visual/perceptual remediation/compensation;Wheelchair propulsion/positioning;UE/LE Coordination activities;Therapeutic Exercise;Stair training;Skin care/wound management;Patient/family education;Neuromuscular re-education;Disease management/prevention;Community reintegration;Balance/vestibular training PT Transfers Anticipated Outcome(s): CGA/ MinA PT Locomotion Anticipated Outcome(s): CGA PT Recommendation Recommendations for Other Services: Therapeutic  Recreation consult Therapeutic Recreation Interventions: Stress management Follow Up Recommendations: Home health PT;Outpatient PT;24 hour supervision/assistance Patient destination: Home Equipment Recommended: To be determined   PT Evaluation Precautions/Restrictions Precautions Precautions: Fall Recall of Precautions/Restrictions: Impaired Precaution/Restrictions Comments: per chart, impulsive; on eval is lethargic/ medicated Restrictions Weight Bearing Restrictions Per Provider Order: No General   Vital SignsTherapy Vitals Temp: 98.5 F (36.9 C) Temp Source: Oral Pulse Rate: 77 Resp: 17 BP: (!) 152/78 Patient Position (if appropriate): Sitting Oxygen Therapy SpO2: 93 % O2 Device: Room Air  Pain Pain Assessment Pain Scale: 0-10 Pain Score: 0-No pain Pain Interference Pain Interference Pain Effect on Sleep: 0. Does not apply - I have not had any pain or hurting in the past 5 days Pain Interference with Therapy Activities: 0. Does not apply - I have not received rehabilitationtherapy in the past 5 days Pain Interference with Day-to-Day Activities: 1. Rarely or not at all Home Living/Prior Functioning Home Living Available Help at Discharge: Family;Available 24 hours/day Type of Home: House Home Access: Stairs to enter [employer] of Steps: back entrance 5 STE; 3 STE front, if goes home with son 1 small step to enter Entrance Stairs-Rails: Left;Right;Can reach both Home Layout: One level Bathroom Shower/Tub: [employer] operational [employer] Accessibility: Yes  Lives With: Family Prior Function Level of Independence: Independent with basic ADLs;Independent with homemaking with ambulation;Independent with transfers  Able to Take Stairs?: Yes Driving: Yes Vision/Perception  Vision - History Ability to See in Adequate Light: 0 Adequate Vision - Assessment Eye Alignment: Within Functional Limits Ocular Range of Motion: Within Functional  Limits Alignment/Gaze Preference: Within Defined Limits Tracking/Visual Pursuits: Able to track stimulus in all quads without difficulty Perception Perception: Impaired Perception-Other Comments: posterior bias, mild R inattention Praxis Praxis: WFL  Cognition Overall Cognitive Status: Impaired/Different from baseline Arousal/Alertness: Lethargic Sustained Attention: Appears intact Selective Attention: Appears intact Memory: Impaired Memory Impairment: Decreased recall of new information;Retrieval deficit Awareness: Impaired Problem Solving: Impaired Executive Function: Initiating Initiating: Impaired Behaviors: Impulsive Safety/Judgment: Impaired Sensation Sensation Light Touch: Impaired by gross assessment Hot/Cold: Appears Intact Proprioception: Impaired by gross assessment Coordination Gross Motor Movements are Fluid and Coordinated: No Fine Motor Movements are Fluid and Coordinated: No Coordination and Movement Description: slow to initiate and complete movements, weakness noted in performance Heel Shin Test: DNT Motor  Motor Motor: Abnormal postural alignment and control;Motor impersistence;Other (comment) (hemipareisis) Motor - Skilled Clinical Observations: general weakness - mild hemipareisis R>L in UE>LE   Trunk/Postural Assessment  Cervical Assessment Cervical Assessment: Exceptions to [facility] (forward head) Thoracic Assessment Thoracic Assessment: Exceptions to [facility] (rounded shoulders with mild kyphotic posture) Lumbar Assessment Lumbar Assessment: Exceptions to [facility] (post pelvic tilt) Postural Control Postural Control: Deficits on evaluation Trunk Control: posterior bias, difficulty with forward weight shift  Balance Balance Balance Assessed: Yes Static Sitting Balance Static Sitting - Balance Support: Right upper extremity supported;Left upper extremity supported;Feet supported Static Sitting - Level of Assistance: 4: Min assist Static Sitting - Comment/#  of Minutes: continuous, intermittent posterior bias/ LOB Dynamic Sitting Balance Dynamic Sitting - Balance Support: Right upper extremity supported;Left upper extremity supported;Feet supported Dynamic Sitting - Level of Assistance: 4: Min assist Sitting balance - Comments: occasional min assist at times for balance, post LOB's primarily Static Standing Balance Static Standing - Balance Support: Right upper extremity supported;Left upper extremity supported;During functional activity Static Standing - Level of Assistance: 3: Mod assist Dynamic Standing Balance Dynamic Standing - Balance Support: Bilateral upper extremity supported;During functional activity Dynamic Standing - Level of Assistance: 3: Mod assist Extremity Assessment      RLE Assessment RLE Assessment: Exceptions to [facility] RLE Strength RLE Overall Strength: Deficits Right Hip Flexion: 3-/5 Right Hip Extension: 3-/5 Right Hip ABduction: 4-/5 Right Hip ADduction: 3+/5 Right Knee Flexion: 3-/5 Right Knee Extension: 3-/5 Right Ankle Dorsiflexion: 3+/5 Right Ankle Plantar Flexion: 3+/5 LLE Assessment LLE Assessment: Exceptions to [facility] LLE Strength LLE Overall Strength: Deficits Left Hip Flexion: 3-/5 Left Hip Extension: 3+/5 Left Hip ABduction: 3+/5 Left Hip ADduction: 3-/5 Left Knee Flexion:  3/5 Left Knee Extension: 4-/5 Left Ankle Dorsiflexion: 3+/5 Left Ankle Plantar Flexion: 3+/5  Care Tool Care Tool Bed Mobility Roll left and right activity   Roll left and right assist level: Moderate Assistance - Patient 50 - 74%    Sit to lying activity   Sit to lying assist level: Moderate Assistance - Patient 50 - 74%    Lying to sitting on side of bed activity   Lying to sitting on side of bed assist level: the ability to move from lying on the back to sitting on the side of the bed with no back support.: Maximal Assistance - Patient 25 - 49%     Care Tool Transfers Sit to stand transfer   Sit to stand assist level:  Maximal Assistance - Patient 25 - 49%    Chair/bed transfer   Chair/bed transfer assist level: Maximal Assistance - Patient 25 - 49%    Car transfer          Care Tool Locomotion Ambulation   Assist level: Moderate Assistance - Patient 50 - 74% Assistive device: Walker-rolling Max distance: 12 ft  Walk 10 feet activity   Assist level: Moderate Assistance - Patient - 50 - 74% Assistive device: Walker-rolling   Walk 50 feet with 2 turns activity Walk 50 feet with 2 turns activity did not occur: Safety/medical concerns      Walk 150 feet activity Walk 150 feet activity did not occur: Safety/medical concerns      Walk 10 feet on uneven surfaces activity Walk 10 feet on uneven surfaces activity did not occur: Safety/medical concerns      Stairs Stair activity did not occur: Safety/medical concerns        Walk up/down 1 step activity Walk up/down 1 step or curb (drop down) activity did not occur: Safety/medical concerns      Walk up/down 4 steps activity Walk up/down 4 steps activity did not occur: Safety/medical concerns      Walk up/down 12 steps activity Walk up/down 12 steps activity did not occur: Safety/medical concerns      Pick up small objects from floor Pick up small object from the floor (from standing position) activity did not occur: Safety/medical concerns      Wheelchair Is the patient using a wheelchair?: Yes Type of Wheelchair: Manual   Wheelchair assist level: Dependent - Patient 0% Max wheelchair distance: 12 ft  Wheel 50 feet with 2 turns activity Wheelchair 50 feet with 2 turns activity did not occur: Safety/medical concerns    Wheel 150 feet activity Wheelchair 150 feet activity did not occur: Safety/medical concerns      Refer to Care Plan for Long Term Goals  SHORT TERM GOAL WEEK 1 PT Short Term Goal 1 (Week 1): Pt will perform bed mobility with overall MinA. PT Short Term Goal 2 (Week 1): Pt will improve sitting balance with increased  ability to produce forward lean with decreased vc. PT Short Term Goal 3 (Week 1): Pt will perform sit <>stand with MinA. PT Short Term Goal 4 (Week 1): Pt will perform stand pivot with MinA. PT Short Term Goal 5 (Week 1): Pt will ambulate at least 50 ft using LRAD with MinA.  Recommendations for other services: Adult nurse group and Stress management  Skilled Therapeutic Intervention Mobility Bed Mobility Bed Mobility: Rolling Left;Supine to Sit;Rolling Right;Sit to Supine Rolling Right: Moderate Assistance - Patient 50-74% Rolling Left: Moderate Assistance - Patient 50-74% Supine to Sit: Moderate Assistance -  Patient 50-74%;Maximal Assistance - Patient - Patient 25-49% Sit to Supine: Moderate Assistance - Patient 50-74% Transfers Transfers: Sit to Stand;Stand to Sit;Squat Pivot Transfers Sit to Stand: Maximal Assistance - Patient 25-49% Stand to Sit: Moderate Assistance - Patient 50-74% Squat Pivot Transfers: Maximal Assistance - Patient 25-49% Transfer (Assistive device): Rolling walker Locomotion  Gait Ambulation: Yes Gait Assistance: Moderate Assistance - Patient 50-74% Gait Distance (Feet): 12 Feet Assistive device: Rolling walker Gait Assistance Details: Tactile cues for posture;Tactile cues for weight shifting;Tactile cues for sequencing;Verbal cues for safe use of DME/AE;Verbal cues for gait pattern;Verbal cues for precautions/safety;Verbal cues for technique Gait Gait: Yes Gait Pattern: Impaired Gait Pattern: Decreased step length - left;Decreased stance time - left;Decreased stance time - right;Decreased step length - right;Decreased hip/knee flexion - right;Decreased hip/knee flexion - left;Poor foot clearance - right;Poor foot clearance - left Gait velocity: decreased Stairs / Additional Locomotion Stairs: No Wheelchair Mobility Wheelchair Mobility: No  Skilled Intervention: PT Evaluation completed; see above for results. PT educated patient in  roles of PT vs OT, PT POC, rehab potential, rehab goals, and discharge recommendations along with recommendation for follow-up rehabilitation services. Individual treatment initiated:  Patient supin in bed upon PT arrival. Patient alert and agreeable to PT session. Dtr feeding pt on arrival so PLOF and chart review initiated. Dtr answering questions as pt unable to initiate answers in timely manner. Attempted to allow pt to answer but dtr continues to provide info.   No pain complaint during session.  Pt very lethargic and slow to respond verbally or with mobility.   Therapeutic Activity: Bed Mobility: Patent performed supine <> sit with MaxA required d/t posterior bias and decreased strength to complete. Provided vc/ tc for technique throughout. Several LOB while seated on EOB d/t posterior bias and unable to self correct requiring maxA to correct. While seated is able to demonstrate good trunk control with cued perturbances and push/ pull instructions. However requires MaxA to maintain balance as well as to scoot forward or laterally.  Transfers: Patient performed sit <> stand transfers throughout session with MaxA. Requires elevated bed surface or pull on safety rails from toilet to complete. Provided vc/ tc for forward lean and increased effort.   Gait Training:  Patient ambulated 12 ft using RW with up to Mod A for balance d/t pt's posterior bias that increases with fatigue. Demonstrated variable step lengths but all step lengths are significantly decreased. Low step height with BLE dragging floor. Provided vc/tc for increasing step height/ length throughout but pt unable to self correct. Flexed posture noted with no correction from pt.   Neuromuscular Re-ed: NMR facilitated during session with focus on seated balance and standing balance. Pt guided in performance of anterior lean in order to decrease pt's tendency to bias his balance posteriorly. Blocked practice in forward lean  in order to  adequately perform lateral scoot. Requires continuous vc/ tc and MaxA to scoot. Standing balance challenged with attempt to statically stand using RW. Forward bias facilitatated with cueing and minA throughout challenge.     NMR performed for improvements in motor control and coordination, balance, sequencing, judgement, and self confidence/ efficacy in performing all aspects of mobility at highest level of independence.    Patient supine in bed and fatigued at end of session with brakes locked, bed alarm set, and all needs within reach. Dtr in room.    Discharge Criteria: Patient will be discharged from PT if patient refuses treatment 3 consecutive times without medical reason, if treatment  goals not met, if there is a change in medical status, if patient makes no progress towards goals or if patient is discharged from hospital.  The above assessment, treatment plan, treatment alternatives and goals were discussed and mutually agreed upon: by patient and by family  Donne Gage PT, DPT, CSRS 11/20/2023, 6:36 PM

## 2023-11-21 NOTE — Progress Notes (Signed)
 Physical Therapy Session Note  Patient Details  Name: [name] [name] MRN: [id number] Date of Birth: 1941/05/21  Today's Date: 11/21/2023 PT Individual Time: 4098-1191 PT Individual Time Calculation (min): 42 min   Short Term Goals: Week 1:  PT Short Term Goal 1 (Week 1): Pt will perform bed mobility with overall MinA. PT Short Term Goal 2 (Week 1): Pt will improve sitting balance with increased ability to produce forward lean with decreased vc. PT Short Term Goal 3 (Week 1): Pt will perform sit <>stand with MinA. PT Short Term Goal 4 (Week 1): Pt will perform stand pivot with MinA. PT Short Term Goal 5 (Week 1): Pt will ambulate at least 50 ft using LRAD with MinA.  Skilled Therapeutic Interventions/Progress Updates:  Received pt semi-reclined in bed. Pt reporting a "change in environment" and not sure where he was. Reoriented pt - pt able to recall his name, birthday, month, and year. Session with emphasis on functional mobility/transfers, dressing, generalized strengthening and endurance, and dynamic standing balance/coordination. Checked to ensure brief was clean and pt found to be dry but noted edema in scrotum - RN made aware and discussed scrotal sling with OT.   Donned pants and socks in supine with max A and pt able to pull pants up to upper thighs without assist; rolled L/R with supervision and required min A to pull remainder of way over hips. Pt transferred semi-reclined<>sitting L EOB with HOB slightly elevated and use of bedrails with heavy mod A for trunk control and mod multimodal cues for sequencing when rolling. Noted posterior bias in sitting requiring max A fading to CGA for static sitting balance. Attempted to don slip on shoes, however pt demonstrating x3 posterior LOB, requiring max A to correct - retrieved NT to assist with sitting balance while therapist donned shoes dependently.   MD arrived for morning rounds and pt required mod A and max multimodal cues for  sequencing to scoot hips to EOB. Stood from elevated EOB with RW and mod A and performed stand<>pivot into WC with RW and min A. Sat in WC at sink and washed face with supervision and increased time to initiate. Concluded session with pt sitting in WC, needs within reach, and seatbelt alarm on. Telesitter in place.  Therapy Documentation Precautions:  Precautions Precautions: Fall Recall of Precautions/Restrictions: Impaired Precaution/Restrictions Comments: per chart, impulsive; on eval is lethargic/ medicated Restrictions Weight Bearing Restrictions Per Provider Order: No   Therapy/Group: Individual Therapy [name] [name] PT, DPT 11/21/2023, 6:49 AM

## 2023-11-21 NOTE — Progress Notes (Addendum)
 Occupational Therapy Session Note  Patient Details  Name: [name] [name] MRN: [id number] Date of Birth: 07-22-1941  Today's Date: 11/21/2023 OT Individual Time: 1428-1530 OT Individual Time Calculation (min): 62 min  and Today's Date: 11/21/2023 OT Missed Time: 13 Minutes Missed Time Reason: Other (comment) (delay in OT service)   Short Term Goals: Week 1:  OT Short Term Goal 1 (Week 1): Patient will transition to standing from sitting with mod assist to aide in pulling up/down LB clothing OT Short Term Goal 2 (Week 1): Patient will don LB clothing with mod assist OT Short Term Goal 3 (Week 1): Patient will trasnfer to commode with mod assist OT Short Term Goal 4 (Week 1): Patient will correctly sequence steps of familiar functional activity - eg. wash before dress, socks before shoes without cueing.  Skilled Therapeutic Interventions/Progress Updates:  Skilled OT intervention completed with focus on functional transfers, dynamic standing balance, activity tolerance. Pt received semi supine in bed, agreeable to session. No pain reported. Pt with family present and was alert/engaged however with delayed processing and verbal responses consistently during session, requiring one step commands for all sequencing.  Daughter brought HOB up to 90 degrees stating pt needs elevated bed height, however education provided on prepping for DC and environmental conditions such as standard bed which daughter reports is what pt will be using unless hospital bed is warranted at later time. Elevated at 90 degrees, pt was able to use bed rail to power up with min A only. Initial light mod A for static sitting, however improved to close supervision with cues for anterior weight shifting and placing BLE on floor. Donned shoes dependently. From slightly elevated surface, pt stood with light mod fading to min A using RW with again cues for "nose over toes" and for pushing both feet through floor and hip extension.  Pt with retropulsion frequently during session requiring multimodal cues for correcting. Min A stand pivot with RW > w/c with poor clearance of BLE during stepping with cues for bigger steps.   Transported dependently in w/c <> gym. Cues needed for one hand on RW and other on w/c arm rest, as well as anterior weight shift, then pt able to stand with mod fading to min A. CGA stand pivot with cues for larger steps and for backwards stepping to EOM. Pt participated in the following dynamic standing balance and endurance tasks to promote independence and safety during BADLs and functional mobility: -placing (with R hand) and retrieving (with L hand) squigz from long mirror. Min/mod A sit > stand with RW and prior cues, and CGA/min A needed for balance due to retropulsion and dependency of back of knees compensating on mat for balance. Delay in motor processing noted on LUE. Extended rest needed between trials due to fatigue  Mod A sit > stand with RW then min A stand pivot > w/c with RW. Back in room, OT suggested to family to bring in fixadent, as pt with continued upper denture slippage with attempts to use thumb to hold in place which greatly challenged his already low voice projection. Pt remained seated in w/c, with belt alarm on/activated, family present and with all needs in reach at end of session.   Therapy Documentation Precautions:  Precautions Precautions: Fall Recall of Precautions/Restrictions: Impaired Precaution/Restrictions Comments: per chart, impulsive; on eval is lethargic/ medicated Restrictions Weight Bearing Restrictions Per Provider Order: No    Therapy/Group: Individual Therapy  Ruthanna Covert, MS, OTR/L  11/21/2023, 3:39 PM

## 2023-11-21 NOTE — Plan of Care (Signed)
  Problem: RH Balance Goal: LTG Patient will maintain dynamic sitting balance (PT) Description: LTG:  Patient will maintain dynamic sitting balance with assistance during mobility activities (PT) Flowsheets (Taken 11/21/2023 0658) LTG: Pt will maintain dynamic sitting balance during mobility activities with:: Supervision/Verbal cueing Goal: LTG Patient will maintain dynamic standing balance (PT) Description: LTG:  Patient will maintain dynamic standing balance with assistance during mobility activities (PT) Flowsheets (Taken 11/21/2023 0658) LTG: Pt will maintain dynamic standing balance during mobility activities with:: Contact Guard/Touching assist   Problem: Sit to Stand Goal: LTG:  Patient will perform sit to stand with assistance level (PT) Description: LTG:  Patient will perform sit to stand with assistance level (PT) Flowsheets (Taken 11/21/2023 0658) LTG: PT will perform sit to stand in preparation for functional mobility with assistance level: Contact Guard/Touching assist   Problem: RH Bed Mobility Goal: LTG Patient will perform bed mobility with assist (PT) Description: LTG: Patient will perform bed mobility with assistance, with/without cues (PT). Flowsheets (Taken 11/21/2023 0658) LTG: Pt will perform bed mobility with assistance level of: Contact Guard/Touching assist   Problem: RH Bed to Chair Transfers Goal: LTG Patient will perform bed/chair transfers w/assist (PT) Description: LTG: Patient will perform bed to chair transfers with assistance (PT). Flowsheets (Taken 11/21/2023 0658) LTG: Pt will perform Bed to Chair Transfers with assistance level: Contact Guard/Touching assist   Problem: RH Car Transfers Goal: LTG Patient will perform car transfers with assist (PT) Description: LTG: Patient will perform car transfers with assistance (PT). Flowsheets (Taken 11/21/2023 0658) LTG: Pt will perform car transfers with assist:: Minimal Assistance - Patient > 75%   Problem: RH  Furniture Transfers Goal: LTG Patient will perform furniture transfers w/assist (OT/PT) Description: LTG: Patient will perform furniture transfers  with assistance (OT/PT). Flowsheets (Taken 11/21/2023 0658) LTG: Pt will perform furniture transfers with assist:: Minimal Assistance - Patient > 75%   Problem: RH Ambulation Goal: LTG Patient will ambulate in controlled environment (PT) Description: LTG: Patient will ambulate in a controlled environment, # of feet with assistance (PT). Flowsheets (Taken 11/21/2023 [phone]) LTG: Pt will ambulate in controlled environ  assist needed:: Contact Guard/Touching assist LTG: Ambulation distance in controlled environment: more than 75 ft using LRAD Goal: LTG Patient will ambulate in home environment (PT) Description: LTG: Patient will ambulate in home environment, # of feet with assistance (PT). Flowsheets (Taken 11/21/2023 [phone]) LTG: Pt will ambulate in home environ  assist needed:: Contact Guard/Touching assist LTG: Ambulation distance in home environment: up to 50 ft per bout using LRAD   Problem: RH Stairs Goal: LTG Patient will ambulate up and down stairs w/assist (PT) Description: LTG: Patient will ambulate up and down # of stairs with assistance (PT) Flowsheets (Taken 11/21/2023 0658) LTG: Pt will ambulate up/down stairs assist needed:: Minimal Assistance - Patient > 75% LTG: Pt will  ambulate up and down number of stairs: at least 6 steps using HR setup as per home environment

## 2023-11-21 NOTE — Care Management (Signed)
 [facility] Individual Statement of Services  Patient Name:  [name] [name]  Date:  11/21/2023  Welcome to the [facility].  Our goal is to provide you with an individualized program based on your diagnosis and situation, designed to meet your specific needs.  With this comprehensive rehabilitation program, you will be expected to participate in at least 3 hours of rehabilitation therapies Monday-Friday, with modified therapy programming on the weekends.  Your rehabilitation program will include the following services:  Physical Therapy (PT), Occupational Therapy (OT), Speech Therapy (ST), 24 hour per day rehabilitation nursing, Therapeutic Recreaction (TR), Psychology, Neuropsychology, Care Coordinator, Rehabilitation Medicine, Nutrition Services, Pharmacy Services, and Other  Weekly team conferences will be held on Tuesdays to discuss your progress.  Your Inpatient Rehabilitation Care Coordinator will talk with you frequently to get your input and to update you on team discussions.  Team conferences with you and your family in attendance may also be held.  Expected length of stay: 14-16 days    Overall anticipated outcome: Contact Guard  Depending on your progress and recovery, your program may change. Your Inpatient Rehabilitation Care Coordinator will coordinate services and will keep you informed of any changes. Your Inpatient Rehabilitation Care Coordinator's name and contact numbers are listed  below.  The following services may also be recommended but are not provided by the [facility]:  Driving Evaluations Home Health Rehabiltiation Services Outpatient Rehabilitation Services Vocational Rehabilitation   Arrangements will be made to provide these services after discharge if needed.  Arrangements include referral to agencies that provide these services.  Your insurance has been verified to be:  [employer]  Your  primary doctor is:  Jerrlyn Morel  Pertinent information will be shared with your doctor and your insurance company.  Inpatient Rehabilitation Care Coordinator:  Kathey Pang [phone] or ([phone]  Information discussed with and copy given to patient by: [name] Case, 11/21/2023, 9:21 AM

## 2023-11-21 NOTE — Evaluation (Signed)
 Speech Language Pathology Assessment and Plan  Patient Details  Name: [name] [name] MRN: [id number] Date of Birth: [birth year]  SLP Diagnosis: Dysphagia  Rehab Potential: Good ELOS: 2 weeks   Today's Date: 11/21/2023 SLP Individual Time: 7829-5621 SLP Individual Time Calculation (min): 40 min   Hospital Problem: Principal Problem:   Acute ischemic left MCA stroke (HCC) Active Problems:   Arterial ischemic stroke, MCA, left, acute (HCC)  Past Medical History:  Past Medical History:  Diagnosis Date   Arthritis    Depression    Diabetes mellitus without complication (HCC)    Gout    Hyperlipidemia    Hypertension    Ischemic cardiomyopathy    Past Surgical History: History reviewed. No pertinent surgical history.  Assessment / Plan / Recommendation Clinical Impression   Pt is a 83 y.o. male who presented to the ED with stroke symptoms. 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 Additional punctate subcentimeter acute ischemic nonhemorrhagic infarct was also present within the contralateral right basal ganglia. 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 He is being admitted to CIR today with right sided weakness, dysarthria, and cognitive impairment.  Swallowing: Patient presents with a mild oral dysphagia. Oral mechanism exam completed revealing ill fitting upper dentures and adequate lower dentures. Notable cranial nerve XII deficits noted with difficulty protruding tongue, lateralizing, and elevating. Pt with weak volitional cough however able to illicit volitional swallow. Pt's dtr present and assisting with hx. Pt easily alerted but notable fatigue observed as pt frequently falling asleep during conversation. Bedside Swallow evaluation completed with regular solids and thin liquids. SLP assisted with feeding. Pt with mildly prolonged  but functional mastication of solids without observed lingual residue. He consumed thin liquids via straw sip without overt s/s aspiration. Discussion with pt and his dtr on severity of oral motor deficits and possible effects on swallow function including fatigue and risk of aspiration. Pt has great family support with dtr present at lunch meals and son present at dinner meals. They also demonstrate awareness as indicated by reporting avoidance of lettuce and broccoli. Family ultimately requesting for pt to remain on a regular diet and thin liquids with option to choose softer foods.  Due to family support and awareness SLP recommending continuation of regular diet and thin liquids along with SLP f/u in order to ensure tolerance of solids and liquids.   Pt would benefit from skilled SLP services to maximize swallowing function in order to maximize his independence prior to discharge. Anticipate pt will require supervision at home and f/u home health SLP services.     Skilled Therapeutic Interventions          BSE, informal assessment measures, and OME administered. Please see full report for additional details.     SLP Assessment  Patient will need skilled Speech Lanaguage Pathology Services during CIR admission    Recommendations  SLP Diet Recommendations: Thin;Age appropriate regular solids Liquid Administration via: Straw Medication Administration: Whole meds with puree Supervision: Staff to assist with self feeding;Full supervision/cueing for compensatory strategies Compensations: Slow rate;Small sips/bites;Lingual sweep for clearance of pocketing Postural Changes and/or Swallow Maneuvers: Upright 30-60 min after meal;Seated upright 90 degrees Oral Care Recommendations: Oral care BID Recommendations for Other Services: Neuropsych consult;Therapeutic Recreation consult Therapeutic Recreation Interventions: Pet therapy;Stress management Patient destination: Home Follow up Recommendations:  Outpatient SLP;Home Health SLP Equipment Recommended: To  be determined    SLP Frequency 3 to 5 out of 7 days   SLP Duration  SLP Intensity  SLP Treatment/Interventions 2 weeks  Minumum of 1-2 x/day, 30 to 90 minutes  Dysphagia/aspiration precaution training;Internal/external aids;Speech/Language facilitation;Therapeutic Activities;Oral motor exercises;Environmental controls;Cueing hierarchy;Functional tasks;Patient/family education;Multimodal communication approach;Therapeutic Exercise    Pain Pain Assessment Pain Scale: 0-10 Pain Score: 0-No pain    Care Tool Care Tool Cognition Ability to hear (with hearing aid or hearing appliances if normally used Ability to hear (with hearing aid or hearing appliances if normally used): 1. Minimal difficulty - difficulty in some environments (e.g. when person speaks softly or setting is noisy)   Expression of Ideas and Wants Expression of Ideas and Wants: 2. Frequent difficulty - frequently exhibits difficulty with expressing needs and ideas   Understanding Verbal and Non-Verbal Content Understanding Verbal and Non-Verbal Content: 4. Understands (complex and basic) - clear comprehension without cues or repetitions  Memory/Recall Ability Memory/Recall Ability : Current season;That he or she is in a hospital/hospital unit   Bedside Swallowing Assessment General History of Recent Intubation: No Behavior/Cognition: Cooperative;Lethargic/Drowsy;Pleasant mood Oral Cavity - Dentition: Dentures, top;Dentures, bottom (ill fitting upper dentures) Self-Feeding Abilities: Needs assist Patient Positioning: Upright in bed Baseline Vocal Quality: Low vocal intensity Volitional Cough: Weak Volitional Swallow: Able to elicit  Oral Care Assessment Oral Assessment  (WDL): Exceptions to WDL Lips: Asymmetrical Teeth: Loose (Comment);Dentures upper;Dentures lower Tongue: Pink;Moist Mucous Membrane(s): Pink;Moist Saliva: Moist, saliva free flowing Level  of Consciousness: Alert Is patient on any of following O2 devices?: None of the above Nutritional status: Dysphagia Oral Assessment Risk : High Risk Ice Chips Ice chips: Not tested Thin Liquid Thin Liquid: Within functional limits Nectar Thick Nectar Thick Liquid: Not tested Honey Thick Honey Thick Liquid: Not tested Puree Puree: Not tested Solid Solid: Impaired Oral Phase Functional Implications: Impaired mastication BSE Assessment Risk for Aspiration Impact on safety and function: Mild aspiration risk Other Related Risk Factors: Lethargy;Deconditioning  Short Term Goals: Week 1: SLP Short Term Goal 1 (Week 1): Pt will participate in a structured eval and/or diagnostic tx tasks to determine need for further dysphagia intervention SLP Short Term Goal 2 (Week 1): Pt will complete EMST w/ modA to facilitate increased breath support during speech production SLP Short Term Goal 3 (Week 1): Pt will recall compensatory speech strategies in 3/4 opportunities given minA SLP Short Term Goal 4 (Week 1): Pt will utilize compensatory speech strategies at phrase level to maintain 50% intelligibility or greater given modA SLP Short Term Goal 5 (Week 1): Pt will complete mildly specific word finding tasks w/ supervisionA SLP Short Term Goal 6 (Week 1): Pt will consume regular diet and thin liquids with minimal to no s/s aspiration provided sup A cues for safe swallow strategies as trained  Refer to Care Plan for Long Term Goals  Recommendations for other services: None   Discharge Criteria: Patient will be discharged from SLP if patient refuses treatment 3 consecutive times without medical reason, if treatment goals not met, if there is a change in medical status, if patient makes no progress towards goals or if patient is discharged from hospital.  The above assessment, treatment plan, treatment alternatives and goals were discussed and mutually agreed upon: by patient and by family  Renaee Munda 11/21/2023, 4:36 PM

## 2023-11-21 NOTE — Progress Notes (Signed)
 Speech Language Pathology Daily Session Note  Patient Details  Name: [name] [name] MRN: [id number] Date of Birth: 1941-05-05  Today's Date: 11/21/2023 SLP Individual Time: 1115-1200 SLP Individual Time Calculation (min): 45 min  Short Term Goals: Week 1: SLP Short Term Goal 1 (Week 1): Pt will participate in a structured eval and/or diagnostic tx tasks to determine need for further dysphagia intervention SLP Short Term Goal 2 (Week 1): Pt will complete EMST w/ modA to facilitate increased breath support during speech production SLP Short Term Goal 3 (Week 1): Pt will recall compensatory speech strategies in 3/4 opportunities given minA SLP Short Term Goal 4 (Week 1): Pt will utilize compensatory speech strategies at phrase level to maintain 50% intelligibility or greater given modA SLP Short Term Goal 5 (Week 1): Pt will complete mildly specific word finding tasks w/ supervisionA  Skilled Therapeutic Interventions: Skilled therapy session focused on communication goals. SLP facilitated session by evaluating lingual and respiratory strength. Patients average maximum expiratory pressure was a 38.8 cm H2O, with average for his age being 106 cm H20. Patients lingual strength was evaluated by the [place]  Oral Performance Instrument which indicated abnormalities in anterior and posterior lingual strength. Patient average anterior strength was a 33kpa while posterior was 25kpa. SLP educated patient and family member on these findings and began EMST (expiratory muscle strength training) set at 30cm H20. Patient left in chair with alarm set and call bell in reach. Continue POC.   Pain Pain Assessment Pain Scale: 0-10 Pain Score: 0-No pain  Therapy/Group: Individual Therapy  Keisa Blow M.A., CCC-SLP 11/21/2023, 7:43 AM

## 2023-11-21 NOTE — Progress Notes (Signed)
 PROGRESS NOTE   Subjective/Complaints: No new complaints this morning Says he remembers me Significant posterior lean Improved dorsiflexion of right foot  ROS: Denies CP, SOB, N/V, improved dorsiflexion of right foot   Objective:   No results found. Recent Labs    11/20/23 0529  WBC 8.2  HGB 16.2  HCT 47.6  PLT 485*   Recent Labs    11/20/23 0529 11/21/23 0537  NA 141 142  K 3.7 5.4*  CL 104 107  CO2 27 27  GLUCOSE 114* 124*  BUN 28* 28*  CREATININE 1.40* 1.37*  CALCIUM 8.8* 8.7*    Intake/Output Summary (Last 24 hours) at 11/21/2023 1222 Last data filed at 11/21/2023 0815 Gross per 24 hour  Intake 1103.01 ml  Output 289 ml  Net 814.01 ml        Physical Exam: Vital Signs Blood pressure (!) 167/83, pulse 82, temperature 98.5 F (36.9 C), temperature source Oral, resp. rate 17, height 6' (1.829 m), weight 88.8 kg, SpO2 97%.   Gen: no distress,  appears comfortable laying in bed  HEENT: oral mucosa pink and moist, NCAT Cardio: RRR Chest: CTAB, normal effort, normal rate of breathing Abd: soft, non-distended, +BS Ext: no edema Psych: pleasant, flat affect Skin: intact Neuro: Alert and oriented x3 (to month, note date), slow processing, oriented to person only Musculoskeletal: 3/5 strength on right side  Assessment/Plan: 1. Functional deficits which require 3+ hours per day of interdisciplinary therapy in a comprehensive inpatient rehab setting. Physiatrist is providing close team supervision and 24 hour management of active medical problems listed below. Physiatrist and rehab team continue to assess barriers to discharge/monitor patient progress toward functional and medical goals  Care Tool:  Bathing    Body parts bathed by patient: Right arm, Left arm, Chest, Abdomen, Front perineal area, Right upper leg, Left upper leg, Face   Body parts bathed by helper: Buttocks, Right lower leg      Bathing assist Assist Level: Moderate Assistance - Patient 50 - 74%     Upper Body Dressing/Undressing Upper body dressing        Upper body assist Assist Level: Minimal Assistance - Patient > 75%    Lower Body Dressing/Undressing Lower body dressing      What is the patient wearing?: Incontinence brief, Pants     Lower body assist Assist for lower body dressing: Maximal Assistance - Patient 25 - 49%     Toileting Toileting Toileting Activity did not occur (Clothing management and hygiene only): N/A (no void or bm)  Toileting assist       Transfers Chair/bed transfer  Transfers assist     Chair/bed transfer assist level: Minimal Assistance - Patient > 75%     Locomotion Ambulation   Ambulation assist      Assist level: Moderate Assistance - Patient 50 - 74% Assistive device: Walker-rolling Max distance: 12 ft   Walk 10 feet activity   Assist     Assist level: Moderate Assistance - Patient - 50 - 74% Assistive device: Walker-rolling   Walk 50 feet activity   Assist Walk 50 feet with 2 turns activity did not occur: Safety/medical concerns  Walk 150 feet activity   Assist Walk 150 feet activity did not occur: Safety/medical concerns         Walk 10 feet on uneven surface  activity   Assist Walk 10 feet on uneven surfaces activity did not occur: Safety/medical concerns         Wheelchair     Assist Is the patient using a wheelchair?: Yes Type of Wheelchair: Manual    Wheelchair assist level: Dependent - Patient 0% Max wheelchair distance: 12 ft    Wheelchair 50 feet with 2 turns activity    Assist    Wheelchair 50 feet with 2 turns activity did not occur: Safety/medical concerns       Wheelchair 150 feet activity     Assist  Wheelchair 150 feet activity did not occur: Safety/medical concerns       Blood pressure (!) 167/83, pulse 82, temperature 98.5 F (36.9 C), temperature source Oral, resp.  rate 17, height 6' (1.829 m), weight 88.8 kg, SpO2 97%. Medical Problem List and Plan: 1. Functional deficits secondary to L MCA CVA             -patient may shower             -ELOS/Goals: 12-14 days S             -Continue CIR  2.  Antithrombotics: -DVT/anticoagulation:  Pharmaceutical: Lovenox             -antiplatelet therapy: aspirin, plavix, recommended DAPT for 90 days followed by Plavix 75mg  daily monotherapy 3. Thrombocytosis: continue DAPT 4. Ischemic cardiomyopathy: continue lopressor 5. Impaired cognition: This patient is not capable of making decisions on his own behalf. 6. HTN: continue lopressor -BP controlled, continue current regimen     11/21/2023    8:23 AM 11/21/2023    4:27 AM 11/21/2023    4:16 AM  Vitals with BMI  Weight  195 lbs 12 oz   BMI  26.55   Systolic 167  152  Diastolic 83  78  Pulse 82  77    7.CAD s/p stent to LAD in 2012: continue aspirin and plavix   8. Prediabetes: d/c ISS CBG (last 3)  Recent Labs    11/20/23 2046 11/21/23 0606 11/21/23 1135  GLUCAP 138* 110* 128*    9. Delirium: resolved, d/c seroquel  10. Overweight: provide dietary education  11. Apneic episodes: referred for outpatient sleep study, O2 ordered HS.   12. AKI: Cr reviewed and has improved  13. Vitamin D insufficiency: started D3 1,000U daily  14. Hyperkalemia: lokelma ordered  LOS: 2 days A FACE TO FACE EVALUATION WAS PERFORMED  Lavell Portugal P Shaindy Reader 11/21/2023, 12:22 PM

## 2023-11-21 NOTE — Progress Notes (Signed)
 Patient ID: AZIM GILLINGHAM, male   DOB: [birth year], 83 y.o.   MRN: [id number]  [phone]- SW left message for pt son [name] to inform on ELOS. SW waiting on follow-up to confirm d/c plan .  Norval Been, MSW, LCSW Office: [phone] Cell: [phone] Fax: [phone]

## 2023-11-21 NOTE — Plan of Care (Signed)
 VSS, on room air. NS @ 50 cc/hr infusing as ordered. Telesitter in use.  Safety maintained. Bed alarm on. Call bell in reach. Will continue to monitor.     Problem: Consults Goal: RH STROKE PATIENT EDUCATION Description: See Patient Education module for education specifics  Outcome: Progressing Goal: Nutrition Consult-if indicated Outcome: Progressing Goal: Diabetes Guidelines if Diabetic/Glucose > 140 Description: If diabetic or lab glucose is > 140 mg/dl - Initiate Diabetes/Hyperglycemia Guidelines & Document Interventions  Outcome: Progressing   Problem: RH BOWEL ELIMINATION Goal: RH STG MANAGE BOWEL WITH ASSISTANCE Description: STG Manage Bowel with Mod I assist Assistance. Outcome: Progressing Goal: RH STG MANAGE BOWEL W/MEDICATION W/ASSISTANCE Description: STG Manage Bowel with Medication with mod I Assistance. Outcome: Progressing   Problem: RH BLADDER ELIMINATION Goal: RH STG MANAGE BLADDER WITH ASSISTANCE Description: STG Manage Bladder With min Assistance Outcome: Progressing   Problem: RH SAFETY Goal: RH STG ADHERE TO SAFETY PRECAUTIONS W/ASSISTANCE/DEVICE Description: STG Adhere to Safety Precautions With supervision Assistance/Device. Outcome: Progressing Goal: RH STG DECREASED RISK OF FALL WITH ASSISTANCE Description: STG Decreased Risk of Fall With min Assistance. Outcome: Progressing   Problem: RH COGNITION-NURSING Goal: RH STG USES MEMORY AIDS/STRATEGIES W/ASSIST TO PROBLEM SOLVE Description: STG Uses Memory Aids/Strategies With min Assistance to Problem Solve. Outcome: Progressing Goal: RH STG ANTICIPATES NEEDS/CALLS FOR ASSIST W/ASSIST/CUES Description: STG Anticipates Needs/Calls for Assist With min Assistance/Cues. Outcome: Progressing   Problem: RH KNOWLEDGE DEFICIT Goal: RH STG INCREASE KNOWLEDGE OF DIABETES Description: Patient and family will be able to manage DM using educational resources for medications and dietary modification  independently Outcome: Progressing Goal: RH STG INCREASE KNOWLEDGE OF HYPERTENSION Description: Patient and family will be able to manage HTN using educational resources for medications and dietary modification independently Outcome: Progressing Goal: RH STG INCREASE KNOWLEGDE OF HYPERLIPIDEMIA Description: Patient and family will be able to manage HLD using educational resources for medications and dietary modification independently Outcome: Progressing Goal: RH STG INCREASE KNOWLEDGE OF STROKE PROPHYLAXIS Description: Patient and family will be able to manage secondary risks using educational resources for medications and dietary modification independently Outcome: Progressing

## 2023-11-22 LAB — BASIC METABOLIC PANEL WITH GFR
Anion gap: 5 (ref 5–15)
BUN: 23 mg/dL (ref 8–23)
CO2: 23 mmol/L (ref 22–32)
Calcium: 8.3 mg/dL — ABNORMAL LOW (ref 8.9–10.3)
Chloride: 112 mmol/L — ABNORMAL HIGH (ref 98–111)
Creatinine, Ser: 1.1 mg/dL (ref 0.61–1.24)
GFR, Estimated: >60 mL/min (ref >60)
Glucose, Bld: 107 mg/dL — ABNORMAL HIGH (ref 70–99)
Potassium: 4.2 mmol/L (ref 3.5–5.1)
Sodium: 140 mmol/L (ref 135–145)

## 2023-11-22 MED ORDER — ENSURE ENLIVE PO LIQD: 237.0000 mL | Freq: Two times a day (BID) | ORAL | Status: DC

## 2023-11-22 MED ORDER — MAGNESIUM GLUCONATE 500 (27 MG) MG PO TABS
250.0000 mg | ORAL_TABLET | Freq: Every day | ORAL | Status: DC
  Administered 2023-11-22 – 2023-11-27 (×6): 250 mg via ORAL
  Filled 2023-11-22 (×6): qty 1

## 2023-11-22 NOTE — Progress Notes (Signed)
 PROGRESS NOTE   Subjective/Complaints: IPOC completed Team conference today Denies pain Tolerating therapy  ROS: Denies CP, SOB, N/V, improved dorsiflexion of right foot   Objective:   No results found. Recent Labs    11/20/23 0529  WBC 8.2  HGB 16.2  HCT 47.6  PLT 485*   Recent Labs    11/21/23 0537 11/22/23 0529  NA 142 140  K 5.4* 4.2  CL 107 112*  CO2 27 23  GLUCOSE 124* 107*  BUN 28* 23  CREATININE 1.37* 1.10  CALCIUM 8.7* 8.3*    Intake/Output Summary (Last 24 hours) at 11/22/2023 1052 Last data filed at 11/22/2023 1610 Gross per 24 hour  Intake 707.66 ml  Output --  Net 707.66 ml        Physical Exam: Vital Signs Blood pressure (!) 157/69, pulse 72, temperature 97.9 F (36.6 C), temperature source Oral, resp. rate 18, height 6' (1.829 m), weight 88.8 kg, SpO2 95%.   Gen: no distress,  appears comfortable laying in bed  HEENT: oral mucosa pink and moist, NCAT Cardio: RRR Chest: CTAB, normal effort, normal rate of breathing Abd: soft, non-distended, +BS Ext: no edema Psych: pleasant, flat affect Skin: intact Neuro: Alert and oriented x3 (to month, note date), slow processing, oriented to person only Musculoskeletal: 3/5 strength on right side Functional mobility: unable to ambulate today  Assessment/Plan: 1. Functional deficits which require 3+ hours per day of interdisciplinary therapy in a comprehensive inpatient rehab setting. Physiatrist is providing close team supervision and 24 hour management of active medical problems listed below. Physiatrist and rehab team continue to assess barriers to discharge/monitor patient progress toward functional and medical goals  Care Tool:  Bathing    Body parts bathed by patient: Right arm, Left arm, Chest, Abdomen, Front perineal area, Right upper leg, Left upper leg, Face   Body parts bathed by helper: Buttocks, Right lower leg     Bathing  assist Assist Level: Moderate Assistance - Patient 50 - 74%     Upper Body Dressing/Undressing Upper body dressing        Upper body assist Assist Level: Minimal Assistance - Patient > 75%    Lower Body Dressing/Undressing Lower body dressing      What is the patient wearing?: Incontinence brief, Pants     Lower body assist Assist for lower body dressing: Maximal Assistance - Patient 25 - 49%     Toileting Toileting Toileting Activity did not occur (Clothing management and hygiene only): N/A (no void or bm)  Toileting assist Assist for toileting: Moderate Assistance - Patient 50 - 74%     Transfers Chair/bed transfer  Transfers assist     Chair/bed transfer assist level: Minimal Assistance - Patient > 75%     Locomotion Ambulation   Ambulation assist      Assist level: Moderate Assistance - Patient 50 - 74% Assistive device: Walker-rolling Max distance: 12 ft   Walk 10 feet activity   Assist     Assist level: Moderate Assistance - Patient - 50 - 74% Assistive device: Walker-rolling   Walk 50 feet activity   Assist Walk 50 feet with 2 turns activity did not occur:  Safety/medical concerns         Walk 150 feet activity   Assist Walk 150 feet activity did not occur: Safety/medical concerns         Walk 10 feet on uneven surface  activity   Assist Walk 10 feet on uneven surfaces activity did not occur: Safety/medical concerns         Wheelchair     Assist Is the patient using a wheelchair?: Yes Type of Wheelchair: Manual    Wheelchair assist level: Dependent - Patient 0% Max wheelchair distance: 12 ft    Wheelchair 50 feet with 2 turns activity    Assist    Wheelchair 50 feet with 2 turns activity did not occur: Safety/medical concerns       Wheelchair 150 feet activity     Assist  Wheelchair 150 feet activity did not occur: Safety/medical concerns       Blood pressure (!) 157/69, pulse 72, temperature  97.9 F (36.6 C), temperature source Oral, resp. rate 18, height 6' (1.829 m), weight 88.8 kg, SpO2 95%. Medical Problem List and Plan: 1. 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  Worsening ability to ambulate: messaged therapy to see how he did in their session today -DVT/anticoagulation:  Pharmaceutical: Lovenox             -antiplatelet therapy: aspirin, plavix, recommended DAPT for 90 days followed by Plavix 75mg  daily monotherapy  3. Thrombocytosis: continue DAPT  4. Ischemic cardiomyopathy: continue lopressor  5. Impaired cognition: This patient is not capable of making decisions on his own behalf.  6. HTN: continue lopressor -BP controlled, continue current regimen, add magnesium gluconate 250mg  HS    11/22/2023    4:41 AM 11/21/2023    8:39 PM 11/21/2023    7:46 PM  Vitals with BMI  Systolic 157 136 295  Diastolic 69 74 74  Pulse 72 74 74    7.CAD s/[name] stent to LAD in 2012: continue aspirin and plavix   8. Prediabetes: d/c ISS CBG (last 3)  Recent Labs    11/20/23 2046 11/21/23 0606 11/21/23 1135  GLUCAP 138* 110* 128*    9. Delirium: resolved, d/c seroquel  10. Overweight: provide dietary education, d/c feeding supplement, vitamin D and potassium ordered  11. Apneic episodes: referred for outpatient sleep study, O2 ordered HS.   12. AKI: Cr reviewed and has improved/resolved  13. Vitamin D insufficiency: started D3 1,000U daily  14. Hyperkalemia: lokelma ordered, K+ reviewed and has resolved  15. Constipation: last BM 4/15, d/c senna-docusate  LOS: 3 days A FACE TO FACE EVALUATION WAS PERFORMED  [name] [name] [name] [name] 11/22/2023, 10:52 AM

## 2023-11-22 NOTE — Plan of Care (Signed)
 VSS, on room air. Using Stedy to bathroom. Safety maintained. Bed alarm on. Call bell in reach. Will continue to monitor.    Problem: Consults Goal: RH STROKE PATIENT EDUCATION Description: See Patient Education module for education specifics  Outcome: Progressing Goal: Nutrition Consult-if indicated Outcome: Progressing Goal: Diabetes Guidelines if Diabetic/Glucose > 140 Description: If diabetic or lab glucose is > 140 mg/dl - Initiate Diabetes/Hyperglycemia Guidelines & Document Interventions  Outcome: Progressing   Problem: RH BOWEL ELIMINATION Goal: RH STG MANAGE BOWEL WITH ASSISTANCE Description: STG Manage Bowel with Mod I assist Assistance. Outcome: Progressing Goal: RH STG MANAGE BOWEL W/MEDICATION W/ASSISTANCE Description: STG Manage Bowel with Medication with mod I Assistance. Outcome: Progressing   Problem: RH BLADDER ELIMINATION Goal: RH STG MANAGE BLADDER WITH ASSISTANCE Description: STG Manage Bladder With min Assistance Outcome: Progressing   Problem: RH SAFETY Goal: RH STG ADHERE TO SAFETY PRECAUTIONS W/ASSISTANCE/DEVICE Description: STG Adhere to Safety Precautions With supervision Assistance/Device. Outcome: Progressing Goal: RH STG DECREASED RISK OF FALL WITH ASSISTANCE Description: STG Decreased Risk of Fall With min Assistance. Outcome: Progressing   Problem: RH COGNITION-NURSING Goal: RH STG USES MEMORY AIDS/STRATEGIES W/ASSIST TO PROBLEM SOLVE Description: STG Uses Memory Aids/Strategies With min Assistance to Problem Solve. Outcome: Progressing Goal: RH STG ANTICIPATES NEEDS/CALLS FOR ASSIST W/ASSIST/CUES Description: STG Anticipates Needs/Calls for Assist With min Assistance/Cues. Outcome: Progressing   Problem: RH KNOWLEDGE DEFICIT Goal: RH STG INCREASE KNOWLEDGE OF DIABETES Description: Patient and family will be able to manage DM using educational resources for medications and dietary modification independently Outcome: Progressing Goal: RH  STG INCREASE KNOWLEDGE OF HYPERTENSION Description: Patient and family will be able to manage HTN using educational resources for medications and dietary modification independently Outcome: Progressing Goal: RH STG INCREASE KNOWLEGDE OF HYPERLIPIDEMIA Description: Patient and family will be able to manage HLD using educational resources for medications and dietary modification independently Outcome: Progressing Goal: RH STG INCREASE KNOWLEDGE OF STROKE PROPHYLAXIS Description: Patient and family will be able to manage secondary risks using educational resources for medications and dietary modification independently Outcome: Progressing

## 2023-11-22 NOTE — Progress Notes (Signed)
 Physical Therapy Session Note  Patient Details  Name: [name] [name] MRN: [id number] Date of Birth: March 27, 1941  Today's Date: 11/22/2023 PT Individual Time: 8295-6213 PT Individual Time Calculation (min): 25 min   Short Term Goals: Week 1:  PT Short Term Goal 1 (Week 1): Pt will perform bed mobility with overall MinA. PT Short Term Goal 2 (Week 1): Pt will improve sitting balance with increased ability to produce forward lean with decreased vc. PT Short Term Goal 3 (Week 1): Pt will perform sit <>stand with MinA. PT Short Term Goal 4 (Week 1): Pt will perform stand pivot with MinA. PT Short Term Goal 5 (Week 1): Pt will ambulate at least 50 ft using LRAD with MinA.  Skilled Therapeutic Interventions/Progress Updates:   Received pt semi-reclined in bed with NTs present to assist pt with toileting - PT took over with care. Pt agreeable to PT treatment and denied any pain during session. Session with emphasis on functional mobility/transfers, toileting, generalized strengthening and endurance, and dynamic standing balance. Pt transferred semi-reclined<>sitting L EOB with HOB elevated and use of bedrails with min A for trunk control and multimodal cues for sequencing. Pt required increased time and cues but able to scoot to EOB to get feet on floor without assist this morning. Stood from elevated EOB with RW and light mod A and performed stand<>pivot onto bedside commode with RW and min A (pt with posterior LOB requiring assist to correct). Pt required total A for clothing management. Removed soiled brief and pt continued to void. Donned clean brief with max A and stood from bedside commode with RW and min A and required max A to pull brief/pants over hips. Switched out bedside commode for [facility] and concluded session with pt sitting in WC, needs within reach, and seatbelt alarm on.  Therapy Documentation Precautions:  Precautions Precautions: Fall Recall of Precautions/Restrictions:  Impaired Precaution/Restrictions Comments: per chart, impulsive; on eval is lethargic/ medicated Restrictions Weight Bearing Restrictions Per Provider Order: No  Therapy/Group: Individual Therapy [name] [name] PT, DPT 11/22/2023, 6:49 AM

## 2023-11-22 NOTE — Patient Care Conference (Addendum)
 Inpatient RehabilitationTeam Conference and Plan of Care Update Date: 11/22/2023   Time: 11:33 AM    Patient Name: [name] [name]      Medical Record Number: [id number]  Date of Birth: 01-22-1941 Sex: Male         Room/Bed: 4W19C/4W19C-01 Payor Info: Payor: [employer] MEDICARE / Plan: [employer] MEDICARE HMO/PPO / Product Type: *No Product type* /    Admit Date/Time:  11/19/2023 11:09 AM  Primary Diagnosis:  Acute ischemic left MCA stroke [facility])  Hospital Problems: Principal Problem:   Acute ischemic left MCA stroke [facility]) Active Problems:   Arterial ischemic stroke, MCA, left, acute [facility])    Expected Discharge Date: Expected Discharge Date: 12/06/23  Team Members Present: Physician leading conference: Dr. Laverle Postin Social Worker Present: Adrianna Albee, LCSW Nurse Present: Forrestine Ike, RN PT Present: [name], PT OT Present: Tim Fontana, OT SLP Present: Dorla Gartner, SLP PPS Coordinator present : Jestine Moron, SLP     Current Status/Progress Goal Weekly Team Focus  Bowel/Bladder   continent of bowel and incontinent of bladder   maintains continent of bowel and bladder   assess qshift/prn    Swallow/Nutrition/ Hydration   regular/ thin educated family on choosing softer foods   supervision  tolerance regular/ thin, compensatory strategies    ADL's   Min A UB, Max A LB and toileting   Supervision functional mobility, min A ADLs   Barriers- retropulsion, dynamic standing balance, awareness, GM/FM coordination    Mobility   bed moiblity mod/max A, transfers with RW max A, gait 7ft with RW mod A   CGA overall  barriers: very slow processing, generalized weakness/deconditioning, decreased balance/coordination, and fatigue    Communication   ~60% intelligibile at the word level   minA   education, IOPI, EMST    Safety/Cognition/ Behavioral Observations               Pain   denies pain   remains pain free   assess qshift and prn    Skin   skin  intact   maintain skin integrity  assess qshift and prn      Discharge Planning:  D/c to son's home who will provide support. Pt stepdtr [name] to provide primary support during the day while son is at work. SW will confirm there are no barriers to discharge.   Team Discussion: Patient post Left MCA CVA with right sided weakness, balance deficits, delayed processing, and easily fatigues.  Patient on target to meet rehab goals: yes, currently needs mod assist for transfers, and able to ambulate up to 74' with mod assist.  Needs min - max assist for ADLs and toileting.  Goals for discharge set for supervision - CGA overall.  *See Care Plan and progress notes for long and short-term goals.   Revisions to Treatment Plan:  IV fluid bolus/enc po fluids IOPI EMST   Teaching Needs: Safety, medications, dietary modifications, transfers, toileting, etc.   Current Barriers to Discharge: Decreased caregiver support  Possible Resolutions to Barriers: Family education     Medical Summary Current Status: CVA, overweight, HTN  Barriers to Discharge: Medical stability  Barriers to Discharge Comments: CVA, overweight, HTN Possible Resolutions to [employer] Focus: continue aspirin and plavix, d/c feeding supplement, magnesium supplement added, continue lopressor   Continued Need for Acute Rehabilitation Level of Care: The patient requires daily medical management by a physician with specialized training in physical medicine and rehabilitation for the following reasons: Direction of a multidisciplinary physical rehabilitation program  to maximize functional independence : Yes Medical management of patient stability for increased activity during participation in an intensive rehabilitation regime.: Yes Analysis of laboratory values and/or radiology reports with any subsequent need for medication adjustment and/or medical intervention. : Yes   I attest that I was present, lead the team  conference, and concur with the assessment and plan of the team.   Forrestine Ike B 11/22/2023, 3:51 PM

## 2023-11-22 NOTE — Progress Notes (Signed)
 Speech Language Pathology Daily Session Note  Patient Details  Name: RAGE BEEVER MRN: [id number] Date of Birth: 07-07-1941  Today's Date: 11/22/2023 SLP Individual Time: 1450-1530 SLP Individual Time Calculation (min): 40 min  Short Term Goals: Week 1: SLP Short Term Goal 1 (Week 1): Pt will participate in a structured eval and/or diagnostic tx tasks to determine need for further dysphagia intervention SLP Short Term Goal 2 (Week 1): Pt will complete EMST w/ modA to facilitate increased breath support during speech production SLP Short Term Goal 3 (Week 1): Pt will recall compensatory speech strategies in 3/4 opportunities given minA SLP Short Term Goal 4 (Week 1): Pt will utilize compensatory speech strategies at phrase level to maintain 50% intelligibility or greater given modA SLP Short Term Goal 5 (Week 1): Pt will complete mildly specific word finding tasks w/ supervisionA SLP Short Term Goal 6 (Week 1): Pt will consume regular diet and thin liquids with minimal to no s/s aspiration provided sup A cues for safe swallow strategies as trained  Skilled Therapeutic Interventions: SLP conducted skilled therapy session targeting swallowing and communication goals. Patient greeted with daughter and son-in-law at bedside. With SLP facilitation, patient completed EMST x5 sets of 5 at [facility] to promote improved breath support for speech. Patient encouraged to utilize accessory breathing muscles to improve breath support during speech tasks, as patient remains ~60% intelligible at all utterance lengths. When asked to produce voicing at maximum level of effort, he increased volume marginally, though looked exhausted by effort. In remaining minutes of session, assessed diet tolerance of regular/thin liquid diet with patient tolerating without any overt difficulty. Patient was left in room with call bell in reach and alarm set. SLP will continue to target goals per plan of care.        Pain Pain  Assessment Pain Scale: 0-10 Pain Score: 0-No pain  Therapy/Group: Individual Therapy  Courteny Egler, M.A., CCC-SLP  Tarik Teixeira A Kensey Luepke 11/22/2023, 3:32 PM

## 2023-11-22 NOTE — IPOC Note (Signed)
 Overall Plan of Care [facility]) Patient Details Name: [name] [name] MRN: [id number] DOB: Jun 13, 1941  Admitting Diagnosis: Acute ischemic left MCA stroke [facility])  Hospital Problems: Principal Problem:   Acute ischemic left MCA stroke (HCC) Active Problems:   Arterial ischemic stroke, MCA, left, acute (HCC)     Functional Problem List: Nursing Behavior, Bladder, Bowel, Endurance, Medication Management, Safety  PT Balance, Edema, Endurance, Motor, Perception, Safety, Sensory  OT Balance, Motor, Sensory, Behavior, Cognition, Perception, Endurance  SLP Sensory, Endurance, Motor, Linguistic  TR         Basic ADL's: OT Bathing, Dressing, Toileting     Advanced  ADL's: OT       Transfers: PT Bed Mobility, Bed to Chair, Car, Occupational psychologist, Research scientist (life sciences): PT Ambulation, Stairs     Additional Impairments: OT None  SLP Swallowing expression    TR      Anticipated Outcomes Item Anticipated Outcome  Self Feeding supervision  Swallowing  sup A   Basic self-care  min assist  Toileting  contact guard   Bathroom Transfers contact guard  Bowel/Bladder  regain continence of bowel and bladder  Transfers  CGA/ MinA  Locomotion  CGA  Communication  minA  Cognition     Pain  <3  Safety/Judgment  mod assist   Therapy Plan: PT Intensity: Minimum of 1-2 x/day ,45 to 90 minutes PT Frequency: 5 out of 7 days PT Duration Estimated Length of Stay: 14-16 days OT Intensity: Minimum of 1-2 x/day, 45 to 90 minutes OT Frequency: 5 out of 7 days OT Duration/Estimated Length of Stay: 14-16 days SLP Intensity: Minumum of 1-2 x/day, 30 to 90 minutes SLP Frequency: 3 to 5 out of 7 days SLP Duration/Estimated Length of Stay: 2 weeks   Team Interventions: Nursing Interventions Patient/Family Education, Bladder Management, Bowel Management, Disease Management/Prevention, Medication Management, Cognitive Remediation/Compensation, Discharge Planning, Psychosocial  Support, Dysphagia/Aspiration Precaution Training  PT interventions Ambulation/gait training, Cognitive remediation/compensation, Discharge planning, DME/adaptive equipment instruction, Functional mobility training, Pain management, Psychosocial support, Splinting/orthotics, Therapeutic Activities, UE/LE Strength taining/ROM, Visual/perceptual remediation/compensation, Wheelchair propulsion/positioning, UE/LE Coordination activities, Therapeutic Exercise, Stair training, Skin care/wound management, Patient/family education, Neuromuscular re-education, Disease management/prevention, Firefighter, Warden/ranger  OT Interventions Warden/ranger, Disease mangement/prevention, Neuromuscular re-education, Patient/family education, Self Care/advanced ADL retraining, Therapeutic Exercise, UE/LE Coordination activities, Wheelchair propulsion/positioning, UE/LE Strength taining/ROM, Visual/perceptual remediation/compensation, Therapeutic Activities, Functional mobility training, DME/adaptive equipment instruction, Discharge planning, Cognitive remediation/compensation  SLP Interventions Dysphagia/aspiration precaution training, Internal/external aids, Speech/Language facilitation, Therapeutic Activities, Oral motor exercises, Environmental controls, Cueing hierarchy, Functional tasks, Patient/family education, Multimodal communication approach, Therapeutic Exercise  TR Interventions    SW/CM Interventions Discharge Planning, Psychosocial Support, Patient/Family Education   Barriers to Discharge MD  Medical stability  Nursing Decreased caregiver support, Home environment access/layout, Incontinence, Lack of/limited family support, Medication compliance, Behavior 2 level 1 ste no rails w son  PT Inaccessible home environment, Decreased caregiver support, Incontinence, Insurance for SNF coverage    OT      SLP Nutrition means    SW Decreased caregiver support, Lack  of/limited family support, Community education officer for SNF coverage     Team Discharge Planning: Destination: PT-Home ,OT- Home , SLP-Home Projected Follow-up: PT-Home health PT, Outpatient PT, 24 hour supervision/assistance, OT-  Home health OT, Outpatient OT, SLP-Outpatient SLP, Home Health SLP Projected Equipment Needs: PT-To be determined, OT- To be determined, SLP-To be determined Equipment Details: PT- , OT-  Patient/family involved in discharge planning: PT- Patient, Family member/caregiver,  OT-Patient, SLP-Patient, Family member/caregiver  MD ELOS: 12-14 days Medical Rehab Prognosis:  Excellent Assessment: The patient has been admitted for CIR therapies with the diagnosis of acute ischemic left MCA CVA. The team will be addressing functional mobility, strength, stamina, balance, safety, adaptive techniques and equipment, self-care, bowel and bladder mgt, patient and caregiver education. Goals have been set at supervision. Anticipated discharge destination is home.        See Team Conference Notes for weekly updates to the plan of care

## 2023-11-22 NOTE — Progress Notes (Signed)
 Inpatient Rehabilitation Care Coordinator Assessment and Plan Patient Details  Name: [name] [name] MRN: [id number] Date of Birth: 1941/03/14  Today's Date: 11/22/2023  Hospital Problems: Principal Problem:   Acute ischemic left MCA stroke [facility]) Active Problems:   Arterial ischemic stroke, MCA, left, acute [facility])  Past Medical History:  Past Medical History:  Diagnosis Date   Arthritis    Depression    Diabetes mellitus without complication (HCC)    Gout    Hyperlipidemia    Hypertension    Ischemic cardiomyopathy    Past Surgical History: History reviewed. No pertinent surgical history. Social History:  reports that he quit smoking about 34 years ago. His smoking use included cigarettes. He has never used smokeless tobacco. He reports current alcohol use of about 3.0 standard drinks of alcohol per week. He reports that he does not use drugs.  Family / Support Systems Marital Status: Widow/Widower How Long?: 10 years Spouse/Significant Other: Widowed Children: Blended family; step dtr IT sales professional, and son [name] [name] Other Supports: step dtr [name] [name] Anticipated Caregiver: son and dtr Ability/Limitations of Caregiver: Pt son works during the day, so pt step dtr will provide assistance. she is unable to do any heavy lifting due to back issues. Caregiver Availability: 24/7 Family Dynamics: Pt was living alone PTA  Social History Preferred language: English Religion: None Cultural Background: Pt worked i Veterinary surgeon until retirement Education: high school grad Primary school teacher - How often do you need to have someone help you when you read instructions, pamphlets, or other written material from your doctor or pharmacy?: Patient unable to respond Writes: Yes Employment Status: Retired Date Retired/Disabled/Unemployed: 2012/2013 Legal History/Current Legal Issues: Pt stp dtr denies Guardian/Conservator: Pt step dtr denies   Abuse/Neglect Abuse/Neglect Assessment Can Be  Completed: Unable to assess, patient is non-responsive or altered mental status Physical Abuse: Denies Verbal Abuse: Denies Sexual Abuse: Denies Exploitation of patient/patient's resources: Denies Self-Neglect: Denies  Patient response to: Social Isolation - How often do you feel lonely or isolated from those around you?: Patient unable to respond  Emotional Status Pt's affect, behavior and adjustment status: Pt unable to answer SW questions due to aphasia. Pt step dtr answered questions. Recent Psychosocial Issues: Denies Psychiatric History: Denies Substance Abuse History: Pt quit smoking cigarettes 20 years ago; rare etoh use; no rec drug use.  Patient / Family Perceptions, Expectations & Goals Pt/Family understanding of illness & functional limitations: Pt family has a general understanding of pt care needs Premorbid pt/family roles/activities: Independent Anticipated changes in roles/activities/participation: Assistance with ADLs/IADLs; son does yardwork Pt/family expectations/goals: pt family would like for him ot resume a normal way of life  Building surveyor: None Premorbid Home Care/DME Agencies: None Transportation available at discharge: TBD Is the patient able to respond to transportation needs?: Yes In the past 12 months, has lack of transportation kept you from medical appointments or from getting medications?: No In the past 12 months, has lack of transportation kept you from meetings, work, or from getting things needed for daily living?: No Resource referrals recommended: Neuropsychology  Discharge Planning Living Arrangements: Alone, Children Support Systems: Spouse/significant other, Children Type of Residence: Private residence Insurance Resources: Media planner (specify) Administrator Medicare) Financial Resources: Restaurant manager, fast food Screen Referred: No Living Expenses: Banker Management: Patient Does the patient have any  problems obtaining your medications?: No Home Management: Pt managed all home care needs Patient/Family Preliminary Plans: TBD Care Coordinator Barriers to Discharge: Decreased caregiver support, Lack of/limited family support, Insurance for SNF  coverage Care Coordinator Anticipated Follow Up Needs: HH/OP  Clinical Impression SW met with pt with step dtr [name] [name] present. Pt is not a Cytogeneticist. No HCPOA. DME- cane, rollator, lift chair, shower chair, and long hand showerhead.   [name] [name] Case 11/22/2023, 9:37 AM

## 2023-11-22 NOTE — Progress Notes (Signed)
 Occupational Therapy Session Note  Patient Details  Name: [name] [name] MRN: [id number] Date of Birth: [birth year]  Today's Date: 11/22/2023 OT Individual Time: 1035-1130 & 4010-2725 OT Individual Time Calculation (min): 55 min & 70 min   Short Term Goals: Week 1:  OT Short Term Goal 1 (Week 1): Patient will transition to standing from sitting with mod assist to aide in pulling up/down LB clothing OT Short Term Goal 2 (Week 1): Patient will don LB clothing with mod assist OT Short Term Goal 3 (Week 1): Patient will trasnfer to commode with mod assist OT Short Term Goal 4 (Week 1): Patient will correctly sequence steps of familiar functional activity - eg. wash before dress, socks before shoes without cueing.  Skilled Therapeutic Interventions/Progress Updates:  Session 1 Skilled OT intervention completed with focus on activity tolerance, dynamic standing balance, anterior weight shifting, cardiovascular endurance. Pt received seated in w/c attempting to wheel self out of room, indicating his room was too quiet and lonely. Pt agreeable to session. No pain reported.  Transported dependently in w/c <> gym. Cues needed to anterior weight shift, as well as R hand on arm rest of w/c and other on RW, then able to stand with heavy min A. CGA stand pivot with RW > EOM.   Pt was able to sit statically with supervision without LOB posteriorly. Had pt reach forward to retrieve horse shoe on box on floor to promote anterior weight shifting, then rack horse shoe on RW, sequence hand placement of one hand RW/mat, then stand with CGA/min A using RW and rack on top of long mirror. Completed total of 10 sit > stands. Pt required mod verbal cues for anterior weight shifting and sequencing initially however did fade to supervision for sequencing with repetition. Able to take horse shoes down with LUE without difficulty however intermittent seated rest breaks needed for fatigue.   CGA sit > stand and stand  pivot with RW > w/c. Back in room, pt remained seated in w/c, with family present and with all needs in reach at end of session.  Session 2 Skilled OT intervention completed with focus on ADL retraining, functional endurance, and mobility within a shower context. Pt received seated in w/c, agreeable to session. No pain reported.  Pt completed sit > stands in stedy with supervision, sit > stands using RW with CGA during session. Min/mod cues needed for anterior weight shifting, hand placement and sequencing.  Transported dependently in w/c > shower. Pt was able to bathe all parts with min A for feet and buttocks only, remaining seated for entirety of shower. Required CGA for sitting balance while leaning anteriorly for access of BLE. Would benefit from use of long handled sponge. Cues needed for thoroughness of all parts. In stedy at mirror for midline awareness and postural endurance, pt was able to donn shirt/deo with supervision. Seated in w/c, pt was able to thread LB clothing with min A for RLE, then stood for donning over hips with min A. Dependent assist needed for socks and shoes. Combed hair with set up A.  Daughter present who has been communicated to a supervision/min A goal level by DC. OT discussed assist needed for bathing, which daughter reports son will assist with showers. OT then suggested that full supervision goals includes toileting which can't wait for son to be present just at night. Daughter reports she does not feel comfortable supervising pt even in bathroom which OT anticipates being needed. Discussed hired assist cost burden  and high demand but to start the conversation with pt's son who he DC with to discuss plans. Daughter reports pt's 12 yo grandson could also supervise and we discussed education training for supervision needs in the coming days. CSW aware of need.  Pt remained seated in w/c, with family present and with all needs in reach at end of session.   Therapy  Documentation Precautions:  Precautions Precautions: Fall Recall of Precautions/Restrictions: Impaired Precaution/Restrictions Comments: per chart, impulsive; on eval is lethargic/ medicated Restrictions Weight Bearing Restrictions Per Provider Order: No    Therapy/Group: Individual Therapy  Ruthanna Covert, MS, OTR/L  11/22/2023, 2:35 PM

## 2023-11-23 DIAGNOSIS — I63512 Cerebral infarction due to unspecified occlusion or stenosis of left middle cerebral artery: Secondary | ICD-10-CM | POA: Diagnosis not present

## 2023-11-23 MED ORDER — MAGNESIUM HYDROXIDE 400 MG/5ML PO SUSP
15.0000 mL | Freq: Once | ORAL | Status: AC
  Administered 2023-11-23: 15 mL via ORAL
  Filled 2023-11-23: qty 30

## 2023-11-23 MED ORDER — ENOXAPARIN SODIUM 40 MG/0.4ML IJ SOSY
40.0000 mg | PREFILLED_SYRINGE | INTRAMUSCULAR | Status: DC
  Administered 2023-11-23 – 2023-11-28 (×6): 40 mg via SUBCUTANEOUS
  Filled 2023-11-23 (×6): qty 0.4

## 2023-11-23 NOTE — Group Note (Signed)
 Patient Details Name: TAYO MAUTE MRN: [id number] DOB: [birth year] Today's Date: 11/23/2023  Time Calculation: OT Group Time Calculation OT Group Start Time: 1000 OT Group Stop Time: 1100 OT Group Time Calculation (min): 60 min      Group Description: BUE Therex Group: Pt participated in group session with a focus on BUE strength and endurance to facilitate improved activity tolerance and strength for higher level BADLs and functional mobility tasks.    Individual level documentation: Pt first in engaged in ball tosses around the room to other group members to facilitate improved BUE coordination, strength and cardiovascular endurance for ~ 6 mins as a light warm up.   Pt engaged in seated therapeutic activity game where pts were instructed to roll large dice to see what number they rolled, once a number was determined each number correlated to an UB exercise and a number of reps. Exercises included bicep curls, upright rows, flys, chest presses and punches. Repetitions ranged from 10-20. Pt chose to use 1 lb weights during session. Eduction provided during activity of various modifications for all exercises. Education provided on the importance of deep breathing as well as determining each pts activity tolerance.   Issued all group participants a theraband HEP on the following therex to facilitate improved UB strength and endurance for higher level functional mobility tasks. Therex included:  X5 shoulder flexion  X5 bicep curls X5 shoulder horizontal ABD X5 shoulder diagonal pulls X5 shoulder extension    Issued pt written HEP to increase carryover, educated all patients on doing therex to their tolerance.    Ended session with guide deep breathing for 3 mins in conjunction with light stretches. Pt returned to room by RT.  Pain: 7/10 pain reported in L shoulder, rest breaks provided as needed with pt opting to do therex with RUE only for pain mgmt.    Mollie Anger  [facility] 11/23/2023, 12:14 PM

## 2023-11-23 NOTE — Plan of Care (Signed)
  Problem: Consults Goal: RH STROKE PATIENT EDUCATION Description: See Patient Education module for education specifics  Outcome: Progressing Goal: Nutrition Consult-if indicated Outcome: Progressing Goal: Diabetes Guidelines if Diabetic/Glucose > 140 Description: If diabetic or lab glucose is > 140 mg/dl - Initiate Diabetes/Hyperglycemia Guidelines & Document Interventions  Outcome: Progressing   Problem: RH BOWEL ELIMINATION Goal: RH STG MANAGE BOWEL WITH ASSISTANCE Description: STG Manage Bowel with Mod I assist Assistance. Outcome: Progressing Goal: RH STG MANAGE BOWEL W/MEDICATION W/ASSISTANCE Description: STG Manage Bowel with Medication with mod I Assistance. Outcome: Progressing   Problem: RH BLADDER ELIMINATION Goal: RH STG MANAGE BLADDER WITH ASSISTANCE Description: STG Manage Bladder With min Assistance Outcome: Progressing   Problem: RH SAFETY Goal: RH STG ADHERE TO SAFETY PRECAUTIONS W/ASSISTANCE/DEVICE Description: STG Adhere to Safety Precautions With supervision Assistance/Device. Outcome: Progressing Goal: RH STG DECREASED RISK OF FALL WITH ASSISTANCE Description: STG Decreased Risk of Fall With min Assistance. Outcome: Progressing   Problem: RH COGNITION-NURSING Goal: RH STG USES MEMORY AIDS/STRATEGIES W/ASSIST TO PROBLEM SOLVE Description: STG Uses Memory Aids/Strategies With min Assistance to Problem Solve. Outcome: Progressing Goal: RH STG ANTICIPATES NEEDS/CALLS FOR ASSIST W/ASSIST/CUES Description: STG Anticipates Needs/Calls for Assist With min Assistance/Cues. Outcome: Progressing   Problem: RH KNOWLEDGE DEFICIT Goal: RH STG INCREASE KNOWLEDGE OF DIABETES Description: Patient and family will be able to manage DM using educational resources for medications and dietary modification independently Outcome: Progressing Goal: RH STG INCREASE KNOWLEDGE OF HYPERTENSION Description: Patient and family will be able to manage HTN using educational resources  for medications and dietary modification independently Outcome: Progressing Goal: RH STG INCREASE KNOWLEGDE OF HYPERLIPIDEMIA Description: Patient and family will be able to manage HLD using educational resources for medications and dietary modification independently Outcome: Progressing Goal: RH STG INCREASE KNOWLEDGE OF STROKE PROPHYLAXIS Description: Patient and family will be able to manage secondary risks using educational resources for medications and dietary modification independently Outcome: Progressing

## 2023-11-23 NOTE — Plan of Care (Signed)
  Problem: RH SAFETY Goal: RH STG ADHERE TO SAFETY PRECAUTIONS W/ASSISTANCE/DEVICE Description: STG Adhere to Safety Precautions With supervision Assistance/Device. Outcome: Progressing Goal: RH STG DECREASED RISK OF FALL WITH ASSISTANCE Description: STG Decreased Risk of Fall With min Assistance. Outcome: Progressing   Problem: RH COGNITION-NURSING Goal: RH STG USES MEMORY AIDS/STRATEGIES W/ASSIST TO PROBLEM SOLVE Description: STG Uses Memory Aids/Strategies With min Assistance to Problem Solve. Outcome: Progressing Flowsheets (Taken 11/23/2023 1639) STG: Uses memory aids/strategies with assistance: 4-Minimal assistance

## 2023-11-23 NOTE — Progress Notes (Signed)
 Speech Language Pathology Daily Session Note  Patient Details  Name: [name] [name] MRN: [id number] Date of Birth: Jan 17, 1941  Today's Date: 11/23/2023 SLP Individual Time: 1430-1510 SLP Individual Time Calculation (min): 40 min and Today's Date: 11/23/2023 SLP Missed Time: 20 Minutes Missed Time Reason: Patient fatigue  Short Term Goals: Week 1: SLP Short Term Goal 1 (Week 1): Pt will participate in a structured eval and/or diagnostic tx tasks to determine need for further dysphagia intervention SLP Short Term Goal 2 (Week 1): Pt will complete EMST w/ modA to facilitate increased breath support during speech production SLP Short Term Goal 3 (Week 1): Pt will recall compensatory speech strategies in 3/4 opportunities given minA SLP Short Term Goal 4 (Week 1): Pt will utilize compensatory speech strategies at phrase level to maintain 50% intelligibility or greater given modA SLP Short Term Goal 5 (Week 1): Pt will complete mildly specific word finding tasks w/ supervisionA SLP Short Term Goal 6 (Week 1): Pt will consume regular diet and thin liquids with minimal to no s/s aspiration provided sup A cues for safe swallow strategies as trained  Skilled Therapeutic Interventions:   Pt and daughter greeted at bedside. He was very fatigued from PT tx session prior. Despite fatigue, he was agreeable to tx tasks targeting motor speech production and demonstrated excellent motivation. EMST targeted via [id number] and he completed 25 reps @ 20cmH2O. He benefited from minA cues to ensure adequate technique given labial seal difficulties. After EMST, SLP facilitated specific word finding tasks. He benefited from maxA throughout and reported "I can't" after ~15 trials. Anticipate fatigue significantly impacted success w/ specific naming. Adequate word finding noted in simple conversation re wants/needs. Tx tasks discontinued as pt was unable to remain awake despite frequent verbal/tactile cueing. He was left in  lowered bed with the alarm set and call light within reach. Recommend cont ST.   Pain Pain Assessment Pain Scale: 0-10 Pain Score: 0-No pain  Therapy/Group: Individual Therapy  Rozell Cornet 11/23/2023, 2:55 PM

## 2023-11-23 NOTE — Progress Notes (Signed)
 Patient ID: [name] [name], male   DOB: 07/17/1941, 83 y.o.   MRN: [id number]   SW met with pt, pt son and his wife, and sister in room to provide updates from team conference, and d/c date 4/30. Family will discuss which day they can come in for family edu. Family remains unsure on complete plan of care on who all will be helping pt. The dtr Sherry's husband has surgery 6/1, which limits her availability and other children work during the day. SW discussed Pensions consultant. Family encouraged to make a decision after family edu is completed.   Norval Been, MSW, LCSW Office: [phone] Cell: [phone] Fax: [phone]

## 2023-11-23 NOTE — Progress Notes (Signed)
 Physical Therapy Session Note  Patient Details  Name: [name] [name] MRN: [id number] Date of Birth: Feb 11, 1941  Today's Date: 11/23/2023 PT Individual Time: 1400-1430 PT Individual Time Calculation (min): 30 min   Short Term Goals: Week 1:  PT Short Term Goal 1 (Week 1): Pt will perform bed mobility with overall MinA. PT Short Term Goal 2 (Week 1): Pt will improve sitting balance with increased ability to produce forward lean with decreased vc. PT Short Term Goal 3 (Week 1): Pt will perform sit <>stand with MinA. PT Short Term Goal 4 (Week 1): Pt will perform stand pivot with MinA. PT Short Term Goal 5 (Week 1): Pt will ambulate at least 50 ft using LRAD with MinA.  Skilled Therapeutic Interventions/Progress Updates:    pt received in bed and agreeable to therapy. No complaint of pain. Pt reporting fatigue and demoes drowsiness on arrival. Pt agreeable to bed level therapy.  Pt performed the following exercises to promote LE strength and endurance:  -glute bridges 4 x 10 -supine marches with red T band  4 x 10 (alternated with bridges for active rest) -hooklying clam shells with red band 2 x 2 min, 1 LE moving at a time to promote stability in unilateral positions  -supine adduction squeeze 10 x 5 sec, alternated with clams  Pt remained in bed at end of session, beginning to doze off. Pt was left with all needs in reach and alarm active.   Therapy Documentation Precautions:  Precautions Precautions: Fall Recall of Precautions/Restrictions: Impaired Precaution/Restrictions Comments: per chart, impulsive; on eval is lethargic/ medicated Restrictions Weight Bearing Restrictions Per Provider Order: No General:     Therapy/Group: Individual Therapy  Tex Filbert 11/23/2023, 2:09 PM

## 2023-11-23 NOTE — Progress Notes (Signed)
 PROGRESS NOTE   Subjective/Complaints: No new complaints this morning Patient's chart reviewed- No issues reported overnight Vitals signs stable   ROS: Denies CP, SOB, N/V, improved dorsiflexion of right foot, +constipation   Objective:   No results found. No results for input(s): "WBC", "HGB", "HCT", "PLT" in the last 72 hours.  Recent Labs    11/21/23 0537 11/22/23 0529  NA 142 140  K 5.4* 4.2  CL 107 112*  CO2 27 23  GLUCOSE 124* 107*  BUN 28* 23  CREATININE 1.37* 1.10  CALCIUM 8.7* 8.3*    Intake/Output Summary (Last 24 hours) at 11/23/2023 1151 Last data filed at 11/22/2023 1830 Gross per 24 hour  Intake 320 ml  Output --  Net 320 ml        Physical Exam: Vital Signs Blood pressure (!) 149/83, pulse 85, temperature 98.5 F (36.9 C), temperature source Oral, resp. rate 18, height 6' (1.829 m), weight 88.8 kg, SpO2 97%.   Gen: no distress,  appears comfortable laying in bed  HEENT: oral mucosa pink and moist, NCAT Cardio: RRR Chest: CTAB, normal effort, normal rate of breathing Abd: soft, non-distended, +BS Ext: no edema Psych: pleasant, flat affect Skin: intact Neuro: Alert and oriented x3 (to month, note date), slow processing, oriented to person only Musculoskeletal: 3/5 strength on right side Stable 4/17  Assessment/Plan: 1. Functional deficits which require 3+ hours per day of interdisciplinary therapy in a comprehensive inpatient rehab setting. Physiatrist is providing close team supervision and 24 hour management of active medical problems listed below. Physiatrist and rehab team continue to assess barriers to discharge/monitor patient progress toward functional and medical goals  Care Tool:  Bathing    Body parts bathed by patient: Right arm, Left arm, Chest, Abdomen, Front perineal area, Right upper leg, Left upper leg, Face, Right lower leg, Left lower leg   Body parts bathed by  helper: Buttocks     Bathing assist Assist Level: Minimal Assistance - Patient > 75%     Upper Body Dressing/Undressing Upper body dressing   What is the patient wearing?: Pull over shirt    Upper body assist Assist Level: Supervision/Verbal cueing    Lower Body Dressing/Undressing Lower body dressing      What is the patient wearing?: Incontinence brief, Pants     Lower body assist Assist for lower body dressing: Moderate Assistance - Patient 50 - 74%     Toileting Toileting Toileting Activity did not occur (Clothing management and hygiene only): N/A (no void or bm)  Toileting assist Assist for toileting: Moderate Assistance - Patient 50 - 74%     Transfers Chair/bed transfer  Transfers assist     Chair/bed transfer assist level: Minimal Assistance - Patient > 75%     Locomotion Ambulation   Ambulation assist      Assist level: Moderate Assistance - Patient 50 - 74% Assistive device: Walker-rolling Max distance: 12 ft   Walk 10 feet activity   Assist     Assist level: Moderate Assistance - Patient - 50 - 74% Assistive device: Walker-rolling   Walk 50 feet activity   Assist Walk 50 feet with 2 turns activity did not  occur: Safety/medical concerns         Walk 150 feet activity   Assist Walk 150 feet activity did not occur: Safety/medical concerns         Walk 10 feet on uneven surface  activity   Assist Walk 10 feet on uneven surfaces activity did not occur: Safety/medical concerns         Wheelchair     Assist Is the patient using a wheelchair?: Yes Type of Wheelchair: Manual    Wheelchair assist level: Dependent - Patient 0% Max wheelchair distance: 12 ft    Wheelchair 50 feet with 2 turns activity    Assist    Wheelchair 50 feet with 2 turns activity did not occur: Safety/medical concerns       Wheelchair 150 feet activity     Assist  Wheelchair 150 feet activity did not occur: Safety/medical  concerns       Blood pressure (!) 149/83, pulse 85, temperature 98.5 F (36.9 C), temperature source Oral, resp. rate 18, height 6' (1.829 m), weight 88.8 kg, SpO2 97%. Medical Problem List and Plan: 1. 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  Worsening ability to ambulate: messaged therapy to see how he did in their session today -DVT/anticoagulation:  Pharmaceutical: Lovenox             -antiplatelet therapy: aspirin, plavix, recommended DAPT for 90 days followed by Plavix 75mg  daily monotherapy  3. Thrombocytosis: continue DAPT  4. Ischemic cardiomyopathy: continue lopressor  5. Impaired cognition: This patient is not capable of making decisions on his own behalf.  6. HTN: continue lopressor -BP controlled, continue current regimen, add magnesium gluconate 250mg  HS    11/23/2023   11:09 AM 11/23/2023    5:08 AM 11/22/2023    7:39 PM  Vitals with BMI  Systolic 149 145 960  Diastolic 83 91 71  Pulse 85 73 77    7.CAD s/p stent to LAD in 2012: continue aspirin and plavix   8. Prediabetes: d/c ISS CBG (last 3)  Recent Labs    11/20/23 2046 11/21/23 0606 11/21/23 1135  GLUCAP 138* 110* 128*    9. Delirium: resolved, d/c seroquel  10. Overweight: provide dietary education, d/c feeding supplement, vitamin D and potassium ordered  11. Apneic episodes: referred for outpatient sleep study, O2 ordered HS.   12. AKI: Cr reviewed and has improved/resolved  13. Vitamin D insufficiency: started D3 1,000U daily  14. Hyperkalemia: lokelma ordered, K+ reviewed and has resolved  15. Constipation: last BM 4/15, messaged nursing to confirm accuracy  LOS: 4 days A FACE TO FACE EVALUATION WAS PERFORMED  [name] [name] 11/23/2023, 11:51 AM

## 2023-11-23 NOTE — Progress Notes (Signed)
 Speech Language Pathology Daily Session Note  Patient Details  Name: [name] [name] MRN: [id number] Date of Birth: 01-Oct-1940  Today's Date: 11/23/2023 SLP Individual Time: 1119-1201 SLP Individual Time Calculation (min): 42 min  Short Term Goals: Week 1: SLP Short Term Goal 1 (Week 1): Pt will participate in a structured eval and/or diagnostic tx tasks to determine need for further dysphagia intervention SLP Short Term Goal 2 (Week 1): Pt will complete EMST w/ modA to facilitate increased breath support during speech production SLP Short Term Goal 3 (Week 1): Pt will recall compensatory speech strategies in 3/4 opportunities given minA SLP Short Term Goal 4 (Week 1): Pt will utilize compensatory speech strategies at phrase level to maintain 50% intelligibility or greater given modA SLP Short Term Goal 5 (Week 1): Pt will complete mildly specific word finding tasks w/ supervisionA SLP Short Term Goal 6 (Week 1): Pt will consume regular diet and thin liquids with minimal to no s/s aspiration provided sup A cues for safe swallow strategies as trained  Skilled Therapeutic Interventions: SLP conducted skilled therapy session targeting communication goals. Patient discussed events from the morning and utilizing schedule visual aid, discussed upcoming therapy sessions. Patient benefited from max verbal cues to increase vocal intensity. Facilitated 5 sets of 5 repetitions EMST at 20 cmH2O. Patient completed with min to mod verbal cues. Then targeted tongue strength utilizing IOPI with 3 sets of 10 in anterior and posterior positions at 26 kPa and 20 kPa respectively. Patient was left in chair with call bell in reach and chair alarm set. SLP will continue to target goals per plan of care.       Pain Pain Assessment Pain Scale: 0-10 Pain Score: 0-No pain  Therapy/Group: Individual Therapy  Lauren Aguayo, M.A., CCC-SLP  Raman Featherston A Ellary Casamento 11/23/2023, 12:39 PM

## 2023-11-24 DIAGNOSIS — I63512 Cerebral infarction due to unspecified occlusion or stenosis of left middle cerebral artery: Secondary | ICD-10-CM | POA: Diagnosis not present

## 2023-11-24 MED ORDER — TRAZODONE HCL 50 MG PO TABS
50.0000 mg | ORAL_TABLET | Freq: Every day | ORAL | Status: DC
  Administered 2023-11-24 – 2023-12-05 (×12): 50 mg via ORAL
  Filled 2023-11-24 (×12): qty 1

## 2023-11-24 MED ORDER — MAGNESIUM HYDROXIDE 400 MG/5ML PO SUSP
30.0000 mL | Freq: Once | ORAL | Status: AC
  Administered 2023-11-24: 30 mL via ORAL
  Filled 2023-11-24: qty 30

## 2023-11-24 MED ORDER — AMANTADINE HCL 100 MG PO CAPS
100.0000 mg | ORAL_CAPSULE | Freq: Every day | ORAL | Status: DC
  Administered 2023-11-25 – 2023-12-06 (×12): 100 mg via ORAL
  Filled 2023-11-24 (×12): qty 1

## 2023-11-24 NOTE — Progress Notes (Signed)
 Speech Language Pathology Daily Session Note  Patient Details  Name: [name] [name] MRN: [id number] Date of Birth: 03/22/1941  Today's Date: 11/24/2023 SLP Individual Time: 4098-1191 SLP Individual Time Calculation (min): 38 min  Short Term Goals: Week 1: SLP Short Term Goal 1 (Week 1): Pt will participate in a structured eval and/or diagnostic tx tasks to determine need for further dysphagia intervention SLP Short Term Goal 2 (Week 1): Pt will complete EMST w/ modA to facilitate increased breath support during speech production SLP Short Term Goal 3 (Week 1): Pt will recall compensatory speech strategies in 3/4 opportunities given minA SLP Short Term Goal 4 (Week 1): Pt will utilize compensatory speech strategies at phrase level to maintain 50% intelligibility or greater given modA SLP Short Term Goal 5 (Week 1): Pt will complete mildly specific word finding tasks w/ supervisionA SLP Short Term Goal 6 (Week 1): Pt will consume regular diet and thin liquids with minimal to no s/s aspiration provided sup A cues for safe swallow strategies as trained  Skilled Therapeutic Interventions: SLP conducted skilled therapy session targeting communication and cognitive goals. Given patient performance in previous ST sessions, SLP conducted cognitive screening measure to assess cognitive function. Daughter present throughout session, and she reports changes to patient's cognition compared to baseline. Likewise, patient indicates that he feels impaired cognitively since the stroke. SLP administered SLUMS examination with patient scoring 8/30 showing deficits across all tested domains including immediate memory, delayed memory, working memory, attention, visuospatial skills, and executive functioning. Will add cognitive goals to POC to reflect performance. SLP facilitated completion of EMST (5 sets of 5) at [facility]. Patient benefited from min to mod cues for accurate completion. SLP then discussed SLOP speech  intelligibility strategies. Using visual aid to assist recall, patient stated all 4 SLOP strategies. Given mod verbal cues, patient utilized LOUD and SLOW strategies to achieve 100% intelligibility at the spontaneous word level and 80% intelligibility at the sentence level. Patient was left in room with call bell in reach and alarm set. SLP will continue to target goals per plan of care.        Pain Pain Assessment Pain Scale: 0-10 Pain Score: 0-No pain  Therapy/Group: Individual Therapy  Adanely Reynoso, M.A., CCC-SLP  Terilyn Sano A Janeice Stegall 11/24/2023, 2:49 PM

## 2023-11-24 NOTE — Plan of Care (Signed)
  Problem: RH BOWEL ELIMINATION Goal: RH STG MANAGE BOWEL WITH ASSISTANCE Description: STG Manage Bowel with Mod I assist Assistance. Outcome: Not Progressing Goal: RH STG MANAGE BOWEL W/MEDICATION W/ASSISTANCE Description: STG Manage Bowel with Medication with mod I Assistance. Outcome: Not Progressing   Problem: RH BLADDER ELIMINATION Goal: RH STG MANAGE BLADDER WITH ASSISTANCE Description: STG Manage Bladder With min Assistance Outcome: Not Progressing   Problem: RH SAFETY Goal: RH STG ADHERE TO SAFETY PRECAUTIONS W/ASSISTANCE/DEVICE Description: STG Adhere to Safety Precautions With supervision Assistance/Device. Outcome: Progressing Goal: RH STG DECREASED RISK OF FALL WITH ASSISTANCE Description: STG Decreased Risk of Fall With min Assistance. Outcome: Progressing Flowsheets (Taken 11/24/2023 1715) DUH:Izrmzjdzi risk of fall  with assistance/device: 3-Moderate assistance   Problem: RH SAFETY Goal: RH STG DECREASED RISK OF FALL WITH ASSISTANCE Description: STG Decreased Risk of Fall With min Assistance. Outcome: Progressing Flowsheets (Taken 11/24/2023 1715) DUH:Izrmzjdzi risk of fall  with assistance/device: 3-Moderate assistance

## 2023-11-24 NOTE — Progress Notes (Signed)
 Physical Therapy Session Note  Patient Details  Name: [name] [name] MRN: [id number] Date of Birth: 1940/09/11  Today's Date: 11/24/2023 PT Individual Time: [phone] and 0912-0955 PT Individual Time Calculation (min): 41 min and 43 min  Short Term Goals: Week 1:  PT Short Term Goal 1 (Week 1): Pt will perform bed mobility with overall MinA. PT Short Term Goal 2 (Week 1): Pt will improve sitting balance with increased ability to produce forward lean with decreased vc. PT Short Term Goal 3 (Week 1): Pt will perform sit <>stand with MinA. PT Short Term Goal 4 (Week 1): Pt will perform stand pivot with MinA. PT Short Term Goal 5 (Week 1): Pt will ambulate at least 50 ft using LRAD with MinA.  Skilled Therapeutic Interventions/Progress Updates:   Treatment Session 1 Received pt sitting upright in bed with NT assisting with breakfast - PT took over with care. Pt agreeable to PT treatment and denied any pain during session. Session with emphasis on functional mobility/transfers, dressing, generalized strengthening and endurance, and self-feeding. Encouraged getting OOB to eat and pt agreed - transferred semi-reclined<>sitting L EOB with HOB elevated and use of bedrails with supervision and increased time with cues for anterior weight shifting to avoid posterior lean - pt with improvements in sitting balance and scooting to EOB this morning with no posterior lean noted.  Donned socks, shoes, and pants sitting EOB with max A for time purposes and stood from EOB with RW and min A - required mod/max A to pull pants over hips. Transferred into WC via stand<>pivot with RW and light min A and set pt up to eat breakfast (full supervision required). Assisted pt with cleaning dentures and applying fixodent. Pt with excellent recall to take small bites/sips. Pt able to prepare coffee independently and cut up/eat muffin with set up assist (therapist just took muffin out of wrapper). Pt required increased time  to eat and with 1 coughing episode. Concluded session with pt sitting in [facility] with needs within reach - handed off remainder of self-feeding to NT due to time restrictions.   Treatment Session 2 Received pt sitting in WC, pt agreeable to PT treatment, and denied any pain during session. Session with emphasis on functional mobility/transfers, generalized strengthening and endurance, dynamic standing balance/coordination, stair navigation, and gait training. Pt transported to/from room in [facility] dependently for time management purposes.   Pt performed all transfers with RW and min A fading to heavy min/light mod A with increasing fatigue - cues for 1UE on RW and 1UE on WC armrests when standing. Pt ambulated 135ft x 1 and 170ft x 1 with RW and CGA/min A with WC follow. Noted pt with tendency to drift to L (aware but difficulty self-correcting) and with mild L inattention running into obstacles on L side. Pt required cues for upright posture/gaze and to widen BOS. Stood at staircase with min A and navigated 8 6in steps with bilateral handrails and min A ascending with a step through and descending with a step to pattern with cues to ensure foot was completely on step prior to proceeding. Returned to room and pt requested to return to bed due to fatigue. Ambulated 51ft with RW and min A to bed, kicked off shoes, and transferred into supine with min A for BLE management. Concluded session with pt semi-reclined in bed, needs within reach, and bed alarm on.   Therapy Documentation Precautions:  Precautions Precautions: Fall Recall of Precautions/Restrictions: Impaired Precaution/Restrictions Comments: per chart, impulsive; on eval  is lethargic/ medicated Restrictions Weight Bearing Restrictions Per Provider Order: No  Therapy/Group: Individual Therapy [name] [name] PT, DPT 11/24/2023, 6:52 AM

## 2023-11-24 NOTE — Progress Notes (Signed)
 Occupational Therapy Session Note  Patient Details  Name: [name] [name] MRN: [id number] Date of Birth: 01-Jan-1941  Today's Date: 11/24/2023 OT Individual Time: 1050-1200 OT Individual Time Calculation (min): 70 min    Short Term Goals: Week 1:  OT Short Term Goal 1 (Week 1): Patient will transition to standing from sitting with mod assist to aide in pulling up/down LB clothing OT Short Term Goal 2 (Week 1): Patient will don LB clothing with mod assist OT Short Term Goal 3 (Week 1): Patient will trasnfer to commode with mod assist OT Short Term Goal 4 (Week 1): Patient will correctly sequence steps of familiar functional activity - eg. wash before dress, socks before shoes without cueing.  Skilled Therapeutic Interventions/Progress Updates:  Skilled OT intervention completed with focus on functional ambulatory endurance, dynamic standing balance, cognition and ADL retraining. Pt received upright in bed, agreeable to session. No pain reported.  With HOB slightly inclined, pt transitioned > EOB with use of bed rails but overall supervision, with cues needed to scoot forward. Cues needed for hand placement with 1 on bed and other on RW, and unable to stand from bed at lowest position. Daughter present who initiated raising the bed very high, however education provided on importance of practicing from low surfaces even if a challenge to manage surfaces at home that are lower with plan to have family fill out home measurement sheet to prepare for future accommodations that may be needed.  From elevated bed, pt stood CGA with cues for anterior weight shifting with RW, then CGA stand pivot with RW > w/c. Transported dependently in w/c > gym. CGA sit > stand and stand pivot with again cues for anterior weight shifting with RW > EOM.  Pt participated in the following dynamic standing balance and endurance tasks to promote independence and safety during BADLs and functional mobility: -cornhole toss  activity. CGA sit > stand with RW, and CGA needed for balance when tossing with either hand. No LOB, however seated rest breaks provided for fatigue and cues needed to avoid using back of knees on mat for compensatory balance -placing and retrieving pegs from pegboard. CGA sit > stand without AD. CGA needed for dynamic standing balance during task. Min A needed to set up the outline of the simple design, but then pt able to do rest with supervision with 90% accuracy  Pt ambulated with CGA using RW about 100 ft back to room with cues needed for stride length and BLE clearance as pt with fluctuating shuffling gait. Back in room, encouraged pt to trial void to manage incontinence in which pt reported the lack of need to go, however once assisted to toilet and min A to lower clothing, pt was continent of urinary void only. Stood and required mod A for donning clothing over hips. Ambulated with CGA and RW > w/c in room. Pt remained seated in w/c with daughter present with all needs in reach at end of session.   Therapy Documentation Precautions:  Precautions Precautions: Fall Recall of Precautions/Restrictions: Impaired Precaution/Restrictions Comments: per chart, impulsive; on eval is lethargic/ medicated Restrictions Weight Bearing Restrictions Per Provider Order: No    Therapy/Group: Individual Therapy  Ruthanna Covert, MS, OTR/L  11/24/2023, 12:23 PM

## 2023-11-24 NOTE — Progress Notes (Signed)
 PROGRESS NOTE   Subjective/Complaints: No new complaints this morning Ambulated 194 feet with [name] [name] today! Denies pain Milk of mag ordered for constipation  ROS: Denies CP, SOB, N/V, improved dorsiflexion of right foot, +constipation- continues   Objective:   No results found. No results for input(s): "WBC", "HGB", "HCT", "PLT" in the last 72 hours.  Recent Labs    11/22/23 0529  NA 140  K 4.2  CL 112*  CO2 23  GLUCOSE 107*  BUN 23  CREATININE 1.10  CALCIUM  8.3*    Intake/Output Summary (Last 24 hours) at 11/24/2023 0952 Last data filed at 11/24/2023 0820 Gross per 24 hour  Intake 520 ml  Output 300 ml  Net 220 ml        Physical Exam: Vital Signs Blood pressure (!) 153/84, pulse 73, temperature 98.2 F (36.8 C), temperature source Oral, resp. rate 16, height 6' (1.829 m), weight 88.8 kg, SpO2 97%.   Gen: no distress,  appears comfortable laying in bed  HEENT: oral mucosa pink and moist, NCAT Cardio: RRR Chest: CTAB, normal effort, normal rate of breathing Abd: soft, non-distended, +BS Ext: no edema Psych: pleasant, flat affect Skin: intact Neuro: Alert and oriented x3 (to month, note date), slow processing, oriented to person only Musculoskeletal: 3/5 strength on right side Stable 4/18  Assessment/Plan: 1. Functional deficits which require 3+ hours per day of interdisciplinary therapy in a comprehensive inpatient rehab setting. Physiatrist is providing close team supervision and 24 hour management of active medical problems listed below. Physiatrist and rehab team continue to assess barriers to discharge/monitor patient progress toward functional and medical goals  Care Tool:  Bathing    Body parts bathed by patient: Right arm, Left arm, Chest, Abdomen, Front perineal area, Right upper leg, Left upper leg, Face, Right lower leg, Left lower leg   Body parts bathed by helper: Buttocks      Bathing assist Assist Level: Minimal Assistance - Patient > 75%     Upper Body Dressing/Undressing Upper body dressing   What is the patient wearing?: Pull over shirt    Upper body assist Assist Level: Supervision/Verbal cueing    Lower Body Dressing/Undressing Lower body dressing      What is the patient wearing?: Incontinence brief, Pants     Lower body assist Assist for lower body dressing: Moderate Assistance - Patient 50 - 74%     Toileting Toileting Toileting Activity did not occur (Clothing management and hygiene only): N/A (no void or bm)  Toileting assist Assist for toileting: Moderate Assistance - Patient 50 - 74%     Transfers Chair/bed transfer  Transfers assist     Chair/bed transfer assist level: Minimal Assistance - Patient > 75%     Locomotion Ambulation   Ambulation assist      Assist level: Moderate Assistance - Patient 50 - 74% Assistive device: Walker-rolling Max distance: 12 ft   Walk 10 feet activity   Assist     Assist level: Moderate Assistance - Patient - 50 - 74% Assistive device: Walker-rolling   Walk 50 feet activity   Assist Walk 50 feet with 2 turns activity did not occur: Safety/medical concerns  Walk 150 feet activity   Assist Walk 150 feet activity did not occur: Safety/medical concerns         Walk 10 feet on uneven surface  activity   Assist Walk 10 feet on uneven surfaces activity did not occur: Safety/medical concerns         Wheelchair     Assist Is the patient using a wheelchair?: Yes Type of Wheelchair: Manual    Wheelchair assist level: Dependent - Patient 0% Max wheelchair distance: 12 ft    Wheelchair 50 feet with 2 turns activity    Assist    Wheelchair 50 feet with 2 turns activity did not occur: Safety/medical concerns       Wheelchair 150 feet activity     Assist  Wheelchair 150 feet activity did not occur: Safety/medical concerns       Blood  pressure (!) 153/84, pulse 73, temperature 98.2 F (36.8 C), temperature source Oral, resp. rate 16, height 6' (1.829 m), weight 88.8 kg, SpO2 97%. Medical Problem List and Plan: 1. 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  Ambulated 194 feet today!  continue Lovenox              -antiplatelet therapy: aspirin , plavix , recommended DAPT for 90 days followed by Plavix  75mg  daily monotherapy  3. Thrombocytosis: continue DAPT  4. Ischemic cardiomyopathy: continue lopressor   5. Impaired cognition: This patient is not capable of making decisions on his own behalf.  6. HTN: continue lopressor  -BP controlled, continue current regimen, add magnesium  gluconate 250mg  HS    11/24/2023    4:27 AM 11/23/2023    7:28 PM 11/23/2023    3:28 PM  Vitals with BMI  Systolic 153 142 865  Diastolic 84 73 67  Pulse 73 76 69    7.CAD s/p stent to LAD in 2012: continue aspirin  and plavix    8. Prediabetes: d/c ISS CBG (last 3)  Recent Labs    11/21/23 1135  GLUCAP 128*    9. Delirium: resolved, d/c seroquel   10. Overweight: provide dietary education, d/c feeding supplement, vitamin D  and potassium ordered  11. Apneic episodes: referred for outpatient sleep study, O2 ordered HS.   12. AKI: Cr reviewed and has improved/resolved  13. Vitamin D  insufficiency: started D3 1,000U daily  14. Hyperkalemia: lokelma  ordered, K+ reviewed and has resolved  15. Constipation: milk of mag ordered  LOS: 5 days A FACE TO FACE EVALUATION WAS PERFORMED  [name] [name] [name] [name] 11/24/2023, 9:52 AM

## 2023-11-25 NOTE — Progress Notes (Signed)
 Physical Therapy Session Note  Patient Details  Name: [name] [name] MRN: [id number] Date of Birth: 06-Apr-1941  Today's Date: 11/25/2023 PT Individual Time: 2841-3244 PT Individual Time Calculation (min): 57 min   Short Term Goals: Week 1:  PT Short Term Goal 1 (Week 1): Pt will perform bed mobility with overall MinA. PT Short Term Goal 2 (Week 1): Pt will improve sitting balance with increased ability to produce forward lean with decreased vc. PT Short Term Goal 3 (Week 1): Pt will perform sit <>stand with MinA. PT Short Term Goal 4 (Week 1): Pt will perform stand pivot with MinA. PT Short Term Goal 5 (Week 1): Pt will ambulate at least 50 ft using LRAD with MinA.  Skilled Therapeutic Interventions/Progress Updates:   Received pt semi-reclined, pt agreeable to PT treatment, and denied any pain during session. Session with emphasis on functional mobility/transfers, generalized strengthening and endurance, dynamic standing balance/coordination, and gait training. Pt transferred semi-reclined<>sitting L EOB with HOB elevated and use of bedrails with supervision and increased time/effort. Noted posterior bias but pt able to correct without physical assist with increased time.   Donned shoes with max A and pt performed all transfers with RW and min A throughout session - occasional cues to scoot to edge of WC and for hand placement when standing. Pt ambulated 157ft with RW and min A to dayroom and performed seated BUE/BLE strengthening on Nustep at workload 4 for 8 minutes for a total of 519 steps. Pt on pace partner program with emphasis on cardiovascular endurance and maintaining steady pace. Pt frequently falling behind and required mod cues to catch up to target -- 0.3 miles, SPM 62, and avg METs 1.7.   Pt ambulated additional 61ft x 1 and 140ft with RW and min A - cues to increase step length to avoid shuffling. Pt then reported not getting breakfast tray this morning - RN notified and tray  ordered. Performed blocked practice mini squats 2x10 with BUE support on RW and CGA for balance with emphasis on quad strength. Returned to room and ambulated 67ft with RW and min A to recliner (noted 1 L lateral LOB requiring assist to correct). Concluded session with pt sitting in recliner, needs within reach, and seatbelt alarm on. Daughter and son in law present at bedside.   Therapy Documentation Precautions:  Precautions Precautions: Fall Recall of Precautions/Restrictions: Impaired Precaution/Restrictions Comments: per chart, impulsive; on eval is lethargic/ medicated Restrictions Weight Bearing Restrictions Per Provider Order: No  Therapy/Group: Individual Therapy [name] [name] PT, DPT 11/25/2023, 7:06 AM

## 2023-11-25 NOTE — Progress Notes (Signed)
 Physical Therapy Weekly Progress Note  Patient Details  Name: [name] [name] MRN: [id number] Date of Birth: 03/21/1941  Beginning of progress report period: November 20, 2023 End of progress report period: November 25, 2023  Patient has met 5 of 5 short term goals. Pt demonstrates excellent progress toward long term goals - goals upgraded to supervision overall. Pt able to roll L/R and transfer semi-reclined<>sitting EOB with supervision, but requires min A for sit<>supine for LE management. Pt able to perform all transfers with RW and min A with cues for technique, ambulate up to 171ft with RW and min A, and navigate 8 6in steps with bilateral handrails and min A. Pt continues to be limited by fatigue, cognitive impairments, and decreased standing balance/coordination.  Patient continues to demonstrate the following deficits muscle weakness, decreased cardiorespiratoy endurance, impaired timing and sequencing, abnormal tone, unbalanced muscle activation, and decreased coordination, decreased problem solving and decreased memory, and decreased standing balance, decreased postural control, hemiplegia, and decreased balance strategies and therefore will continue to benefit from skilled PT intervention to increase functional independence with mobility.  Patient progressing toward long term goals..  Continue plan of care.  PT Short Term Goals Week 1:  PT Short Term Goal 1 (Week 1): Pt will perform bed mobility with overall MinA. PT Short Term Goal 1 - Progress (Week 1): Met PT Short Term Goal 2 (Week 1): Pt will improve sitting balance with increased ability to produce forward lean with decreased vc. PT Short Term Goal 2 - Progress (Week 1): Met PT Short Term Goal 3 (Week 1): Pt will perform sit <>stand with MinA. PT Short Term Goal 3 - Progress (Week 1): Met PT Short Term Goal 4 (Week 1): Pt will perform stand pivot with MinA. PT Short Term Goal 4 - Progress (Week 1): Met PT Short Term Goal 5 (Week  1): Pt will ambulate at least 50 ft using LRAD with MinA. PT Short Term Goal 5 - Progress (Week 1): Met Week 2:  PT Short Term Goal 1 (Week 2): pt will perform bed mobility with CGA consistantly PT Short Term Goal 2 (Week 2): pt will perform bed<>chair transfers with LRAD and CGA PT Short Term Goal 3 (Week 2): pt will ambulate 37ft with LRAD and CGA  Skilled Therapeutic Interventions/Progress Updates:  Ambulation/gait training;Cognitive remediation/compensation;Discharge planning;DME/adaptive equipment instruction;Functional mobility training;Pain management;Psychosocial support;Splinting/orthotics;Therapeutic Activities;UE/LE Strength taining/ROM;Visual/perceptual remediation/compensation;Wheelchair propulsion/positioning;UE/LE Coordination activities;Therapeutic Exercise;Stair training;Skin care/wound management;Patient/family education;Neuromuscular re-education;Disease management/prevention;Community reintegration;Balance/vestibular training   Therapy Documentation Precautions:  Precautions Precautions: Fall Recall of Precautions/Restrictions: Impaired Precaution/Restrictions Comments: per chart, impulsive; on eval is lethargic/ medicated Restrictions Weight Bearing Restrictions Per Provider Order: No  Therapy/Group: Individual Therapy [name] [name] PT, DPT 11/25/2023, 7:24 AM

## 2023-11-25 NOTE — Plan of Care (Signed)
  Problem: RH Balance Goal: LTG Patient will maintain dynamic standing balance (PT) Description: LTG:  Patient will maintain dynamic standing balance with assistance during mobility activities (PT) Flowsheets (Taken 11/25/2023 0725) LTG: Pt will maintain dynamic standing balance during mobility activities with:: (upgraded due to improved strength, endurance, and cognition) Supervision/Verbal cueing Note: upgraded due to improved strength, endurance, and cognition    Problem: Sit to Stand Goal: LTG:  Patient will perform sit to stand with assistance level (PT) Description: LTG:  Patient will perform sit to stand with assistance level (PT) Flowsheets (Taken 11/25/2023 0725) LTG: PT will perform sit to stand in preparation for functional mobility with assistance level: (upgraded due to improved strength, endurance, and cognition) Supervision/Verbal cueing Note: upgraded due to improved strength, endurance, and cognition    Problem: RH Bed Mobility Goal: LTG Patient will perform bed mobility with assist (PT) Description: LTG: Patient will perform bed mobility with assistance, with/without cues (PT). Flowsheets (Taken 11/25/2023 0725) LTG: Pt will perform bed mobility with assistance level of: (upgraded due to improved strength, endurance, and cognition) Supervision/Verbal cueing Note: upgraded due to improved strength, endurance, and cognition    Problem: RH Bed to Chair Transfers Goal: LTG Patient will perform bed/chair transfers w/assist (PT) Description: LTG: Patient will perform bed to chair transfers with assistance (PT). Flowsheets (Taken 11/25/2023 0725) LTG: Pt will perform Bed to Chair Transfers with assistance level: (upgraded due to improved strength, endurance, and cognition) Supervision/Verbal cueing Note: upgraded due to improved strength, endurance, and cognition    Problem: RH Car Transfers Goal: LTG Patient will perform car transfers with assist (PT) Description: LTG: Patient  will perform car transfers with assistance (PT). Flowsheets (Taken 11/25/2023 0725) LTG: Pt will perform car transfers with assist:: (upgraded due to improved strength, endurance, and cognition) Supervision/Verbal cueing Note: upgraded due to improved strength, endurance, and cognition    Problem: RH Furniture Transfers Goal: LTG Patient will perform furniture transfers w/assist (OT/PT) Description: LTG: Patient will perform furniture transfers  with assistance (OT/PT). Flowsheets (Taken 11/25/2023 0725) LTG: Pt will perform furniture transfers with assist:: (upgraded due to improved strength, endurance, and cognition) Supervision/Verbal cueing Note: upgraded due to improved strength, endurance, and cognition    Problem: RH Ambulation Goal: LTG Patient will ambulate in controlled environment (PT) Description: LTG: Patient will ambulate in a controlled environment, # of feet with assistance (PT). Flowsheets (Taken 11/25/2023 0725) LTG: Pt will ambulate in controlled environ  assist needed:: (upgraded due to improved strength, endurance, and cognition) Supervision/Verbal cueing LTG: Ambulation distance in controlled environment: 191ft with LRAD Goal: LTG Patient will ambulate in home environment (PT) Description: LTG: Patient will ambulate in home environment, # of feet with assistance (PT). Flowsheets (Taken 11/25/2023 0725) LTG: Pt will ambulate in home environ  assist needed:: (upgraded due to improved strength, endurance, and cognition) Supervision/Verbal cueing LTG: Ambulation distance in home environment: 16ft with LRAD   Problem: RH Stairs Goal: LTG Patient will ambulate up and down stairs w/assist (PT) Description: LTG: Patient will ambulate up and down # of stairs with assistance (PT) Flowsheets Taken 11/25/2023 0725 by Aneita Keens, PT LTG: Pt will ambulate up/down stairs assist needed:: (upgraded due to improved strength, endurance, and cognition) Contact Guard/Touching  assist Taken 11/21/2023 0658 by Donne Gage, PT LTG: Pt will  ambulate up and down number of stairs: at least 6 steps using HR setup as per home environment Note: upgraded due to improved strength, endurance, and cognition

## 2023-11-26 DIAGNOSIS — I63512 Cerebral infarction due to unspecified occlusion or stenosis of left middle cerebral artery: Secondary | ICD-10-CM | POA: Diagnosis not present

## 2023-11-26 NOTE — Progress Notes (Signed)
 Physical Therapy Session Note  Patient Details  Name: [name] [name] MRN: [id number] Date of Birth: Jan 23, 1941  Today's Date: 11/26/2023 PT Individual Time: 0865-7846 PT Individual Time Calculation (min): 58 min  and Today's Date: 11/26/2023 PT Missed Time: 17 Minutes Missed Time Reason: Patient fatigue  Short Term Goals: Week 1:  PT Short Term Goal 1 (Week 1): Pt will perform bed mobility with overall MinA. PT Short Term Goal 1 - Progress (Week 1): Met PT Short Term Goal 2 (Week 1): Pt will improve sitting balance with increased ability to produce forward lean with decreased vc. PT Short Term Goal 2 - Progress (Week 1): Met PT Short Term Goal 3 (Week 1): Pt will perform sit <>stand with MinA. PT Short Term Goal 3 - Progress (Week 1): Met PT Short Term Goal 4 (Week 1): Pt will perform stand pivot with MinA. PT Short Term Goal 4 - Progress (Week 1): Met PT Short Term Goal 5 (Week 1): Pt will ambulate at least 50 ft using LRAD with MinA. PT Short Term Goal 5 - Progress (Week 1): Met  Skilled Therapeutic Interventions/Progress Updates:     Pt seated in [facility] upon arrival with daughter in room. Pt agreeable to therapy. Pt denies any pain.   Pt and pt daughter requesting pt go outside during session. Part of session held outside for pt overall increased quality of life and improved mood. Pt very appreciative of going outside.   Bed mobility: pt performed sit to supine with supervision with use of bed rail, verbal l cues provided for repositioning in bed with use of bed rail  Transfers: pt performed sit to stand throughout session with RW and CGA-min A, more consistently min A, verbal and tactile cues provided for anterior weight shift. Attempted to do mass practice of sit to stand however pt fatigued after completion of 2 consecutive sit to stands---therapist opted to discontinue.   Gait: Pt ambulated loop around reflection garden outside, and loop around main gym  with RW and CGA, verbal  cues provided for forward gaze and reciprocal gait. Pt navigated 2x4 6 inch steps with B HR and CGA/min A, verbal and tactile cues provided for correction of R LE knee hyperextension with descending.  Pt requesting to get back in bed 2/2 fatigue. Pt supine in bed with all needs within reach and bed alarm on.        Therapy Documentation Precautions:  Precautions Precautions: Fall Recall of Precautions/Restrictions: Impaired Precaution/Restrictions Comments: per chart, impulsive; on eval is lethargic/ medicated Restrictions Weight Bearing Restrictions Per Provider Order: No  Therapy/Group: Individual Therapy  [facility] Jenney Modest, [place], DPT  11/26/2023, 10:33 AM

## 2023-11-26 NOTE — Progress Notes (Signed)
 PROGRESS NOTE   Subjective/Complaints: Pt reports no complaints this AM.  Is fine- LBM 2 days ago- doesn't feel constipated.  No issues.    ROS:   Pt denies SOB, abd pain, CP, N/V/C/D, and vision changes    Objective:   No results found. No results for input(s): "WBC", "HGB", "HCT", "PLT" in the last 72 hours.  No results for input(s): "NA", "K", "CL", "CO2", "GLUCOSE", "BUN", "CREATININE", "CALCIUM " in the last 72 hours.   Intake/Output Summary (Last 24 hours) at 11/26/2023 0927 Last data filed at 11/25/2023 1805 Gross per 24 hour  Intake 177 ml  Output --  Net 177 ml        Physical Exam: Vital Signs Blood pressure 131/68, pulse 73, temperature 97.9 F (36.6 C), resp. rate 18, height 6' (1.829 m), weight 88.8 kg, SpO2 95%.    General: awake, alert, appropriate, supine in bed- just woke him up; NAD HENT: conjugate gaze; oropharynx moist CV: regular rate and rhythm; no JVD Pulmonary: CTA B/L; no W/R/R- good air movement GI: soft, NT, ND, (+)BS- normoactive Psychiatric: appropriate- flat Neurological: alert, but sleepy Skin: intact Neuro: Alert and oriented x3 (to month, note date), slow processing, oriented to person only Musculoskeletal: 3/5 strength on right side Stable 4/18  Assessment/Plan: 1. Functional deficits which require 3+ hours per day of interdisciplinary therapy in a comprehensive inpatient rehab setting. Physiatrist is providing close team supervision and 24 hour management of active medical problems listed below. Physiatrist and rehab team continue to assess barriers to discharge/monitor patient progress toward functional and medical goals  Care Tool:  Bathing    Body parts bathed by patient: Right arm, Left arm, Chest, Abdomen, Front perineal area, Right upper leg, Left upper leg, Face, Right lower leg, Left lower leg   Body parts bathed by helper: Buttocks     Bathing assist  Assist Level: Minimal Assistance - Patient > 75%     Upper Body Dressing/Undressing Upper body dressing   What is the patient wearing?: Pull over shirt    Upper body assist Assist Level: Supervision/Verbal cueing    Lower Body Dressing/Undressing Lower body dressing      What is the patient wearing?: Incontinence brief, Pants     Lower body assist Assist for lower body dressing: Moderate Assistance - Patient 50 - 74%     Toileting Toileting Toileting Activity did not occur (Clothing management and hygiene only): N/A (no void or bm)  Toileting assist Assist for toileting: Moderate Assistance - Patient 50 - 74%     Transfers Chair/bed transfer  Transfers assist     Chair/bed transfer assist level: Minimal Assistance - Patient > 75%     Locomotion Ambulation   Ambulation assist      Assist level: Minimal Assistance - Patient > 75% Assistive device: Walker-rolling Max distance: 140ft   Walk 10 feet activity   Assist     Assist level: Minimal Assistance - Patient > 75% Assistive device: Walker-rolling   Walk 50 feet activity   Assist Walk 50 feet with 2 turns activity did not occur: Safety/medical concerns  Assist level: Minimal Assistance - Patient > 75% Assistive device: Walker-rolling  Walk 150 feet activity   Assist Walk 150 feet activity did not occur: Safety/medical concerns  Assist level: Minimal Assistance - Patient > 75% Assistive device: Walker-rolling    Walk 10 feet on uneven surface  activity   Assist Walk 10 feet on uneven surfaces activity did not occur: Safety/medical concerns         Wheelchair     Assist Is the patient using a wheelchair?: Yes Type of Wheelchair: Manual    Wheelchair assist level: Dependent - Patient 0% Max wheelchair distance: 12 ft    Wheelchair 50 feet with 2 turns activity    Assist    Wheelchair 50 feet with 2 turns activity did not occur: Safety/medical concerns        Wheelchair 150 feet activity     Assist  Wheelchair 150 feet activity did not occur: Safety/medical concerns       Blood pressure 131/68, pulse 73, temperature 97.9 F (36.6 C), resp. rate 18, height 6' (1.829 m), weight 88.8 kg, SpO2 95%. Medical Problem List and Plan: 1. 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  Ambulated 194 feet today!  continue Lovenox              -antiplatelet therapy: aspirin , plavix , recommended DAPT for 90 days followed by Plavix  75mg  daily monotherapy  3. Thrombocytosis: continue DAPT  4. Ischemic cardiomyopathy: continue lopressor   5. Impaired cognition: This patient is not capable of making decisions on his own behalf.  6. HTN: continue lopressor  -BP controlled, continue current regimen, add magnesium  gluconate 250mg  HS  4/20- BP is well controlled- con't regimen    11/26/2023    3:17 AM 11/25/2023    8:54 PM 11/25/2023    7:54 PM  Vitals with BMI  Systolic 131 131 161  Diastolic 68 82 67  Pulse 73 75 70    7.CAD s/p stent to LAD in 2012: continue aspirin  and plavix    8. Prediabetes: d/c ISS CBG (last 3)  No results for input(s): "GLUCAP" in the last 72 hours.   9. Delirium: resolved, d/c seroquel   10. Overweight: provide dietary education, d/c feeding supplement, vitamin D  and potassium ordered  11. Apneic episodes: referred for outpatient sleep study, O2 ordered HS.   12. AKI: Cr reviewed and has improved/resolved  13. Vitamin D  insufficiency: started D3 1,000U daily  14. Hyperkalemia: lokelma  ordered, K+ reviewed and has resolved  15. Constipation: milk of mag ordered  4/20- LBM 1.5 days ago- was large- con't regimen  LOS: 7 days A FACE TO FACE EVALUATION WAS PERFORMED  [name] [name] 11/26/2023, 9:27 AM

## 2023-11-26 NOTE — Progress Notes (Signed)
 Occupational Therapy Session Note  Patient Details  Name: [name] [name] MRN: [id number] Date of Birth: Sep 03, 1940  Today's Date: 11/26/2023 OT Individual Time: 5621-3086 OT Individual Time Calculation (min): 75 min    Short Term Goals: Week 1:  OT Short Term Goal 1 (Week 1): Patient will transition to standing from sitting with mod assist to aide in pulling up/down LB clothing OT Short Term Goal 2 (Week 1): Patient will don LB clothing with mod assist OT Short Term Goal 3 (Week 1): Patient will trasnfer to commode with mod assist OT Short Term Goal 4 (Week 1): Patient will correctly sequence steps of familiar functional activity - eg. wash before dress, socks before shoes without cueing.  Skilled Therapeutic Interventions/Progress Updates:    Patient in bed watching TV at the time of arrival. Patient indicated that he rested well and that he had know pain to report at the time of treatment. The pt was in agreement with completing a BADL related task in preparation for his day.   Patient was able to transfer from supine in bed to EOB with ModA.  The pt was able to complete a stand pivot transfer with the bed raised slightly to accommodate his needs in the home at ModA  for coming into standing and CGA for transferring. The pt was transported to the restroom and was able to doff his over head shirt with CGA to MinA.  The pt was able to come from sit to stand incorporating the grab bars for doffing his LB garments, which included his  briefs and his pants at MinA.  The pt was able to transfer onto the shower chair with ModA for adjusting his bottom. The pt was able to wash his UB and the upper portion of BLE, inclusive of private area with s/uA and initial cues, the pt was MaxA for his bottom and BLE, his chins and feet.  The pt was able to exit the shower by coming from sit to stand using the grab bars with MinA. The pt was able to transfer to the w/c with CGA to MinA for placement. The pt was  able to apply deodorant  s/uA and he was able to  donn his over head shirt with SBA. The pt was able to donn his brief with MaxA and he was ModA for donning his pant using a modified technique for opposite arm, opposite leg in relation to increase AROM. The pt was ModA for socks and shoes.  The pt was transported to the sink and was able to brush his dentures and rinse his mouth with s/u A.  The pt was able to drink his coffee and eat his breakfast with s/uA.  At the close of the session, the safety alarm was in place and the call light and bedside table were placed in front of the pt with all additional needs addressed.  Therapy Documentation Precautions:  Precautions Precautions: Fall Recall of Precautions/Restrictions: Impaired Precaution/Restrictions Comments: per chart, impulsive; on eval is lethargic/ medicated Restrictions Weight Bearing Restrictions Per Provider Order: No  Therapy/Group: Individual Therapy  [name] 11/26/2023, 12:56 PM

## 2023-11-26 NOTE — Progress Notes (Signed)
 Occupational Therapy Session Note  Patient Details  Name: [name] [name] MRN: [id number] Date of Birth: June 17, 1941  Today's Date: 11/26/2023 OT Individual Time: 1300-1345 OT Individual Time Calculation (min): 45 min    Short Term Goals: Week 1:  OT Short Term Goal 1 (Week 1): Patient will transition to standing from sitting with mod assist to aide in pulling up/down LB clothing OT Short Term Goal 2 (Week 1): Patient will don LB clothing with mod assist OT Short Term Goal 3 (Week 1): Patient will trasnfer to commode with mod assist OT Short Term Goal 4 (Week 1): Patient will correctly sequence steps of familiar functional activity - eg. wash before dress, socks before shoes without cueing.  Skilled Therapeutic Interventions/Progress Updates:      Therapy Documentation Precautions:  Precautions Precautions: Fall Recall of Precautions/Restrictions: Impaired Precaution/Restrictions Comments: per chart, impulsive; on eval is lethargic/ medicated Restrictions Weight Bearing Restrictions Per Provider Order: No General: "I appreciate you talking with me." Pt supine in bed upon OT arrival, agreeable to OT session.  Pain: no pain reported  ADL: OT providing skilled intervention for ADL retraining for toileting taak. Pt able to use RW and complete functional mobility to toilet at [facility], completing toileting tasks at [facility] after voiding urine, then ambulating back to W/C at [facility] with no LOB/SOB.  Other Treatments: OT providing skilled intervention with cognitive tasks such as sequencing, planning and handwriting. OT instructing pt on listing 5 items pt would purchase from grocery store. Pt writing 5 items without VC, although reporting handwriting much smaller than normal. OT then instructing pt to write 3 items with VC for direction to task. Pt became tearful during session d/t missing dog, OT providing motivational interviewing and encouragement and education on therapy dog visits.     Pt  seated in W/C at end of session with W/C alarm donned, call light within reach and 4Ps assessed.    Therapy/Group: Individual Therapy  Nila Barth, OTD, OTR/L 11/26/2023, 3:46 PM

## 2023-11-27 DIAGNOSIS — I63512 Cerebral infarction due to unspecified occlusion or stenosis of left middle cerebral artery: Secondary | ICD-10-CM | POA: Diagnosis not present

## 2023-11-27 MED ORDER — MAGNESIUM HYDROXIDE 400 MG/5ML PO SUSP
15.0000 mL | Freq: Once | ORAL | Status: AC
  Administered 2023-11-27: 15 mL via ORAL
  Filled 2023-11-27: qty 30

## 2023-11-27 NOTE — Plan of Care (Signed)
 Goals upgraded due to improved balance, motor processing and GM coordination   Problem: RH Bathing Goal: LTG Patient will bathe all body parts with assist levels (OT) Description: LTG: Patient will bathe all body parts with assist levels (OT) Flowsheets (Taken 11/27/2023 0800) LTG: Pt will perform bathing with assistance level/cueing: (upgraded due to improved balance, motor processing and GM coordination) Supervision/Verbal cueing   Problem: RH Dressing Goal: LTG Patient will perform lower body dressing w/assist (OT) Description: LTG: Patient will perform lower body dressing with assist, with/without cues in positioning using equipment (OT) Flowsheets (Taken 11/27/2023 0800) LTG: Pt will perform lower body dressing with assistance level of: (upgraded due to improved balance, motor processing and GM coordination) Supervision/Verbal cueing   Problem: RH Tub/Shower Transfers Goal: LTG Patient will perform tub/shower transfers w/assist (OT) Description: LTG: Patient will perform tub/shower transfers with assist, with/without cues using equipment (OT) Flowsheets (Taken 11/27/2023 0800) LTG: Pt will perform tub/shower stall transfers with assistance level of: (upgraded due to improved balance, motor processing and GM coordination) Contact Guard/Touching assist   Problem: RH Attention Goal: LTG Patient will demonstrate this level of attention during functional activites (OT) Description: LTG:  Patient will demonstrate this level of attention during functional activites  (OT) Flowsheets (Taken 11/27/2023 0800) LTG: Patient will demonstrate this level of attention during functional activites (OT): (upgraded due to improved balance, motor processing and GM coordination) Supervision

## 2023-11-27 NOTE — Progress Notes (Signed)
 PROGRESS NOTE   Subjective/Complaints: No new complaints this morning Sleeping better with trazodone  Speech improved, discussed why we started amantadine    ROS:   Pt denies SOB, abd pain, CP, N/V/C/D, and vision changes insomnia improved    Objective:   No results found. No results for input(s): "WBC", "HGB", "HCT", "PLT" in the last 72 hours.  No results for input(s): "NA", "K", "CL", "CO2", "GLUCOSE", "BUN", "CREATININE", "CALCIUM " in the last 72 hours.   Intake/Output Summary (Last 24 hours) at 11/27/2023 1047 Last data filed at 11/27/2023 0829 Gross per 24 hour  Intake 720 ml  Output --  Net 720 ml        Physical Exam: Vital Signs Blood pressure 130/74, pulse 69, temperature 98.1 F (36.7 C), temperature source Oral, resp. rate 16, height 6' (1.829 m), weight 88.8 kg, SpO2 94%.    General: awake, alert, appropriate, supine in bed- just woke him up; NAD HENT: conjugate gaze; oropharynx moist CV: regular rate and rhythm; no JVD Pulmonary: CTA B/L; no W/R/R- good air movement GI: soft, NT, ND, (+)BS- normoactive Psychiatric: appropriate- flat Neurological: alert, but sleepy Skin: intact Neuro: Alert and oriented x3 (to month, note date), slow processing, oriented to person only Musculoskeletal: 3/5 strength on right side Stable 4/19  Assessment/Plan: 1. Functional deficits which require 3+ hours per day of interdisciplinary therapy in a comprehensive inpatient rehab setting. Physiatrist is providing close team supervision and 24 hour management of active medical problems listed below. Physiatrist and rehab team continue to assess barriers to discharge/monitor patient progress toward functional and medical goals  Care Tool:  Bathing    Body parts bathed by patient: Right arm, Left arm, Chest, Abdomen, Front perineal area, Right upper leg, Left upper leg, Face, Right lower leg, Left lower leg   Body  parts bathed by helper: Buttocks     Bathing assist Assist Level: Minimal Assistance - Patient > 75%     Upper Body Dressing/Undressing Upper body dressing   What is the patient wearing?: Pull over shirt    Upper body assist Assist Level: Supervision/Verbal cueing    Lower Body Dressing/Undressing Lower body dressing      What is the patient wearing?: Incontinence brief, Pants     Lower body assist Assist for lower body dressing: Moderate Assistance - Patient 50 - 74%     Toileting Toileting Toileting Activity did not occur (Clothing management and hygiene only): N/A (no void or bm)  Toileting assist Assist for toileting: Moderate Assistance - Patient 50 - 74%     Transfers Chair/bed transfer  Transfers assist     Chair/bed transfer assist level: Minimal Assistance - Patient > 75%     Locomotion Ambulation   Ambulation assist      Assist level: Minimal Assistance - Patient > 75% Assistive device: Walker-rolling Max distance: 457   Walk 10 feet activity   Assist     Assist level: Minimal Assistance - Patient > 75% Assistive device: Walker-rolling   Walk 50 feet activity   Assist Walk 50 feet with 2 turns activity did not occur: Safety/medical concerns  Assist level: Minimal Assistance - Patient > 75% Assistive device: Walker-rolling  Walk 150 feet activity   Assist Walk 150 feet activity did not occur: Safety/medical concerns  Assist level: Minimal Assistance - Patient > 75% Assistive device: Walker-rolling    Walk 10 feet on uneven surface  activity   Assist Walk 10 feet on uneven surfaces activity did not occur: Safety/medical concerns         Wheelchair     Assist Is the patient using a wheelchair?: Yes Type of Wheelchair: Manual    Wheelchair assist level: Dependent - Patient 0% Max wheelchair distance: 12 ft    Wheelchair 50 feet with 2 turns activity    Assist    Wheelchair 50 feet with 2 turns activity  did not occur: Safety/medical concerns       Wheelchair 150 feet activity     Assist  Wheelchair 150 feet activity did not occur: Safety/medical concerns       Blood pressure 130/74, pulse 69, temperature 98.1 F (36.7 C), temperature source Oral, resp. rate 16, height 6' (1.829 m), weight 88.8 kg, SpO2 94%. Medical Problem List and Plan: 1. 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  Ambulated 194 feet today!  continue Lovenox              -antiplatelet therapy: aspirin , plavix , recommended DAPT for 90 days followed by Plavix  75mg  daily monotherapy  3. Thrombocytosis: continue DAPT  4. Ischemic cardiomyopathy: continue lopressor   5. Impaired cognition: This patient is not capable of making decisions on his own behalf.  6. HTN: continue lopressor  -BP controlled, continue current regimen, add magnesium  gluconate 250mg  HS  4/20- BP is well controlled- con't regimen    11/27/2023    9:32 AM 11/27/2023    3:51 AM 11/26/2023    7:26 PM  Vitals with BMI  Systolic 130 131 161  Diastolic 74 71 97  Pulse 69 71 91    7.CAD s/p stent to LAD in 2012: continue aspirin  and plavix    8. Prediabetes: d/c ISS CBG (last 3)  No results for input(s): "GLUCAP" in the last 72 hours.   9. Delirium: resolved, d/c seroquel   10. Overweight: provide dietary education, d/c feeding supplement, vitamin D  and potassium ordered  11. Apneic episodes: referred for outpatient sleep study, O2 ordered HS.   12. AKI: Cr reviewed and has improved/resolved  13. Vitamin D  insufficiency: started D3 1,000U daily, continue  14. Hyperkalemia: lokelma  ordered, K+ reviewed and has resolved  15. 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 Insomnia: trazodone  started  LOS: 8 days A FACE TO FACE EVALUATION  WAS PERFORMED  [name] [name] 11/27/2023, 10:47 AM

## 2023-11-27 NOTE — Progress Notes (Addendum)
 Speech Language Pathology Daily Session Note  Patient Details  Name: [name] [name] MRN: [id number] Date of Birth: [birth year]  Today's Date: 11/27/2023 SLP Individual Time: 1100-1200 SLP Individual Time Calculation (min): 60 min  Short Term Goals: Week 1: SLP Short Term Goal 1 (Week 1): Pt will participate in a structured eval and/or diagnostic tx tasks to determine need for further dysphagia intervention SLP Short Term Goal 2 (Week 1): Pt will complete EMST w/ modA to facilitate increased breath support during speech production SLP Short Term Goal 3 (Week 1): Pt will recall compensatory speech strategies in 3/4 opportunities given minA SLP Short Term Goal 4 (Week 1): Pt will utilize compensatory speech strategies at phrase level to maintain 50% intelligibility or greater given modA SLP Short Term Goal 5 (Week 1): Pt will complete mildly specific word finding tasks w/ supervisionA SLP Short Term Goal 6 (Week 1): Pt will consume regular diet and thin liquids with minimal to no s/s aspiration provided sup A cues for safe swallow strategies as trained  Skilled Therapeutic Interventions:   Pt and daughter greeted at bedside. He was awake upon SLP arrival, though appeared fatigued after PT tx session. SLP facilitated tx tasks targeting speech production and word finding. Working memory targeted indirectly during naming tasks. He initially completed 25 reps of EMST @ ~25-30 cmH2O. He benefited from only s cues to maintain optimal technique/timing. He then completed 25 reps via IOPI to target lingual strengthening: 10 reps completed in the anterior position @ 26 kpa and 15 reps in the posterior position @ 22 kpa. After, he completed a category ID and naming task (concrete and abstract) w/ minA for word finding and s cues for working memory. Despite fatigue, he required only minA for over articulation and modA to increase vocal intensity during one word responses as well as phrase-sentence length  responses in conversation. Additionally, he was able to recall 3/4 compensatory speech strategies w/ only minA. At the end of tx tasks, he was left in bed with his daughter present and call light within reach. Recommend cont ST.   Pain  No pain reported  Therapy/Group: Individual Therapy  Rozell Cornet 11/27/2023, 5:01 PM

## 2023-11-27 NOTE — Progress Notes (Signed)
 Occupational Therapy Weekly Progress Note  Patient Details  Name: [name] [name] MRN: [id number] Date of Birth: 11-19-1940  Beginning of progress report period: November 20, 2023 End of progress report period: November 27, 2023   Patient has met 4 of 4 short term goals. Pt is making great progress towards LTGs. He is able to bathe at an overall min A level, dress with min A and requires mod assist for toileting tasks. Pt continues to demonstrate dynamic balance, GM coordination, and cognitive deficits, with delayed processing and cues required for task progression resulting in difficulty completing BADL tasks without increased physical assist. Pt will benefit from continued skilled OT services to focus on mentioned deficits. Family ed not initiated as of date and will be needed in prep for DC.   Patient continues to demonstrate the following deficits: muscle weakness, decreased cardiorespiratoy endurance, impaired timing and sequencing and decreased coordination, decreased initiation, decreased awareness, decreased problem solving, decreased memory, and delayed processing, and decreased sitting balance and decreased standing balance and therefore will continue to benefit from skilled OT intervention to enhance overall performance with BADL and Reduce care partner burden.  Patient progressing toward long term goals..  Plan of care revisions: min A goals upgraded to supervision.  OT Short Term Goals Week 1:  OT Short Term Goal 1 (Week 1): Patient will transition to standing from sitting with mod assist to aide in pulling up/down LB clothing OT Short Term Goal 1 - Progress (Week 1): Met OT Short Term Goal 2 (Week 1): Patient will don LB clothing with mod assist OT Short Term Goal 2 - Progress (Week 1): Met OT Short Term Goal 3 (Week 1): Patient will trasnfer to commode with mod assist OT Short Term Goal 3 - Progress (Week 1): Met OT Short Term Goal 4 (Week 1): Patient will correctly sequence steps  of familiar functional activity - eg. wash before dress, socks before shoes without cueing. OT Short Term Goal 4 - Progress (Week 1): Met Week 2:  OT Short Term Goal 1 (Week 2): Pt will stand for > 3 mins at sink during ADL task with CGA OT Short Term Goal 2 (Week 2): Pt will complete 2/3 toileting steps with CGA for balance OT Short Term Goal 3 (Week 2): Pt will initiate toileting during session with min questioning cues   Ruthanna Covert, MS, OTR/L  11/27/2023, 7:56 AM

## 2023-11-27 NOTE — Progress Notes (Signed)
 Physical Therapy Session Note  Patient Details  Name: [name] [name] MRN: [id number] Date of Birth: March 22, 1941  Today's Date: 11/27/2023 PT Individual Time: 1000-1056 PT Individual Time Calculation (min): 56 min   Short Term Goals: Week 2:  PT Short Term Goal 1 (Week 2): pt will perform bed mobility with CGA consistantly PT Short Term Goal 2 (Week 2): pt will perform bed<>chair transfers with LRAD and CGA PT Short Term Goal 3 (Week 2): pt will ambulate 31ft with LRAD and CGA  Skilled Therapeutic Interventions/Progress Updates:      Pt sitting in wheelchair on arrival and is agreeable to therapy treatment. He has no complaints of pain. Sit<>stand to RW needing minA and cues for hand placement to push from arm rests. He ambulates with light minA and RW from his room to main gym, ~145ft. Cues for lengthening step and stride length. Increased instability with turns compared to with straight path ambulation.   Timed up and Go completed using RW: 43 seconds *scores > 13.5 seconds indicate increased falls risk.   Five times Sit to stand completed from low mat table (pt requiring light minA for powering to rise without UE support and hands crossed over chest): 51 seconds *Normal performance value for 80-89 years: 14.8 seconds  6 minute walk test: 47ft with RW - reports 7/10 on RPE scale.  Gait speed = 0.38 m/s - indicative of household ambulator and increased falls risk.   Pt instructed in stair training using 6" steps and 2 hand rails. Pt navigated up/down a total of x12 steps with light minA with a forward facing, step to pattern. MinA for general stability and safety.  Ambulated back to his room with CGA and RW ~112ft with similar cues as above for lengthening step and stride as well as keeping body within walker frame. Pt requesting to lie down in bed 2/2 fatigue. Able to position himself with cues in bed. Provided ice water per his request. All needs met at end.    Therapy  Documentation Precautions:  Precautions Precautions: Fall Recall of Precautions/Restrictions: Impaired Precaution/Restrictions Comments: per chart, impulsive; on eval is lethargic/ medicated Restrictions Weight Bearing Restrictions Per Provider Order: No General:     Therapy/Group: Individual Therapy  Pheobe Brass 11/27/2023, 7:50 AM

## 2023-11-27 NOTE — Progress Notes (Signed)
 Occupational Therapy Session Note  Patient Details  Name: [name] [name] MRN: [id number] Date of Birth: 1940/08/18  Today's Date: 11/27/2023 OT Individual Time: 4098-1191 & 1435-1530 OT Individual Time Calculation (min): 40 min & 55 min   Short Term Goals: Week 1:  OT Short Term Goal 1 (Week 1): Patient will transition to standing from sitting with mod assist to aide in pulling up/down LB clothing OT Short Term Goal 2 (Week 1): Patient will don LB clothing with mod assist OT Short Term Goal 3 (Week 1): Patient will trasnfer to commode with mod assist OT Short Term Goal 4 (Week 1): Patient will correctly sequence steps of familiar functional activity - eg. wash before dress, socks before shoes without cueing.  Skilled Therapeutic Interventions/Progress Updates:  Session 1 Skilled OT intervention completed with focus on ADL retraining, functional ambulation and cardiovascular endurance. Pt received upright in bed, agreeable to session. No pain reported.  With min questioning cues, pt initiated going to bathroom prior to leaving room. Transitioned semi supine to sit with supervision with heavy reliance on bed rails. Pt required increased time and cues to scoot forward for feet to contact floor to correct posterior bias this session. Intermittent HHA needed to obtain upright trunk. Donned slip on shoes with max A. From elevated surface, pt stood with CGA using RW, then ambulated > with CGA and RW > BSC over toilet. Min A needed for lowering clothing. Pt continent of urinary void and slightly incontinent of small BM smear. Pt able to complete pericare in stance with CGA, however required mod A to donn brief/pants over hips. Ambulated with CGA and RW > sink for standing hand hygiene with close supervision.   Pt ambulated 125 ft > gym using RW and CGA with intermittent cues for stride length and upright gaze. Extended rest offered for fatigue. Then ambulated 150 ft using RW with CGA, with pt  retrieving squigz from L and R eye level areas on wall. Pt required mod cues for attending to items on L side, with pt missing several, but improved to min/supervision for task with repetition. Pt with continued need of cueing for gait pattern. Back in room, pt remained seated in w/c with daughter present and with all needs in reach at end of session.  Session 2 Skilled OT intervention completed with focus on dynamic standing balance, L attention/NMR. Pt received seated in w/c, agreeable to session. No pain reported.  Pt declined self-care needs and shower offer. Transported dependently in w/c > gym. CGA sit > stand and stand pivot with RW > EOM. Son returned home measurement sheet requested, with varying heights indicated from 22" to 65" for common places pt will use at home. From mat at 22", pt was able to stand using one hand on mat and other on RW to push up to stand with close supervision without difficulty. Discussed how pt's 22" bed should be appropriate for him. However, with mat at 16" to simulate standard toilet, pt was unable to stand without a push up arm rest like on BSC, therefore discussed getting a BSC for placement over top to increase independence with toileting needs. CSW notified.  Pt participated in the following dynamic standing balance and endurance tasks to promote independence and safety during BADLs and functional mobility: -placing and retrieving clips from theraband on long mirror. CGA sit > stand without AD, and close supervision needed for balance; utilized L hand only for reaching in PNF pattern for NMR  Seated EOM, pt  completed sliding pegboard design. Pt was able to complete with initial min A fading to supervision, demonstrating adequate problem solving and sequencing skills. Supervision sit > stand and stand pivot with RW > w/c. Back in room, OT advised daughter of BSC need and she reported she had one for him to use. Pt remained seated in w/c, with family present and  with all needs in reach at end of session.   Therapy Documentation Precautions:  Precautions Precautions: Fall Recall of Precautions/Restrictions: Impaired Precaution/Restrictions Comments: per chart, impulsive; on eval is lethargic/ medicated Restrictions Weight Bearing Restrictions Per Provider Order: No    Therapy/Group: Individual Therapy  Ruthanna Covert, MS, OTR/L  11/27/2023, 3:35 PM

## 2023-11-28 DIAGNOSIS — I63512 Cerebral infarction due to unspecified occlusion or stenosis of left middle cerebral artery: Secondary | ICD-10-CM | POA: Diagnosis not present

## 2023-11-28 MED ORDER — MAGNESIUM HYDROXIDE 400 MG/5ML PO SUSP: 30.0000 mL | Freq: Once | ORAL | Status: DC

## 2023-11-28 MED ORDER — MAGNESIUM GLUCONATE 500 (27 MG) MG PO TABS
500.0000 mg | ORAL_TABLET | Freq: Every day | ORAL | Status: DC
  Administered 2023-11-28 – 2023-12-05 (×8): 500 mg via ORAL
  Filled 2023-11-28 (×8): qty 1

## 2023-11-28 NOTE — Progress Notes (Signed)
 Physical Therapy Session Note  Patient Details  Name: [name] [name] MRN: [id number] Date of Birth: 1941/07/31  Today's Date: 11/28/2023 PT Individual Time: 4098-1191 PT Individual Time Calculation (min): 70 min   Short Term Goals: Week 1:  PT Short Term Goal 1 (Week 1): Pt will perform bed mobility with overall MinA. PT Short Term Goal 1 - Progress (Week 1): Met PT Short Term Goal 2 (Week 1): Pt will improve sitting balance with increased ability to produce forward lean with decreased vc. PT Short Term Goal 2 - Progress (Week 1): Met PT Short Term Goal 3 (Week 1): Pt will perform sit <>stand with MinA. PT Short Term Goal 3 - Progress (Week 1): Met PT Short Term Goal 4 (Week 1): Pt will perform stand pivot with MinA. PT Short Term Goal 4 - Progress (Week 1): Met PT Short Term Goal 5 (Week 1): Pt will ambulate at least 50 ft using LRAD with MinA. PT Short Term Goal 5 - Progress (Week 1): Met Week 2:  PT Short Term Goal 1 (Week 2): pt will perform bed mobility with CGA consistantly PT Short Term Goal 2 (Week 2): pt will perform bed<>chair transfers with LRAD and CGA PT Short Term Goal 3 (Week 2): pt will ambulate 94ft with LRAD and CGA  Skilled Therapeutic Interventions/Progress Updates:   Received pt sitting in WC, pt agreeable to PT treatment, and denied any pain during session. Pt reported not receiving breakfast - located breakfast tray and set pt up to eat breakfast. Provided full supervision; noted pt with good recall of instructions to take small sips and bites and not rushing, allowing time to chew bites completely -  minimal coughing noted. While eating, discussed D/C date of 4/30, pt wishing it was "tomorrow" reporting missing his dog. Informed pt of hospital pet policies and offered paperwork to complete to have family bring his dog to unit but pt stating it's too much trouble. Encouraged having daughter and son in law bring dog and family bring pt outside to see his dog -  requested grounds pass from MD.   RN present to adminster medications and discussed equipment for D/C. Pt reported using rollator prior to admission but does not have RW - placed order request for one. Pt stood with RW and light min A with cues to push up from [facility] armrests. Pt began ambulating to main therapy gym but then reported urge to have BM and turned around (~130ft) and ambulated into bathroom with RW and CGA. Pt able to manage clothing standing with CGA but unsuccessful with BM. Stood from bedside commode with RW and CGA and performed hygiene management in standing with CGA. Required min A for clothing management, then stood at sink to perform hand hygiene with supervision.   Pt ambulated 14ft x 2 trials with RW and CGA to/from dayroom. Pt performed x 4 sit<>stands from standard chair without armrests using RW and min A with cues for hand placement and anterior weight shifting. Pt performed the following exercises with emphasis on LE strength/ROM: -alternating marches 2x10 bilaterally with 1.5lb ankle weight -heel raises 2x12 -hip abduction 2x10 bilaterally with 1.5lb ankle weight  Returned to room and concluded session with pt sitting in recliner, needs within reach, and chair pad alarm on.   Therapy Documentation Precautions:  Precautions Precautions: Fall Recall of Precautions/Restrictions: Impaired Precaution/Restrictions Comments: per chart, impulsive; on eval is lethargic/ medicated Restrictions Weight Bearing Restrictions Per Provider Order: No  Therapy/Group: Individual Therapy [name]  [name] PT, DPT 11/28/2023, 6:53 AM

## 2023-11-28 NOTE — Progress Notes (Signed)
 Occupational Therapy Session Note  Patient Details  Name: [name] [name] MRN: [id number] Date of Birth: 06-Apr-1941  Today's Date: 11/28/2023 OT Individual Time: 1119-1200 OT Individual Time Calculation (min): 41 min    Short Term Goals: Week 2:  OT Short Term Goal 1 (Week 2): Pt will stand for > 3 mins at sink during ADL task with CGA OT Short Term Goal 2 (Week 2): Pt will complete 2/3 toileting steps with CGA for balance OT Short Term Goal 3 (Week 2): Pt will initiate toileting during session with min questioning cues  Skilled Therapeutic Interventions/Progress Updates:     Pt received sitting up in recliner, dressed and ready for the day upon OT arrival. Pt presenting with flat affect, however to be in good spirits receptive to skilled OT session reporting 0/10 pain- OT offering intermittent rest breaks, repositioning, and therapeutic support to optimize participation in therapy session. Pt's DTR present upon OT arrival-spent time discussing Pt's PLOF and POC. Pt's DTR reporting Pt was mod I with BADLs and basic mobility using RW prior to hospital admission. Focused this session on functional cognition and activity tolerance.   Sit > stand from recliner to RW CGA with verbal cues required for technique and to facilitate increased anterior weight shifting. Pt completed functional mobility to wc ~58ft using RW with light min A provided for balance and RW management.   Transported Pt total A to therapy gym in wc for time management and energy conservation.   Engaged Pt in completing series of activities on BITs system in busy rehab gym to work on selective attention, standing tolerance, and dual tasking.  -Visual Scanning > Complex Array > Sequence: Pt tasked with maintaining standing balance using RW while utilizing R UE to select letters A-Z in order. Pt able to complete task with 100% accuracy with min questioning cues required to recall most recent letter that was selected. Pt tolerated  standing 7 minutes total CGA with short seated rest break provided half way during task.  -Cognitive > Memory: Pt tasked with recalling sequencing of pictures (up to 4 items) following flash time of 10 sec while maintaining standing balance using RW and utilizing R UE to select items in order. Pt able to complete activity with 100% accuracy completing total of 11 trials, tolerating standing for 4 minutes during activity. Increased time required for processing speed and initiation.   Transported Pt back to room total A in wc. Pt was left resting in wc with call bell in reach, DTR present in room, and all needs met.    Therapy Documentation Precautions:  Precautions Precautions: Fall Recall of Precautions/Restrictions: Impaired Precaution/Restrictions Comments: per chart, impulsive; on eval is lethargic/ medicated Restrictions Weight Bearing Restrictions Per Provider Order: No   Therapy/Group: Individual Therapy  Geoffery Kiel 11/28/2023, 7:53 AM

## 2023-11-28 NOTE — Progress Notes (Signed)
 PROGRESS NOTE   Subjective/Complaints: No new complaints this morning States he was unable to have BM yesterday, asked Autumn to bring him prune juice   ROS:   Pt denies SOB, abd pain insomnia improved +constipation    Objective:   No results found. No results for input(s): "WBC", "HGB", "HCT", "PLT" in the last 72 hours.  No results for input(s): "NA", "K", "CL", "CO2", "GLUCOSE", "BUN", "CREATININE", "CALCIUM " in the last 72 hours.   Intake/Output Summary (Last 24 hours) at 11/28/2023 1010 Last data filed at 11/28/2023 0830 Gross per 24 hour  Intake 440 ml  Output --  Net 440 ml        Physical Exam: Vital Signs Blood pressure (!) 153/79, pulse 71, temperature 98.9 F (37.2 C), resp. rate 16, height 6' (1.829 m), weight 88.8 kg, SpO2 95%.    General: awake, alert, appropriate, supine in bed- just woke him up; NAD HENT: conjugate gaze; oropharynx moist CV: regular rate and rhythm; no JVD Pulmonary: CTA B/L; no W/R/R- good air movement GI: soft, NT, ND, (+)BS- normoactive Psychiatric: appropriate- flat Neurological: alert, but sleepy Skin: intact Neuro: Alert and oriented x3 (to month, note date), slow processing, oriented to person only Musculoskeletal: 3/5 strength on right side Stable 4/22  Assessment/Plan: 1. Functional deficits which require 3+ hours per day of interdisciplinary therapy in a comprehensive inpatient rehab setting. Physiatrist is providing close team supervision and 24 hour management of active medical problems listed below. Physiatrist and rehab team continue to assess barriers to discharge/monitor patient progress toward functional and medical goals  Care Tool:  Bathing    Body parts bathed by patient: Right arm, Left arm, Chest, Abdomen, Front perineal area, Right upper leg, Left upper leg, Face, Right lower leg, Left lower leg   Body parts bathed by helper: Buttocks      Bathing assist Assist Level: Minimal Assistance - Patient > 75%     Upper Body Dressing/Undressing Upper body dressing   What is the patient wearing?: Pull over shirt    Upper body assist Assist Level: Supervision/Verbal cueing    Lower Body Dressing/Undressing Lower body dressing      What is the patient wearing?: Incontinence brief, Pants     Lower body assist Assist for lower body dressing: Moderate Assistance - Patient 50 - 74%     Toileting Toileting Toileting Activity did not occur (Clothing management and hygiene only): N/A (no void or bm)  Toileting assist Assist for toileting: Moderate Assistance - Patient 50 - 74%     Transfers Chair/bed transfer  Transfers assist     Chair/bed transfer assist level: Minimal Assistance - Patient > 75%     Locomotion Ambulation   Ambulation assist      Assist level: Minimal Assistance - Patient > 75% Assistive device: Walker-rolling Max distance: 457   Walk 10 feet activity   Assist     Assist level: Minimal Assistance - Patient > 75% Assistive device: Walker-rolling   Walk 50 feet activity   Assist Walk 50 feet with 2 turns activity did not occur: Safety/medical concerns  Assist level: Minimal Assistance - Patient > 75% Assistive device: Walker-rolling  Walk 150 feet activity   Assist Walk 150 feet activity did not occur: Safety/medical concerns  Assist level: Minimal Assistance - Patient > 75% Assistive device: Walker-rolling    Walk 10 feet on uneven surface  activity   Assist Walk 10 feet on uneven surfaces activity did not occur: Safety/medical concerns         Wheelchair     Assist Is the patient using a wheelchair?: Yes Type of Wheelchair: Manual    Wheelchair assist level: Dependent - Patient 0% Max wheelchair distance: 12 ft    Wheelchair 50 feet with 2 turns activity    Assist    Wheelchair 50 feet with 2 turns activity did not occur: Safety/medical  concerns       Wheelchair 150 feet activity     Assist  Wheelchair 150 feet activity did not occur: Safety/medical concerns       Blood pressure (!) 153/79, pulse 71, temperature 98.9 F (37.2 C), resp. rate 16, height 6' (1.829 m), weight 88.8 kg, SpO2 95%. Medical Problem List and Plan: 1. 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  Ambulated 194 feet today!  continue Lovenox              -antiplatelet therapy: aspirin , plavix , recommended DAPT for 90 days followed by Plavix  75mg  daily monotherapy  3. Thrombocytosis: continue DAPT  4. Ischemic cardiomyopathy: continue lopressor   5. Impaired cognition: This patient is not capable of making decisions on his own behalf.  6. HTN: continue lopressor  -BP controlled, continue current regimen, increase magnesium  gluconate to 500mg        11/28/2023    4:25 AM 11/27/2023    8:13 PM 11/27/2023    6:54 PM  Vitals with BMI  Systolic 153 154 829  Diastolic 79 77 77  Pulse 71 72 79    7.CAD s/p stent to LAD in 2012: continue aspirin  and plavix    8. Prediabetes: d/c ISS CBG (last 3)  No results for input(s): "GLUCAP" in the last 72 hours.   9. Delirium: resolved, d/c seroquel   10. Overweight: provide dietary education, d/c feeding supplement, vitamin D  and potassium ordered  11. Apneic episodes: referred for outpatient sleep study, O2 ordered HS.   12. AKI: Cr reviewed and has improved/resolved  13. Vitamin D  insufficiency: started D3 1,000U daily, continue  14. Hyperkalemia: lokelma  ordered, K+ reviewed and has resolved  15. 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 Insomnia: trazodone  started, continue  LOS: 9 days A FACE TO FACE EVALUATION WAS PERFORMED  [name] [name] 11/28/2023,  10:10 AM

## 2023-11-28 NOTE — Progress Notes (Signed)
 Speech Language Pathology Weekly Progress and Session Note  Patient Details  Name: [name] [name] MRN: [id number] Date of Birth: 1941/05/25  Beginning of progress report period: November 20, 2023 End of progress report period: November 27, 2023  Today's Date: 11/28/2023 SLP Individual Time: 1300-1400 SLP Individual Time Calculation (min): 60 min  Short Term Goals: Week 1: SLP Short Term Goal 1 (Week 1): Pt will participate in a structured eval and/or diagnostic tx tasks to determine need for further dysphagia intervention SLP Short Term Goal 1 - Progress (Week 1): Met SLP Short Term Goal 2 (Week 1): Pt will complete EMST w/ modA to facilitate increased breath support during speech production SLP Short Term Goal 2 - Progress (Week 1): Met SLP Short Term Goal 3 (Week 1): Pt will recall compensatory speech strategies in 3/4 opportunities given minA SLP Short Term Goal 3 - Progress (Week 1): Met SLP Short Term Goal 4 (Week 1): Pt will utilize compensatory speech strategies at phrase level to maintain 50% intelligibility or greater given modA SLP Short Term Goal 4 - Progress (Week 1): Met SLP Short Term Goal 5 (Week 1): Pt will complete mildly specific word finding tasks w/ supervisionA SLP Short Term Goal 5 - Progress (Week 1): Met SLP Short Term Goal 6 (Week 1): Pt will consume regular diet and thin liquids with minimal to no s/s aspiration provided sup A cues for safe swallow strategies as trained SLP Short Term Goal 6 - Progress (Week 1): Met    New Short Term Goals: Week 2: SLP Short Term Goal 1 (Week 2): STG = LTG due to ELOS  Weekly Progress Updates: Pt has made excellent gains and has met 6 of 6 STGs this reporting period due to improved communication and dysphagia. Currently, patient continues to require min-mod A for recall and use of speech intelligibility strategies to reach 80% intelligibility at the sentence level.  Patient is tolerating a regular/thin diet with no s/sx of  aspiration and requires supervision A for use of compensatory strategies. Pt/family eduction ongoing. Pt would benefit from continued ST intervention to maximize communication and swallowing safety in order to maximize functional independence at d/c.    Intensity: Minumum of 1-2 x/day, 30 to 90 minutes Frequency: 3 to 5 out of 7 days Duration/Length of Stay: 4/30 Treatment/Interventions: Dysphagia/aspiration precaution training;Internal/external aids;Speech/Language facilitation;Therapeutic Activities;Oral motor exercises;Environmental controls;Cueing hierarchy;Functional tasks;Patient/family education;Multimodal communication approach;Therapeutic Exercise   Daily Session  Skilled Therapeutic Interventions: Skilled therapy session focused on communication goals. SLP faciliatated session by reassessing patients maxmium expiratory pressure and anterior/posterior lingual strength. Patient with an average MEP of 35.8cm H2O, therefore EMST increased to 30cm H2O for 25 repetitions. Patients anterior lingual strength increased to 42.4 and posterior increased to 44.6 kpa, a significant increase from evaluation. SLP initiated use of IOPI set at 35 kpa anteriorly and posteriorly. Patient completed x10 repetitions at each location. Patient continues to present with low vocal quality and imprecise articulation, though given minA, reached 80% intelligibility at the sentence level. At the end of the session, patient recalled 4/4 speech strategies given minA. Patient left in chair with alarm set and call bell in reach. Continue POC      Pain Denies  Therapy/Group: Individual Therapy  Betul Brisky M.A., CCC-SLP 11/28/2023, 2:09 PM

## 2023-11-28 NOTE — Progress Notes (Signed)
 Occupational Therapy Session Note  Patient Details  Name: [name] [name] MRN: [id number] Date of Birth: 12-13-1940  Today's Date: 11/28/2023 OT Individual Time: 1035-1100 & 1450-1533 OT Individual Time Calculation (min): 25 min & 43 min   Short Term Goals: Week 2:  OT Short Term Goal 1 (Week 2): Pt will stand for > 3 mins at sink during ADL task with CGA OT Short Term Goal 2 (Week 2): Pt will complete 2/3 toileting steps with CGA for balance OT Short Term Goal 3 (Week 2): Pt will initiate toileting during session with min questioning cues  Skilled Therapeutic Interventions/Progress Updates:  Session 1 Skilled OT intervention completed with focus on ambulatory endurance, BUE strengthening. Pt received seated in recliner, agreeable to session. No pain reported.  Pt declined self-care needs. Pt completed all sit > stands and ambulatory transfers with supervision using RW. Pt stood initially, with dropping of brief/pants below waist. Education provided to pt and daughter in room about use of depends and to trial them during session as toileting steps do change with incontinence when using this kind.   Pt ambulated about 150 ft > gym with cues needed to avoid leaning on RW and maintaining proximity of RW to body. Pt inquired use of rollator, with plan to trial in PM session, however discussed need of correcting his anterior lean for safety if using rollator by DC.  Seated EOM, pt completed the following BUE exercises to promote BUE strength/endurance needed for independence with functional transfers and BADLs: (With 5 lb dowel) -2x12 chest press -2x12 bicep curls  Ambulated back to room, then remained seated in recliner, with daughter present and with all needs in reach at end of session.  Session 2 Skilled OT intervention completed with focus on ADL retraining, ambulatory endurance, and adaptive equipment education. Pt received seated in w/c, agreeable to session. No pain  reported.  Pt initiated need to use bathroom without cues. Pt completed sit > stand and ambulatory transfer using RW > to [facility] over toilet. Pt was supervision for 3/3 toileting steps with intermittent cueing for sequencing especially when reaching to floor to retrieve LB clothing. Pt continent of urinary void only, however was able to complete pericare in stance for ensuring cleanliness. Ambulated to sink with cues for safety over decline threshold out of bathroom and with good recall to place RW in front of him for hand hygiene in standing.  Ambulated about 150 ft using RW with supervision > gym. Discussed with pt his desire to use rollator vs RW at home as he has this already and most familiar and preferable to it. Education provided on the difference between RW and rollator features and also discussed importance of avoiding anterior lean and ensuring BLE clearance with rollator. Retrieved rollator and adjusted bilateral handles to pt's height. Pt ambulated 150 ft using rollator with close supervision, with initial min cues for sequencing locking of brakes, however faded to supervision by end of walking trial. Education provided on backing rollator up to wall prior to sitting for rest break. Ambulated additional 100 ft with rollator and supervision back to room. At EOB, transitioned sit > supine with supervision. Pt remained semi upright in bed, with bed alarm on/activated, and with all needs in reach at end of session.   Therapy Documentation Precautions:  Precautions Precautions: Fall Recall of Precautions/Restrictions: Impaired Precaution/Restrictions Comments: per chart, impulsive; on eval is lethargic/ medicated Restrictions Weight Bearing Restrictions Per Provider Order: No    Therapy/Group: Individual Therapy  Colburn Asper  Thora Flint, MS, OTR/L  11/28/2023, 4:00 PM

## 2023-11-29 MED ORDER — VITAMIN D 25 MCG (1000 UNIT) PO TABS
2000.0000 [IU] | ORAL_TABLET | Freq: Every day | ORAL | Status: DC
  Administered 2023-11-30 – 2023-12-06 (×7): 2000 [IU] via ORAL
  Filled 2023-11-29 (×7): qty 2

## 2023-11-29 MED ORDER — ENOXAPARIN SODIUM 30 MG/0.3ML IJ SOSY
30.0000 mg | PREFILLED_SYRINGE | INTRAMUSCULAR | Status: DC
  Administered 2023-11-29: 30 mg via SUBCUTANEOUS
  Filled 2023-11-29: qty 0.3

## 2023-11-29 MED ORDER — SORBITOL 70 % SOLN
30.0000 mL | Freq: Once | Status: AC
Start: 2023-11-29 — End: 2023-11-29
  Administered 2023-11-29: 30 mL via ORAL
  Filled 2023-11-29: qty 30

## 2023-11-29 NOTE — Patient Care Conference (Addendum)
 Inpatient RehabilitationTeam Conference and Plan of Care Update Date: 11/29/2023   Time: 11:31 AM    Patient Name: [name] [name]      Medical Record Number: [id number]  Date of Birth: 01-31-1941 Sex: Male         Room/Bed: 4W19C/4W19C-01 Payor Info: Payor: [employer] MEDICARE / Plan: [employer] MEDICARE HMO/PPO / Product Type: *No Product type* /    Admit Date/Time:  11/19/2023 11:09 AM  Primary Diagnosis:  Acute ischemic left MCA stroke [facility])  Hospital Problems: Principal Problem:   Acute ischemic left MCA stroke [facility]) Active Problems:   Arterial ischemic stroke, MCA, left, acute [facility] [place])    Expected Discharge Date: Expected Discharge Date: 12/06/23  Team Members Present: Physician leading conference: Dr. Laverle Postin Social Worker Present: Norval Been, LCSWA Nurse Present: Forrestine Ike, RN PT Present: [name], PT OT Present: Celestino Cole, OT SLP Present: Other (comment) Peggyann Bower, SLP)     Current Status/Progress Goal Weekly Team Focus  Bowel/Bladder   Cotinent/incontient of bowel/bladder. LBM 4/18   to be continent of bowel/bladder with Mod I   assess and treat any bowel/bladder need q shift and as needed    Swallow/Nutrition/ Hydration   regular/thin   supervision  tolerance, use of compensatory strategies    ADL's   Set up A UB, CGA LB and Supervision toileting   Goals already upgraded to grossly supervision   Barriers- intermittent retropulsion, dynamic standing balance, L inattention, delayed initiation and processing, standing from low surfaces    Mobility   rolling and supine<>sit supervision using bed features and sit<>supine min A, transfers with RW min A, gait 141ft with RW CGA/min A, 12 6in steps with 2 handrails min A   upgraded to supervision  barriers: fatigue, generalized weakness/deconditioning, decreased balance but making excellent progress    Communication   ~80% intelligible at the sentence level   minA   education of SLOP and  use, IOPI, EMST    Safety/Cognition/ Behavioral Observations               Pain   No complain of pain   To remain pain free   Assess and treat pain q shift and as needed    Skin   no skin issue.   skin to remain intact  assess and treat any skin issue q shift and as needed      Discharge Planning:  D/c pending support at home. Original plan for pt d/c to son's home who will provide support. Pt stepdtr [name] to provide primary support during the day while son is at work. SW will confirm there are no barriers to discharge.   Team Discussion: Patient post left MCA CVA with vascular dementia/Parkinsonism; Amantadine  added per MD.  Limited by fatigue, decreased balance, delayed processing and memory deficits, mild word finding deficits with left inattention and retropulsion with ambulation.  Patient on target to meet rehab goals: yes, currently needs set up for upper body care and CGA for lower body care with supervision for toileting. Needs min assist using a RW , able to ambulate up to 150 with CGA - min assist and manage (12) 6" steps. Goals for discharge set for supervision overall.   *See Care Plan and progress notes for long and short-term goals.   Revisions to Treatment Plan:  IOPI EMST   Teaching Needs: Safety, medications, dietary modification, transfers, toileting, etc.   Current Barriers to Discharge: Decreased caregiver support and Home enviroment access/layout  Possible Resolutions to Barriers:  Family education DME: Rollator OP follow up services     Medical Summary Current Status: acute left MCA CVA, vascular parkinsonism, constipation, HLD, insomnia, HTN  Barriers to Discharge: Medical stability  Barriers to Discharge Comments: acute left MCA CVA, vascular parkinsonism, constipation, HLD, insomnia, HTN Possible Resolutions to [employer] Focus: continue aspirin , continue amantadine , sorbitol  ordered, continue crestor , continue trazodone , continue  lopressor /cozaar /magnesium  supplement   Continued Need for Acute Rehabilitation Level of Care: The patient requires daily medical management by a physician with specialized training in physical medicine and rehabilitation for the following reasons: Direction of a multidisciplinary physical rehabilitation program to maximize functional independence : Yes Medical management of patient stability for increased activity during participation in an intensive rehabilitation regime.: Yes Analysis of laboratory values and/or radiology reports with any subsequent need for medication adjustment and/or medical intervention. : Yes   I attest that I was present, lead the team conference, and concur with the assessment and plan of the team.   Forrestine Ike B 11/29/2023, 2:02 PM

## 2023-11-29 NOTE — Progress Notes (Signed)
 Occupational Therapy Session Note  Patient Details  Name: ARNE SCHLENDER MRN: [id number] Date of Birth: 06-06-1941  Today's Date: 11/29/2023 OT Individual Time: 1027-2536 OT Individual Time Calculation (min): 60 min    Short Term Goals: Week 1:  OT Short Term Goal 1 (Week 1): Patient will transition to standing from sitting with mod assist to aide in pulling up/down LB clothing OT Short Term Goal 1 - Progress (Week 1): Met OT Short Term Goal 2 (Week 1): Patient will don LB clothing with mod assist OT Short Term Goal 2 - Progress (Week 1): Met OT Short Term Goal 3 (Week 1): Patient will trasnfer to commode with mod assist OT Short Term Goal 3 - Progress (Week 1): Met OT Short Term Goal 4 (Week 1): Patient will correctly sequence steps of familiar functional activity - eg. wash before dress, socks before shoes without cueing. OT Short Term Goal 4 - Progress (Week 1): Met Week 2:  OT Short Term Goal 1 (Week 2): Pt will stand for > 3 mins at sink during ADL task with CGA OT Short Term Goal 2 (Week 2): Pt will complete 2/3 toileting steps with CGA for balance OT Short Term Goal 3 (Week 2): Pt will initiate toileting during session with min questioning cues     Skilled Therapeutic Interventions/Progress Updates:    Pt received sitting in w/c ready for therapy. Pt agreeable to a shower. See ADL documentation below. Today's session focused on safe and controlled sit to stands from bed, w/c, BSC over toilet and tub bench with occasional cues to have feet under knees fully and to both push up with B hands and reach back with B hands.  On initial stand from Wc pt leaned back slightly and needed CGA but with cues able to complete the next set of sit to stands and from all other surfaces with close supervision.  Encouraged him to slow down to ensure he has a good base of support before standing.   He only had one minor LOB getting out of shower as he was trying to wrap the towel around his waist and  hold onto the rollater at the same time.  Cued pt to focus on one thing at a time. Pt sat on toilet seat to don underwear. Donned shirt in standing with holding balance well.  He does have some difficulty donning pants over left foot. Had pt use reacher.  Pt ambulated to EOB to don pants and socks/shoes. Had some difficulty with left sock and shoe. May benefit from a sock aide and shoe horn.  Pt fatigued at end of session and needed a brief rest on bed. He transferred to wc. Resting in wc with belt alarm on. All needs met.    Therapy Documentation Precautions:  Precautions Precautions: Fall Recall of Precautions/Restrictions: Impaired Precaution/Restrictions Comments: per chart, impulsive; on eval is lethargic/ medicated Restrictions Weight Bearing Restrictions Per Provider Order: No    Vital Signs: Therapy Vitals Pulse Rate: 84 Resp: 19 BP: (!) 148/84 Patient Position (if appropriate): Sitting Oxygen Therapy SpO2: 97 % O2 Device: Room Air Pain: Pain Assessment Pain Score: 0-No pain ADL:  Upper Body Bathing: Supervision/safety Where Assessed-Upper Body Bathing: Shower Lower Body Bathing: Contact guard Where Assessed-Lower Body Bathing: Shower Upper Body Dressing: Supervision/safety Where Assessed-Upper Body Dressing: Other (Comment) (standing) Lower Body Dressing: Minimal assistance (min A with underwear over L foot, L sock and shoe) Where Assessed-Lower Body Dressing: Edge of bed Toileting: Minimal assistance Where Assessed-Toileting: Toilet  Toilet Transfer: Furniture conservator/restorer Method: Ambulating Tub/Shower Transfer: Unable to assess Tub/Shower Transfer Method: Unable to assess [employer] Transfer: Administrator, arts Method: Designer, industrial/product: Grab bars, Transfer tub bench ADL Comments: pt participating well in therapy.  Attending well to tasks.   Therapy/Group: Individual Therapy  Rhapsody Wolven 11/29/2023, 9:34  AM

## 2023-11-29 NOTE — Progress Notes (Signed)
 Physical Therapy Session Note  Patient Details  Name: [name] [name] MRN: [id number] Date of Birth: 02-22-1941  Today's Date: 11/29/2023 PT Individual Time: 1301-1325 and 1430-1456 PT Individual Time Calculation (min): 24 min and 26 min  Short Term Goals: Week 1:  PT Short Term Goal 1 (Week 1): Pt will perform bed mobility with overall MinA. PT Short Term Goal 1 - Progress (Week 1): Met PT Short Term Goal 2 (Week 1): Pt will improve sitting balance with increased ability to produce forward lean with decreased vc. PT Short Term Goal 2 - Progress (Week 1): Met PT Short Term Goal 3 (Week 1): Pt will perform sit <>stand with MinA. PT Short Term Goal 3 - Progress (Week 1): Met PT Short Term Goal 4 (Week 1): Pt will perform stand pivot with MinA. PT Short Term Goal 4 - Progress (Week 1): Met PT Short Term Goal 5 (Week 1): Pt will ambulate at least 50 ft using LRAD with MinA. PT Short Term Goal 5 - Progress (Week 1): Met Week 2:  PT Short Term Goal 1 (Week 2): pt will perform bed mobility with CGA consistantly PT Short Term Goal 2 (Week 2): pt will perform bed<>chair transfers with LRAD and CGA PT Short Term Goal 3 (Week 2): pt will ambulate 30ft with LRAD and CGA  Skilled Therapeutic Interventions/Progress Updates:   Treatment Session 1 Received pt sitting in [facility] with daughter at bedside. Pt agreeable to PT treatment and denied any pain during session but reported 9/10 fatigue. Session with emphasis on functional mobility/transfers, generalized strengthening and endurance, dynamic standing balance/coordination, and ambulation. Pt performed all transfers with rollator and CGA fading to close supervision throughout session with occasional cues for rollator brake safety. Pt ambulated 152ft x 2 trials with rollator and CGA (just to hold pants up, otherwise supervision) to/from main therapy gym. Worked on blocekd practice sit<>stands without UE support x 5 reps and CGA from elevated EOM with  emphasis on quad strength. Returned to room and concluded session with pt sitting in [facility] with all needs within reach awaiting upcoming SLP session.   Treatment Session 2 Received pt sitting in [facility] with family at bedside. Pt agreeable to PT treatment and denied any pain during session but reported feeling more fatigued than earlier this afternoon. Session with emphasis on functional mobility/transfers, generalized strengthening and endurance, dynamic standing balance/coordination, and ambulation. Pt performed all transfers with rollator and CGA/close supervision throughout session. Pt ambulated 171ft with rollator and CGA to dayroom (CGA to hold pants, otherwise supervision). Pt performed seated BLE strengthening on Kinetron at 20 cm/sec for 5 minutes with emphasis on quad/glute strength. Pt then ambulated 24ft x 2 trials with rollator back to room - practiced backing rollator against wall to sit for rest break. Pt reporting 10/10 fatigue at end of session. Concluded session with pt sitting in WC, needs within reach, and seatbelt alarm on.   Therapy Documentation Precautions:  Precautions Precautions: Fall Recall of Precautions/Restrictions: Impaired Precaution/Restrictions Comments: per chart, impulsive; on eval is lethargic/ medicated Restrictions Weight Bearing Restrictions Per Provider Order: No  Therapy/Group: Individual Therapy [name] [name] PT, DPT 11/29/2023, 6:53 AM

## 2023-11-29 NOTE — Plan of Care (Signed)
  Problem: RH BOWEL ELIMINATION Goal: RH STG MANAGE BOWEL WITH ASSISTANCE Description: STG Manage Bowel with Mod I assist Assistance. Outcome: Progressing   Problem: RH BLADDER ELIMINATION Goal: RH STG MANAGE BLADDER WITH ASSISTANCE Description: STG Manage Bladder With min Assistance Outcome: Progressing   Problem: RH SAFETY Goal: RH STG ADHERE TO SAFETY PRECAUTIONS W/ASSISTANCE/DEVICE Description: STG Adhere to Safety Precautions With supervision Assistance/Device. Outcome: Progressing

## 2023-11-29 NOTE — Progress Notes (Signed)
 Patient ID: [name] [name], male   DOB: [birth year], 83 y.o.   MRN: [id number]  1227- SW spoke with pt son [name] to provide updates from team conference, d/c date remains 4/30, discuss family edu, and 24/7 care continues to be recommended. Reports family is still working on 24/7 care and where he will go as there is always someone home. SW reiterated family edu will give more insight to how well pt is doing physically, and medical team repots he will discharge at a supervision level of care. Fam edu on Friday 1pm-4pm. SW will confirm final DM.   Norval Been, MSW, LCSW Office: [phone] Cell: [phone] Fax: [phone]

## 2023-11-29 NOTE — Progress Notes (Signed)
 Speech Language Pathology Daily Session Note  Patient Details  Name: [name] [name] MRN: [id number] Date of Birth: [birth year]  Today's Date: 11/29/2023 SLP Individual Time: 1330-1430 SLP Individual Time Calculation (min): 60 min  Short Term Goals: Week 2: SLP Short Term Goal 1 (Week 2): STG = LTG due to ELOS  Skilled Therapeutic Interventions: Skilled therapy session focused on communication goals. SLP faciliatated session by targeting respiratory and lingual strength for speech though use of EMST (Expiratory Muscle Strength Training) and IPOI ([place]  Oral Performance Instrument). Patient completed EMST at 30cm H2O for 5 sets of 5 repetitions given supervisionA. SLP initiated use of IOPI set at 35 kpa anteriorly and posteriorly. Patient completed x20 repetitions at each location. Patient continues to present with low vocal quality and imprecise articulation, though given minA, reached 90% intelligibility at the sentence level. At the end of the session, patient recalled 4/4 speech strategies given modiA. Patient left in chair with alarm set and call bell in reach. Continue POC.   Pain Denies  Therapy/Group: Individual Therapy  Janei Scheff M.A., CCC-SLP 11/29/2023, 7:39 AM

## 2023-11-29 NOTE — Progress Notes (Addendum)
 PROGRESS NOTE   Subjective/Complaints: No new complaints this morning Ambulated very well today! Still without BM so sorbitol  ordered BP mildly elevated   ROS:   Pt denies SOB, abd pain insomnia improved +constipation    Objective:   No results found. No results for input(s): "WBC", "HGB", "HCT", "PLT" in the last 72 hours.  No results for input(s): "NA", "K", "CL", "CO2", "GLUCOSE", "BUN", "CREATININE", "CALCIUM " in the last 72 hours.   Intake/Output Summary (Last 24 hours) at 11/29/2023 1044 Last data filed at 11/28/2023 1843 Gross per 24 hour  Intake 360 ml  Output --  Net 360 ml        Physical Exam: Vital Signs Blood pressure (!) 148/84, pulse 84, temperature 97.6 F (36.4 C), resp. rate 19, height 6' (1.829 m), weight 88.8 kg, SpO2 97%.    General: awake, alert, appropriate, supine in bed- just woke him up; NAD HENT: conjugate gaze; oropharynx moist CV: regular rate and rhythm; no JVD Pulmonary: CTA B/L; no W/R/R- good air movement GI: soft, NT, ND, (+)BS- normoactive Psychiatric: appropriate- flat Neurological: alert, but sleepy Skin: intact Neuro: Alert and oriented x3 (to month, note date), slow processing, oriented to person only Musculoskeletal: 3/5 strength on right side Stable 4/23  Assessment/Plan: 1. Functional deficits which require 3+ hours per day of interdisciplinary therapy in a comprehensive inpatient rehab setting. Physiatrist is providing close team supervision and 24 hour management of active medical problems listed below. Physiatrist and rehab team continue to assess barriers to discharge/monitor patient progress toward functional and medical goals  Care Tool:  Bathing    Body parts bathed by patient: Right arm, Left arm, Chest, Abdomen, Front perineal area, Right upper leg, Left upper leg, Face, Right lower leg, Left lower leg   Body parts bathed by helper: Buttocks      Bathing assist Assist Level: Minimal Assistance - Patient > 75%     Upper Body Dressing/Undressing Upper body dressing   What is the patient wearing?: Pull over shirt    Upper body assist Assist Level: Supervision/Verbal cueing    Lower Body Dressing/Undressing Lower body dressing      What is the patient wearing?: Incontinence brief, Pants     Lower body assist Assist for lower body dressing: Moderate Assistance - Patient 50 - 74%     Toileting Toileting Toileting Activity did not occur (Clothing management and hygiene only): N/A (no void or bm)  Toileting assist Assist for toileting: Moderate Assistance - Patient 50 - 74%     Transfers Chair/bed transfer  Transfers assist     Chair/bed transfer assist level: Minimal Assistance - Patient > 75%     Locomotion Ambulation   Ambulation assist      Assist level: Minimal Assistance - Patient > 75% Assistive device: Walker-rolling Max distance: 457   Walk 10 feet activity   Assist     Assist level: Minimal Assistance - Patient > 75% Assistive device: Walker-rolling   Walk 50 feet activity   Assist Walk 50 feet with 2 turns activity did not occur: Safety/medical concerns  Assist level: Minimal Assistance - Patient > 75% Assistive device: Walker-rolling    Walk 150  feet activity   Assist Walk 150 feet activity did not occur: Safety/medical concerns  Assist level: Minimal Assistance - Patient > 75% Assistive device: Walker-rolling    Walk 10 feet on uneven surface  activity   Assist Walk 10 feet on uneven surfaces activity did not occur: Safety/medical concerns         Wheelchair     Assist Is the patient using a wheelchair?: Yes Type of Wheelchair: Manual    Wheelchair assist level: Dependent - Patient 0% Max wheelchair distance: 12 ft    Wheelchair 50 feet with 2 turns activity    Assist    Wheelchair 50 feet with 2 turns activity did not occur: Safety/medical  concerns       Wheelchair 150 feet activity     Assist  Wheelchair 150 feet activity did not occur: Safety/medical concerns       Blood pressure (!) 148/84, pulse 84, temperature 97.6 F (36.4 C), resp. rate 19, height 6' (1.829 m), weight 88.8 kg, SpO2 97%. Medical Problem List and Plan: 1. 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 Impaired ambulation: decrease Lovenox  to 30mg  since ambulating >150 feet             -antiplatelet therapy: aspirin , plavix , recommended DAPT for 90 days followed by Plavix  75mg  daily monotherapy  3. Thrombocytosis: continue DAPT  4. Ischemic cardiomyopathy: continue lopressor   5. Impaired cognition: This patient is not capable of making decisions on his own behalf.  6. HTN: continue lopressor  -BP controlled, continue current regimen, increase magnesium  gluconate to 500mg        11/29/2023    8:29 AM 11/29/2023    5:02 AM 11/28/2023    7:28 PM  Vitals with BMI  Systolic 148 133 161  Diastolic 84 73 78  Pulse 84 67 70    7.CAD s/p stent to LAD in 2012: continue aspirin  and plavix    8. Prediabetes: d/c ISS CBG (last 3)  No results for input(s): "GLUCAP" in the last 72 hours.   9. Delirium: resolved, d/c seroquel   10. Overweight: provide dietary education, d/c feeding supplement, vitamin D  and potassium ordered  11. Apneic episodes: referred for outpatient sleep study, O2 ordered HS.   12. AKI: Cr reviewed and has improved/resolved  13. Vitamin D  insufficiency: increase D3 to 2,000U daily  14. Hyperkalemia: lokelma  ordered, K+ reviewed and has resolved  15. 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 Insomnia: trazodone  started, continue  18. Impaired intelligibility: continue SLP  19. Impaired cognition:  continue SLP   LOS: 10 days A FACE TO FACE EVALUATION WAS PERFORMED  [name] [name] Brynna Dobos 11/29/2023, 10:44 AM

## 2023-11-29 NOTE — Progress Notes (Signed)
 Physical Therapy Session Note  Patient Details  Name: [name] [name] MRN: [id number] Date of Birth: 10/26/1940  Today's Date: 11/29/2023 PT Individual Time: 0865-7846 PT Individual Time Calculation (min): 24 min   Short Term Goals: Week 2:  PT Short Term Goal 1 (Week 2): pt will perform bed mobility with CGA consistantly PT Short Term Goal 2 (Week 2): pt will perform bed<>chair transfers with LRAD and CGA PT Short Term Goal 3 (Week 2): pt will ambulate 22ft with LRAD and CGA  Skilled Therapeutic Interventions/Progress Updates: Patient sitting in [facility] with handoff from OT on entrance to room. Patient alert and agreeable to PT session.   Patient with no complaints of pain. Session focused on ambulation with rollator as it was just introduced the previous day.  Pt performed sit to stands from WC<>rollator with CGA/close supervision with VC for rollator brake engagement.   Gait Training:  Pt ambulated roughly 320' x 1, and 160' x 1 using rollator with overall CGA/close supervision for safety around day room/nsg loop. Pt demonstrated the following gait deviations with therapist providing the described cuing and facilitation for improvement:  - Downward gaze with cues to look forward ahead - Decreased step clearance with cues to increase height - Safe proximity of rollator with cues to stay close to seat, and to depress B shoulders for decreased WB on handles to improve upright posture  Pt required mod cuing to maintain cues. Pt is self aware when rollator is pushed to unsafe distance towards end of ambulation. Education provided as to why rollator is further in front of pt as pt asked for reasoning (cues above).  Patient sitting in WC at end of session with brakes locked, belt alarm set, and all needs within reach.      Therapy Documentation Precautions:  Precautions Precautions: Fall Recall of Precautions/Restrictions: Impaired Precaution/Restrictions Comments: per chart, impulsive;  on eval is lethargic/ medicated Restrictions Weight Bearing Restrictions Per Provider Order: No   Therapy/Group: Individual Therapy  Javante Nilsson PTA 11/29/2023, 10:25 AM

## 2023-11-29 NOTE — Progress Notes (Signed)
 Recreational Therapy Session Note  Patient Details  Name: [name] [name] MRN: [id number] Date of Birth: 05-16-1941 Today's Date: 11/29/2023  Pain:no c/o  Pt participated in animal assisted activity seated w/c level with supervision.  Pt  easily engaged with pet partner team sharing about his personal dog.  Pt appreciative of this visit.  Kirk Sampley 11/29/2023, 3:25 PM

## 2023-11-30 LAB — GLUCOSE, CAPILLARY: Glucose-Capillary: 149 mg/dL — ABNORMAL HIGH (ref 70–99)

## 2023-11-30 MED ORDER — ENOXAPARIN SODIUM 40 MG/0.4ML IJ SOSY
40.0000 mg | PREFILLED_SYRINGE | INTRAMUSCULAR | Status: DC
Start: 2023-11-30 — End: 2023-12-06
  Administered 2023-11-30 – 2023-12-05 (×6): 40 mg via SUBCUTANEOUS
  Filled 2023-11-30 (×6): qty 0.4

## 2023-11-30 MED ORDER — DOCUSATE SODIUM 100 MG PO CAPS
100.0000 mg | ORAL_CAPSULE | Freq: Every day | ORAL | Status: DC
  Administered 2023-11-30 – 2023-12-01 (×2): 100 mg via ORAL
  Filled 2023-11-30 (×2): qty 1

## 2023-11-30 NOTE — Progress Notes (Signed)
 PROGRESS NOTE   Subjective/Complaints: No new complaints this morning Ambulating much better! Still has not had a BM but feels comfortable, discussed will start colace daily   ROS:  Denies SOB, abd pain insomnia improved +constipation    Objective:   No results found. No results for input(s): "WBC", "HGB", "HCT", "PLT" in the last 72 hours.  No results for input(s): "NA", "K", "CL", "CO2", "GLUCOSE", "BUN", "CREATININE", "CALCIUM " in the last 72 hours.   Intake/Output Summary (Last 24 hours) at 11/30/2023 1043 Last data filed at 11/30/2023 0850 Gross per 24 hour  Intake 520 ml  Output --  Net 520 ml        Physical Exam: Vital Signs Blood pressure (!) 143/85, pulse 71, temperature 98.1 F (36.7 C), resp. rate 16, height 6' (1.829 m), weight 88.8 kg, SpO2 96%.    General: awake, alert, appropriate, supine in bed- just woke him up; NAD HENT: conjugate gaze; oropharynx moist CV: regular rate and rhythm; no JVD Pulmonary: CTA B/L; no W/R/R- good air movement GI: soft, NT, ND, (+)BS- normoactive Psychiatric: appropriate- flat Neurological: alert, but sleepy Skin: intact Neuro: Alert and oriented x3 (to month, note date), slow processing, oriented to person only Musculoskeletal: 3/5 strength on right side Stable 4/24  Assessment/Plan: 1. Functional deficits which require 3+ hours per day of interdisciplinary therapy in a comprehensive inpatient rehab setting. Physiatrist is providing close team supervision and 24 hour management of active medical problems listed below. Physiatrist and rehab team continue to assess barriers to discharge/monitor patient progress toward functional and medical goals  Care Tool:  Bathing    Body parts bathed by patient: Right arm, Left arm, Chest, Abdomen, Front perineal area, Right upper leg, Left upper leg, Face, Right lower leg, Left lower leg   Body parts bathed by helper:  Buttocks     Bathing assist Assist Level: Minimal Assistance - Patient > 75%     Upper Body Dressing/Undressing Upper body dressing   What is the patient wearing?: Pull over shirt    Upper body assist Assist Level: Supervision/Verbal cueing    Lower Body Dressing/Undressing Lower body dressing      What is the patient wearing?: Incontinence brief, Pants     Lower body assist Assist for lower body dressing: Moderate Assistance - Patient 50 - 74%     Toileting Toileting Toileting Activity did not occur (Clothing management and hygiene only): N/A (no void or bm)  Toileting assist Assist for toileting: Moderate Assistance - Patient 50 - 74%     Transfers Chair/bed transfer  Transfers assist     Chair/bed transfer assist level: Minimal Assistance - Patient > 75%     Locomotion Ambulation   Ambulation assist      Assist level: Supervision/Verbal cueing Assistive device: Rollator Max distance: 157ft   Walk 10 feet activity   Assist     Assist level: Supervision/Verbal cueing Assistive device: Rollator   Walk 50 feet activity   Assist Walk 50 feet with 2 turns activity did not occur: Safety/medical concerns  Assist level: Supervision/Verbal cueing Assistive device: Rollator    Walk 150 feet activity   Assist Walk 150 feet activity did  not occur: Safety/medical concerns  Assist level: Supervision/Verbal cueing Assistive device: Rollator    Walk 10 feet on uneven surface  activity   Assist Walk 10 feet on uneven surfaces activity did not occur: Safety/medical concerns         Wheelchair     Assist Is the patient using a wheelchair?: Yes Type of Wheelchair: Manual    Wheelchair assist level: Dependent - Patient 0% Max wheelchair distance: 12 ft    Wheelchair 50 feet with 2 turns activity    Assist    Wheelchair 50 feet with 2 turns activity did not occur: Safety/medical concerns       Wheelchair 150 feet activity      Assist  Wheelchair 150 feet activity did not occur: Safety/medical concerns       Blood pressure (!) 143/85, pulse 71, temperature 98.1 F (36.7 C), resp. rate 16, height 6' (1.829 m), weight 88.8 kg, SpO2 96%. Medical Problem List and Plan: 1. 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 Impaired ambulation: continue lovenox , commended on improved ambulation!             -antiplatelet therapy: aspirin , plavix , recommended DAPT for 90 days followed by Plavix  75mg  daily monotherapy  3. Thrombocytosis: continue DAPT  4. Ischemic cardiomyopathy: continue lopressor   5. Impaired cognition: This patient is not capable of making decisions on his own behalf.  6. HTN: continue lopressor  -BP controlled, continue current regimen, increase magnesium  gluconate to 500mg        11/30/2023    6:34 AM 11/29/2023    7:47 PM 11/29/2023    8:29 AM  Vitals with BMI  Systolic 143 131 865  Diastolic 85 72 84  Pulse  71 84    7.CAD s/p stent to LAD in 2012: continue aspirin  and plavix    8. Prediabetes: d/c ISS CBG (last 3)  No results for input(s): "GLUCAP" in the last 72 hours.   9. Delirium: resolved, d/c seroquel   10. Overweight: provide dietary education, d/c feeding supplement, vitamin D  and potassium ordered  11. Apneic episodes: referred for outpatient sleep study, O2 ordered HS.   12. AKI: Cr reviewed and has improved/resolved  13. Vitamin D  insufficiency: increase D3 to 2,000U daily  14. Hyperkalemia: lokelma  ordered, K+ reviewed and has resolved  15. 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 Insomnia: trazodone  started, continue  18. Impaired intelligibility: continue SLP, discussed improvements!  19.  Impaired cognition: continue SLP   LOS: 11 days A FACE TO FACE EVALUATION WAS PERFORMED  [name] [name] 11/30/2023, 10:43 AM

## 2023-11-30 NOTE — Progress Notes (Signed)
 Physical Therapy Session Note  Patient Details  Name: [name] [name] MRN: [id number] Date of Birth: 10-16-1940  Today's Date: 11/30/2023 PT Individual Time: 1002-1044 PT Individual Time Calculation (min): 42 min   Short Term Goals: Week 2:  PT Short Term Goal 1 (Week 2): pt will perform bed mobility with CGA consistantly PT Short Term Goal 2 (Week 2): pt will perform bed<>chair transfers with LRAD and CGA PT Short Term Goal 3 (Week 2): pt will ambulate 55ft with LRAD and CGA  Skilled Therapeutic Interventions/Progress Updates:      Pt seated in WC upon arrival. Pt agreeable to therapy. Pt denies any pain.   Therapist offering to hold session outside for different scenery however pt opting not to go outside at this time.   Pt ambulated room<>day room<>central elevators<>room with rollator and supervision, verbal cues provided intermittently for increased step height/length especially with fatigue. Pt performed seated rest break x3 with intermittent questioning cues for locking the brakes prior to sitting.   Pt requesting to adjsut height of rollator as pt feels it is too short. Therapist attemtped however rollator is in tallest setting, therapist emphasized and demonstrated importance of pt standing closer to rollator with tall upright posture and less dependence on B UE/trunk flexion. Pt demos improved carry over with repetition.   Pt seated in WC at end of session with all needs wtihin reach and pt daughter in the room.   Therapy Documentation Precautions:  Precautions Precautions: Fall Recall of Precautions/Restrictions: Impaired Precaution/Restrictions Comments: per chart, impulsive; on eval is lethargic/ medicated Restrictions Weight Bearing Restrictions Per Provider Order: No  Therapy/Group: Individual Therapy  [facility] [place], [place], DPT  11/30/2023, 7:58 AM

## 2023-11-30 NOTE — Progress Notes (Signed)
 Occupational Therapy Session Note  Patient Details  Name: [name] [name] MRN: [id number] Date of Birth: 02-Jul-1941  Today's Date: 11/30/2023 OT Individual Time: 1405-1500 OT Individual Time Calculation (min): 55 min    Short Term Goals: Week 2:  OT Short Term Goal 1 (Week 2): Pt will stand for > 3 mins at sink during ADL task with CGA OT Short Term Goal 2 (Week 2): Pt will complete 2/3 toileting steps with CGA for balance OT Short Term Goal 3 (Week 2): Pt will initiate toileting during session with min questioning cues  Skilled Therapeutic Interventions/Progress Updates:  Pt greeted seated in w/c, pt agreeable to OT intervention.      Transfers/bed mobility/functional mobility:  Pt completed functional ambulation from room<>gym with rollator and supervision.   Therapeutic activity:  Pt completed standing tolerance task with pt able to stand for 2 mins to place weighted clothespin on net of backetball goal with a focus on standing with no UE support for ADL participation. Pt completd task with supervision with RUE completing the reaching.   Graded task up and had pt stand on airex to remove clothespins with RUE. Pt completed task with no UE support and CGA for balance.   Pt completed dynamic balance task with pt instructed to stand on airex to reach acorss midline to transport weighted balls from one side to the other to challenge BUE coordination and proximal strength. Pt completed task with CGA for balance.   Pt completed short distance functional mobility task with pt able to ambulate ~ 6 ft to transport bean bags from one side to the other to simulate IADL tasks at home. Pt completed task with rollator with CGA.   Worked on sit>stands from [facility] with no UE support to challenge LB strength/endurance. Pt completed x4 reps with MINA.   Ended session with pt seated in w/c with all needs within reach and daughter present.    Therapy Documentation Precautions:   Precautions Precautions: Fall Recall of Precautions/Restrictions: Impaired Precaution/Restrictions Comments: per chart, impulsive; on eval is lethargic/ medicated Restrictions Weight Bearing Restrictions Per Provider Order: No  Pain: No pain reported during session    Therapy/Group: Individual Therapy  Willadean Hark 11/30/2023, 3:12 PM

## 2023-11-30 NOTE — Evaluation (Signed)
 Recreational Therapy Assessment and Plan  Patient Details  Name: [name] [name] MRN: [id number] Date of Birth: March 21, 1941 Today's Date: 11/30/2023  Rehab Potential:  Good ELOS:   d/c 4/30  Assessment   Hospital Problem: Principal Problem:   Acute ischemic left MCA stroke (HCC) Active Problems:   Arterial ischemic stroke, MCA, left, acute (HCC)     Past Medical History:      Past Medical History:  Diagnosis Date   Arthritis     Depression     Diabetes mellitus without complication (HCC)     Gout     Hyperlipidemia     Hypertension     Ischemic cardiomyopathy          Past Surgical History:  History reviewed. No pertinent surgical history.       Assessment & Plan Clinical Impression: [name] [name] is a 83 y.o. male who presented to the ED with stroke symptoms. 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 Additional punctate subcentimeter acute ischemic nonhemorrhagic infarct was also present within the contralateral right basal ganglia. 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 He is being admitted to CIR today with right sided weakness, dysarthria, and cognitive impairment. Patient transferred to CIR on 11/19/2023.    Pt presents with decreased activity tolerance, decreased functional mobility, decreased balance, decreased attention, decreased awareness, decreased problem solving, decreased awareness, decreased memory Limiting pt's independence with leisure/community pursuits. Met with pt today to discuss TR services including leisure education, activity analysis/modifications and stress management.  Also discussed the importance of social, emotional, spiritual health in addition to physical health and their effects on overall health and wellness.  Pt stated understanding. No further TR as pt is expected to d/c home  4/30.  Recommendations for other services: None   Discharge Criteria: Patient will be discharged from TR if patient refuses treatment 3 consecutive times without medical reason.  If treatment goals not met, if there is a change in medical status, if patient makes no progress towards goals or if patient is discharged from hospital.  The above assessment, treatment plan, treatment alternatives and goals were discussed and mutually agreed upon: by patient  Laurajean Hosek 11/30/2023, 8:28 AM

## 2023-11-30 NOTE — Plan of Care (Signed)
  Problem: Consults Goal: RH STROKE PATIENT EDUCATION Description: See Patient Education module for education specifics  Outcome: Progressing Goal: Nutrition Consult-if indicated Outcome: Progressing Goal: Diabetes Guidelines if Diabetic/Glucose > 140 Description: If diabetic or lab glucose is > 140 mg/dl - Initiate Diabetes/Hyperglycemia Guidelines & Document Interventions  Outcome: Progressing   Problem: RH BOWEL ELIMINATION Goal: RH STG MANAGE BOWEL WITH ASSISTANCE Description: STG Manage Bowel with Mod I assist Assistance. Outcome: Progressing Goal: RH STG MANAGE BOWEL W/MEDICATION W/ASSISTANCE Description: STG Manage Bowel with Medication with mod I Assistance. Outcome: Progressing   Problem: RH BLADDER ELIMINATION Goal: RH STG MANAGE BLADDER WITH ASSISTANCE Description: STG Manage Bladder With min Assistance Outcome: Progressing   Problem: RH SAFETY Goal: RH STG ADHERE TO SAFETY PRECAUTIONS W/ASSISTANCE/DEVICE Description: STG Adhere to Safety Precautions With supervision Assistance/Device. Outcome: Progressing Goal: RH STG DECREASED RISK OF FALL WITH ASSISTANCE Description: STG Decreased Risk of Fall With min Assistance. Outcome: Progressing   Problem: RH COGNITION-NURSING Goal: RH STG USES MEMORY AIDS/STRATEGIES W/ASSIST TO PROBLEM SOLVE Description: STG Uses Memory Aids/Strategies With min Assistance to Problem Solve. Outcome: Progressing Goal: RH STG ANTICIPATES NEEDS/CALLS FOR ASSIST W/ASSIST/CUES Description: STG Anticipates Needs/Calls for Assist With min Assistance/Cues. Outcome: Progressing   Problem: RH KNOWLEDGE DEFICIT Goal: RH STG INCREASE KNOWLEDGE OF DIABETES Description: Patient and family will be able to manage DM using educational resources for medications and dietary modification independently Outcome: Progressing Goal: RH STG INCREASE KNOWLEDGE OF HYPERTENSION Description: Patient and family will be able to manage HTN using educational resources  for medications and dietary modification independently Outcome: Progressing Goal: RH STG INCREASE KNOWLEGDE OF HYPERLIPIDEMIA Description: Patient and family will be able to manage HLD using educational resources for medications and dietary modification independently Outcome: Progressing Goal: RH STG INCREASE KNOWLEDGE OF STROKE PROPHYLAXIS Description: Patient and family will be able to manage secondary risks using educational resources for medications and dietary modification independently Outcome: Progressing

## 2023-11-30 NOTE — Progress Notes (Signed)
 Physical Therapy Session Note  Patient Details  Name: [name] [name] MRN: [id number] Date of Birth: November 22, 1940  Today's Date: 11/30/2023 PT Individual Time: [phone] and [id number] PT Individual Time Calculation (min): 57 min and 56 min  Short Term Goals: Week 1:  PT Short Term Goal 1 (Week 1): Pt will perform bed mobility with overall MinA. PT Short Term Goal 1 - Progress (Week 1): Met PT Short Term Goal 2 (Week 1): Pt will improve sitting balance with increased ability to produce forward lean with decreased vc. PT Short Term Goal 2 - Progress (Week 1): Met PT Short Term Goal 3 (Week 1): Pt will perform sit <>stand with MinA. PT Short Term Goal 3 - Progress (Week 1): Met PT Short Term Goal 4 (Week 1): Pt will perform stand pivot with MinA. PT Short Term Goal 4 - Progress (Week 1): Met PT Short Term Goal 5 (Week 1): Pt will ambulate at least 50 ft using LRAD with MinA. PT Short Term Goal 5 - Progress (Week 1): Met Week 2:  PT Short Term Goal 1 (Week 2): pt will perform bed mobility with CGA consistantly PT Short Term Goal 2 (Week 2): pt will perform bed<>chair transfers with LRAD and CGA PT Short Term Goal 3 (Week 2): pt will ambulate 45ft with LRAD and CGA  Skilled Therapeutic Interventions/Progress Updates:   Treatment Session 1 Received pt sitting in recliner, pt agreeable to PT treatment, and denied any pain during session. Pt requesting to finish his coffee prior to starting session - donned shoes with max A and discussed pt's progress in rehab. Session with emphasis on functional mobility/transfers, toileting, generalized strengthening and endurance, dynamic standing balance/coordination, NMR, stair navigation, and ambulation. Pt reported urge to toilet - stood from low sitting recliner with rollator and min A (cues for anterior weight shifting) and ambulated in/out of bathroom with rollator and CGA. Pt able to manage clothing standing with CGA/close supervision. Pt able to void  and with gas - stood at sink and performed hand hygiene with supervision.   Pt performed remainder of transfers with rollator and CGA/close supervision throughout session. Pt ambulated 196ft x 2 trials with rollator and close supervision to/from main therapy gym. Stood on Airex x 2 trials (trial 1 with min A and trial 2 with CGA) and worked on dynamic standing balance/coordination, fine motor control, and problem solving recreating pictures with lego blocks. Pt able to complete simple pattern replication without assist but required min cues for more complex pattern replication. Pt required cues for upright posture and noted increased trunk flexion with fatigue from prolonged standing.   Ambulated to/from staircase (~29ft x 2) with rollator and supervision and navigated 12 6in steps with bilateral rails and supervision ascending and descending with a step to pattern to simulate home entry. Returned to room and pt requested to sit in [facility] at sink and wash face/brush teeth. Concluded session with pt sitting in WC, needs within reach, and seatbelt alarm on.   Treatment Session 2 Received pt sitting upright in bed with daughter at bedside. Pt agreeable to PT treatment and denied any pain during session but reported urge to toilet. Session with emphasis on functional mobility/transfers, toileting, generalized strengthening and endurance, dynamic standing balance/coordination, and ambulation. Pt transferred semi-reclined<>sitting L EOB with HOB elevated and use of bedrails with supervision/mod I.   Pt performed all transfers with rollator and CGA/close supervision throughout session - occasional cues to lock rollator brakes prior to sitting. Pt ambulated in/out of  bathroom with rollator and supervision. Pt able to manage clothing without assist. Pt able to void but with no BM, just gas. Stood at sink and performed hand hygiene with distant supervision. Pt then ambulated 32ft x 2 trials with rollator and supervision,  then requested to go outside.  Pt transported outside to entrance of [facility] in WC dependently for change in environmental stimuli and to uplift spirits. Pt ambulated ~154ft x 2 trials with rollator and supervision over uneven surfaces (concrete) and up/down mild slopes. Practiced furniture transfers on/off various surfaces (benches) and practiced backing rollator against stable surfaces to sit. Pt required min A to stand from low sitting bench. Returned to room and concluded session with pt sitting in [facility] with all needs within reach awaiting upcoming OT session.   Therapy Documentation Precautions:  Precautions Precautions: Fall Recall of Precautions/Restrictions: Impaired Precaution/Restrictions Comments: per chart, impulsive; on eval is lethargic/ medicated Restrictions Weight Bearing Restrictions Per Provider Order: No  Therapy/Group: Individual Therapy [name] [name] PT, DPT 11/30/2023, 6:58 AM

## 2023-12-01 LAB — GLUCOSE, CAPILLARY
Glucose-Capillary: 113 mg/dL — ABNORMAL HIGH (ref 70–99)
Glucose-Capillary: 119 mg/dL — ABNORMAL HIGH (ref 70–99)
Glucose-Capillary: 113 mg/dL — ABNORMAL HIGH (ref 70–99)
Glucose-Capillary: 127 mg/dL — ABNORMAL HIGH (ref 70–99)

## 2023-12-01 MED ORDER — LOSARTAN POTASSIUM 50 MG PO TABS
50.0000 mg | ORAL_TABLET | Freq: Every day | ORAL | Status: DC
  Administered 2023-12-02 – 2023-12-06 (×5): 50 mg via ORAL
  Filled 2023-12-01 (×5): qty 1

## 2023-12-01 NOTE — Progress Notes (Signed)
 Physical Therapy Session Note  Patient Details  Name: [name] [name] MRN: [id number] Date of Birth: 08/27/1940  Today's Date: 12/01/2023 PT Individual Time: 1301-1355 PT Individual Time Calculation (min): 54 min   Short Term Goals: Week 1:  PT Short Term Goal 1 (Week 1): Pt will perform bed mobility with overall MinA. PT Short Term Goal 1 - Progress (Week 1): Met PT Short Term Goal 2 (Week 1): Pt will improve sitting balance with increased ability to produce forward lean with decreased vc. PT Short Term Goal 2 - Progress (Week 1): Met PT Short Term Goal 3 (Week 1): Pt will perform sit <>stand with MinA. PT Short Term Goal 3 - Progress (Week 1): Met PT Short Term Goal 4 (Week 1): Pt will perform stand pivot with MinA. PT Short Term Goal 4 - Progress (Week 1): Met PT Short Term Goal 5 (Week 1): Pt will ambulate at least 50 ft using LRAD with MinA. PT Short Term Goal 5 - Progress (Week 1): Met Week 2:  PT Short Term Goal 1 (Week 2): pt will perform bed mobility with CGA consistantly PT Short Term Goal 2 (Week 2): pt will perform bed<>chair transfers with LRAD and CGA PT Short Term Goal 3 (Week 2): pt will ambulate 53ft with LRAD and CGA  Skilled Therapeutic Interventions/Progress Updates:   Received pt sitting in [facility] with family present for family education training. Pt agreeable to PT treatment and denied any pain during session. Session with emphasis on discharge planning, functional mobility/transfers, generalized strengthening and endurance, dynamic standing balance/coordination, ambulation, simulated car transfers, and stair navigation.   Pt performed all transfers with rollator and supervision throughout session - occasional cues to lock brakes. Pt ambulated 159ft with rollator and supervision to main therapy gym. Pt navigated 8 6in steps with bilateral handrails and supervision ascending and descending with a step to pattern - cues to ensure foot completely on step prior to  proceeding. Took seated rest break on rollator, then ambulated 135ft with rollator and supervision to ortho gym. Pt performed simulated car transfer with rollator and supervision (cues for anterior weight shifting to get up from low sitting seat). Suggested purchasing car cane for ease standing from low car seat. Pt then ambulated 68ft on uneven surfaces (ramp) with rollator and supervision.  Pt then ambulated >335ft with rollator and supervision back to room. Pt's family with additional questions regarding heart monitor and potential CPAP. Pt's family also requesting handicap parking pass - secure chatted MD/CSW.  Educated and discussed with pt's family the following: - recommendation to use rollator with all mobility - rollator safety (specifically brake management) - recommendation for 24/7 supervision upon discharge Concluded session with pt sitting in recliner with all needs within reach and family at bedside.   Therapy Documentation Precautions:  Precautions Precautions: Fall Recall of Precautions/Restrictions: Impaired Precaution/Restrictions Comments: per chart, impulsive; on eval is lethargic/ medicated Restrictions Weight Bearing Restrictions Per Provider Order: No  Therapy/Group: Individual Therapy [name] [name] PT, DPT 12/01/2023, 6:54 AM

## 2023-12-01 NOTE — Progress Notes (Signed)
 Speech Language Pathology Daily Session Note  Patient Details  Name: [name] [name] MRN: [id number] Date of Birth: October 22, 1940  Today's Date: 12/01/2023 SLP Individual Time: 1400-1430 SLP Individual Time Calculation (min): 30 min  Short Term Goals: Week 2: SLP Short Term Goal 1 (Week 2): STG = LTG due to ELOS  Skilled Therapeutic Interventions: Skilled therapy session focused on family education and communication. SLP educated patients family on patients current ST goals and activities completed in therapy (IOPI, EMST, etc). SLP reviewd patients diagnosis of dysarthria and subsequent strategies to increase intelligibility (SLOP: slow, loud over articulate, pause). SLP recommended continuation of EMST (expiratory muscle strength training) at d/c and educated patient and family on use. Patients family verbalized understanding of all education with no further questions. In additional minutes of the session, SLP engaged patient in completion of EMST for 2 sets of 5. Patient completed with supervisionA. Patient left in chair with alarm set and call bell in reach. Continue POC  Pain None reported   Therapy/Group: Individual Therapy  Jeilyn Reznik M.A., CCC-SLP 12/01/2023, 7:38 AM

## 2023-12-01 NOTE — Plan of Care (Signed)
  Problem: Consults Goal: RH STROKE PATIENT EDUCATION Description: See Patient Education module for education specifics  Outcome: Progressing   Problem: Consults Goal: Nutrition Consult-if indicated Outcome: Progressing   Problem: Consults Goal: Diabetes Guidelines if Diabetic/Glucose > 140 Description: If diabetic or lab glucose is > 140 mg/dl - Initiate Diabetes/Hyperglycemia Guidelines & Document Interventions  Outcome: Progressing   Problem: RH BOWEL ELIMINATION Goal: RH STG MANAGE BOWEL WITH ASSISTANCE Description: STG Manage Bowel with Mod I assist Assistance. Outcome: Progressing   Problem: RH BOWEL ELIMINATION Goal: RH STG MANAGE BOWEL W/MEDICATION W/ASSISTANCE Description: STG Manage Bowel with Medication with mod I Assistance. Outcome: Progressing   Problem: RH BLADDER ELIMINATION Goal: RH STG MANAGE BLADDER WITH ASSISTANCE Description: STG Manage Bladder With min Assistance Outcome: Progressing   Problem: RH SAFETY Goal: RH STG ADHERE TO SAFETY PRECAUTIONS W/ASSISTANCE/DEVICE Description: STG Adhere to Safety Precautions With supervision Assistance/Device. Outcome: Progressing

## 2023-12-01 NOTE — Progress Notes (Signed)
 Occupational Therapy Session Note  Patient Details  Name: [name] [name] MRN: [id number] Date of Birth: 15-Aug-1940  Today's Date: 12/01/2023 OT Individual Time: 1008-1103 & 1435-1530 OT Individual Time Calculation (min): 55 min & 55 min   Short Term Goals: Week 2:  OT Short Term Goal 1 (Week 2): Pt will stand for > 3 mins at sink during ADL task with CGA OT Short Term Goal 2 (Week 2): Pt will complete 2/3 toileting steps with CGA for balance OT Short Term Goal 3 (Week 2): Pt will initiate toileting during session with min questioning cues  Skilled Therapeutic Interventions/Progress Updates:  Session 1 Skilled OT intervention completed with focus on cardiovascular endurance, dynamic standing balance, ambulatory endurance, BUE strengthening/ROM. Pt received seated in w/c, agreeable to session. L shoulder pain reported with overhead movements; declined meds. OT offered rest breaks, repositioning a throughout for pain reduction.  Pt declined self-care needs. Completed all sit > stands and ambulatory transfers with supervision using rollator with intermittent cueing for body positioning and locking brakes.  Ambulated > gym. Pt completed the following intervals on nustep utilizing trail runner for sustained pace, to promote global/cardiovascular endurance needed for independence with BADLs and functional mobility: -5 mins, level 3 -5 mins, level 4 Intermittent rest break needed for fatigue.  Transferred to EOM. Pt participated in the following dynamic standing balance and endurance tasks to promote independence and safety during BADLs and functional mobility: -3 trials of alternating toe taps to colored/numbered dots as called out by therapist to incorporate cognitive/dual tasking component. CGA sit > stand without AD, and CGA/min A needed for balance, especially when tapping with LLE with noted delayed processing and motor planning with L side due to inattention  Standing with CGA, pt  completed the following BUE exercises to promote BUE strength/endurance needed for independence with functional transfers and BADLs: (With 5 lb dowel) -2x12 chest press -2x12 bicep curls -AROM shoulder flexion only due to L shoulder pain/weakness  Ambulated back to room, where pt remained seated in w/c with daughter present and with all needs in reach at end of session.  Session 2 Skilled OT intervention completed with focus on family education. Pt received seated in recliner, agreeable to session. No pain reported.  OT provided education on the following in prep for DC: -Recommended supervision (being in arms reach at all times) for all mobility (rollator with sit > stands and ambulation) and supervision for ADLs due to cues needed and frequency of LOB -Confirmed bathroom set up, with walk in shower with door and shower chair reported. Advised pt to use shower chair considering pt's CLOF, environmental set up and pt's balance/endurance deficits -Discussed backwards entry to shower using rollator with son being behind pt in case of posterior bias and then forward exit over the threshold -Energy conservation strategies- lukewarm water, proper ventilation via fan or cracked door to reduce steam, minimizing stands in shower especially during hair washing with eyes closed, and planning ahead for shower tasks to be rested/have rest time following in case of fatigue -Discussed use of BSC or push up rails over toilet to assist with sit > stand as pt relies heavily on the railing using BUE to power up -Family with questions regarding how to manage urinary spills due to scrotal edema- suggested pt sit further back on toilet and avoidance of wearing scrotal sling as gravity will assist when not wearing one with tucking penile region down for correct aim  OT assisted pt in demonstrating the following  during session: -Supervision sit > stands and ambulatory transfers > 300 ft at a time with intermittent  cueing for locking brakes and body positioning -CGA/close supervision backwards step into shower to shower chair with rollator with cues for locking brakes and LLE placement due to L inattention -Min A sit > stand from standard shower chair (without arm rests however they own one with arm rests) then supervision for stepping forward with rollator  Son assisted with the following: -CGA backwards entry to shower with rollator then min A for posterior LOB when stepping back to shower chair -CGA forward stepping over threshold to exit shower with rollator -Supervision ambulatory transfer > room with rollator  Pt remained seated in recliner, with family present and with all needs in reach at end of session.   Therapy Documentation Precautions:  Precautions Precautions: Fall Recall of Precautions/Restrictions: Impaired Precaution/Restrictions Comments: per chart, impulsive; on eval is lethargic/ medicated Restrictions Weight Bearing Restrictions Per Provider Order: No    Therapy/Group: Individual Therapy  Ruthanna Covert, MS, OTR/L  12/01/2023, 3:47 PM

## 2023-12-01 NOTE — Progress Notes (Signed)
 Patient ID: [name] [name], male   DOB: 25-Jun-1941, 83 y.o.   MRN: [id number]  Per PT, pt family requests handicap placard.   SW provided placard.   Norval Been, MSW, LCSW Office: [phone] Cell: [phone] Fax: [phone]

## 2023-12-01 NOTE — Progress Notes (Signed)
 PROGRESS NOTE   Subjective/Complaints: Says he did not receive his dinner until 9pm last night, discussed with nursing who will help to make sure his orders are in today Had BM 4/24   ROS:  Denies SOB, abd pain insomnia improved +constipation    Objective:   No results found. No results for input(s): "WBC", "HGB", "HCT", "PLT" in the last 72 hours.  No results for input(s): "NA", "K", "CL", "CO2", "GLUCOSE", "BUN", "CREATININE", "CALCIUM " in the last 72 hours.   Intake/Output Summary (Last 24 hours) at 12/01/2023 1045 Last data filed at 12/01/2023 0907 Gross per 24 hour  Intake 800 ml  Output --  Net 800 ml        Physical Exam: Vital Signs Blood pressure (!) 151/68, pulse 66, temperature 97.6 F (36.4 C), resp. rate 17, height 6' (1.829 m), weight 88.8 kg, SpO2 98%.    General: awake, alert, appropriate, supine in bed- just woke him up; NAD HENT: conjugate gaze; oropharynx moist CV: regular rate and rhythm; no JVD Pulmonary: CTA B/L; no W/R/R- good air movement GI: soft, NT, ND, (+)BS- normoactive Psychiatric: appropriate- flat Neurological: alert, but sleepy Skin: intact Neuro: Alert and oriented x3 (to month, note date), slow processing, oriented to person only Musculoskeletal: 3/5 strength on right side Stable 4/25  Assessment/Plan: 1. Functional deficits which require 3+ hours per day of interdisciplinary therapy in a comprehensive inpatient rehab setting. Physiatrist is providing close team supervision and 24 hour management of active medical problems listed below. Physiatrist and rehab team continue to assess barriers to discharge/monitor patient progress toward functional and medical goals  Care Tool:  Bathing    Body parts bathed by patient: Right arm, Left arm, Chest, Abdomen, Front perineal area, Right upper leg, Left upper leg, Face, Right lower leg, Left lower leg   Body parts bathed by  helper: Buttocks     Bathing assist Assist Level: Minimal Assistance - Patient > 75%     Upper Body Dressing/Undressing Upper body dressing   What is the patient wearing?: Pull over shirt    Upper body assist Assist Level: Supervision/Verbal cueing    Lower Body Dressing/Undressing Lower body dressing      What is the patient wearing?: Incontinence brief, Pants     Lower body assist Assist for lower body dressing: Moderate Assistance - Patient 50 - 74%     Toileting Toileting Toileting Activity did not occur (Clothing management and hygiene only): N/A (no void or bm)  Toileting assist Assist for toileting: Moderate Assistance - Patient 50 - 74%     Transfers Chair/bed transfer  Transfers assist     Chair/bed transfer assist level: Minimal Assistance - Patient > 75%     Locomotion Ambulation   Ambulation assist      Assist level: Supervision/Verbal cueing Assistive device: Rollator Max distance: 117ft   Walk 10 feet activity   Assist     Assist level: Supervision/Verbal cueing Assistive device: Rollator   Walk 50 feet activity   Assist Walk 50 feet with 2 turns activity did not occur: Safety/medical concerns  Assist level: Supervision/Verbal cueing Assistive device: Rollator    Walk 150 feet activity  Assist Walk 150 feet activity did not occur: Safety/medical concerns  Assist level: Supervision/Verbal cueing Assistive device: Rollator    Walk 10 feet on uneven surface  activity   Assist Walk 10 feet on uneven surfaces activity did not occur: Safety/medical concerns         Wheelchair     Assist Is the patient using a wheelchair?: Yes Type of Wheelchair: Manual    Wheelchair assist level: Dependent - Patient 0% Max wheelchair distance: 12 ft    Wheelchair 50 feet with 2 turns activity    Assist    Wheelchair 50 feet with 2 turns activity did not occur: Safety/medical concerns       Wheelchair 150 feet  activity     Assist  Wheelchair 150 feet activity did not occur: Safety/medical concerns       Blood pressure (!) 151/68, pulse 66, temperature 97.6 F (36.4 C), resp. rate 17, height 6' (1.829 m), weight 88.8 kg, SpO2 98%. Medical Problem List and Plan: 1. 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 Impaired ambulation: continue lovenox , commended on improved ambulation!             -antiplatelet therapy: aspirin , plavix , recommended DAPT for 90 days followed by Plavix  75mg  daily monotherapy  3. Thrombocytosis: continue DAPT  4. Ischemic cardiomyopathy: continue lopressor   5. Impaired cognition: This patient is not capable of making decisions on his own behalf.  6. HTN: continue lopressor , increase cozaar  to 50mg  daily -BP controlled, continue current regimen, increase magnesium  gluconate to 500mg        12/01/2023    4:52 AM 11/30/2023    9:49 PM 11/30/2023    7:50 PM  Vitals with BMI  Systolic 151 153 409  Diastolic 68 79 79  Pulse 66 75 75    7.CAD s/p stent to LAD in 2012: continue aspirin  and plavix    8. Prediabetes: d/c ISS CBG (last 3)  Recent Labs    11/30/23 2131 12/01/23 0702  GLUCAP 149* 113*     9. Delirium: resolved, d/c seroquel   10. Overweight: provide dietary education, d/c feeding supplement, vitamin D  and potassium ordered  11. Apneic episodes: referred for outpatient sleep study, O2 ordered HS.   12. AKI: Cr reviewed and has improved/resolved  13. Vitamin D  insufficiency: increase D3 to 2,000U daily  14. Hyperkalemia: lokelma  ordered, K+ reviewed and has resolved  15. 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 Insomnia: trazodone  started, continue  18. Impaired intelligibility: continue  SLP, discussed improvements!  19. Impaired cognition: continue SLP   LOS: 12 days A FACE TO FACE EVALUATION WAS PERFORMED  [name] [name] P Ericha Whittingham 12/01/2023, 10:45 AM

## 2023-12-02 NOTE — Progress Notes (Signed)
 Patient has slept well this shift. No complaints this shift. He is calm and cooperative with all care.

## 2023-12-02 NOTE — Progress Notes (Signed)
 Physical Therapy Session Note  Patient Details  Name: [name] [name] MRN: [id number] Date of Birth: 1941-08-05  Today's Date: 12/02/2023 PT Individual Time: 0731-0827 PT Individual Time Calculation (min): 56 min   Short Term Goals: Week 1:  PT Short Term Goal 1 (Week 1): Pt will perform bed mobility with overall MinA. PT Short Term Goal 1 - Progress (Week 1): Met PT Short Term Goal 2 (Week 1): Pt will improve sitting balance with increased ability to produce forward lean with decreased vc. PT Short Term Goal 2 - Progress (Week 1): Met PT Short Term Goal 3 (Week 1): Pt will perform sit <>stand with MinA. PT Short Term Goal 3 - Progress (Week 1): Met PT Short Term Goal 4 (Week 1): Pt will perform stand pivot with MinA. PT Short Term Goal 4 - Progress (Week 1): Met PT Short Term Goal 5 (Week 1): Pt will ambulate at least 50 ft using LRAD with MinA. PT Short Term Goal 5 - Progress (Week 1): Met Week 2:  PT Short Term Goal 1 (Week 2): pt will perform bed mobility with CGA consistantly PT Short Term Goal 2 (Week 2): pt will perform bed<>chair transfers with LRAD and CGA PT Short Term Goal 3 (Week 2): pt will ambulate 21ft with LRAD and CGA  Skilled Therapeutic Interventions/Progress Updates:   Received pt sitting in WC - breakfast tray just arrived. Pt agreeable to PT treatment and denied any pain during session. Provided full supervision while pt ate breakfast (per SLP), although did not have to provide any cues/instructions for safe eating. Pt required increased time to eat and with a few coughing episodes. While eating, reviewed events from family education yesterday and discharge recommendations for safe mobility - provided pt with handout for supervision level discharge instructions with rollator for pt/family to refer to.   Pt performed all transfers with rollator and supervision throughout session. Pt ambulated 129ft x 2 trials with rollator and supervision to/from dayroom. Pt  performed TUG with rollator with supervision with average of 23.6 seconds. Pt educated on test results and significance and recommendation to use rollator upon discharge - pt in agreement.  Trial 1: 24 seconds Trial 2: 23 seconds Trial 3: 24 seconds  Worked on dynamic standing balance, coordination, reaction time, and proprioception dribbling basketball for 3 minutes while standing with CGA/min A for balance. Transitioned from LUE to RUE and wide BOS to narrow BOS for increased challenge. Then transitioned to tossing/catching basketball against wall without UE support with min A for balance x 2 minutes increasing speed and working on stepping strategies when reaching outside BOS to catch ball. Returned to room and changed clothes. Removed dirty pants and shirt and donned clean ones sitting and standing using rollator with supervision. Concluded session with pt sitting in recliner, needs within reach, and chair pad alarm on.  Therapy Documentation Precautions:  Precautions Precautions: Fall Recall of Precautions/Restrictions: Impaired Precaution/Restrictions Comments: per chart, impulsive; on eval is lethargic/ medicated Restrictions Weight Bearing Restrictions Per Provider Order: No  Therapy/Group: Individual Therapy [name] [name] PT, DPT 12/02/2023, 6:52 AM

## 2023-12-03 DIAGNOSIS — I69320 Aphasia following cerebral infarction: Secondary | ICD-10-CM

## 2023-12-03 DIAGNOSIS — K5901 Slow transit constipation: Secondary | ICD-10-CM

## 2023-12-03 MED ORDER — POLYETHYLENE GLYCOL 3350 17 G PO PACK
17.0000 g | PACK | Freq: Every day | ORAL | Status: DC | PRN
  Administered 2023-12-03: 17 g via ORAL
  Filled 2023-12-03: qty 1

## 2023-12-03 NOTE — Plan of Care (Signed)
  Problem: RH BOWEL ELIMINATION Goal: RH STG MANAGE BOWEL WITH ASSISTANCE Description: STG Manage Bowel with Mod I assist Assistance. Outcome: Progressing   Problem: RH BLADDER ELIMINATION Goal: RH STG MANAGE BLADDER WITH ASSISTANCE Description: STG Manage Bladder With min Assistance Outcome: Progressing   Problem: RH SAFETY Goal: RH STG ADHERE TO SAFETY PRECAUTIONS W/ASSISTANCE/DEVICE Description: STG Adhere to Safety Precautions With supervision Assistance/Device. Outcome: Progressing Goal: RH STG DECREASED RISK OF FALL WITH ASSISTANCE Description: STG Decreased Risk of Fall With min Assistance. Outcome: Progressing

## 2023-12-03 NOTE — Progress Notes (Signed)
 Occupational Therapy Session Note  Patient Details  Name: [name] [name] MRN: [id number] Date of Birth: [birth year]  Today's Date: 12/03/2023 OT Individual Time: 0800-0900 OT Individual Time Calculation (min): 60 min    Short Term Goals: Week 1:  OT Short Term Goal 1 (Week 1): Patient will transition to standing from sitting with mod assist to aide in pulling up/down LB clothing OT Short Term Goal 1 - Progress (Week 1): Met OT Short Term Goal 2 (Week 1): Patient will don LB clothing with mod assist OT Short Term Goal 2 - Progress (Week 1): Met OT Short Term Goal 3 (Week 1): Patient will trasnfer to commode with mod assist OT Short Term Goal 3 - Progress (Week 1): Met OT Short Term Goal 4 (Week 1): Patient will correctly sequence steps of familiar functional activity - eg. wash before dress, socks before shoes without cueing. OT Short Term Goal 4 - Progress (Week 1): Met  Skilled Therapeutic Interventions/Progress Updates:    Patient up in w/c at the time of arrival. Patient reported not being able to sleep and that he woke up 3x's. Patient had no report of pain. Patient in agreement with completing core exercises to improve functional outcome.  The pt was able to ambulate using the Rollator >200 ft to the gym with 1 rest break. The pt went on to compete the NuStep for 10 minutes in duration.  The pt completed a ball toss exercise by throwing the balls into the basket incorporating BUE with 1 rest break.  The pt complete a dowel exercise using a 1lb dowel for shld flexion, and shld abduction 2 sets of 10 with 1 rest break. At the end of the session, the pt was able to ambulated back to his room using the Rollator for > 270ft and while returning to the recliner with his call light and bedside table within reach.   All additional needs of the pt were addressed prior exiting the room.   Therapy Documentation Precautions:  Precautions Precautions: Fall Recall of Precautions/Restrictions:  Impaired Precaution/Restrictions Comments: per chart, impulsive; on eval is lethargic/ medicated Restrictions Weight Bearing Restrictions Per Provider Order: No  Therapy/Group: Individual Therapy  [name] 12/03/2023, 4:01 PM

## 2023-12-03 NOTE — Progress Notes (Signed)
 PROGRESS NOTE   Subjective/Complaints:  Nursing noted constipation  Laxatives ordered  Pt aphasic but follows commands well   ROS:  Denies SOB, abd pain insomnia improved +constipation    Objective:   No results found. No results for input(s): "WBC", "HGB", "HCT", "PLT" in the last 72 hours.  No results for input(s): "NA", "K", "CL", "CO2", "GLUCOSE", "BUN", "CREATININE", "CALCIUM " in the last 72 hours.   Intake/Output Summary (Last 24 hours) at 12/03/2023 1215 Last data filed at 12/02/2023 1829 Gross per 24 hour  Intake 417 ml  Output --  Net 417 ml        Physical Exam: Vital Signs Blood pressure (!) 152/78, pulse 68, temperature 97.6 F (36.4 C), temperature source Oral, resp. rate 18, height 6' (1.829 m), weight 88.8 kg, SpO2 94%.  General: No acute distress Mood and affect are appropriate Heart: Regular rate and rhythm no rubs murmurs or extra sounds Lungs: Clear to auscultation, breathing unlabored, no rales or wheezes Abdomen: Positive bowel sounds, soft nontender to palpation, nondistended Extremities: No clubbing, cyanosis, or edema Skin: intact Neuro: Alert and oriented x3 (to month, note date), slow processing, oriented to person only Musculoskeletal: 3/5 strength on right side   Assessment/Plan: 1. Functional deficits which require 3+ hours per day of interdisciplinary therapy in a comprehensive inpatient rehab setting. Physiatrist is providing close team supervision and 24 hour management of active medical problems listed below. Physiatrist and rehab team continue to assess barriers to discharge/monitor patient progress toward functional and medical goals  Care Tool:  Bathing    Body parts bathed by patient: Right arm, Left arm, Chest, Abdomen, Front perineal area, Right upper leg, Left upper leg, Face, Right lower leg, Left lower leg   Body parts bathed by helper: Buttocks     Bathing  assist Assist Level: Minimal Assistance - Patient > 75%     Upper Body Dressing/Undressing Upper body dressing   What is the patient wearing?: Pull over shirt    Upper body assist Assist Level: Supervision/Verbal cueing    Lower Body Dressing/Undressing Lower body dressing      What is the patient wearing?: Incontinence brief, Pants     Lower body assist Assist for lower body dressing: Moderate Assistance - Patient 50 - 74%     Toileting Toileting Toileting Activity did not occur (Clothing management and hygiene only): N/A (no void or bm)  Toileting assist Assist for toileting: Moderate Assistance - Patient 50 - 74%     Transfers Chair/bed transfer  Transfers assist     Chair/bed transfer assist level: Supervision/Verbal cueing     Locomotion Ambulation   Ambulation assist      Assist level: Supervision/Verbal cueing Assistive device: Rollator Max distance: >323ft   Walk 10 feet activity   Assist     Assist level: Supervision/Verbal cueing Assistive device: Rollator   Walk 50 feet activity   Assist Walk 50 feet with 2 turns activity did not occur: Safety/medical concerns  Assist level: Supervision/Verbal cueing Assistive device: Rollator    Walk 150 feet activity   Assist Walk 150 feet activity did not occur: Safety/medical concerns  Assist level: Supervision/Verbal cueing Assistive device: Rollator  Walk 10 feet on uneven surface  activity   Assist Walk 10 feet on uneven surfaces activity did not occur: Safety/medical concerns   Assist level: Supervision/Verbal cueing Assistive device: Rollator   Wheelchair     Assist Is the patient using a wheelchair?: Yes Type of Wheelchair: Manual    Wheelchair assist level: Dependent - Patient 0% Max wheelchair distance: 12 ft    Wheelchair 50 feet with 2 turns activity    Assist    Wheelchair 50 feet with 2 turns activity did not occur: Safety/medical concerns        Wheelchair 150 feet activity     Assist  Wheelchair 150 feet activity did not occur: Safety/medical concerns       Blood pressure (!) 152/78, pulse 68, temperature 97.6 F (36.4 C), temperature source Oral, resp. rate 18, height 6' (1.829 m), weight 88.8 kg, SpO2 94%. Medical Problem List and Plan: 1. 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 D/c 4/30             -Continue CIR    Sw is trying to schedule family training  Grounds pass ordered  2. Impaired ambulation: continue lovenox , commended on improved ambulation!             -antiplatelet therapy: aspirin , plavix , recommended DAPT for 90 days followed by Plavix  75mg  daily monotherapy  3. Thrombocytosis: continue DAPT  4. Ischemic cardiomyopathy: continue lopressor   5. Impaired cognition: This patient is not capable of making decisions on his own behalf.  6. HTN: continue lopressor , increase cozaar  to 50mg  daily -BP controlled, continue current regimen, increase magnesium  gluconate to 500mg        12/03/2023    5:25 AM 12/02/2023    9:04 PM 12/02/2023    7:33 PM  Vitals with BMI  Systolic 152 159 829  Diastolic 78 75 75  Pulse 68 74 74    7.CAD s/p stent to LAD in 2012: continue aspirin  and plavix    8. Prediabetes: d/c ISS CBG (last 3)  Recent Labs    12/01/23 1135 12/01/23 1629 12/01/23 2019  GLUCAP 119* 113* 127*     9. Delirium: resolved, d/c seroquel   10. Overweight: provide dietary education, d/c feeding supplement, vitamin D  and potassium ordered  11. Apneic episodes: referred for outpatient sleep study, O2 ordered HS.   12. AKI: Cr reviewed and has improved/resolved  13. Vitamin D  insufficiency: increase D3 to 2,000U daily  14. Hyperkalemia: lokelma  ordered, K+ reviewed and has resolved  15. 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 Insomnia: trazodone   started, continue  18. Impaired intelligibility: continue SLP, discussed improvements!  19. Impaired cognition: continue SLP   LOS: 14 days A FACE TO FACE EVALUATION WAS PERFORMED  Genetta Kenning 12/03/2023, 12:15 PM

## 2023-12-04 MED ORDER — MAGNESIUM HYDROXIDE 400 MG/5ML PO SUSP
30.0000 mL | Freq: Once | ORAL | Status: AC
  Administered 2023-12-04: 30 mL via ORAL
  Filled 2023-12-04: qty 30

## 2023-12-04 NOTE — Progress Notes (Signed)
 Physical Therapy Session Note  Patient Details  Name: [name] [name] MRN: [id number] Date of Birth: 1941-04-23  Today's Date: 12/04/2023 PT Individual Time: 1032-1127 PT Individual Time Calculation (min): 55 min   Short Term Goals: Week 2:  PT Short Term Goal 1 (Week 2): pt will perform bed mobility with CGA consistantly PT Short Term Goal 2 (Week 2): pt will perform bed<>chair transfers with LRAD and CGA PT Short Term Goal 3 (Week 2): pt will ambulate 48ft with LRAD and CGA  Skilled Therapeutic Interventions/Progress Updates:      Pt seated in WC upon arrival. Pt agreeable to therapy. Pt denies any pain.   Pt performed sit to stand, ambulatory transfers, and gait room<>rehab apartment<>main gym<>room with Rollator and supervision, verbal cues provided for B LE hip/knee flexion/dorsiflexion for improved LE clearance and intermittent verbal cues provided for safety with rollator.   Pt ambulated throughout rehab apartment and performed reaching to various heights to collect numbered dots 1-12 in numerical order, verbal cues provided to lock brakes of rollator prior to reaching, and for safeyt with rollator with navigating turns with emphasisi on keeping B LE within frame of Rollator.   Pt performed sit to stand from recliner and couch with CGA, verbal cues provided for technique.   Pt stood with no AD and CGA, while performing bicep curls x10 with 5# tidal tank.   Pt stood with HHA and performed 2x10 toe taps on 6 inch steps, verbal cues provided for weight shift to R LE for improved clearance L LE.   Pt and pt daughter deny any questions upon discharge home.   Therapist directed pt and pt daughter to pt education binder in room. Education provided on signs and symptoms of a stroke, with emphasis on BE FAST acronym, and importance of seeking care immediately if pt demos one or more signs. Pt and pt daugher verbalized understanding and agreeable.   Pt seated in WC at end of session  with all needs within reach and pt daughter in the room.   Therapy Documentation Precautions:  Precautions Precautions: Fall Recall of Precautions/Restrictions: Impaired Precaution/Restrictions Comments: per chart, impulsive; on eval is lethargic/ medicated Restrictions Weight Bearing Restrictions Per Provider Order: No   Therapy/Group: Individual Therapy  [facility] [place], [place], DPT  12/04/2023, 7:55 AM

## 2023-12-04 NOTE — Progress Notes (Signed)
 Speech Language Pathology Daily Session Note  Patient Details  Name: [name] [name] MRN: [id number] Date of Birth: [birth year]  Today's Date: 12/04/2023 SLP Individual Time: 0950-1028 SLP Individual Time Calculation (min): 38 min  Short Term Goals: Week 2: SLP Short Term Goal 1 (Week 2): STG = LTG due to ELOS  Skilled Therapeutic Interventions:  Session 1: SLP conducted skilled therapy session targeting communication goals. Patient continues to present with severely limited vocal intensity resulting in significantly reduced intelligibility at all utterance lengths. SLP facilitated completion of EMST. Patient required mod assist for cues to accurately 'break the seal' on device at ~22.5 cmH2O for 5 sets of 5 repetitions.  SLP then facilitated word and sentence level speech production utilizing speech intelligibility increasing strategies - primarily 'LOUD". Patient benefited from min assist to achieve consistent use of LOUD voice. Patient was left in room with call bell in reach and alarm set. SLP will continue to target goals per plan of care.     Session 2: SLP conducted skilled therapy session targeting communication goals. Upon SLP entry, patient on commode with NT in room. Once indicated, SLP assisted patient back to bed with rollator and supervision.  SLP facilitated second round of EMST. This session, patient benefited from min assist for cues to accurately 'break the seal' on device at 22.5 cm H2O. SLP then facilitated word- and sentence-level speech production task utilizing LOUD speech intelligibility strategies. Patient benefited from supervision to min assist to achieve consistent use of strategies to achieve 90% accuracy. Patient was left in room with call bell in reach and alarm set. SLP will continue to target goals per plan of care.        Pain Pain Assessment Pain Scale: 0-10 Pain Score: 0-No pain  Therapy/Group: Individual Therapy  Dorla Gartner, M.A., CCC-SLP  Kassey Laforest  A Kaleem Sartwell 12/04/2023, 10:30 AM

## 2023-12-04 NOTE — Plan of Care (Signed)
  Problem: RH BOWEL ELIMINATION Goal: RH STG MANAGE BOWEL WITH ASSISTANCE Description: STG Manage Bowel with Mod I assist Assistance. Outcome: Progressing   Problem: RH BLADDER ELIMINATION Goal: RH STG MANAGE BLADDER WITH ASSISTANCE Description: STG Manage Bladder With min Assistance Outcome: Progressing   Problem: RH SAFETY Goal: RH STG ADHERE TO SAFETY PRECAUTIONS W/ASSISTANCE/DEVICE Description: STG Adhere to Safety Precautions With supervision Assistance/Device. Outcome: Progressing Goal: RH STG DECREASED RISK OF FALL WITH ASSISTANCE Description: STG Decreased Risk of Fall With min Assistance. Outcome: Progressing

## 2023-12-04 NOTE — Progress Notes (Signed)
 Occupational Therapy Session Note  Patient Details  Name: [name] [name] MRN: [id number] Date of Birth: August 05, 1941  Today's Date: 12/04/2023 OT Individual Time: 4098-1191 OT Individual Time Calculation (min): 42 min    Short Term Goals: Week 2:  OT Short Term Goal 1 (Week 2): Pt will stand for > 3 mins at sink during ADL task with CGA OT Short Term Goal 2 (Week 2): Pt will complete 2/3 toileting steps with CGA for balance OT Short Term Goal 3 (Week 2): Pt will initiate toileting during session with min questioning cues  Skilled Therapeutic Interventions/Progress Updates:  Skilled OT intervention completed with focus on ambulatory/cardiovascular endurance, dynamic standing balance and cognition. Pt received semi supine in bed, agreeable to session. No pain reported.  Pt declined self care needs. Transitioned > EOB with mod I using bed rail. Doffed grip socks and donned shoes with set up A. Pt required elevated bed height, then able to stand with supervision using rollator. Pt ambulated > 300 ft > gym using rollator with intermittent cues for proximity of rollator to body vs leaning on it. Extended seated rest needed.  Pt participated in the following activities in standing with supervision without AD to address dynamic balance and cognitive strategies needed for independence and safety with BADL management: -Motor speed dot test- 3.71 sec, min difficulty; results indicative of slower motor processing speed within pt's peripherals not necessarily just L (affected) side -Bell cancellation test- 3 min; 5 misses on L side indicating mild L inattention  Ambulated another 350 ft using rollator back to room. Pt remained seated in recliner, with chair alarm on/activated, and with all needs in reach at end of session.    Therapy Documentation Precautions:  Precautions Precautions: Fall Recall of Precautions/Restrictions: Impaired Precaution/Restrictions Comments: per chart, impulsive; on  eval is lethargic/ medicated Restrictions Weight Bearing Restrictions Per Provider Order: No     Therapy/Group: Individual Therapy  Ruthanna Covert, MS, OTR/L  12/04/2023, 9:48 AM

## 2023-12-04 NOTE — Progress Notes (Signed)
 PROGRESS NOTE   Subjective/Complaints: No BM in 4 days- milk of mag ordered Ambulating well this morning Slept poorly last night as is anxious about discharge   ROS:  Denies SOB, abd pain insomnia improved +constipation    Objective:   No results found. No results for input(s): "WBC", "HGB", "HCT", "PLT" in the last 72 hours.  No results for input(s): "NA", "K", "CL", "CO2", "GLUCOSE", "BUN", "CREATININE", "CALCIUM " in the last 72 hours.   Intake/Output Summary (Last 24 hours) at 12/04/2023 1057 Last data filed at 12/04/2023 0742 Gross per 24 hour  Intake 238 ml  Output --  Net 238 ml        Physical Exam: Vital Signs Blood pressure (!) 148/80, pulse 66, temperature 97.7 F (36.5 C), temperature source Oral, resp. rate 18, height 6' (1.829 m), weight 88.8 kg, SpO2 95%. General: awake, alert, appropriate, supine in bed- just woke him up; NAD HENT: conjugate gaze; oropharynx moist CV: regular rate and rhythm; no JVD Pulmonary: CTA B/L; no W/R/R- good air movement GI: soft, NT, ND, (+)BS- normoactive Psychiatric: appropriate- flat Neurological: alert, but sleepy Skin: intact Neuro: Alert and oriented x3 (to month, note date), slow processing, oriented to person only Musculoskeletal: 3/5 strength on right side Stable 4/28  Assessment/Plan: 1. Functional deficits which require 3+ hours per day of interdisciplinary therapy in a comprehensive inpatient rehab setting. Physiatrist is providing close team supervision and 24 hour management of active medical problems listed below. Physiatrist and rehab team continue to assess barriers to discharge/monitor patient progress toward functional and medical goals  Care Tool:  Bathing    Body parts bathed by patient: Right arm, Left arm, Chest, Abdomen, Front perineal area, Right upper leg, Left upper leg, Face, Right lower leg, Left lower leg   Body parts bathed by  helper: Buttocks     Bathing assist Assist Level: Minimal Assistance - Patient > 75%     Upper Body Dressing/Undressing Upper body dressing   What is the patient wearing?: Pull over shirt    Upper body assist Assist Level: Supervision/Verbal cueing    Lower Body Dressing/Undressing Lower body dressing      What is the patient wearing?: Incontinence brief, Pants     Lower body assist Assist for lower body dressing: Moderate Assistance - Patient 50 - 74%     Toileting Toileting Toileting Activity did not occur (Clothing management and hygiene only): N/A (no void or bm)  Toileting assist Assist for toileting: Moderate Assistance - Patient 50 - 74%     Transfers Chair/bed transfer  Transfers assist     Chair/bed transfer assist level: Supervision/Verbal cueing     Locomotion Ambulation   Ambulation assist      Assist level: Supervision/Verbal cueing Assistive device: Rollator Max distance: >377ft   Walk 10 feet activity   Assist     Assist level: Supervision/Verbal cueing Assistive device: Rollator   Walk 50 feet activity   Assist Walk 50 feet with 2 turns activity did not occur: Safety/medical concerns  Assist level: Supervision/Verbal cueing Assistive device: Rollator    Walk 150 feet activity   Assist Walk 150 feet activity did not occur: Safety/medical concerns  Assist level: Supervision/Verbal cueing Assistive device: Rollator    Walk 10 feet on uneven surface  activity   Assist Walk 10 feet on uneven surfaces activity did not occur: Safety/medical concerns   Assist level: Supervision/Verbal cueing Assistive device: Rollator   Wheelchair     Assist Is the patient using a wheelchair?: Yes Type of Wheelchair: Manual    Wheelchair assist level: Dependent - Patient 0% Max wheelchair distance: 12 ft    Wheelchair 50 feet with 2 turns activity    Assist    Wheelchair 50 feet with 2 turns activity did not occur:  Safety/medical concerns       Wheelchair 150 feet activity     Assist  Wheelchair 150 feet activity did not occur: Safety/medical concerns       Blood pressure (!) 148/80, pulse 66, temperature 97.7 F (36.5 C), temperature source Oral, resp. rate 18, height 6' (1.829 m), weight 88.8 kg, SpO2 95%. Medical Problem List and Plan: 1. 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 Impaired ambulation: continue lovenox , commended on improved ambulation!             -antiplatelet therapy: aspirin , plavix , recommended DAPT for 90 days followed by Plavix  75mg  daily monotherapy  3. Thrombocytosis: continue DAPT  4. Ischemic cardiomyopathy: continue lopressor   5. Impaired cognition: This patient is not capable of making decisions on his own behalf.  6. HTN: continue lopressor , increase cozaar  to 50mg  daily -BP controlled, continue current regimen, increase magnesium  gluconate to 500mg        12/04/2023    4:50 AM 12/03/2023    9:12 PM 12/03/2023    8:26 PM  Vitals with BMI  Systolic 148 160 557  Diastolic 80 81 81  Pulse 66 71 71    7.CAD s/p stent to LAD in 2012: continue aspirin  and plavix    8. Prediabetes: d/c ISS CBG (last 3)  Recent Labs    12/01/23 1135 12/01/23 1629 12/01/23 2019  GLUCAP 119* 113* 127*     9. Delirium: resolved, d/c seroquel   10. Overweight: provide dietary education, d/c feeding supplement, vitamin D  and potassium ordered  11. Apneic episodes: referred for outpatient sleep study, O2 ordered HS.   12. AKI: Cr reviewed and has improved/resolved  13. Vitamin D  insufficiency: increase D3 to 2,000U daily  14. Hyperkalemia: lokelma  ordered, K+ reviewed and has resolved  15. 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 Insomnia: trazodone  started, continue  18. Impaired intelligibility: continue SLP, discussed improvements!  19. Impaired cognition: continue SLP   LOS: 15 days A FACE TO FACE EVALUATION WAS PERFORMED  Keven Pel Liora Myles 12/04/2023, 10:57 AM

## 2023-12-05 ENCOUNTER — Other Ambulatory Visit (HOSPITAL_COMMUNITY): Payer: Self-pay

## 2023-12-05 MED ORDER — ROSUVASTATIN CALCIUM 20 MG PO TABS
20.0000 mg | ORAL_TABLET | Freq: Every day | ORAL | 0 refills | Status: DC
Start: 2023-12-05 — End: 2024-01-04
  Filled 2023-12-05: qty 30, 30d supply, fill #0

## 2023-12-05 MED ORDER — AMANTADINE HCL 100 MG PO CAPS
100.0000 mg | ORAL_CAPSULE | Freq: Every day | ORAL | 0 refills | Status: DC
  Filled 2023-12-05: qty 30, 30d supply, fill #0

## 2023-12-05 MED ORDER — TRAZODONE HCL 50 MG PO TABS
50.0000 mg | ORAL_TABLET | Freq: Every day | ORAL | 0 refills | Status: DC
  Filled 2023-12-05: qty 30, 30d supply, fill #0

## 2023-12-05 MED ORDER — METOPROLOL TARTRATE 25 MG PO TABS
25.0000 mg | ORAL_TABLET | Freq: Two times a day (BID) | ORAL | 0 refills | Status: DC
  Filled 2023-12-05: qty 60, 30d supply, fill #0

## 2023-12-05 MED ORDER — POLYETHYLENE GLYCOL 3350 17 G PO PACK
17.0000 g | PACK | Freq: Every day | ORAL | 0 refills | Status: DC | PRN
  Filled 2023-12-05: qty 14, 14d supply, fill #0

## 2023-12-05 MED ORDER — MAGNESIUM GLUCONATE 500 (27 MG) MG PO TABS
500.0000 mg | ORAL_TABLET | Freq: Every day | ORAL | 0 refills | Status: DC
  Filled 2023-12-05: qty 30, 30d supply, fill #0

## 2023-12-05 MED ORDER — LOSARTAN POTASSIUM 50 MG PO TABS
50.0000 mg | ORAL_TABLET | Freq: Every day | ORAL | 0 refills | Status: DC
  Filled 2023-12-05: qty 30, 30d supply, fill #0

## 2023-12-05 MED ORDER — CLOPIDOGREL BISULFATE 75 MG PO TABS
75.0000 mg | ORAL_TABLET | Freq: Every day | ORAL | 0 refills | Status: DC
  Filled 2023-12-05: qty 30, 30d supply, fill #0

## 2023-12-05 MED ORDER — VITAMIN D (ERGOCALCIFEROL) 1.25 MG (50000 UNIT) PO CAPS
50000.0000 [IU] | ORAL_CAPSULE | ORAL | 0 refills | Status: DC
  Filled 2023-12-05: qty 4, 28d supply, fill #0

## 2023-12-05 NOTE — Progress Notes (Signed)
 Speech Language Pathology Discharge Summary  Patient Details  Name: [name] [name] MRN: [id number] Date of Birth: 07-17-1941  Date of Discharge from SLP service:December 05, 2023  Today's Date: 12/05/2023 SLP Individual Time: 4098-1191 SLP Individual Time Calculation (min): 53 min   Skilled Therapeutic Interventions:  SLP conducted skilled therapy session targeting communication and swallowing goals. SLP facilitated EMST at 22.5 cm H2O, patient benefited from min assist to complete accurately for 5 sets of 5. SLP then guided patient through various reading and cognitive speech tasks with patient benefiting from supervision to min assist to maintain adequate volume/intelligibility at the phrase and sentence levels. Need increased to min-mod at the structured conversation level. In remaining minutes of session, assessed tolerance of regular/thin liquid diet. Patient consumed banana and water with no overt s/sx of penetration/aspiration nor difficulty. Likewise patient reports no difficulty with any meals. Patient was left in room with call bell in reach and alarm set. Patient is appropriate for discharge, see fully summary below.   Patient has met 3 of 3 long term goals.  Patient to discharge at overall Min;Modified Independent (modI swallowing, minA motor speech) level.  Reasons goals not met: n/a   Clinical Impression/Discharge Summary: Patient has made excellent progress towards therapy goals, meeting 3/3 long term goals set for duration of admission. Patient tolerates regular/thin liquid diet with modI and tolerates medications given whole in puree. Encouraged continuation of this method upon discharge due to increased likelihood of bolus misdirection after stroke. Patient currently benefits from min assist to utilize speech intelligibility increasing strategies at the sentence level and would benefit from continued ST services to target lingering communication deficits. Patient and family  education complete. SLP will sign off.    Care Partner:  Caregiver Able to Provide Assistance: Yes  Type of Caregiver Assistance: Cognitive  Recommendation:  Outpatient SLP  Rationale for SLP Follow Up: Maximize functional communication   Equipment: n/a   Reasons for discharge: Discharged from hospital   Patient/Family Agrees with Progress Made and Goals Achieved: Yes   Victoriya Pol, M.A., CCC-SLP   Bruk [place]olo A Chauntay Paszkiewicz 12/05/2023, 12:32 PM

## 2023-12-05 NOTE — Plan of Care (Signed)
  Problem: RH BOWEL ELIMINATION Goal: RH STG MANAGE BOWEL WITH ASSISTANCE Description: STG Manage Bowel with Mod I assist Assistance. Outcome: Progressing Goal: RH STG MANAGE BOWEL W/MEDICATION W/ASSISTANCE Description: STG Manage Bowel with Medication with mod I Assistance. Outcome: Progressing   Problem: RH BLADDER ELIMINATION Goal: RH STG MANAGE BLADDER WITH ASSISTANCE Description: STG Manage Bladder With min Assistance Outcome: Progressing   Problem: RH SAFETY Goal: RH STG ADHERE TO SAFETY PRECAUTIONS W/ASSISTANCE/DEVICE Description: STG Adhere to Safety Precautions With supervision Assistance/Device. Outcome: Progressing

## 2023-12-05 NOTE — Progress Notes (Signed)
 Physical Therapy Discharge Summary  Patient Details  Name: [name] [name] MRN: [id number] Date of Birth: [birth year]  Date of Discharge from PT service:December 05, 2023  Today's Date: 12/05/2023 PT Individual Time: 1300-1357 PT Individual Time Calculation (min): 57 min   Patient has met 10 of 10 long term goals due to improved activity tolerance, improved balance, improved postural control, increased strength, ability to compensate for deficits, improved attention, improved awareness, and improved coordination.  Patient to discharge at an ambulatory level Supervision using rollator. Patient's care partner is independent to provide the necessary physical assistance at discharge.  All goals met  Recommendation:  Patient will benefit from ongoing skilled PT services in outpatient setting to continue to advance safe functional mobility, address ongoing impairments in transfers, generalized strengthening and endurance, dynamic standing balance/coordination, NMR, and to minimize fall risk.  Equipment: No equipment provided - already has rollator  Reasons for discharge: treatment goals met and discharge from hospital  Patient/family agrees with progress made and goals achieved: Yes  Today's Interventions: Received pt sitting in [facility] [place] with daughter at bedside. Pt agreeable to PT treatment and denied any pain during session. Session with emphasis on discharge planning, functional mobility/transfers, generalized strengthening and endurance, dynamic standing balance/coordination, and ambulation.   Went through sensation, MMT, and pain interference questionnaire in preparation for discharge. Pt performed all transfers with rollator and supervision/mod I throughout session. Pt ambulated 155ft with rollator and supervision to dayroom. Pt able to stand and pick up object from floor using rollator and supervision. Worked on dynamic standing balance/coordination playing cornhole using RUE and CGA for balance  x 3 trials while standing on [employer] -  Pt performed mini squat<>overhead press with tidal tank x 5 reps - limited by pain in L shoulder. Transitioned to standing RDLs with tidal tank x8 reps with supervision for balance. Pt ambulated 143ft with rollator and supervision back to room. Concluded session with pt sitting in [facility] with all needs within reach and daughter at bedside. Updated safety plan and cleared pt's family to ambulate with him in room.    PT Discharge Precautions/Restrictions Precautions Precautions: Fall Restrictions Weight Bearing Restrictions Per Provider Order: No Pain Interference Pain Interference Pain Effect on Sleep: 0. Does not apply - I have not had any pain or hurting in the past 5 days Pain Interference with Therapy Activities: 0. Does not apply - I have not received rehabilitationtherapy in the past 5 days Pain Interference with Day-to-Day Activities: 1. Rarely or not at all Cognition Overall Cognitive Status: Impaired/Different from baseline Orientation Level: Oriented X4 Memory: Impaired Awareness: Appears intact Problem Solving: Impaired Safety/Judgment: Impaired Comments: significantly improved since evaluation Sensation Sensation Light Touch: Appears Intact Hot/Cold: Not tested Proprioception: Appears Intact Stereognosis: Not tested Coordination Gross Motor Movements are Fluid and Coordinated: No Fine Motor Movements are Fluid and Coordinated: Yes Coordination and Movement Description: mild R hemipareisis Finger Nose Finger Test: WFL bilaterally Heel Shin Test: WFL bilaterally Motor  Motor Motor: Hemiplegia Motor - Skilled Clinical Observations: mild R hemipareisis  Mobility Bed Mobility Bed Mobility: Rolling Right;Rolling Left;Sit to Supine;Supine to Sit Rolling Right: Independent with assistive device Rolling Left: Independent with assistive device Supine to Sit: Independent with assistive device Sit to Supine: Independent with assistive  device Transfers Transfers: Sit to Stand;Stand to Sit;Stand Pivot Transfers Sit to Stand: Independent with assistive device Stand to Sit: Independent with assistive device Stand Pivot Transfers: Supervision/Verbal cueing Stand Pivot Transfer Details: Verbal cues for technique;Verbal cues for precautions/safety;Verbal cues for safe  use of DME/AE Transfer (Assistive device): Rollator Locomotion  Gait Ambulation: Yes Gait Assistance: Supervision/Verbal cueing Gait Distance (Feet): 300 Feet Assistive device: Rollator Gait Assistance Details: Verbal cues for safe use of DME/AE Gait Assistance Details: occasional cues for brake safety Gait Gait: Yes Gait Pattern: Impaired Gait Pattern: Decreased step length - left;Poor foot clearance - right;Poor foot clearance - left;Step-through pattern;Decreased step length - right;Trunk flexed;Narrow base of support Gait velocity: decreased Stairs / Additional Locomotion Stairs: Yes Stairs Assistance: Supervision/Verbal cueing Stair Management Technique: Step to pattern;Forwards Number of Stairs: 12 Height of Stairs: 6 Ramp: Supervision/Verbal cueing (rollator) Wheelchair Mobility Wheelchair Mobility: No  Trunk/Postural Assessment  Cervical Assessment Cervical Assessment: Exceptions to [facility] (forward head) Thoracic Assessment Thoracic Assessment: Exceptions to [facility] (thoracic rounding) Lumbar Assessment Lumbar Assessment: Exceptions to [facility] (posterior pelvic tilt) Postural Control Postural Control: Deficits on evaluation Trunk Control: very mild posterior bias - improved since evaluation  Balance Balance Balance Assessed: Yes Static Sitting Balance Static Sitting - Balance Support: Feet supported;No upper extremity supported Static Sitting - Level of Assistance: 6: Modified independent (Device/Increase time) Dynamic Sitting Balance Dynamic Sitting - Balance Support: Feet supported;No upper extremity supported Dynamic Sitting - Level of  Assistance: 6: Modified independent (Device/Increase time) Static Standing Balance Static Standing - Balance Support: Bilateral upper extremity supported;During functional activity (rollator) Static Standing - Level of Assistance: 6: Modified independent (Device/Increase time) Dynamic Standing Balance Dynamic Standing - Balance Support: Bilateral upper extremity supported;During functional activity (rollator) Dynamic Standing - Level of Assistance: 5: Stand by assistance (supervision) Extremity Assessment  RLE Assessment RLE Assessment: Exceptions to [facility] General Strength Comments: tested sitting in WC RLE Strength Right Hip Flexion: 4/5 Right Hip ABduction: 4/5 Right Hip ADduction: 4/5 Right Knee Flexion: 4-/5 Right Knee Extension: 4-/5 Right Ankle Dorsiflexion: 4+/5 Right Ankle Plantar Flexion: 4+/5 LLE Assessment LLE Assessment: Exceptions to [facility] General Strength Comments: tested sitting EOB LLE Strength Left Hip Flexion: 4/5 Left Hip ABduction: 4-/5 Left Hip ADduction: 4-/5 Left Knee Flexion: 4-/5 Left Knee Extension: 4-/5 Left Ankle Dorsiflexion: 4+/5 Left Ankle Plantar Flexion: 4+/5   Rihana Kiddy M Zaunegger [name] PT, DPT 12/05/2023, 7:08 AM

## 2023-12-05 NOTE — Progress Notes (Signed)
 PROGRESS NOTE   Subjective/Complaints: No new complaints this morning  Had a BM this morning Discussed with Genella Kendall sending patient with miralax and discussed with patient that I will give him a list of high fiber foods   ROS:  Denies SOB, abd pain insomnia improved +constipation- improved    Objective:   No results found. No results for input(s): "WBC", "HGB", "HCT", "PLT" in the last 72 hours.  No results for input(s): "NA", "K", "CL", "CO2", "GLUCOSE", "BUN", "CREATININE", "CALCIUM " in the last 72 hours.   Intake/Output Summary (Last 24 hours) at 12/05/2023 0920 Last data filed at 12/05/2023 0748 Gross per 24 hour  Intake 480 ml  Output --  Net 480 ml        Physical Exam: Vital Signs Blood pressure 132/76, pulse 69, temperature 97.6 F (36.4 C), resp. rate 17, height 6' (1.829 m), weight 88.8 kg, SpO2 95%. General: awake, alert, appropriate, supine in bed- just woke him up; NAD HENT: conjugate gaze; oropharynx moist CV: regular rate and rhythm; no JVD Pulmonary: CTA B/L; no W/R/R- good air movement GI: soft, NT, ND, (+)BS- normoactive Psychiatric: appropriate- flat Neurological: alert, but sleepy Skin: intact Neuro: Alert and oriented x3 (to month, note date), slow processing, oriented to person only Musculoskeletal: right sided strength improved to 4-/5  Assessment/Plan: 1. Functional deficits which require 3+ hours per day of interdisciplinary therapy in a comprehensive inpatient rehab setting. Physiatrist is providing close team supervision and 24 hour management of active medical problems listed below. Physiatrist and rehab team continue to assess barriers to discharge/monitor patient progress toward functional and medical goals  Care Tool:  Bathing    Body parts bathed by patient: Right arm, Left arm, Chest, Abdomen, Front perineal area, Right upper leg, Left upper leg, Face, Right lower leg, Left  lower leg   Body parts bathed by helper: Buttocks     Bathing assist Assist Level: Minimal Assistance - Patient > 75%     Upper Body Dressing/Undressing Upper body dressing   What is the patient wearing?: Pull over shirt    Upper body assist Assist Level: Supervision/Verbal cueing    Lower Body Dressing/Undressing Lower body dressing      What is the patient wearing?: Incontinence brief, Pants     Lower body assist Assist for lower body dressing: Moderate Assistance - Patient 50 - 74%     Toileting Toileting Toileting Activity did not occur (Clothing management and hygiene only): N/A (no void or bm)  Toileting assist Assist for toileting: Moderate Assistance - Patient 50 - 74%     Transfers Chair/bed transfer  Transfers assist     Chair/bed transfer assist level: Supervision/Verbal cueing     Locomotion Ambulation   Ambulation assist      Assist level: Supervision/Verbal cueing Assistive device: Rollator Max distance: >362ft   Walk 10 feet activity   Assist     Assist level: Supervision/Verbal cueing Assistive device: Rollator   Walk 50 feet activity   Assist Walk 50 feet with 2 turns activity did not occur: Safety/medical concerns  Assist level: Supervision/Verbal cueing Assistive device: Rollator    Walk 150 feet activity   Assist Walk 150  feet activity did not occur: Safety/medical concerns  Assist level: Supervision/Verbal cueing Assistive device: Rollator    Walk 10 feet on uneven surface  activity   Assist Walk 10 feet on uneven surfaces activity did not occur: Safety/medical concerns   Assist level: Supervision/Verbal cueing Assistive device: Rollator   Wheelchair     Assist Is the patient using a wheelchair?: Yes Type of Wheelchair: Manual    Wheelchair assist level: Dependent - Patient 0% Max wheelchair distance: 12 ft    Wheelchair 50 feet with 2 turns activity    Assist    Wheelchair 50 feet with 2  turns activity did not occur: Safety/medical concerns       Wheelchair 150 feet activity     Assist  Wheelchair 150 feet activity did not occur: Safety/medical concerns       Blood pressure 132/76, pulse 69, temperature 97.6 F (36.4 C), resp. rate 17, height 6' (1.829 m), weight 88.8 kg, SpO2 95%. Medical Problem List and Plan: 1. 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 Impaired ambulation: continue lovenox , commended on improved ambulation!             -antiplatelet therapy: aspirin , plavix , recommended DAPT for 90 days followed by Plavix  75mg  daily monotherapy  3. Thrombocytosis: continue DAPT  4. Ischemic cardiomyopathy: continue lopressor   5. Impaired cognition: This patient is not capable of making decisions on his own behalf.  6. HTN: continue lopressor , increase cozaar  to 50mg  daily -BP controlled, continue current regimen, increase magnesium  gluconate to 500mg        12/05/2023    4:43 AM 12/04/2023    8:46 PM 12/04/2023    7:11 PM  Vitals with BMI  Systolic 132 159 119  Diastolic 76 80 80  Pulse 69 73 73    7.CAD s/p stent to LAD in 2012: continue aspirin  and plavix    8. Prediabetes: d/c ISS CBG (last 3)  No results for input(s): "GLUCAP" in the last 72 hours.    9. Delirium: resolved, d/c seroquel   10. Overweight: provide dietary education, d/c feeding supplement, vitamin D  and potassium ordered  11. Apneic episodes: referred for outpatient sleep study, O2 ordered HS.   12. AKI: Cr reviewed and has improved/resolved  13. Vitamin D  insufficiency: increase D3 to 2,000U daily  14. Hyperkalemia: lokelma  ordered, K+ reviewed and has resolved  15. 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 Insomnia: trazodone   started, continue  18. Impaired intelligibility: continue SLP, discussed improvements!  19. Impaired cognition: continue SLP   LOS: 16 days A FACE TO FACE EVALUATION WAS PERFORMED  Caroll Weinheimer P P[place]ola Dimmitt 12/05/2023, 9:20 AM

## 2023-12-05 NOTE — Progress Notes (Signed)
 Occupational Therapy Discharge Summary  Patient Details  Name: [name] [name] MRN: [id number] Date of Birth: 25-Mar-1941  Date of Discharge from OT service:December 05, 2023  Patient has met 12 of 12 long term goals due to improved activity tolerance, improved balance, improved awareness, and improved coordination.  Patient to discharge at overall Supervision level.  Patient's care partner is independent to provide the necessary physical and cognitive assistance at discharge. Family completed hands on training/education prior to DC regarding functional transfer and ADL recommendations and verbalized their readiness to assist pt at their CLOF.   All goals met  Recommendation:  Patient will benefit from ongoing skilled OT services in outpatient setting to continue to advance functional skills in the area of BADL, iADL, and Reduce care partner burden.  Equipment: No equipment provided  Reasons for discharge: treatment goals met  Patient/family agrees with progress made and goals achieved: Yes  OT Discharge Precautions/Restrictions  Precautions Precautions: Fall Restrictions Weight Bearing Restrictions Per Provider Order: No ADL ADL Equipment Provided: Long-handled sponge Eating: Supervision/safety Where Assessed-Eating: Wheelchair Grooming: Supervision/safety Where Assessed-Grooming: Standing at sink Upper Body Bathing: Setup Where Assessed-Upper Body Bathing: Shower Lower Body Bathing: Supervision/safety Where Assessed-Lower Body Bathing: Shower Upper Body Dressing: Setup Where Assessed-Upper Body Dressing: Wheelchair Lower Body Dressing: Supervision/safety Where Assessed-Lower Body Dressing: Sitting at sink, Standing at sink Toileting: Supervision/safety Where Assessed-Toileting: Toilet, Psychiatrist Transfer: Close supervision Toilet Transfer Method: Proofreader: Bedside commode, Other (comment) (rollator) Tub/Shower Transfer: Not  assessed Tub/Shower Transfer Method: Unable to assess Film/video editor: Administrator, arts Method: Designer, industrial/product: Shower seat with back ADL Comments: pt participating well in therapy.  Attending well to tasks. Vision Baseline Vision/History: 1 Wears glasses Patient Visual Report: No change from baseline Vision Assessment?: Yes;Wears glasses for reading Eye Alignment: Within Functional Limits Ocular Range of Motion: Within Functional Limits Alignment/Gaze Preference: Within Defined Limits Tracking/Visual Pursuits: Able to track stimulus in all quads without difficulty Visual Fields: No apparent deficits Perception  Perception: Impaired Perception-Other Comments: Mild L inattention Praxis Praxis: WFL Cognition Cognition Overall Cognitive Status: Impaired/Different from baseline Arousal/Alertness: Awake/alert Orientation Level: Person;Place;Situation Person: Oriented Place: Oriented Situation: Oriented Memory: Impaired Awareness: Appears intact Problem Solving: Impaired Safety/Judgment: Impaired Comments: significantly improved since evaluation Brief Interview for Mental Status (BIMS) Repetition of Three Words (First Attempt): 2 Temporal Orientation: Year: Correct Temporal Orientation: Month: Accurate within 5 days Temporal Orientation: Day: Correct Recall: "Sock": Yes, no cue required Recall: "Blue": Yes, no cue required Recall: "Bed": Yes, no cue required BIMS Summary Score: 14 Sensation Sensation Light Touch: Appears Intact Hot/Cold: Not tested Proprioception: Appears Intact Stereognosis: Not tested Coordination Gross Motor Movements are Fluid and Coordinated: No Fine Motor Movements are Fluid and Coordinated: Yes Coordination and Movement Description: mild R hemipareisis Finger Nose Finger Test: WFL bilaterally Heel Shin Test: WFL bilaterally Motor  Motor Motor: Hemiplegia Motor - Skilled Clinical Observations:  mild R hemipareisis Mobility  Bed Mobility Bed Mobility: Rolling Right;Rolling Left;Sit to Supine;Supine to Sit Rolling Right: Independent with assistive device Rolling Left: Independent with assistive device Supine to Sit: Independent with assistive device Sit to Supine: Independent with assistive device Transfers Sit to Stand: Independent with assistive device Stand to Sit: Independent with assistive device  Trunk/Postural Assessment  Cervical Assessment Cervical Assessment: Exceptions to [facility] (forward head) Thoracic Assessment Thoracic Assessment: Exceptions to [facility] (thoracic rounding) Lumbar Assessment Lumbar Assessment: Exceptions to [facility] (posterior pelvic tilt) Postural Control Postural Control: Deficits on evaluation Trunk Control:  very mild posterior bias - improved since evaluation  Balance Balance Balance Assessed: Yes Static Sitting Balance Static Sitting - Balance Support: Feet supported;No upper extremity supported Static Sitting - Level of Assistance: 6: Modified independent (Device/Increase time) Dynamic Sitting Balance Dynamic Sitting - Balance Support: Feet supported;No upper extremity supported Dynamic Sitting - Level of Assistance: 6: Modified independent (Device/Increase time) Static Standing Balance Static Standing - Balance Support: Bilateral upper extremity supported;During functional activity (rollator) Static Standing - Level of Assistance: 6: Modified independent (Device/Increase time) Dynamic Standing Balance Dynamic Standing - Balance Support: Bilateral upper extremity supported;During functional activity (rollator) Dynamic Standing - Level of Assistance: 5: Stand by assistance (supervision) Extremity/Trunk Assessment RUE Assessment RUE Assessment: Within Functional Limits LUE Assessment LUE Assessment: Exceptions to [facility] Active Range of Motion (AROM) Comments: delayed - can reach overhead ~ 110* shoulder flexion - difficulty sustaining   [name] E  Allan Minotti, MS, OTR/L  12/05/2023, 3:34 PM

## 2023-12-05 NOTE — Progress Notes (Signed)
 Patient ID: [name] [name], male   DOB: 07-08-1941, 83 y.o.   MRN: [id number]  SW met with pt dtr in room to review discharge. Outpatient referral will be sent to [facility] since closer to [name]'s home, and SW will order rollator as rollator at home is not safe to use.   1501- SW spoke with pt son [name] [name] to discuss above.  SW ordered rollator with [employer] via parachute.   Norval Been, MSW, LCSW Office: [phone] Cell: [phone] Fax: [phone]

## 2023-12-05 NOTE — Plan of Care (Signed)
  Problem: RH Swallowing Goal: LTG Pt will demonstrate functional change in swallow as evidenced by bedside/clinical objective assessment (SLP) Description: LTG: Patient will demonstrate functional change in swallow as evidenced by bedside/clinical objective assessment (SLP) Outcome: Completed/Met   Problem: RH Expression Communication Goal: LTG Patient will verbally express basic/complex needs(SLP) Description: LTG:  Patient will verbally express basic/complex needs, wants or ideas with cues  (SLP) Outcome: Completed/Met Goal: LTG Patient will increase speech intelligibility (SLP) Description: LTG: Patient will increase speech intelligibility at word/phrase/conversation level with cues, % of the time (SLP) Outcome: Completed/Met

## 2023-12-05 NOTE — Plan of Care (Signed)
  Problem: RH Balance Goal: LTG: Patient will maintain dynamic sitting balance (OT) Description: LTG:  Patient will maintain dynamic sitting balance with assistance during activities of daily living (OT) Outcome: Completed/Met Goal: LTG Patient will maintain dynamic standing with ADLs (OT) Description: LTG:  Patient will maintain dynamic standing balance with assist during activities of daily living (OT)  Outcome: Completed/Met   Problem: Sit to Stand Goal: LTG:  Patient will perform sit to stand in prep for activites of daily living with assistance level (OT) Description: LTG:  Patient will perform sit to stand in prep for activites of daily living with assistance level (OT) Outcome: Completed/Met   Problem: RH Grooming Goal: LTG Patient will perform grooming w/assist,cues/equip (OT) Description: LTG: Patient will perform grooming with assist, with/without cues using equipment (OT) Outcome: Completed/Met   Problem: RH Bathing Goal: LTG Patient will bathe all body parts with assist levels (OT) Description: LTG: Patient will bathe all body parts with assist levels (OT) Outcome: Completed/Met   Problem: RH Dressing Goal: LTG Patient will perform upper body dressing (OT) Description: LTG Patient will perform upper body dressing with assist, with/without cues (OT). Outcome: Completed/Met Goal: LTG Patient will perform lower body dressing w/assist (OT) Description: LTG: Patient will perform lower body dressing with assist, with/without cues in positioning using equipment (OT) Outcome: Completed/Met   Problem: RH Toileting Goal: LTG Patient will perform toileting task (3/3 steps) with assistance level (OT) Description: LTG: Patient will perform toileting task (3/3 steps) with assistance level (OT)  Outcome: Completed/Met   Problem: RH Toilet Transfers Goal: LTG Patient will perform toilet transfers w/assist (OT) Description: LTG: Patient will perform toilet transfers with assist,  with/without cues using equipment (OT) Outcome: Completed/Met   Problem: RH Tub/Shower Transfers Goal: LTG Patient will perform tub/shower transfers w/assist (OT) Description: LTG: Patient will perform tub/shower transfers with assist, with/without cues using equipment (OT) Outcome: Completed/Met   Problem: RH Attention Goal: LTG Patient will demonstrate this level of attention during functional activites (OT) Description: LTG:  Patient will demonstrate this level of attention during functional activites  (OT) Outcome: Completed/Met   Problem: RH Awareness Goal: LTG: Patient will demonstrate awareness during functional activites type of (OT) Description: LTG: Patient will demonstrate awareness during functional activites type of (OT) Outcome: Completed/Met

## 2023-12-05 NOTE — Progress Notes (Addendum)
 Inpatient Rehabilitation Discharge Medication Review by a Pharmacist  A complete drug regimen review was completed for this patient to identify any potential clinically significant medication issues.  High Risk Drug Classes Is patient taking? Indication by Medication  Antipsychotic No   Anticoagulant No   Antibiotic No   Opioid No   Antiplatelet Yes Aspirin , plavix - CVA ppx  Hypoglycemics/insulin  No   Vasoactive Medication Yes Losartan - HTN Lopressor - HTN  Chemotherapy No   Other Yes Amantadine - vascular parkinsonism Crestor - HLD Trazodone - sleep     Type of Medication Issue Identified Description of Issue Recommendation(s)  Drug Interaction(s) (clinically significant)     Duplicate Therapy     Allergy     No Medication Administration End Date     Incorrect Dose     Additional Drug Therapy Needed     Significant med changes from prior encounter (inform family/care partners about these prior to discharge).    Other       Clinically significant medication issues were identified that warrant physician communication and completion of prescribed/recommended actions by midnight of the next day:  No   Time spent performing this drug regimen review (minutes):  30   Rolla Servidio BS, PharmD, BCPS Clinical Pharmacist 12/05/2023 9:09 AM  Contact: [phone] after 3 PM  "Be curious, not judgmental..." -Rumalda Counter

## 2023-12-05 NOTE — Progress Notes (Signed)
 Occupational Therapy Session Note  Patient Details  Name: [name] [name] MRN: [id number] Date of Birth: 1941/04/15  Today's Date: 12/05/2023 OT Individual Time: 0850-1000 & 1445-1520 OT Individual Time Calculation (min): 70 min & 35 min OT missed time: 10 min Missed time reason: fatigue    Short Term Goals: Week 2:  OT Short Term Goal 1 (Week 2): Pt will stand for > 3 mins at sink during ADL task with CGA OT Short Term Goal 2 (Week 2): Pt will complete 2/3 toileting steps with CGA for balance OT Short Term Goal 3 (Week 2): Pt will initiate toileting during session with min questioning cues  Skilled Therapeutic Interventions/Progress Updates:  Session 1 Skilled OT intervention completed with focus on ADL retraining, functional endurance, and mobility within a shower context. Pt received seated in w/c, agreeable to session. No pain reported. Pt agreeable to shower in prep for DC.   Pt completed all sit > stands and ambulatory transfers with supervision while using rollator. Did have 1 posterior LOB while exiting shower, requiring min A to correct with pt verbalizing LLE fatigue. Intermittent cues needed for body positioning for safety throughout.   Pt required cueing due to L inattention for visually attending to L side of faucet to turn water warm. Pt was able to bathe all parts with intermittent supervision, at the sit > stand level while using grab bar for balance for periareas. Able to donn shirt/deo with set up A. Seated on rollator, pt threaded LB clothing with supervision using reacher, then close supervision sit > stand without AD for donning over hips. Donned slip on shoes with set up A. Set up A for hair grooming.  Pt ambulated 100 ft + 150 ft using rollator for ambulatory endurance needed for house level mobility. Pt remained seated in w/c, with daughter present, and with all needs in reach at end of session.  Session 2 Skilled OT intervention completed with focus on  ambulatory endurance to prep for community level mobility. Pt received seated in w/c, agreeable to session. No pain reported.  Pt verbalized fatigue however agreeable to go outside for therapy. Pt completed all sit > stands and ambulatory transfers with supervision using rollator however occasional CGA with progression of fatigue.  Pt ambulated about 300 ft prior to fatigue with demo of increased anterior lean and BUE dependence on rollator. Cues needed to correct, and suggestion of seated rest for when this occurs. In total, pt ambulated > 1000 ft, however from room > north tower, pt required 2 seated rest breaks, tolerating only about 200-300 ft prior to break down in body mechanics with ambulation. We discussed how such distances compare to community level mobility vs in home and how it also varies depending on time of day (I.e. end of day after all his therapies being more fatigued than in AM).   Pt ambulated > 1000 ft from [place] tower > room, however only 1 seated rest break needed this trial. Pt politely declined rest of session due to overall fatigue and readiness for DC therefore Pt missed 10 mins of OT intervention. Pt remained seated in recliner with family present and with all needs in reach at end of session.   Therapy Documentation Precautions:  Precautions Precautions: Fall Restrictions Weight Bearing Restrictions Per Provider Order: No    Therapy/Group: Individual Therapy  Ruthanna Covert, MS, OTR/L  12/05/2023, 3:31 PM

## 2023-12-06 NOTE — Progress Notes (Signed)
 PROGRESS NOTE   Subjective/Complaints: No new complaints this morning Waiting on rollator Discussed reason for starting amantadine  Discussed constipation  ROS: +constipation   Objective:   No results found. No results for input(s): "WBC", "HGB", "HCT", "PLT" in the last 72 hours. No results for input(s): "NA", "K", "CL", "CO2", "GLUCOSE", "BUN", "CREATININE", "CALCIUM " in the last 72 hours.  Intake/Output Summary (Last 24 hours) at 12/06/2023 0950 Last data filed at 12/06/2023 0800 Gross per 24 hour  Intake 657 ml  Output --  Net 657 ml        Physical Exam: Vital Signs Blood pressure (!) 160/75, pulse 71, temperature 98.1 F (36.7 C), temperature source Oral, resp. rate 16, height 6' (1.829 m), weight 88.8 kg, SpO2 94%. Gen: no distress, normal appearing HEENT: oral mucosa pink and moist, NCAT Cardio: Reg rate Chest: normal effort, normal rate of breathing Abd: soft, non-distended Ext: no edema Psych: pleasant, normal affect Neuro: Alert and oriented x3 (to month, note date), slow processing, oriented to person only Musculoskeletal: right sided strength improved to 4-/5   Assessment/Plan: 1. Functional deficits which require 3+ hours per day of interdisciplinary therapy in a comprehensive inpatient rehab setting. Physiatrist is providing close team supervision and 24 hour management of active medical problems listed below. Physiatrist and rehab team continue to assess barriers to discharge/monitor patient progress toward functional and medical goals  Care Tool:  Bathing    Body parts bathed by patient: Right arm, Left arm, Chest, Abdomen, Front perineal area, Right upper leg, Left upper leg, Face, Right lower leg, Left lower leg, Buttocks   Body parts bathed by helper: Buttocks     Bathing assist Assist Level: Supervision/Verbal cueing     Upper Body Dressing/Undressing Upper body dressing   What is  the patient wearing?: Pull over shirt    Upper body assist Assist Level: Set up assist    Lower Body Dressing/Undressing Lower body dressing      What is the patient wearing?: Incontinence brief, Pants     Lower body assist Assist for lower body dressing: Supervision/Verbal cueing     Toileting Toileting Toileting Activity did not occur (Clothing management and hygiene only): N/A (no void or bm)  Toileting assist Assist for toileting: Supervision/Verbal cueing     Transfers Chair/bed transfer  Transfers assist     Chair/bed transfer assist level: Supervision/Verbal cueing     Locomotion Ambulation   Ambulation assist      Assist level: Supervision/Verbal cueing Assistive device: Rollator Max distance: >321ft   Walk 10 feet activity   Assist     Assist level: Supervision/Verbal cueing Assistive device: Rollator   Walk 50 feet activity   Assist Walk 50 feet with 2 turns activity did not occur: Safety/medical concerns  Assist level: Supervision/Verbal cueing Assistive device: Rollator    Walk 150 feet activity   Assist Walk 150 feet activity did not occur: Safety/medical concerns  Assist level: Supervision/Verbal cueing Assistive device: Rollator    Walk 10 feet on uneven surface  activity   Assist Walk 10 feet on uneven surfaces activity did not occur: Safety/medical concerns   Assist level: Supervision/Verbal cueing Assistive device: Rollator  Wheelchair     Assist Is the patient using a wheelchair?: No Type of Wheelchair: Manual Wheelchair activity did not occur: N/A  Wheelchair assist level: Dependent - Patient 0% Max wheelchair distance: 12 ft    Wheelchair 50 feet with 2 turns activity    Assist    Wheelchair 50 feet with 2 turns activity did not occur: N/A       Wheelchair 150 feet activity     Assist  Wheelchair 150 feet activity did not occur: N/A       Blood pressure (!) 160/75, pulse 71,  temperature 98.1 F (36.7 C), temperature source Oral, resp. rate 16, height 6' (1.829 m), weight 88.8 kg, SpO2 94%.  Medical Problem List and Plan: 1. 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 Impaired ambulation: d/c lovenox  upon discharge, commended on improved ambulation!             -antiplatelet therapy: aspirin , plavix , recommended DAPT for 90 days followed by Plavix  75mg  daily monotherapy   3. Thrombocytosis: continue DAPT   4. Ischemic cardiomyopathy: continue lopressor    5. Impaired cognition: This patient is not capable of making decisions on his own behalf.   6. HTN: continue lopressor , increase cozaar  to 50mg  daily -BP controlled, continue current regimen, increase magnesium  gluconate to 500mg                    12/05/2023    4:43 AM 12/04/2023    8:46 PM 12/04/2023    7:11 PM  Vitals with BMI  Systolic 132 159 161  Diastolic 76 80 80  Pulse 69 73 73      7.CAD s/p stent to LAD in 2012: continue aspirin  and plavix      8. Prediabetes: d/c ISS CBG (last 3)  Recent Labs (last 2 labs)  No results for input(s): "GLUCAP" in the last 72 hours.         9. Delirium: resolved, d/c seroquel    10. Overweight: provide dietary education, d/c feeding supplement, vitamin D  and potassium ordered   11. Apneic episodes: referred for outpatient sleep study, O2 ordered HS.    12. AKI: Cr reviewed and has improved/resolved   13. Vitamin D  insufficiency: increase D3 to 2,000U daily   14. Hyperkalemia: lokelma  ordered, K+ reviewed and has resolved   15. 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 Insomnia: trazodone  started, continue   18. Impaired intelligibility: continue SLP, discussed improvements!    19. Impaired cognition: continue SLP   >30 minutes spent in discharge of patient including review of medications and follow-up appointments, physical examination, and in answering all patient's questions  LOS: 17 days A FACE TO FACE EVALUATION WAS PERFORMED  Lavell Portugal P Chamika Cunanan 12/06/2023, 9:50 AM

## 2023-12-06 NOTE — Progress Notes (Signed)
 Inpatient Rehabilitation Care Coordinator Discharge Note   Patient Details  Name: [name] [name] MRN: [id number] Date of Birth: March 18, 1941   Discharge location: D/c to his son [name] [name]'s home  Length of Stay: 16 days  Discharge activity level: Supervision using rollator  Home/community participation: Limited  Patient response EX:BMWUXL Literacy - How often do you need to have someone help you when you read instructions, pamphlets, or other written material from your doctor or pharmacy?: Rarely  Patient response KG:MWNUUV Isolation - How often do you feel lonely or isolated from those around you?: Patient unable to respond  Services provided included: MD, RD, PT, OT, SLP, RN, CM, Pharmacy, Neuropsych, SW, TR  Financial Services:  Field seismologist Utilized: Private Insurance [employer]  Choices offered to/list presented to: patient children  Follow-up services arranged:  Outpatient, DME    Outpatient Servicies: [facility] DME : [employer] for rollator    Patient response to transportation need: Is the patient able to respond to transportation needs?: Yes In the past 12 months, has lack of transportation kept you from medical appointments or from getting medications?: No In the past 12 months, has lack of transportation kept you from meetings, work, or from getting things needed for daily living?: No   Patient/Family verbalized understanding of follow-up arrangements:  Yes  Individual responsible for coordination of the follow-up plan: contact pt son [name] [name]  Confirmed correct DME delivered: [name] Case 12/06/2023    Comments (or additional information):fam edu completed  Summary of Stay    Date/Time Discharge Planning CSW  12/04/23 1149 D/c pending support at home. Original plan for pt d/c to son's home who will provide support. Pt stepdtr [name] [name] to provide primary support during the day while son is at work. SW will  confirm there are no barriers to discharge. AAC  11/27/23 1504 D/c pending support at home. Original plan for pt d/c to son's home who will provide support. Pt stepdtr [name] [name] to provide primary support during the day while son is at work. SW will confirm there are no barriers to discharge. AAC  11/21/23 1104 D/c to son's home who will provide support. Pt stepdtr [name] [name] to provide primary support during the day while son is at work. SW will confirm there are no barriers to discharge. AAC       Lorielle Boehning A Brendolyn Callas

## 2023-12-07 ENCOUNTER — Ambulatory Visit: Attending: Physician Assistant | Admitting: Speech Pathology

## 2023-12-07 ENCOUNTER — Ambulatory Visit

## 2023-12-07 DIAGNOSIS — R471 Dysarthria and anarthria: Secondary | ICD-10-CM | POA: Insufficient documentation

## 2023-12-07 DIAGNOSIS — M6281 Muscle weakness (generalized): Secondary | ICD-10-CM

## 2023-12-07 DIAGNOSIS — R4701 Aphasia: Secondary | ICD-10-CM | POA: Diagnosis present

## 2023-12-07 DIAGNOSIS — R41841 Cognitive communication deficit: Secondary | ICD-10-CM | POA: Insufficient documentation

## 2023-12-07 DIAGNOSIS — I63512 Cerebral infarction due to unspecified occlusion or stenosis of left middle cerebral artery: Secondary | ICD-10-CM | POA: Insufficient documentation

## 2023-12-07 DIAGNOSIS — R2681 Unsteadiness on feet: Secondary | ICD-10-CM | POA: Insufficient documentation

## 2023-12-07 DIAGNOSIS — R262 Difficulty in walking, not elsewhere classified: Secondary | ICD-10-CM | POA: Diagnosis present

## 2023-12-07 NOTE — Therapy (Signed)
 OUTPATIENT PHYSICAL THERAPY NEURO EVALUATION   Patient Name: [name] [name] MRN: [id number] DOB:[birth year], 83 y.o., male Today's Date: 12/07/2023   PCP: Al Hover, MD REFERRING PROVIDER:   Sterling Eisenmenger, PA-C    END OF SESSION:  PT End of Session - 12/07/23 1646     Visit Number 1    Number of Visits 25    Date for PT Re-Evaluation 02/29/24    PT Start Time 1402    PT Stop Time 1445    PT Time Calculation (min) 43 min    Equipment Utilized During Treatment Gait belt    Activity Tolerance Patient tolerated treatment well    Behavior During Therapy Flat affect             Past Medical History:  Diagnosis Date   Arthritis    Depression    Diabetes mellitus without complication (HCC)    Gout    Hyperlipidemia    Hypertension    Ischemic cardiomyopathy    History reviewed. No pertinent surgical history. 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   Ischemic cardiomyopathy 06/12/2018   Thrombocytosis 04/16/2016   Adjustment reaction with prolonged depressive reaction 01/22/2014   Visual loss, right eye 07/30/2012   Type 2 diabetes mellitus (HCC) 06/16/2011   Coronary artery disease 06/16/2011   Hypertension associated with diabetes (HCC) 06/16/2011    ONSET DATE: 11/16/2023  REFERRING DIAG: W09.811 (ICD-10-CM) - Acute ischemic left MCA stroke (HCC)   THERAPY DIAG:  Difficulty in walking, not elsewhere classified  Muscle weakness (generalized)  Unsteadiness on feet  Rationale for Evaluation and Treatment: Rehabilitation  SUBJECTIVE:                                                                                                                                                                                             SUBJECTIVE STATEMENT: Pt is a pleasant 83 y/o male referred to PT s/p L MCA  stroke. Pt also seeing speech as well. Pt with very weak voice, at times hx difficult to take as a result. He attempted to ambulate to clinic with 4WW but became too fatigued and required assistance into WC. Pt now presents to PT eval in WC. He reports he primarily uses a 4WW at home, but does not ambulate much during the day (maybe ten minutes total). He reports difficulty with standing up from chairs. He must use grab bars in his bathroom due to difficulty standing up from toilet.  He has difficulty with stairs, requires help getting up step to enter his home. He reports no recent falls, but that he does stumble with his 4WW when fatigued.  Pt accompanied by: self  PERTINENT HISTORY:  Via ED d/c note: "Presented 11/16/2023 to [facility] with right facial droop left gaze preference and dysarthria. CT/MRI showed small acute nonhemorrhagic left MCA distribution infarction involving the left frontal lobe. No associated mass effect. Patient did not receive TNK. CTA showed no large vessel occlusion or evidence of finding amenable to neurovascular intervention... 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 No none currently but does report arthritis pain in L shoulder limiting ability to lift/raise arm  PRECAUTIONS: Fall  RED FLAGS: None   WEIGHT BEARING RESTRICTIONS: No  FALLS: Has patient fallen in last 6 months? No but does report stumbling with fatigue  LIVING ENVIRONMENT: Lives with: lives with their family son and DIL Lives in: House/apartment, two level home but not using upstairs, stays on first floor Stairs:  1 step to enter home, says no handrails but he has help getting up step Has following equipment at home: Walker - 4 wheeled, shower chair, and Grab bars  PLOF: Independent  PATIENT GOALS: everything: walking, balance, strength    OBJECTIVE:  Note: Objective measures were completed at Evaluation unless otherwise noted.  DIAGNOSTIC FINDINGS:  imaging via chart  MR brain 11/16/23: " IMPRESSION: 1. Small acute nonhemorrhagic left MCA distribution infarct involving the left frontal lobe. No associated mass effect. 2. Additional punctate subcentimeter acute ischemic nonhemorrhagic infarct within the contralateral right basal ganglia. 3. Underlying moderately advanced chronic microvascular ischemic disease with a few scattered remote lacunar infarcts as above. 4. Chronic occlusion of the left vertebral artery.     Electronically Signed   By: Virgia Griffins M.D.   On: 11/16/2023 23:48"  CT ANGIO HEAD NECK 11/16/23 " IMPRESSION: No large vessel occlusion or evidence of findings amenable to neurovascular intervention.   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   Multiple intracranial vascular stenoses as above. Focal short segment occlusion of an M2 inferior division branch of the right MCA with reconstitution noted.   Mild-to-moderate stenosis of the V4 segment right vertebral artery.   Emphysema (ICD10-J43.9).     Electronically Signed   By: Denny Flack M.D.   On: 11/16/2023 15:50"  CT HEAD 11/16/23: "IMPRESSION: 1. 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 2. Subtle hypoattenuation at the anterior limb of the right internal capsule. 3. 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 4. Age indeterminate infarct in the left lentiform nucleus. 5. Moderate atrophy and white matter disease likely reflects the sequela of chronic microvascular ischemia. 6. No acute hemorrhage or mass lesion.   These results were called by telephone at the time of  interpretation on 11/16/2023 at 3:13 pm to provider Dr. Doretta Gant, who verbally acknowledged these results.     Electronically Signed   By: Audree Leas M.D.   On: 11/16/2023 15:14"  COGNITION: Overall cognitive status: Within functional limits for tasks assessed but at times difficult to fully determine due to weakness of voice and some difficulty responding  to PT questions as a result   SENSATION: WFL to light touch bilat UE and bilat LE  COORDINATION: WFL rapid alt movement UE and finger chin<>target bilat  WFL rapid alt movement LE and heel>shin bilat  EDEMA:  Pt reports no swelling     POSTURE: slight rounded shoulders, forward head, and increased thoracic kyphosis   LOWER EXTREMITY MMT:    Gross bilat LE strength is 4/5, more weakness found proximal than distal   BED MOBILITY:  Not assessed  TRANSFERS: Initially min a, then pt able to complete CGA   STAIRS: Not assessed but impaired per pt report GAIT: Findings: decreased gait speed (See ), FWD flexed posture with gait, decreased bilat heel strike, more pronounced on R than L side  FUNCTIONAL TESTS:  5 times sit to stand: 44.2 sec with use UE  Timed up and go (TUG): 33 second with 4WW  10 meter walk test: 0.53 m/s with 4WW  Berg Balance Scale: deferred : deferred  PATIENT SURVEYS:  SIS-16: 54                                                                                                                              TREATMENT DATE:   NMR: PT reviewed findings of assessment with pt, indications, current functional level and prognosis    PATIENT EDUCATION: Education details: exam findings, prognosis, goals, plan Person educated: Patient Education method: Explanation Education comprehension: verbalized understanding  HOME EXERCISE PROGRAM: To be initiated next 1-2 visits  GOALS: Goals reviewed with patient? Yes  SHORT TERM GOALS: Target date: 01/18/2024   Patient will be  independent in home exercise program to improve strength/mobility for better functional independence with ADLs. Baseline: Goal status: INITIAL   LONG TERM GOALS: Target date:02/29/2024    Patient will increase SIS-16 score by at least 10 points  to demonstrate increased ease with ADLs and quality of life.  Baseline: 54 Goal status: INITIAL  2.  Patient (> 48 years old) will complete five times sit to stand test in < 15 seconds indicating an increased LE strength and improved balance. Baseline: 44.2 sec with use of BUE Goal status: INITIAL  3.  Patient will increase Berg Balance score by > 6 points to demonstrate decreased fall risk during functional activities Baseline:  Goal status: INITIAL  4.  Patient will increase 10 meter walk test to >1.46m/s as to improve gait speed for better community ambulation and to reduce fall risk. Baseline: 0.53 m/s with 4WW Goal status: INITIAL  5.  Patient will reduce timed up and go to <11 seconds to reduce fall risk and demonstrate improved transfer/gait ability. Baseline: 33 sec with 4WW Goal status: INITIAL  6. Patient will increase six minute walk test distance to >1000 for progression to community ambulator and improve gait ability  Baseline:  Goal status: INITIAL   ASSESSMENT:  CLINICAL IMPRESSION: Patient is a pleasant 83 y.o. male who was seen today for physical  therapy evaluation and treatment for impairments s/p CVA. Exam reveals deficits in strength, endurance, gait, balance, and mobility and ability to complete ADLs per SIS-16. Pt scores AEB 5xSTS and TUG indicate pt is at an increased risk for a future fall. The pt will benefit from further skilled PT to improve deficits in order to increase ease and safety with ADLs and mobility and decrease fall risk.   OBJECTIVE IMPAIRMENTS: Abnormal gait, decreased activity tolerance, decreased balance, decreased endurance, decreased mobility, difficulty walking, decreased strength, impaired UE  functional use, improper body mechanics, postural dysfunction, and pain.   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   REHAB POTENTIAL: Good  CLINICAL DECISION MAKING: Evolving/moderate complexity  EVALUATION COMPLEXITY: Moderate  PLAN:  PT FREQUENCY: 1-2x/week  PT DURATION: 12 weeks  PLANNED INTERVENTIONS: 97164- PT Re-evaluation, 97750- Physical Performance Testing, 97110-Therapeutic exercises, 97530- Therapeutic activity, 97112- Neuromuscular re-education, 97535- Self Care, [id number]- Manual therapy, [phone]- Gait training, [phone]- Orthotic Initial, [phone]- Orthotic/Prosthetic subsequent, [phone]- Canalith repositioning, Patient/Family education, Balance training, Stair training, Taping, Joint mobilization, Spinal mobilization, Vestibular training, DME instructions, Wheelchair mobility training, Cryotherapy, and Moist heat  PLAN FOR NEXT SESSION: BERG and as pt is able, initiate HEP, if time strengthening, gait, balance   Samie Crews, PT 12/07/2023, 4:46 PM

## 2023-12-07 NOTE — Therapy (Signed)
 OUTPATIENT SPEECH LANGUAGE PATHOLOGY  MOTOR SPEECH EVALUATION   Patient Name: [name] [name] MRN: [id number] DOB:[birth year], 83 y.o., male Today's Date: 12/07/2023  PCP: Jerrlyn Morel, MD REFERRING PROVIDER: Georjean Kite, PA-C   End of Session - 12/07/23 1347     Visit Number 1    Number of Visits 25    Date for SLP Re-Evaluation 02/29/24    Authorization Type [employer] HMO/PPO    Progress Note Due on Visit 10    SLP Start Time 1315    SLP Stop Time  1400    SLP Time Calculation (min) 45 min    Activity Tolerance Patient tolerated treatment well             No past medical history on file.  The histories are not reviewed yet. Please review them in the "History" navigator section and refresh this SmartLink. 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   Ischemic cardiomyopathy 06/12/2018   Thrombocytosis 04/16/2016   Adjustment reaction with prolonged depressive reaction 01/22/2014   Visual loss, right eye 07/30/2012   Type 2 diabetes mellitus (HCC) 06/16/2011   Coronary artery disease 06/16/2011   Hypertension associated with diabetes (HCC) 06/16/2011    ONSET DATE: date of CVA 11/16/2023; date referral  12/05/2023  REFERRING DIAG:  I63.512 (ICD-10-CM) - Acute ischemic left MCA stroke (HCC)      THERAPY DIAG:  Dysarthria and anarthria  Cognitive communication deficit  Aphasia  Rationale for Evaluation and Treatment Habilitation  SUBJECTIVE:   SUBJECTIVE STATEMENT: Pt arrived with his son who requested to remain in the lobby. Pt had gait belt on and attempted to ambulate down the hallway to this writer's office using rollator. He began fatigued and SOB half to office. Staff obtained a wheelchair.  Pt accompanied by: family member; pt's son brought him but requested to remain in the lobby  PERTINENT  HISTORY and DIAGNOSTIC FINDINGS: Pt is a 83 y.o. male who presented to [facility] ED on 11/16/2023 with stroke symptoms. 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 Additional punctate subcentimeter acute ischemic nonhemorrhagic infarct was also present within the contralateral right basal ganglia. He has a PMH of DM2, HTN, HLD, obstructive CAD s/p BMS to mid LAD (2012), ischemic cardiomyopathy, chronic thrombocytosis, frequent PVCs Pt admitted to CIR on 11/21/2023 thru 12/05/2023.  CT scan revealed - Upper chest: Centrilobular emphysema in the visualized upper lobes with additional paraseptal emphysema in the lung apices most pronounced on the right.    PAIN:  Are you having pain? No   FALLS: Has patient fallen in last 6 months?  See PT evaluation for details  LIVING ENVIRONMENT: Lives with: lives with their family: pt lived independently prior to CVA with his brother, currently lives with his son [name]) and his daughter-in-law [name]) post stroke, she daughter [name]) stays with him during the day while his son and DIL work Lives in: House/apartment  PLOF:  Level of assistance: Independent with ADLs, Independent with IADLs; pt reports finishing the 10th grade Employment: Retired   PATIENT GOALS      unable to state  OBJECTIVE:  COGNITION: Overall cognitive status: Difficulty to assess due to: Communication impairment and no family present Functional deficits: Pt is oriented x 4, able to follow 1-2 step directions, has good selective attention to task,  with increased time he is able to perform basic problem solving - severely reduced speech intelligibility interfers with further cognitive assessment   MOTOR SPEECH: Overall motor speech: impaired Level of impairment: Word Respiration: clavicular breathing and speaking on residual capacity Phonation: aphonic, breathy, and low vocal intensity Resonance: WFL Articulation: Impaired:  phrase Intelligibility: Intelligibility reduced Motor planning: Appears intact Effective technique: increased vocal intensity  ORAL MOTOR EXAMINATION: Facial : Symmetry impaired: Impaired right, Strength impaired: Impaired right Lingual: WFL Velum: WFL Mandible: WFL Cough: Non-Productive Voice: Breathy, Weak    RESPIRATION: Reduced breath support, Clavicular-centered breathing, Shallow, and Other:difficulty coordination respiration and phonation  PHONATION:  Voice quality: breathy, low vocal intensity, vocal fatigue, and aphonic  Sustained "ah" maximum phonation time: 2 seconds Sustained "ah" loudness average: 65 dB Average fundamental frequency during sustained "ah":140 Hz   (average of  145 Hz +/- 23 for gender)  Stimulability trials: Given SLP modeling and consistent max cues, loudness average increased to 75dB x 3 at loud "ah", word level.  Comments: Pt is not aware of reduce volume and phonation, audioplay back helpful but pt voices reluctance to use "louder voice" because he thinks he is "yelling"     RESONANCE: WNL    PATIENT REPORTED OUTCOME MEASURES (PROM): To be completed over next 3 session - requested pt have his daughter potentially attend session as she stays with him throughout the day for improved carry over of recommendations and HEP completion   TODAY'S TREATMENT:  With maximal multi-modal cues, pt able to increase his vocal intensity to achieve ~ 50% speech intelligibility at the word level in 4 out of 20 opportunities (75 dB)    PATIENT EDUCATION: Education details: results of this assessment, ST POC Person educated: Patient Education method: Explanation Education comprehension: needs further education   HOME EXERCISE PROGRAM: Practice EMST - pt has device  GOALS: Goals reviewed with patient? Yes  SHORT TERM GOALS: Target date: 10 sessions  Pt will complete EMST w/ modA to facilitate increased breath support during speech production   Baseline: Goal status: INITIAL  2.  With maximal A, pt will recall compensatory speech strategies in 3/4 opportunities. Baseline:  Goal status: INITIAL  3.  With maximal A, pt will utilize compensatory speech strategies at phrase level to maintain 50% intelligibility. Baseline:  Goal status: INITIAL   LONG TERM GOALS: Target date: 02/29/2024  Pt will complete EMST with Min A to facilitate increased breath support for speech production.  Baseline:  Goal status: INITIAL  2.  With Moderate A, pt will utilize speech intelligibility strategies to achieve 75% speech intelligibility at the phrase level in a quiet environment to when expressing basic wants/needs.  Baseline:  Goal status: INITIAL   ASSESSMENT:  CLINICAL IMPRESSION: Patient is a 83 y.o. male who was seen today for a speech language evaluation. Pt's expressive and receptive language abilities appear functional when communication basic wants/needs. Will continue to assess these abilities as well as cognitive communication abilities as speech intelligibility increases. Pt's most impairing deficits is his profound dysarthria that is c/b decreased respiratory support for phonation results in difficulty coordinating respiration and phonation and reduced vocal intensity. His speech intelligibility is subsequently reduced to ~ 25% at the phrase level. Pt with reduced awareness and slow progress which reduces his prognosis as well as diagnose of pulmonary disease in chart "Upper chest: Centrilobular emphysema in the visualized upper lobes with additional paraseptal emphysema in the lung apices most pronounced on the right."   OBJECTIVE IMPAIRMENTS include  dysphagia. These impairments are limiting patient from effectively communicating at home and in community. Factors affecting potential to achieve goals and functional outcome are severity of impairments and potential pulmonary disease . Patient will benefit from skilled SLP services to  address above impairments and improve overall function.  REHAB POTENTIAL: Fair slow progress, reduced awareness, pulmonary disease  PLAN: SLP FREQUENCY: 1-2x/week  SLP DURATION: 12 weeks  PLANNED INTERVENTIONS: SLP instruction and feedback, Compensatory strategies, and Patient/family education    Saliah Crisp B. Garlin Junker, M.S., CCC-SLP, Tree surgeon Certified Brain Injury Specialist [facility]  [facility] Rehabilitation Services Office [phone] Ascom [phone] Fax [phone]

## 2023-12-11 ENCOUNTER — Ambulatory Visit: Admitting: Speech Pathology

## 2023-12-11 ENCOUNTER — Ambulatory Visit: Admitting: Physical Therapy

## 2023-12-11 ENCOUNTER — Encounter: Admitting: Speech Pathology

## 2023-12-11 DIAGNOSIS — R471 Dysarthria and anarthria: Secondary | ICD-10-CM | POA: Diagnosis not present

## 2023-12-11 DIAGNOSIS — R2681 Unsteadiness on feet: Secondary | ICD-10-CM

## 2023-12-11 DIAGNOSIS — M6281 Muscle weakness (generalized): Secondary | ICD-10-CM

## 2023-12-11 DIAGNOSIS — R262 Difficulty in walking, not elsewhere classified: Secondary | ICD-10-CM

## 2023-12-11 NOTE — Therapy (Signed)
 OUTPATIENT SPEECH LANGUAGE PATHOLOGY  Treatment Note   Patient Name: [name] [name] MRN: [id number] DOB:1940/10/12, 83 y.o., male Today's Date: 12/11/2023  PCP: Jerrlyn Morel, MD REFERRING PROVIDER: Georjean Kite, PA-C   End of Session - 12/11/23 0934     Visit Number 2    Number of Visits 25    Date for SLP Re-Evaluation 02/29/24    Authorization Type [employer] Medicare HMO/PPO    Progress Note Due on Visit 10    SLP Start Time 0930    SLP Stop Time  1015    SLP Time Calculation (min) 45 min    Activity Tolerance Patient tolerated treatment well             No past medical history on file.  The histories are not reviewed yet. Please review them in the "History" navigator section and refresh this SmartLink. 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   Ischemic cardiomyopathy 06/12/2018   Thrombocytosis 04/16/2016   Adjustment reaction with prolonged depressive reaction 01/22/2014   Visual loss, right eye 07/30/2012   Type 2 diabetes mellitus (HCC) 06/16/2011   Coronary artery disease 06/16/2011   Hypertension associated with diabetes (HCC) 06/16/2011    ONSET DATE: date of CVA 11/16/2023; date referral  12/05/2023  REFERRING DIAG:  W09.811 (ICD-10-CM) - Acute ischemic left MCA stroke (HCC)      THERAPY DIAG:  Dysarthria and anarthria  Rationale for Evaluation and Treatment Habilitation  SUBJECTIVE:   PERTINENT HISTORY and DIAGNOSTIC FINDINGS: Pt is a 83 y.o. male who presented to [facility] ED on 11/16/2023 with stroke symptoms. 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 Additional punctate subcentimeter acute ischemic nonhemorrhagic infarct was also present within the contralateral right basal ganglia. 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  CT scan revealed - Upper chest: Centrilobular emphysema in the visualized upper lobes with additional paraseptal emphysema in the lung apices most pronounced on the right.    PAIN:  Are you having pain? No   FALLS: Has patient fallen in last 6 months?  See PT evaluation for details  LIVING ENVIRONMENT: Lives with: lives with their family: pt lived independently prior to CVA with his brother, currently lives with his son [name]) and his daughter-in-law [name]) post stroke, she daughter [name]) stays with him during the day while his son and DIL work Lives in: House/apartment  PLOF:  Level of assistance: Independent with ADLs, Independent with IADLs; pt reports finishing the 10th grade Employment: Retired   PATIENT GOALS      unable to state  SUBJECTIVE STATEMENT: Pt attended session following PT, "my son went to smoke" Pt accompanied by: self  OBJECTIVE:   TODAY'S TREATMENT:  Skilled treatment session focused on pt's dysarthria goals. SLP facilitated session by providing the following interventions:  Pt brought in his EMST 75. Although he reports little respiratory effort, he required maximal cues to produced a quick burst of air  with device set at 25 cmH2O d/t inability to blow into device.   During phrase length reading, pt reports 'I tried to talk louder but I couldn't" d/t report of sensation of being out of breath. During session, pt struggled  to coordinate phonation with coordination and was observed speaking on residual capacity. HOWEVER, when cued to "talk you did before your stroke" pt immediately began reading the phrases with improved vocal intensity and > 95% speech intelligibility.   This cue appeared effective in pt's ability to improve speech intelligibility. Information and this cue were written on paper for pt to take home and have his  daughter help him practice.   PATIENT EDUCATION: Education details: See above Person educated: Patient Education method: Explanation Education comprehension: needs further education   HOME EXERCISE PROGRAM: Practice EMST - pt has device  GOALS: Goals reviewed with patient? Yes  SHORT TERM GOALS: Target date: 10 sessions  Pt will complete EMST w/ modA to facilitate increased breath support during speech production  Baseline: Goal status: INITIAL  2.  With maximal A, pt will recall compensatory speech strategies in 3/4 opportunities. Baseline:  Goal status: INITIAL  3.  With maximal A, pt will utilize compensatory speech strategies at phrase level to maintain 50% intelligibility. Baseline:  Goal status: INITIAL   LONG TERM GOALS: Target date: 02/29/2024  Pt will complete EMST with Min A to facilitate increased breath support for speech production.  Baseline:  Goal status: INITIAL  2.  With Moderate A, pt will utilize speech intelligibility strategies to achieve 75% speech intelligibility at the phrase level in a quiet environment to when expressing basic wants/needs.  Baseline:  Goal status: INITIAL   ASSESSMENT:  CLINICAL IMPRESSION: Patient is a 83 y.o. male who was seen today for a speech language treatment. Pt's expressive and receptive language abilities appear functional when communication basic wants/needs. Will continue to assess these abilities as well as cognitive communication abilities as speech intelligibility increases. Pt's most impairing deficits is his profound dysarthria that is c/b decreased respiratory support for phonation results in difficulty coordinating respiration and phonation and reduced vocal intensity. His speech intelligibility is subsequently reduced to ~ 25% at the phrase level. Pt with reduced awareness and slow progress which reduces his prognosis as well as diagnose of pulmonary disease in chart "Upper chest: Centrilobular emphysema in the  visualized upper lobes with additional paraseptal emphysema in the lung apices most pronounced on the right."   Pt appeared eager and engaged throughout the session. Continue to be concerned regarding prognosis d/t lack of caregiver being able to attend sessions. Written instructions/information for HEP provided to help with carryover. See the above treatment note for details.   OBJECTIVE IMPAIRMENTS include dysphagia. These impairments are limiting patient from effectively communicating at home and in community. Factors affecting potential to achieve goals and functional outcome are severity of impairments and potential pulmonary disease . Patient will benefit from skilled SLP services to address above impairments and improve overall function.  REHAB POTENTIAL: Fair slow progress, reduced awareness, pulmonary disease  PLAN: SLP FREQUENCY: 1-2x/week  SLP DURATION: 12 weeks  PLANNED INTERVENTIONS: SLP instruction and feedback, Compensatory strategies, and Patient/family education    Huxley Shurley B. Garlin Junker, M.S., CCC-SLP, Tree surgeon Certified Brain Injury Specialist [facility]  [facility] Rehabilitation Services Office [phone] Ascom [phone] Fax [phone]

## 2023-12-11 NOTE — Therapy (Signed)
 OUTPATIENT PHYSICAL THERAPY NEURO TREATMENT   Patient Name: [name] [name] MRN: [id number] DOB:07/17/1941, 83 y.o., male Today's Date: 12/11/2023   PCP: Al Hover, MD REFERRING PROVIDER:   Sterling Eisenmenger, PA-C    END OF SESSION:  PT End of Session - 12/11/23 0848     Visit Number 2    Number of Visits 25    Date for PT Re-Evaluation 02/29/24    Progress Note Due on Visit 10    PT Start Time 0848    PT Stop Time 0929    PT Time Calculation (min) 41 min    Equipment Utilized During Treatment Gait belt    Activity Tolerance Patient tolerated treatment well    Behavior During Therapy Flat affect              Past Medical History:  Diagnosis Date   Arthritis    Depression    Diabetes mellitus without complication (HCC)    Gout    Hyperlipidemia    Hypertension    Ischemic cardiomyopathy    No past surgical history on file. 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   Ischemic cardiomyopathy 06/12/2018   Thrombocytosis 04/16/2016   Adjustment reaction with prolonged depressive reaction 01/22/2014   Visual loss, right eye 07/30/2012   Type 2 diabetes mellitus (HCC) 06/16/2011   Coronary artery disease 06/16/2011   Hypertension associated with diabetes (HCC) 06/16/2011    ONSET DATE: 11/16/2023  REFERRING DIAG: W09.811 (ICD-10-CM) - Acute ischemic left MCA stroke (HCC)   THERAPY DIAG:  Difficulty in walking, not elsewhere classified  Muscle weakness (generalized)  Unsteadiness on feet  Rationale for Evaluation and Treatment: Rehabilitation  SUBJECTIVE:                                                                                                                                                                                             SUBJECTIVE STATEMENT:  Pt reports no changes since last  session. Had an easy weekend watching TV.  from eval: Pt is a pleasant 83 y/o male referred to PT s/p L MCA stroke. Pt also seeing speech as well. Pt with very weak voice, at times hx difficult to take as a result. He attempted to ambulate to clinic with 4WW but became too fatigued and required assistance into WC. Pt now presents to PT eval in WC. He reports he primarily uses a 4WW at home, but does not ambulate much during the day (  maybe ten minutes total). He reports difficulty with standing up from chairs. He must use grab bars in his bathroom due to difficulty standing up from toilet. He has difficulty with stairs, requires help getting up step to enter his home. He reports no recent falls, but that he does stumble with his 4WW when fatigued.  Pt accompanied by: self  PERTINENT HISTORY:  Via ED d/c note: "Presented 11/16/2023 to [facility] with right facial droop left gaze preference and dysarthria. CT/MRI showed small acute nonhemorrhagic left MCA distribution infarction involving the left frontal lobe. No associated mass effect. Patient did not receive TNK. CTA showed no large vessel occlusion or evidence of finding amenable to neurovascular intervention... 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 No none currently but does report arthritis pain in L shoulder limiting ability to lift/raise arm  PRECAUTIONS: Fall  RED FLAGS: None   WEIGHT BEARING RESTRICTIONS: No  FALLS: Has patient fallen in last 6 months? No but does report stumbling with fatigue  LIVING ENVIRONMENT: Lives with: lives with their family son and DIL Lives in: House/apartment, two level home but not using upstairs, stays on first floor Stairs:  1 step to enter home, says no handrails but he has help getting up step Has following equipment at home: Walker - 4 wheeled,  shower chair, and Grab bars  PLOF: Independent  PATIENT GOALS: everything: walking, balance, strength   OBJECTIVE:  Note: Objective measures were completed at Evaluation unless otherwise noted.  DIAGNOSTIC FINDINGS:  imaging via chart  MR brain 11/16/23: " IMPRESSION: 1. Small acute nonhemorrhagic left MCA distribution infarct involving the left frontal lobe. No associated mass effect. 2. Additional punctate subcentimeter acute ischemic nonhemorrhagic infarct within the contralateral right basal ganglia. 3. Underlying moderately advanced chronic microvascular ischemic disease with a few scattered remote lacunar infarcts as above. 4. Chronic occlusion of the left vertebral artery.     Electronically Signed   By: Virgia Griffins M.D.   On: 11/16/2023 23:48"  CT ANGIO HEAD NECK 11/16/23 " IMPRESSION: No large vessel occlusion or evidence of findings amenable to neurovascular intervention.   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   Multiple intracranial vascular stenoses as above. Focal short segment occlusion of an M2 inferior division branch of the right MCA with reconstitution noted.   Mild-to-moderate stenosis of the V4 segment right vertebral artery.   Emphysema (ICD10-J43.9).     Electronically Signed   By: Denny Flack M.D.   On: 11/16/2023 15:50"  CT HEAD 11/16/23: "IMPRESSION: 1. 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 2. Subtle hypoattenuation at the anterior limb of the right internal capsule. 3. 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 4. Age indeterminate infarct in the left lentiform nucleus. 5. Moderate atrophy and white matter disease likely reflects the sequela of chronic microvascular  ischemia. 6. No acute hemorrhage or mass lesion.   These results were called by telephone at the time of interpretation on 11/16/2023 at 3:13 pm to provider Dr. Doretta Gant, who verbally acknowledged these results.     Electronically Signed   By: Audree Leas M.D.   On: 11/16/2023 15:14"  COGNITION: Overall cognitive status: Within functional limits for tasks assessed but at times difficult to fully determine due to weakness of voice and some difficulty responding to PT questions as a result   SENSATION: WFL to light touch bilat UE and bilat LE  COORDINATION: WFL rapid alt movement UE and finger chin<>target bilat  WFL rapid alt movement LE and heel>shin bilat  EDEMA:  Pt reports no swelling     POSTURE: slight rounded shoulders, forward head, and increased thoracic kyphosis   LOWER EXTREMITY MMT:    Gross bilat LE strength is 4/5, more weakness found proximal than distal   BED MOBILITY:  Not assessed  TRANSFERS: Initially min a, then pt able to complete CGA   STAIRS: Not assessed but impaired per pt report GAIT: Findings: decreased gait speed (See ), FWD flexed posture with gait, decreased bilat heel strike, more pronounced on R than L side  FUNCTIONAL TESTS:  5 times sit to stand: 44.2 sec with use UE  Timed up and go (TUG): 33 second with 4WW  10 meter walk test: 0.53 m/s with 4WW  Berg Balance Scale: deferred : deferred  PATIENT SURVEYS:  SIS-16: 54                                                                                                                              TREATMENT DATE:  PHYSICAL PERFORMANCE 6 Min Walk Test:  Instructed patient to ambulate as quickly and as safely as possible for 6 minutes using LRAD. Patient was allowed to take standing rest breaks without stopping the test, but if the patient required a sitting rest break the clock would be stopped and the test would be over.  Results: 310 feet using a 4WW with CGA and  only completes 3:17 sec prior to requesting to sit secondary to fatigue. Results indicate that the patient has reduced endurance with ambulation compared to age matched norms.  Age Matched Norms (in meters): 62-69 yo M: 88 F: 69, 90-79 yo M: 33 F: 471, 2-89 yo M: 417 F: 392 MDC: 58.21 meters (190.98 feet) or 50 meters (ANPTA Core Set of Outcome Measures for Adults with Neurologic Conditions, 2018) Patient demonstrates increased fall risk as noted by score of  34 /56 on Berg Balance Scale.  (<36= high risk for falls, close to 100%; 37-45 significant >80%; 46-51 moderate >50%; 52-55 lower >25%)   [facility] PT Assessment - 12/11/23 0001       Standardized Balance Assessment   Standardized Balance Assessment Berg Balance Test      Berg Balance Test   Sit to Stand Able to stand  independently using hands    Standing Unsupported Able to stand 2 minutes with supervision    Sitting with Back Unsupported but Feet Supported on Floor or Stool Able to sit safely and securely 2 minutes    Stand to Sit Controls descent by using hands    Transfers Able to transfer safely,  definite need of hands    Standing Unsupported with Eyes Closed Able to stand 10 seconds with supervision    Standing Unsupported with Feet Together Able to place feet together independently and stand for 1 minute with supervision    From Standing, Reach Forward with Outstretched Arm Can reach forward >5 cm safely (2")    From Standing Position, Pick up Object from Floor Able to pick up shoe, needs supervision    From Standing Position, Turn to Look Behind Over each Shoulder Turn sideways only but maintains balance    Turn 360 Degrees Needs close supervision or verbal cueing    Standing Unsupported, Alternately Place Feet on Step/Stool Able to complete 4 steps without aid or supervision    Standing Unsupported, One Foot in Front Able to take small step independently and hold 30 seconds    Standing on One Leg Unable to try or needs assist  to prevent fall    Total Score 34           TE Exercises - Seated March  - 2 sets - 10 reps - Seated Long Arc Quad   - 2 sets - 10 reps - 3 sec hold - Standing Knee Flexion AROM with Chair Support  - - 2 sets - 10 reps - Wide tandem with counter support as needed   3 sets - 30 sec  hold Self Care - instruction in sit to stand from rollator. 1 attempt pt does no shit weight forward enough for ind completing, min A form pt for completion.   TA Gait with rollator and cues for R foot clearance x 100 ft to start of SLP session and to SLP office    PATIENT EDUCATION: Education details: exam findings, prognosis, goals, plan Person educated: Patient Education method: Explanation Education comprehension: verbalized understanding  HOME EXERCISE PROGRAM: Access Code: [email or web address] URL: [email or web address] Date: 12/11/2023 Prepared by: Marlynn Singer  Exercises - Seated March  - 1 x daily - 7 x weekly - 2 sets - 10 reps - Seated Long Arc Quad  - 1 x daily - 7 x weekly - 2 sets - 10 reps - 3 sec hold - Standing Knee Flexion AROM with Chair Support  - 1 x daily - 7 x weekly - 2 sets - 10 reps - Wide tandem with counter support as needed   - 1 x daily - 7 x weekly - 3 sets - 30 sec  hold  GOALS: Goals reviewed with patient? Yes  SHORT TERM GOALS: Target date: 01/18/2024   Patient will be independent in home exercise program to improve strength/mobility for better functional independence with ADLs. Baseline: Goal status: INITIAL   LONG TERM GOALS: Target date:02/29/2024    Patient will increase SIS-16 score by at least 10 points  to demonstrate increased ease with ADLs and quality of life.  Baseline: 54 Goal status: INITIAL  2.  Patient (> 67 years old) will complete five times sit to stand test in < 15 seconds indicating an increased LE strength and improved balance. Baseline: 44.2 sec with use of BUE Goal status: INITIAL  3.  Patient will increase Berg  Balance score by > 6 points to demonstrate decreased fall risk during functional activities Baseline: 34 Goal status: INITIAL  4.  Patient will increase 10 meter walk test to >1.96m/s as to improve gait speed for better community ambulation and to reduce fall risk. Baseline: 0.53 m/s with 4WW Goal status: INITIAL  5.  Patient will reduce  timed up and go to <11 seconds to reduce fall risk and demonstrate improved transfer/gait ability. Baseline: 33 sec with 4WW Goal status: INITIAL  6. Patient will increase six minute walk test distance to >1000 for progression to community ambulator and improve gait ability  Baseline: 310 feet using a 4WW with CGA and only completes 3:17 sec prior to requesting to sit secondary to fatigue Goal status: INITIAL   ASSESSMENT:  CLINICAL IMPRESSION: Pt presents for ongoing assessment of balance and mobility. BERG and indicate significant risk for falls and significantly decreased community mobility safety and efficacy respectively. Pt provided with initial Hep to address proximal strength deficits as well as to address balance and HEP provided. PT stresses importance of HEP and movement in his recovery. Pt will continue to benefit from skilled physical therapy intervention to address impairments, improve QOL, and attain therapy goals.    OBJECTIVE IMPAIRMENTS: Abnormal gait, decreased activity tolerance, decreased balance, decreased endurance, decreased mobility, difficulty walking, decreased strength, impaired UE functional use, improper body mechanics, postural dysfunction, and pain.   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   REHAB POTENTIAL: Good  CLINICAL DECISION MAKING: Evolving/moderate complexity  EVALUATION COMPLEXITY: Moderate  PLAN:  PT FREQUENCY: 1-2x/week  PT DURATION: 12 weeks  PLANNED INTERVENTIONS: 97164- PT Re-evaluation, 97750- Physical Performance Testing, 97110-Therapeutic exercises, 97530- Therapeutic activity, [id number]- Neuromuscular re-education, 97535- Self Care, 16109- Manual therapy, [id number]- Gait training, [phone]- Orthotic Initial, [phone]- Orthotic/Prosthetic subsequent, [phone]- Canalith repositioning, Patient/Family education, Balance training, Stair training, Taping, Joint mobilization, Spinal mobilization, Vestibular training, DME instructions, Wheelchair mobility training, Cryotherapy, and Moist heat  PLAN FOR NEXT SESSION: gait, balance, progress HEP as indicated.    Edwina Gram, PT 12/11/2023, 8:48 AM

## 2023-12-13 ENCOUNTER — Ambulatory Visit: Admitting: Physical Therapy

## 2023-12-13 ENCOUNTER — Ambulatory Visit: Admitting: Speech Pathology

## 2023-12-13 DIAGNOSIS — R471 Dysarthria and anarthria: Secondary | ICD-10-CM | POA: Diagnosis not present

## 2023-12-13 DIAGNOSIS — R262 Difficulty in walking, not elsewhere classified: Secondary | ICD-10-CM

## 2023-12-13 DIAGNOSIS — R2681 Unsteadiness on feet: Secondary | ICD-10-CM

## 2023-12-13 DIAGNOSIS — M6281 Muscle weakness (generalized): Secondary | ICD-10-CM

## 2023-12-13 NOTE — Therapy (Signed)
 OUTPATIENT SPEECH LANGUAGE PATHOLOGY  Treatment Note   Patient Name: [name] [name] MRN: [id number] DOB:18-Oct-1940, 83 y.o., male Today's Date: 12/13/2023  PCP: Jerrlyn Morel, MD REFERRING PROVIDER: Georjean Kite, PA-C   End of Session - 12/13/23 1135     Visit Number 3    Number of Visits 25    Date for SLP Re-Evaluation 02/29/24    Authorization Type [employer] HMO/PPO    Progress Note Due on Visit 10    SLP Start Time 1145    SLP Stop Time  1215    SLP Time Calculation (min) 30 min    Activity Tolerance Patient tolerated treatment well             No past medical history on file.  The histories are not reviewed yet. Please review them in the "History" navigator section and refresh this SmartLink. 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   Ischemic cardiomyopathy 06/12/2018   Thrombocytosis 04/16/2016   Adjustment reaction with prolonged depressive reaction 01/22/2014   Visual loss, right eye 07/30/2012   Type 2 diabetes mellitus (HCC) 06/16/2011   Coronary artery disease 06/16/2011   Hypertension associated with diabetes (HCC) 06/16/2011    ONSET DATE: date of CVA 11/16/2023; date referral  12/05/2023  REFERRING DIAG:  W09.811 (ICD-10-CM) - Acute ischemic left MCA stroke (HCC)      THERAPY DIAG:  Dysarthria and anarthria  Rationale for Evaluation and Treatment Habilitation  SUBJECTIVE:   PERTINENT HISTORY and DIAGNOSTIC FINDINGS: Pt is a 83 y.o. male who presented to [facility] ED on 11/16/2023 with stroke symptoms. 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 Additional punctate subcentimeter acute ischemic nonhemorrhagic infarct was also present within the contralateral right basal ganglia. 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  CT scan revealed - Upper chest: Centrilobular emphysema in the visualized upper lobes with additional paraseptal emphysema in the lung apices most pronounced on the right.    PAIN:  Are you having pain? No   FALLS: Has patient fallen in last 6 months?  See PT evaluation for details  LIVING ENVIRONMENT: Lives with: lives with their family: pt lived independently prior to CVA with his brother, currently lives with his son [name]) and his daughter-in-law [name]) post stroke, she daughter [name]) stays with him during the day while his son and DIL work Lives in: House/apartment  PLOF:  Level of assistance: Independent with ADLs, Independent with IADLs; pt reports finishing the 10th grade Employment: Retired   PATIENT GOALS      unable to state  SUBJECTIVE STATEMENT: Pt attended session following PT, "may I have some water" Pt accompanied by: self  OBJECTIVE:   TODAY'S TREATMENT:  Skilled treatment session focused on pt's dysarthria goals. SLP facilitated session by providing the following interventions:  Pt reports completion of HEP "my daughter is a Printmaker"  Pt with increased difficulty completing EMST exercises with device set at 35 cm H2O - increased difficulty blowing effectively thru device - resistance decreased to 15 cm H2O with increased pt ability - able ot accurately complete 3 sets of 10, left at 15 cm H2O  During phrase length reading and off-the-cuff  comments/conversation, pt presents with independent improvement and improved speech intelligibility of ~ 75%. With Min A cues (specifically for "use your before the stroke speech") pt able to increase speech intelligibility even further to ~ 90% when reading functional phrases. This cue appeared effective in pt's ability to improve speech intelligibility.   PATIENT EDUCATION: Education  details: See above Person educated: Patient Education method: Explanation Education comprehension: needs further education   HOME EXERCISE PROGRAM: Practice EMST - pt has device Using "before your stroke speech" cue when reading functional phrases  GOALS: Goals reviewed with patient? Yes  SHORT TERM GOALS: Target date: 10 sessions  Pt will complete EMST w/ modA to facilitate increased breath support during speech production  Baseline: Goal status: INITIAL  2.  With maximal A, pt will recall compensatory speech strategies in 3/4 opportunities. Baseline:  Goal status: INITIAL  3.  With maximal A, pt will utilize compensatory speech strategies at phrase level to maintain 50% intelligibility. Baseline:  Goal status: INITIAL   LONG TERM GOALS: Target date: 02/29/2024  Pt will complete EMST with Min A to facilitate increased breath support for speech production.  Baseline:  Goal status: INITIAL  2.  With Moderate A, pt will utilize speech intelligibility strategies to achieve 75% speech intelligibility at the phrase level in a quiet environment to when expressing basic wants/needs.  Baseline:  Goal status: INITIAL   ASSESSMENT:  CLINICAL IMPRESSION: Patient is a 83 y.o. male who was seen today for a speech language treatment. Pt's expressive and receptive language abilities appear functional when communication basic wants/needs. Will continue to assess these abilities as well as cognitive communication abilities as speech intelligibility increases. Pt's most impairing deficits is his profound dysarthria that is c/b decreased respiratory support for phonation results in difficulty coordinating respiration and phonation and reduced vocal intensity. His speech intelligibility is subsequently reduced to ~ 25% at the phrase level. Pt with reduced awareness and slow progress which reduces his prognosis as well as diagnose of pulmonary disease in chart "Upper chest: Centrilobular  emphysema in the visualized upper lobes with additional paraseptal emphysema in the lung apices most pronounced on the right."   Pt appeared eager and engaged throughout the session. Good improvement observed during today's session.  See the above treatment note for details.   OBJECTIVE IMPAIRMENTS include dysphagia. These impairments are limiting patient from effectively communicating at home and in community. Factors affecting potential to achieve goals and functional outcome are severity of impairments and potential pulmonary disease . Patient will benefit from skilled SLP services to address above impairments and improve overall function.  REHAB POTENTIAL: Fair slow progress, reduced awareness, pulmonary disease  PLAN: SLP FREQUENCY: 1-2x/week  SLP DURATION: 12 weeks  PLANNED INTERVENTIONS: SLP instruction and feedback, Compensatory strategies, and Patient/family education    Almalik Weissberg B. Garlin Junker, M.S., CCC-SLP, Tree surgeon Certified Brain Injury Specialist [facility]  [facility] Office [phone] Ascom [phone] Fax [phone]

## 2023-12-13 NOTE — Therapy (Signed)
 OUTPATIENT PHYSICAL THERAPY NEURO TREATMENT   Patient Name: [name] [name] MRN: [id number] DOB:[birth year], 83 y.o., male Today's Date: 12/13/2023   PCP: Al Hover, MD REFERRING PROVIDER:   Sterling Eisenmenger, PA-C    END OF SESSION:     Past Medical History:  Diagnosis Date   Arthritis    Depression    Diabetes mellitus without complication (HCC)    Gout    Hyperlipidemia    Hypertension    Ischemic cardiomyopathy    No past surgical history on file. 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   Ischemic cardiomyopathy 06/12/2018   Thrombocytosis 04/16/2016   Adjustment reaction with prolonged depressive reaction 01/22/2014   Visual loss, right eye 07/30/2012   Type 2 diabetes mellitus (HCC) 06/16/2011   Coronary artery disease 06/16/2011   Hypertension associated with diabetes (HCC) 06/16/2011    ONSET DATE: 11/16/2023  REFERRING DIAG: Y86.578 (ICD-10-CM) - Acute ischemic left MCA stroke (HCC)   THERAPY DIAG:  No diagnosis found.  Rationale for Evaluation and Treatment: Rehabilitation  SUBJECTIVE:                                                                                                                                                                                             SUBJECTIVE STATEMENT:  Pt reports no changes since last session. No falls or anything.   from eval: Pt is a pleasant 83 y/o male referred to PT s/p L MCA stroke. Pt also seeing speech as well. Pt with very weak voice, at times hx difficult to take as a result. He attempted to ambulate to clinic with 4WW but became too fatigued and required assistance into WC. Pt now presents to PT eval in WC. He reports he primarily uses a 4WW at home, but does not ambulate much during the day (maybe ten minutes total). He reports difficulty with  standing up from chairs. He must use grab bars in his bathroom due to difficulty standing up from toilet. He has difficulty with stairs, requires help getting up step to enter his home. He reports no recent falls, but that he does stumble with his 4WW when fatigued.  Pt accompanied by: self  PERTINENT HISTORY:  Via ED d/c note: "Presented 11/16/2023 to [facility] with right facial droop left gaze preference and dysarthria. CT/MRI showed small acute nonhemorrhagic left MCA distribution infarction involving the left frontal lobe. No associated mass effect. Patient did not receive TNK. CTA showed no large  vessel occlusion or evidence of finding amenable to neurovascular intervention... 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 No none currently but does report arthritis pain in L shoulder limiting ability to lift/raise arm  PRECAUTIONS: Fall  RED FLAGS: None   WEIGHT BEARING RESTRICTIONS: No  FALLS: Has patient fallen in last 6 months? No but does report stumbling with fatigue  LIVING ENVIRONMENT: Lives with: lives with their family son and DIL Lives in: House/apartment, two level home but not using upstairs, stays on first floor Stairs:  1 step to enter home, says no handrails but he has help getting up step Has following equipment at home: Walker - 4 wheeled, shower chair, and Grab bars  PLOF: Independent  PATIENT GOALS: everything: walking, balance, strength   OBJECTIVE:  Note: Objective measures were completed at Evaluation unless otherwise noted.  DIAGNOSTIC FINDINGS:  imaging via chart  MR brain 11/16/23: " IMPRESSION: 1. Small acute nonhemorrhagic left MCA distribution infarct involving the left frontal lobe. No associated mass effect. 2. Additional punctate subcentimeter acute ischemic nonhemorrhagic infarct within the  contralateral right basal ganglia. 3. Underlying moderately advanced chronic microvascular ischemic disease with a few scattered remote lacunar infarcts as above. 4. Chronic occlusion of the left vertebral artery.     Electronically Signed   By: Virgia Griffins M.D.   On: 11/16/2023 23:48"  CT ANGIO HEAD NECK 11/16/23 " IMPRESSION: No large vessel occlusion or evidence of findings amenable to neurovascular intervention.   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   Multiple intracranial vascular stenoses as above. Focal short segment occlusion of an M2 inferior division branch of the right MCA with reconstitution noted.   Mild-to-moderate stenosis of the V4 segment right vertebral artery.   Emphysema (ICD10-J43.9).     Electronically Signed   By: Denny Flack M.D.   On: 11/16/2023 15:50"  CT HEAD 11/16/23: "IMPRESSION: 1. 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 2. Subtle hypoattenuation at the anterior limb of the right internal capsule. 3. 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 4. Age indeterminate infarct in the left lentiform nucleus. 5. Moderate atrophy and white matter disease likely reflects the sequela of chronic microvascular ischemia. 6. No acute hemorrhage or mass lesion.   These results were called by telephone at the time of interpretation on 11/16/2023 at 3:13 pm to provider Dr. Doretta Gant, who verbally acknowledged these results.     Electronically Signed   By: Audree Leas M.D.   On: 11/16/2023 15:14"  COGNITION: Overall cognitive status: Within functional limits for tasks assessed but at times difficult to fully determine due to weakness of voice and some difficulty responding to PT questions  as a result   SENSATION: WFL to light touch bilat UE and bilat LE  COORDINATION: WFL rapid alt movement UE and finger chin<>target bilat  WFL rapid alt movement LE and heel>shin bilat  EDEMA:  Pt reports no swelling     POSTURE: slight rounded shoulders, forward head, and increased thoracic kyphosis   LOWER EXTREMITY MMT:    Gross bilat LE strength is 4/5, more weakness found proximal than distal   BED  MOBILITY:  Not assessed  TRANSFERS: Initially min a, then pt able to complete CGA   STAIRS: Not assessed but impaired per pt report GAIT: Findings: decreased gait speed (See ), FWD flexed posture with gait, decreased bilat heel strike, more pronounced on R than L side  FUNCTIONAL TESTS:  5 times sit to stand: 44.2 sec with use UE  Timed up and go (TUG): 33 second with 4WW  10 meter walk test: 0.53 m/s with 4WW  Berg Balance Scale: deferred : deferred  PATIENT SURVEYS:  SIS-16: 54                                                                                                                              TREATMENT DATE:  TE Nustep level 1 x 3 min level 3 x 3 min with B UE and LE reciprocal movement   LAQ 2 x 10 ea LE with 3# AW   TA STS- on airex mat and with ball in hands to assist with ant weight shift  3 x 5, high difficulty, still not full weight shift to feet ( toes off ground on some reps) but improved efficacy with task of standing with reps.    Gait training  in // bars - 3# AW Donned throughout  X 5 laps sidestepping working on lateral wiehgt shift and muscle activation  X 4 laps stepping over 1/2 foam rollers for foot clearance practice  X 6 laps forward and retro walk- focus on heel strike and foot clearance  X 120 ft gait from PT gym to SLP appointment with rollator and 3# AW, cues for foot clearance   Unless otherwise stated, CGA was provided and gait belt donned in order to ensure pt safety   PATIENT EDUCATION: Education details:  exam findings, prognosis, goals, plan Person educated: Patient Education method: Explanation Education comprehension: verbalized understanding  HOME EXERCISE PROGRAM: Access Code: [email or web address] URL: [email or web address] Date: 12/11/2023 Prepared by: Marlynn Singer  Exercises - Seated March  - 1 x daily - 7 x weekly - 2 sets - 10 reps - Seated Long Arc Quad  - 1 x daily - 7 x weekly - 2 sets - 10 reps - 3 sec hold - Standing Knee Flexion AROM with Chair Support  - 1 x daily - 7 x weekly - 2 sets - 10 reps - Wide tandem with counter support as needed   - 1 x daily - 7 x weekly - 3 sets - 30 sec  hold  GOALS: Goals reviewed with patient? Yes  SHORT TERM GOALS: Target date: 01/18/2024   Patient will be independent in home exercise program to improve strength/mobility for better functional independence with ADLs. Baseline: Goal status: INITIAL   LONG TERM GOALS: Target date:02/29/2024    Patient will increase SIS-16 score by at least 10 points  to demonstrate increased ease with ADLs and quality of life.  Baseline: 54 Goal status: INITIAL  2.  Patient (>  75 years old) will complete five times sit to stand test in < 15 seconds indicating an increased LE strength and improved balance. Baseline: 44.2 sec with use of BUE Goal status: INITIAL  3.  Patient will increase Berg Balance score by > 6 points to demonstrate decreased fall risk during functional activities Baseline: 34 Goal status: INITIAL  4.  Patient will increase 10 meter walk test to >1.62m/s as to improve gait speed for better community ambulation and to reduce fall risk. Baseline: 0.53 m/s with 4WW Goal status: INITIAL  5.  Patient will reduce timed up and go to <11 seconds to reduce fall risk and demonstrate improved transfer/gait ability. Baseline: 33 sec with 4WW Goal status: INITIAL  6. Patient will increase six minute walk test distance to >1000 for progression to community ambulator and improve  gait ability  Baseline: 310 feet using a 4WW with CGA and only completes 3:17 sec prior to requesting to sit secondary to fatigue Goal status: INITIAL   ASSESSMENT:  CLINICAL IMPRESSION:  Patient arrived with good motivation for completion of pt activities.  Pt challenged with gait, strength and functional interventions. Pt challenged with ant weight shift for STS but showed improvement with practice.  Pt will continue to benefit from skilled physical therapy intervention to address impairments, improve QOL, and attain therapy goals.    OBJECTIVE IMPAIRMENTS: Abnormal gait, decreased activity tolerance, decreased balance, decreased endurance, decreased mobility, difficulty walking, decreased strength, impaired UE functional use, improper body mechanics, postural dysfunction, and pain.   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   REHAB POTENTIAL: Good  CLINICAL DECISION MAKING: Evolving/moderate complexity  EVALUATION COMPLEXITY: Moderate  PLAN:  PT FREQUENCY: 1-2x/week  PT DURATION: 12 weeks  PLANNED INTERVENTIONS: 97164- PT Re-evaluation, 97750- Physical Performance Testing, 97110-Therapeutic exercises, 97530- Therapeutic activity, [id number]- Neuromuscular re-education, 97535- Self Care, [id number]- Manual therapy, [id number]- Gait training, [phone]- Orthotic Initial, [phone]- Orthotic/Prosthetic subsequent, [phone]- Canalith repositioning, Patient/Family education, Balance training, Stair training, Taping, Joint mobilization, Spinal mobilization, Vestibular  training, DME instructions, Wheelchair mobility training, Cryotherapy, and Moist heat  PLAN FOR NEXT SESSION: gait, balance, progress HEP as indicated.    Edwina Gram, PT 12/13/2023, 8:17 AM

## 2023-12-18 ENCOUNTER — Encounter: Admitting: Physical Medicine and Rehabilitation

## 2023-12-19 ENCOUNTER — Ambulatory Visit

## 2023-12-19 ENCOUNTER — Ambulatory Visit: Admitting: Speech Pathology

## 2023-12-19 DIAGNOSIS — M6281 Muscle weakness (generalized): Secondary | ICD-10-CM

## 2023-12-19 DIAGNOSIS — R2681 Unsteadiness on feet: Secondary | ICD-10-CM

## 2023-12-19 DIAGNOSIS — R471 Dysarthria and anarthria: Secondary | ICD-10-CM

## 2023-12-19 DIAGNOSIS — R262 Difficulty in walking, not elsewhere classified: Secondary | ICD-10-CM

## 2023-12-19 NOTE — Therapy (Signed)
 OUTPATIENT PHYSICAL THERAPY NEURO TREATMENT   Patient Name: [name] [name] MRN: [id number] DOB:09-28-1940, 83 y.o., male Today's Date: 12/19/2023   PCP: Al Hover, MD REFERRING PROVIDER:   Sterling Eisenmenger, PA-C    END OF SESSION:  PT End of Session - 12/19/23 0833     Visit Number 4    Number of Visits 25    Date for PT Re-Evaluation 02/29/24    Progress Note Due on Visit 10    PT Start Time 0808    PT Stop Time 0844    PT Time Calculation (min) 36 min    Equipment Utilized During Treatment Gait belt    Activity Tolerance Patient tolerated treatment well    Behavior During Therapy Flat affect               Past Medical History:  Diagnosis Date   Arthritis    Depression    Diabetes mellitus without complication (HCC)    Gout    Hyperlipidemia    Hypertension    Ischemic cardiomyopathy    History reviewed. No pertinent surgical history. 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   Ischemic cardiomyopathy 06/12/2018   Thrombocytosis 04/16/2016   Adjustment reaction with prolonged depressive reaction 01/22/2014   Visual loss, right eye 07/30/2012   Type 2 diabetes mellitus (HCC) 06/16/2011   Coronary artery disease 06/16/2011   Hypertension associated with diabetes (HCC) 06/16/2011    ONSET DATE: 11/16/2023  REFERRING DIAG: W09.811 (ICD-10-CM) - Acute ischemic left MCA stroke (HCC)   THERAPY DIAG:  Muscle weakness (generalized)  Unsteadiness on feet  Difficulty in walking, not elsewhere classified  Rationale for Evaluation and Treatment: Rehabilitation  SUBJECTIVE:                                                                                                                                                                                             SUBJECTIVE STATEMENT:  Pt reports he  doesn't feel good today. He feels congested. He says it started over night. Pt reports his energy level is not great. He does not feel up for doing much today in PT. Pt states he feels "out of it."  from eval: Pt is a pleasant 83 y/o male referred to PT s/p L MCA stroke. Pt also seeing speech as well. Pt with very weak voice, at times hx difficult to take as a result. He attempted to ambulate to clinic with 4WW but became too fatigued  and required assistance into WC. Pt now presents to PT eval in WC. He reports he primarily uses a 4WW at home, but does not ambulate much during the day (maybe ten minutes total). He reports difficulty with standing up from chairs. He must use grab bars in his bathroom due to difficulty standing up from toilet. He has difficulty with stairs, requires help getting up step to enter his home. He reports no recent falls, but that he does stumble with his 4WW when fatigued.  Pt accompanied by: self  PERTINENT HISTORY:  Via ED d/c note: "Presented 11/16/2023 to [facility] with right facial droop left gaze preference and dysarthria. CT/MRI showed small acute nonhemorrhagic left MCA distribution infarction involving the left frontal lobe. No associated mass effect. Patient did not receive TNK. CTA showed no large vessel occlusion or evidence of finding amenable to neurovascular intervention... 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 No none currently but does report arthritis pain in L shoulder limiting ability to lift/raise arm  PRECAUTIONS: Fall  RED FLAGS: None   WEIGHT BEARING RESTRICTIONS: No  FALLS: Has patient fallen in last 6 months? No but does report stumbling with fatigue  LIVING ENVIRONMENT: Lives with: lives with their family son and DIL Lives in: House/apartment, two level home but not using upstairs,  stays on first floor Stairs: 1 step to enter home, says no handrails but he has help getting up step Has following equipment at home: Walker - 4 wheeled, shower chair, and Grab bars  PLOF: Independent  PATIENT GOALS: everything: walking, balance, strength   OBJECTIVE:  Note: Objective measures were completed at Evaluation unless otherwise noted.  DIAGNOSTIC FINDINGS:  imaging via chart  MR brain 11/16/23: " IMPRESSION: 1. Small acute nonhemorrhagic left MCA distribution infarct involving the left frontal lobe. No associated mass effect. 2. Additional punctate subcentimeter acute ischemic nonhemorrhagic infarct within the contralateral right basal ganglia. 3. Underlying moderately advanced chronic microvascular ischemic disease with a few scattered remote lacunar infarcts as above. 4. Chronic occlusion of the left vertebral artery.     Electronically Signed   By: Virgia Griffins M.D.   On: 11/16/2023 23:48"  CT ANGIO HEAD NECK 11/16/23 " IMPRESSION: No large vessel occlusion or evidence of findings amenable to neurovascular intervention.   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   Multiple intracranial vascular stenoses as above. Focal short segment occlusion of an M2 inferior division branch of the right MCA with reconstitution noted.   Mild-to-moderate stenosis of the V4 segment right vertebral artery.   Emphysema (ICD10-J43.9).     Electronically Signed   By: Denny Flack M.D.   On: 11/16/2023 15:50"  CT HEAD 11/16/23: "IMPRESSION: 1. 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 2. Subtle hypoattenuation at the anterior limb of the right internal capsule. 3. 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 4. Age  indeterminate infarct in the left lentiform nucleus. 5. Moderate atrophy and white matter disease likely reflects the sequela of chronic microvascular ischemia. 6. No acute hemorrhage or mass lesion.   These results were called by telephone at the time of interpretation on 11/16/2023 at  3:13 pm to provider Dr. Doretta Gant, who verbally acknowledged these results.     Electronically Signed   By: Audree Leas M.D.   On: 11/16/2023 15:14"  COGNITION: Overall cognitive status: Within functional limits for tasks assessed but at times difficult to fully determine due to weakness of voice and some difficulty responding to PT questions as a result   SENSATION: WFL to light touch bilat UE and bilat LE  COORDINATION: WFL rapid alt movement UE and finger chin<>target bilat  WFL rapid alt movement LE and heel>shin bilat  EDEMA:  Pt reports no swelling     POSTURE: slight rounded shoulders, forward head, and increased thoracic kyphosis   LOWER EXTREMITY MMT:    Gross bilat LE strength is 4/5, more weakness found proximal than distal   BED MOBILITY:  Not assessed  TRANSFERS: Initially min a, then pt able to complete CGA   STAIRS: Not assessed but impaired per pt report GAIT: Findings: decreased gait speed (See ), FWD flexed posture with gait, decreased bilat heel strike, more pronounced on R than L side  FUNCTIONAL TESTS:  5 times sit to stand: 44.2 sec with use UE  Timed up and go (TUG): 33 second with 4WW  10 meter walk test: 0.53 m/s with 4WW  Berg Balance Scale: deferred : deferred  PATIENT SURVEYS:  SIS-16: 54                                                                                                                              TREATMENT DATE:  TA: Seated BP: 160/80 mmHg HR 66  SpO2% 98%, HR 66   TE: LAQ 2 x 10 ea LE with 2# AW Seated march with 2# 1x10, 1x5  BTB hamstring curl (seated) 2x10 each LE BTB hip abduction 2x10-12 Seated hip  adduction with pball 2x10  TA STS 5x - difficult today - use of BUE  Gait with 2# aw and 4WW for endurance x 74 ft. Pt too fatigued to complete second set.  BP reassessed in seated: 156/75 mmHg HR 67 bpm     PATIENT EDUCATION: Education details: Pt educated throughout session about proper posture and technique with exercises. Improved exercise technique, movement at target joints, use of target muscles after min to mod verbal, visual, tactile cues.  Person educated: Patient Education method: Explanation, Demonstration, and Verbal cues Education comprehension: verbalized understanding and returned demonstration  HOME EXERCISE PROGRAM: Access Code: [email or web address] URL: [email or web address] Date: 12/11/2023 Prepared by: Marlynn Singer  Exercises - Seated March  - 1 x daily - 7 x weekly - 2 sets - 10 reps - Seated Long Arc Quad  - 1 x daily - 7 x weekly - 2 sets - 10 reps - 3 sec hold - Standing Knee Flexion AROM with Chair Support  - 1 x daily - 7 x weekly - 2 sets - 10 reps - Wide tandem with counter support as needed   - 1 x  daily - 7 x weekly - 3 sets - 30 sec  hold  GOALS: Goals reviewed with patient? Yes  SHORT TERM GOALS: Target date: 01/18/2024   Patient will be independent in home exercise program to improve strength/mobility for better functional independence with ADLs. Baseline: Goal status: INITIAL   LONG TERM GOALS: Target date:02/29/2024    Patient will increase SIS-16 score by at least 10 points  to demonstrate increased ease with ADLs and quality of life.  Baseline: 54 Goal status: INITIAL  2.  Patient (> 45 years old) will complete five times sit to stand test in < 15 seconds indicating an increased LE strength and improved balance. Baseline: 44.2 sec with use of BUE Goal status: INITIAL  3.  Patient will increase Berg Balance score by > 6 points to demonstrate decreased fall risk during functional activities Baseline: 34 Goal status:  INITIAL  4.  Patient will increase 10 meter walk test to >1.64m/s as to improve gait speed for better community ambulation and to reduce fall risk. Baseline: 0.53 m/s with 4WW Goal status: INITIAL  5.  Patient will reduce timed up and go to <11 seconds to reduce fall risk and demonstrate improved transfer/gait ability. Baseline: 33 sec with 4WW Goal status: INITIAL  6. Patient will increase six minute walk test distance to >1000 for progression to community ambulator and improve gait ability  Baseline: 310 feet using a 4WW with CGA and only completes 3:17 sec prior to requesting to sit secondary to fatigue Goal status: INITIAL   ASSESSMENT:  CLINICAL IMPRESSION: Session somewhat limited today secondary to pt feeling unwell and increased fatigue. Pt vitals assessed prior to and following interventions (see above). Interventions regressed and modified to limit further increase in fatigue. Pt without pain or other symptoms during visit. Pt will continue to benefit from skilled physical therapy intervention to address impairments, improve QOL, and attain therapy goals.    OBJECTIVE IMPAIRMENTS: Abnormal gait, decreased activity tolerance, decreased balance, decreased endurance, decreased mobility, difficulty walking, decreased strength, impaired UE functional use, improper body mechanics, postural dysfunction, and pain.   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   REHAB POTENTIAL: Good  CLINICAL DECISION MAKING: Evolving/moderate  complexity  EVALUATION COMPLEXITY: Moderate  PLAN:  PT FREQUENCY: 1-2x/week  PT DURATION: 12 weeks  PLANNED INTERVENTIONS: 97164- PT Re-evaluation, 97750- Physical Performance Testing, 97110-Therapeutic exercises, 97530- Therapeutic activity, [id number]- Neuromuscular re-education, 97535- Self Care, 16109- Manual therapy, [id number]- Gait training, [phone]- Orthotic Initial, [phone]- Orthotic/Prosthetic subsequent, [phone]- Canalith repositioning, Patient/Family education, Balance training, Stair training, Taping, Joint mobilization, Spinal mobilization, Vestibular training, DME instructions, Wheelchair mobility training, Cryotherapy, and Moist heat  PLAN FOR NEXT SESSION: gait, balance, progress HEP as indicated.    Samie Crews, PT 12/19/2023, 11:28 AM

## 2023-12-19 NOTE — Therapy (Signed)
 OUTPATIENT SPEECH LANGUAGE PATHOLOGY  Treatment Note   Patient Name: [name] [name] MRN: [id number] DOB:1941-01-31, 83 y.o., male Today's Date: 12/19/2023  PCP: Jerrlyn Morel, MD REFERRING PROVIDER: Georjean Kite, PA-C   End of Session - 12/19/23 [phone]     Visit Number 4    Number of Visits 25    Date for SLP Re-Evaluation 02/29/24    Authorization Type [employer] HMO/PPO    Progress Note Due on Visit 10    SLP Start Time 0840    SLP Stop Time  0905    SLP Time Calculation (min) 25 min    Activity Tolerance Patient tolerated treatment well;Other (comment)   Pt wasn't feeling well this morning but he put forth good effort            No past medical history on file.  The histories are not reviewed yet. Please review them in the "History" navigator section and refresh this SmartLink. 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   Ischemic cardiomyopathy 06/12/2018   Thrombocytosis 04/16/2016   Adjustment reaction with prolonged depressive reaction 01/22/2014   Visual loss, right eye 07/30/2012   Type 2 diabetes mellitus (HCC) 06/16/2011   Coronary artery disease 06/16/2011   Hypertension associated with diabetes (HCC) 06/16/2011    ONSET DATE: date of CVA 11/16/2023; date referral  12/05/2023  REFERRING DIAG:  V42.595 (ICD-10-CM) - Acute ischemic left MCA stroke (HCC)      THERAPY DIAG:  Dysarthria and anarthria  Rationale for Evaluation and Treatment Habilitation  SUBJECTIVE:   PERTINENT HISTORY and DIAGNOSTIC FINDINGS: Pt is a 83 y.o. male who presented to [facility] ED on 11/16/2023 with stroke symptoms. 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 Additional punctate subcentimeter acute ischemic nonhemorrhagic infarct was also present within the contralateral right  basal ganglia. He has a PMH of DM2, HTN, HLD, obstructive CAD s/p BMS to mid LAD (2012), ischemic cardiomyopathy, chronic thrombocytosis, frequent PVCs Pt admitted to CIR on 11/21/2023 thru 12/05/2023.  CT scan revealed - Upper chest: Centrilobular emphysema in the visualized upper lobes with additional paraseptal emphysema in the lung apices most pronounced on the right.    PAIN:  Are you having pain? No   FALLS: Has patient fallen in last 6 months?  See PT evaluation for details  LIVING ENVIRONMENT: Lives with: lives with their family: pt lived independently prior to CVA with his brother, currently lives with his son [name]) and his daughter-in-law [name]) post stroke, she daughter [name]) stays with him during the day while his son and DIL work Lives in: House/apartment  PLOF:  Level of assistance: Independent with ADLs, Independent with IADLs; pt reports finishing the 10th grade Employment: Retired   PATIENT GOALS      unable to state  SUBJECTIVE STATEMENT: Pt attended session following PT, "he isn't feeling well today" Pt accompanied by: self  OBJECTIVE:   TODAY'S TREATMENT:  Skilled treatment session focused on pt's dysarthria goals. SLP facilitated session by providing the following interventions:  Pt arrives to ST session reporting that he didn't sleep well and doesn't feel good. Appeared fatigued, no stroke-like symptoms were identified.   As a result, pt's vocal intensity was reduced to that of a whisper with reduced speech intelligibility of ~ 50% at the simple sentence level. Pt with  attempts to improve but largely unsuccessful in today's session.   Pt attempted EMST set at 15cm H2O with increased effort appreciated to complete 2 sets of 8.   PATIENT EDUCATION: Education details: See above Person educated: Patient Education method: Explanation Education comprehension: needs further education   HOME EXERCISE PROGRAM: Practice EMST - pt has device Using  "before your stroke speech" cue when reading functional phrases  GOALS: Goals reviewed with patient? Yes  SHORT TERM GOALS: Target date: 10 sessions  Pt will complete EMST w/ modA to facilitate increased breath support during speech production  Baseline: Goal status: INITIAL  2.  With maximal A, pt will recall compensatory speech strategies in 3/4 opportunities. Baseline:  Goal status: INITIAL  3.  With maximal A, pt will utilize compensatory speech strategies at phrase level to maintain 50% intelligibility. Baseline:  Goal status: INITIAL   LONG TERM GOALS: Target date: 02/29/2024  Pt will complete EMST with Min A to facilitate increased breath support for speech production.  Baseline:  Goal status: INITIAL  2.  With Moderate A, pt will utilize speech intelligibility strategies to achieve 75% speech intelligibility at the phrase level in a quiet environment to when expressing basic wants/needs.  Baseline:  Goal status: INITIAL   ASSESSMENT:  CLINICAL IMPRESSION: Patient is a 83 y.o. male who was seen today for a speech language treatment. Pt's expressive and receptive language abilities appear functional when communication basic wants/needs. Will continue to assess these abilities as well as cognitive communication abilities as speech intelligibility increases. Pt's most impairing deficits is his profound dysarthria that is c/b decreased respiratory support for phonation results in difficulty coordinating respiration and phonation and reduced vocal intensity. His speech intelligibility is subsequently reduced to ~ 25% at the phrase level. Pt with reduced awareness and slow progress which reduces his prognosis as well as diagnose of pulmonary disease in chart "Upper chest: Centrilobular emphysema in the visualized upper lobes with additional paraseptal emphysema in the lung apices most pronounced on the right."   Pt with decreased vocal intensity today and decreased overall  ability to participate d/t not feeling well. He reports that he didn't sleep good and felt weak. No focal deficits identified. See the above treatment note for details.   OBJECTIVE IMPAIRMENTS include dysphagia. These impairments are limiting patient from effectively communicating at home and in community. Factors affecting potential to achieve goals and functional outcome are severity of impairments and potential pulmonary disease. Patient will benefit from skilled SLP services to address above impairments and improve overall function.  REHAB POTENTIAL: Fair slow progress, reduced awareness, pulmonary disease  PLAN: SLP FREQUENCY: 1-2x/week  SLP DURATION: 12 weeks  PLANNED INTERVENTIONS: SLP instruction and feedback, Compensatory strategies, and Patient/family education    Taleisha Kaczynski B. Garlin Junker, M.S., CCC-SLP, Tree surgeon Certified Brain Injury Specialist [facility]  [facility] Office [phone] Ascom [phone] Fax [phone]

## 2023-12-21 ENCOUNTER — Ambulatory Visit

## 2023-12-21 ENCOUNTER — Ambulatory Visit: Admitting: Speech Pathology

## 2023-12-21 DIAGNOSIS — R471 Dysarthria and anarthria: Secondary | ICD-10-CM | POA: Diagnosis not present

## 2023-12-21 DIAGNOSIS — R2681 Unsteadiness on feet: Secondary | ICD-10-CM

## 2023-12-21 DIAGNOSIS — M6281 Muscle weakness (generalized): Secondary | ICD-10-CM

## 2023-12-21 DIAGNOSIS — R262 Difficulty in walking, not elsewhere classified: Secondary | ICD-10-CM

## 2023-12-21 NOTE — Therapy (Signed)
 OUTPATIENT PHYSICAL THERAPY NEURO TREATMENT   Patient Name: JAMESON ROWLETTE MRN: [id number] DOB:11-26-1940, 83 y.o., male Today's Date: 12/21/2023   PCP: Al Hover, MD REFERRING PROVIDER:   Sterling Eisenmenger, PA-C    END OF SESSION:      Past Medical History:  Diagnosis Date   Arthritis    Depression    Diabetes mellitus without complication (HCC)    Gout    Hyperlipidemia    Hypertension    Ischemic cardiomyopathy    No past surgical history on file. 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   Ischemic cardiomyopathy 06/12/2018   Thrombocytosis 04/16/2016   Adjustment reaction with prolonged depressive reaction 01/22/2014   Visual loss, right eye 07/30/2012   Type 2 diabetes mellitus (HCC) 06/16/2011   Coronary artery disease 06/16/2011   Hypertension associated with diabetes (HCC) 06/16/2011    ONSET DATE: 11/16/2023  REFERRING DIAG: U13.244 (ICD-10-CM) - Acute ischemic left MCA stroke (HCC)   THERAPY DIAG:  No diagnosis found.  Rationale for Evaluation and Treatment: Rehabilitation  SUBJECTIVE:                                                                                                                                                                                             SUBJECTIVE STATEMENT:   Pt reports he feels pretty good today.  Pt unsure of if the congestion is better, but he seems to not have any symptoms currently.  from eval: Pt is a pleasant 83 y/o male referred to PT s/p L MCA stroke. Pt also seeing speech as well. Pt with very weak voice, at times hx difficult to take as a result. He attempted to ambulate to clinic with 4WW but became too fatigued and required assistance into WC. Pt now presents to PT eval in WC. He reports he primarily uses a 4WW at home, but does not ambulate  much during the day (maybe ten minutes total). He reports difficulty with standing up from chairs. He must use grab bars in his bathroom due to difficulty standing up from toilet. He has difficulty with stairs, requires help getting up step to enter his home. He reports no recent falls, but that he does stumble with his 4WW when fatigued.  Pt accompanied by: self  PERTINENT HISTORY:  Via ED d/c note: "Presented 11/16/2023 to [facility] with right facial droop left gaze preference and dysarthria. CT/MRI showed small acute nonhemorrhagic left MCA distribution infarction involving the left  frontal lobe. No associated mass effect. Patient did not receive TNK. CTA showed no large vessel occlusion or evidence of finding amenable to neurovascular intervention... 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 No none currently but does report arthritis pain in L shoulder limiting ability to lift/raise arm  PRECAUTIONS: Fall  RED FLAGS: None   WEIGHT BEARING RESTRICTIONS: No  FALLS: Has patient fallen in last 6 months? No but does report stumbling with fatigue  LIVING ENVIRONMENT: Lives with: lives with their family son and DIL Lives in: House/apartment, two level home but not using upstairs, stays on first floor Stairs: 1 step to enter home, says no handrails but he has help getting up step Has following equipment at home: Walker - 4 wheeled, shower chair, and Grab bars  PLOF: Independent  PATIENT GOALS: everything: walking, balance, strength   OBJECTIVE:  Note: Objective measures were completed at Evaluation unless otherwise noted.  DIAGNOSTIC FINDINGS:  imaging via chart  MR brain 11/16/23: " IMPRESSION: 1. Small acute nonhemorrhagic left MCA distribution infarct involving the left frontal lobe. No associated mass effect. 2. Additional  punctate subcentimeter acute ischemic nonhemorrhagic infarct within the contralateral right basal ganglia. 3. Underlying moderately advanced chronic microvascular ischemic disease with a few scattered remote lacunar infarcts as above. 4. Chronic occlusion of the left vertebral artery.     Electronically Signed   By: Virgia Griffins M.D.   On: 11/16/2023 23:48"  CT ANGIO HEAD NECK 11/16/23 " IMPRESSION: No large vessel occlusion or evidence of findings amenable to neurovascular intervention.   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   Multiple intracranial vascular stenoses as above. Focal short segment occlusion of an M2 inferior division branch of the right MCA with reconstitution noted.   Mild-to-moderate stenosis of the V4 segment right vertebral artery.   Emphysema (ICD10-J43.9).     Electronically Signed   By: Denny Flack M.D.   On: 11/16/2023 15:50"  CT HEAD 11/16/23: "IMPRESSION: 1. 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 2. Subtle hypoattenuation at the anterior limb of the right internal capsule. 3. 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 4. Age indeterminate infarct in the left lentiform nucleus. 5. Moderate atrophy and white matter disease likely reflects the sequela of chronic microvascular ischemia. 6. No acute hemorrhage or mass lesion.   These results were called by telephone at the time of interpretation on 11/16/2023 at 3:13 pm to provider Dr. Doretta Gant, who verbally acknowledged these results.     Electronically Signed   By: Audree Leas M.D.   On: 11/16/2023 15:14"  COGNITION: Overall cognitive status: Within functional limits for tasks assessed but at times difficult to fully determine  due to weakness of voice and some difficulty responding to PT questions as a result   SENSATION: WFL to light touch bilat UE and bilat LE  COORDINATION: WFL rapid alt movement UE and finger chin<>target bilat  WFL rapid alt movement LE and heel>shin bilat  EDEMA:  Pt reports no swelling     POSTURE: slight rounded shoulders, forward head, and increased thoracic kyphosis   LOWER EXTREMITY MMT:    Gross  bilat LE strength is 4/5, more weakness found proximal than distal   BED MOBILITY:  Not assessed  TRANSFERS: Initially min a, then pt able to complete CGA   STAIRS: Not assessed but impaired per pt report GAIT: Findings: decreased gait speed (See ), FWD flexed posture with gait, decreased bilat heel strike, more pronounced on R than L side  FUNCTIONAL TESTS:  5 times sit to stand: 44.2 sec with use UE  Timed up and go (TUG): 33 second with 4WW  10 meter walk test: 0.53 m/s with 4WW  Berg Balance Scale: deferred : deferred  PATIENT SURVEYS:  SIS-16: 54                                                                                                                              TREATMENT DATE:   TherAct:  Seated BP at beginning of session: 155/95 mmHg HR 67 SpO2% 98%  STS while holding onto physioball for forward momentum, x10 Gait with 3# AW and 4WW for endurance x160'  Seated BP at beginning of session: 174/82 mmHg HR 70 SpO2% 98%   TherEx:  LAQ 2 x 10 ea LE with 3# AW Seated march with 3#, sitting away from chair, no UE support on arm rests, 2x10 Seated BTB hamstring curl  2x10 each LE BTB hip abduction 2x Seated hip adduction with pball 2x10   PATIENT EDUCATION: Education details: Pt educated throughout session about proper posture and technique with exercises. Improved exercise technique, movement at target joints, use of target muscles after min to mod verbal, visual, tactile cues.  Person educated: Patient Education method:  Explanation, Demonstration, and Verbal cues Education comprehension: verbalized understanding and returned demonstration  HOME EXERCISE PROGRAM: Access Code: [email or web address] URL: [email or web address] Date: 12/11/2023 Prepared by: Marlynn Singer  Exercises - Seated March  - 1 x daily - 7 x weekly - 2 sets - 10 reps - Seated Long Arc Quad  - 1 x daily - 7 x weekly - 2 sets - 10 reps - 3 sec hold - Standing Knee Flexion AROM with Chair Support  - 1 x daily - 7 x weekly - 2 sets - 10 reps - Wide tandem with counter support as needed   - 1 x daily - 7 x weekly - 3 sets - 30 sec  hold  GOALS: Goals reviewed with patient? Yes  SHORT TERM GOALS: Target date: 01/18/2024  Patient will be independent in home exercise program to improve strength/mobility for better functional independence with ADLs. Baseline: Goal status: INITIAL   LONG TERM GOALS: Target date:02/29/2024  Patient will increase SIS-16 score by at least 10 points  to demonstrate increased ease with ADLs and quality of life.  Baseline: 54 Goal status: INITIAL  2.  Patient (> 36 years old) will complete five times sit to stand test in < 15 seconds indicating an increased LE strength and improved balance. Baseline: 44.2 sec with use of BUE Goal status:  INITIAL  3.  Patient will increase Berg Balance score by > 6 points to demonstrate decreased fall risk during functional activities Baseline: 34 Goal status: INITIAL  4.  Patient will increase 10 meter walk test to >1.68m/s as to improve gait speed for better community ambulation and to reduce fall risk. Baseline: 0.53 m/s with 4WW Goal status: INITIAL  5.  Patient will reduce timed up and go to <11 seconds to reduce fall risk and demonstrate improved transfer/gait ability. Baseline: 33 sec with 4WW Goal status: INITIAL  6. Patient will increase six minute walk test distance to >1000 for progression to community ambulator and improve gait ability  Baseline: 310  feet using a 4WW with CGA and only completes 3:17 sec prior to requesting to sit secondary to fatigue Goal status: INITIAL   ASSESSMENT:  CLINICAL IMPRESSION:  Pt responded well to the exercises and put forth great effort throughout the session.  Pt able to participate with increased resistance without complication.  Pt with increased endurance as well.   Pt will continue to benefit from skilled therapy to address remaining deficits in order to improve overall QoL and return to PLOF.        OBJECTIVE IMPAIRMENTS: Abnormal gait, decreased activity tolerance, decreased balance, decreased endurance, decreased mobility, difficulty walking, decreased strength, impaired UE functional use, improper body mechanics, postural dysfunction, and pain.   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   REHAB POTENTIAL: Good  CLINICAL DECISION MAKING: Evolving/moderate complexity  EVALUATION COMPLEXITY: Moderate  PLAN:  PT FREQUENCY: 1-2x/week  PT DURATION: 12 weeks  PLANNED INTERVENTIONS: 97164- PT Re-evaluation, 97750- Physical Performance Testing, 97110-Therapeutic exercises, 97530- Therapeutic activity, [id number]- Neuromuscular re-education, 97535- Self Care, 95284- Manual therapy, [id number]- Gait training, [phone]- Orthotic Initial, [phone]- Orthotic/Prosthetic subsequent, [phone]- Canalith repositioning, Patient/Family education, Balance training, Stair training, Taping, Joint mobilization, Spinal mobilization, Vestibular training, DME instructions, Wheelchair mobility  training, Cryotherapy, and Moist heat  PLAN FOR NEXT SESSION:  gait, balance, progress HEP as indicated.    Rozanna Corner, PT, DPT Physical Therapist - [facility]  12/21/23, 9:19 AM

## 2023-12-21 NOTE — Therapy (Signed)
 OUTPATIENT SPEECH LANGUAGE PATHOLOGY  Treatment Note   Patient Name: [name] [name] MRN: [id number] DOB:1940-10-07, 83 y.o., male Today's Date: 12/21/2023  PCP: Jerrlyn Morel, MD REFERRING PROVIDER: Georjean Kite, PA-C   End of Session - 12/21/23 [phone]     Visit Number 5             No past medical history on file.  The histories are not reviewed yet. Please review them in the "History" navigator section and refresh this SmartLink. 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   Ischemic cardiomyopathy 06/12/2018   Thrombocytosis 04/16/2016   Adjustment reaction with prolonged depressive reaction 01/22/2014   Visual loss, right eye 07/30/2012   Type 2 diabetes mellitus (HCC) 06/16/2011   Coronary artery disease 06/16/2011   Hypertension associated with diabetes (HCC) 06/16/2011    ONSET DATE: date of CVA 11/16/2023; date referral  12/05/2023  REFERRING DIAG:  M57.846 (ICD-10-CM) - Acute ischemic left MCA stroke (HCC)      THERAPY DIAG:  Dysarthria and anarthria  Rationale for Evaluation and Treatment Habilitation  SUBJECTIVE:   PERTINENT HISTORY and DIAGNOSTIC FINDINGS: Pt is a 83 y.o. male who presented to [facility] ED on 11/16/2023 with stroke symptoms. 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 Additional punctate subcentimeter acute ischemic nonhemorrhagic infarct was also present within the contralateral right basal ganglia. He has a PMH of DM2, HTN, HLD, obstructive CAD s/p BMS to mid LAD (2012), ischemic cardiomyopathy, chronic thrombocytosis, frequent PVCs Pt admitted to CIR on 11/21/2023 thru 12/05/2023.  CT scan revealed - Upper chest: Centrilobular emphysema in the visualized upper lobes with additional paraseptal emphysema in the lung apices most pronounced on  the right.    PAIN:  Are you having pain? No   FALLS: Has patient fallen in last 6 months?  See PT evaluation for details  LIVING ENVIRONMENT: Lives with: lives with their family: pt lived independently prior to CVA with his brother, currently lives with his son [name]) and his daughter-in-law [name]) post stroke, she daughter [name]) stays with him during the day while his son and DIL work Lives in: House/apartment  PLOF:  Level of assistance: Independent with ADLs, Independent with IADLs; pt reports finishing the 10th grade Employment: Retired   PATIENT GOALS      unable to state  SUBJECTIVE STATEMENT: Pt reports feeling better today, laughing more and more interaction Pt accompanied by: self  OBJECTIVE:   TODAY'S TREATMENT:  Skilled treatment session focused on pt's dysarthria goals. SLP facilitated session by providing the following interventions:  Pt benefits from cue "using your before the stroke speech......" say the DOW, MOY, functional phrases and then pt asked to list 5 times within basic categories - with intermittent moderate verbal reminders pt able to use loud voice in 50% of opportunities  Pt attempted EMST set at 15cm H2O with increased effort appreciated to complete 3 sets of 10.   PATIENT EDUCATION: Education details: See above Person educated: Patient Education method: Explanation Education comprehension: needs further education   HOME EXERCISE PROGRAM: Practice EMST - pt has device Using "before your stroke speech" cue when reading functional phrases  GOALS: Goals reviewed with patient? Yes  SHORT TERM GOALS: Target date: 10 sessions  Pt will complete EMST w/ modA to facilitate increased breath support during speech  production  Baseline: Goal status: INITIAL  2.  With maximal A, pt will recall compensatory speech strategies in 3/4 opportunities. Baseline:  Goal status: INITIAL  3.  With maximal A, pt will utilize compensatory speech  strategies at phrase level to maintain 50% intelligibility. Baseline:  Goal status: INITIAL   LONG TERM GOALS: Target date: 02/29/2024  Pt will complete EMST with Min A to facilitate increased breath support for speech production.  Baseline:  Goal status: INITIAL  2.  With Moderate A, pt will utilize speech intelligibility strategies to achieve 75% speech intelligibility at the phrase level in a quiet environment to when expressing basic wants/needs.  Baseline:  Goal status: INITIAL   ASSESSMENT:  CLINICAL IMPRESSION: Patient is a 83 y.o. male who was seen today for a speech language treatment. Pt's expressive and receptive language abilities appear functional when communication basic wants/needs. Will continue to assess these abilities as well as cognitive communication abilities as speech intelligibility increases. Pt's most impairing deficits is his profound dysarthria that is c/b decreased respiratory support for phonation results in difficulty coordinating respiration and phonation and reduced vocal intensity. His speech intelligibility is subsequently reduced to ~ 25% at the phrase level. Pt with reduced awareness and slow progress which reduces his prognosis as well as diagnose of pulmonary disease in chart "Upper chest: Centrilobular emphysema in the visualized upper lobes with additional paraseptal emphysema in the lung apices most pronounced on the right."   Pt continues with good effort towards all therapy tasks but continues to benefit from moderate cues. See the above treatment note for details.   OBJECTIVE IMPAIRMENTS include dysphagia. These impairments are limiting patient from effectively communicating at home and in community. Factors affecting potential to achieve goals and functional outcome are severity of impairments and potential pulmonary disease. Patient will benefit from skilled SLP services to address above impairments and improve overall function.  REHAB  POTENTIAL: Fair slow progress, reduced awareness, pulmonary disease  PLAN: SLP FREQUENCY: 1-2x/week  SLP DURATION: 12 weeks  PLANNED INTERVENTIONS: SLP instruction and feedback, Compensatory strategies, and Patient/family education    Yaremi Stahlman B. Garlin Junker, M.S., CCC-SLP, Tree surgeon Certified Brain Injury Specialist [facility]  [facility] Rehabilitation Services Office [phone] Ascom [phone] Fax [phone]

## 2023-12-26 ENCOUNTER — Ambulatory Visit

## 2023-12-26 ENCOUNTER — Ambulatory Visit: Admitting: Speech Pathology

## 2023-12-26 DIAGNOSIS — R2681 Unsteadiness on feet: Secondary | ICD-10-CM

## 2023-12-26 DIAGNOSIS — R262 Difficulty in walking, not elsewhere classified: Secondary | ICD-10-CM

## 2023-12-26 DIAGNOSIS — R471 Dysarthria and anarthria: Secondary | ICD-10-CM

## 2023-12-26 DIAGNOSIS — M6281 Muscle weakness (generalized): Secondary | ICD-10-CM

## 2023-12-26 NOTE — Therapy (Signed)
 Pt left after PT. Family didn't realize that pt had appt with ST services following PT.   Yon Schiffman B. Garlin Junker, M.S., CCC-SLP, Tree surgeon Certified Brain Injury Specialist [place]  [facility] Rehabilitation Services Office [phone] Ascom [phone] Fax [phone]

## 2023-12-26 NOTE — Therapy (Signed)
 OUTPATIENT PHYSICAL THERAPY NEURO TREATMENT   Patient Name: [name] [name] MRN: [id number] DOB:1940-08-17, 83 y.o., male Today's Date: 12/26/2023   PCP: Al Hover, MD REFERRING PROVIDER:   Sterling Eisenmenger, PA-C    END OF SESSION:  PT End of Session - 12/26/23 0803     Visit Number 6    Number of Visits 25    Date for PT Re-Evaluation 02/29/24    Progress Note Due on Visit 10    PT Start Time 0803    PT Stop Time 0845    PT Time Calculation (min) 42 min    Equipment Utilized During Treatment Gait belt    Activity Tolerance Patient tolerated treatment well    Behavior During Therapy Flat affect                Past Medical History:  Diagnosis Date   Arthritis    Depression    Diabetes mellitus without complication (HCC)    Gout    Hyperlipidemia    Hypertension    Ischemic cardiomyopathy    No past surgical history on file. 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   Ischemic cardiomyopathy 06/12/2018   Thrombocytosis 04/16/2016   Adjustment reaction with prolonged depressive reaction 01/22/2014   Visual loss, right eye 07/30/2012   Type 2 diabetes mellitus (HCC) 06/16/2011   Coronary artery disease 06/16/2011   Hypertension associated with diabetes (HCC) 06/16/2011    ONSET DATE: 11/16/2023  REFERRING DIAG: W09.811 (ICD-10-CM) - Acute ischemic left MCA stroke (HCC)   THERAPY DIAG:  Difficulty in walking, not elsewhere classified  Muscle weakness (generalized)  Unsteadiness on feet  Dysarthria and anarthria  Rationale for Evaluation and Treatment: Rehabilitation  SUBJECTIVE:                                                                                                                                                                                             SUBJECTIVE STATEMENT:    Pt reports no falls upon arrival to the clinic.  Pt reports having a good weekend, although he didn't do anything fun, says that he's "too old for fun".  from eval: Pt is a pleasant 83 y/o male referred to PT s/p L MCA stroke. Pt also seeing speech as well. Pt with very weak voice, at times hx difficult to take as a result. He attempted to ambulate to clinic with 4WW but became too fatigued and required assistance into WC. Pt now  presents to PT eval in WC. He reports he primarily uses a 4WW at home, but does not ambulate much during the day (maybe ten minutes total). He reports difficulty with standing up from chairs. He must use grab bars in his bathroom due to difficulty standing up from toilet. He has difficulty with stairs, requires help getting up step to enter his home. He reports no recent falls, but that he does stumble with his 4WW when fatigued.  Pt accompanied by: self  PERTINENT HISTORY:  Via ED d/c note: "Presented 11/16/2023 to [facility] with right facial droop left gaze preference and dysarthria. CT/MRI showed small acute nonhemorrhagic left MCA distribution infarction involving the left frontal lobe. No associated mass effect. Patient did not receive TNK. CTA showed no large vessel occlusion or evidence of finding amenable to neurovascular intervention... 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 No none currently but does report arthritis pain in L shoulder limiting ability to lift/raise arm  PRECAUTIONS: Fall  RED FLAGS: None   WEIGHT BEARING RESTRICTIONS: No  FALLS: Has patient fallen in last 6 months? No but does report stumbling with fatigue  LIVING ENVIRONMENT: Lives with: lives with their family son and DIL Lives in: House/apartment, two level home but not using upstairs, stays on first floor Stairs: 1 step to  enter home, says no handrails but he has help getting up step Has following equipment at home: Walker - 4 wheeled, shower chair, and Grab bars  PLOF: Independent  PATIENT GOALS: everything: walking, balance, strength   OBJECTIVE:  Note: Objective measures were completed at Evaluation unless otherwise noted.  DIAGNOSTIC FINDINGS:  imaging via chart  MR brain 11/16/23: " IMPRESSION: 1. Small acute nonhemorrhagic left MCA distribution infarct involving the left frontal lobe. No associated mass effect. 2. Additional punctate subcentimeter acute ischemic nonhemorrhagic infarct within the contralateral right basal ganglia. 3. Underlying moderately advanced chronic microvascular ischemic disease with a few scattered remote lacunar infarcts as above. 4. Chronic occlusion of the left vertebral artery.     Electronically Signed   By: Virgia Griffins M.D.   On: 11/16/2023 23:48"  CT ANGIO HEAD NECK 11/16/23 " IMPRESSION: No large vessel occlusion or evidence of findings amenable to neurovascular intervention.   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   Multiple intracranial vascular stenoses as above. Focal short segment occlusion of an M2 inferior division branch of the right MCA with reconstitution noted.   Mild-to-moderate stenosis of the V4 segment right vertebral artery.   Emphysema (ICD10-J43.9).     Electronically Signed   By: Denny Flack M.D.   On: 11/16/2023 15:50"  CT HEAD 11/16/23: "IMPRESSION: 1. 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 2. Subtle hypoattenuation at the anterior limb of the right internal capsule. 3. 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 4. Age indeterminate infarct in the left lentiform  nucleus. 5. Moderate atrophy and white matter disease likely reflects the sequela of chronic microvascular ischemia. 6. No acute hemorrhage or mass lesion.   These results were called by telephone at the time of interpretation on 11/16/2023 at 3:13 pm to provider Dr. Doretta Gant, who  verbally acknowledged these results.     Electronically Signed   By: Audree Leas M.D.   On: 11/16/2023 15:14"  COGNITION: Overall cognitive status: Within functional limits for tasks assessed but at times difficult to fully determine due to weakness of voice and some difficulty responding to PT questions as a result   SENSATION: WFL to light touch bilat UE and bilat LE  COORDINATION: WFL rapid alt movement UE and finger chin<>target bilat  WFL rapid alt movement LE and heel>shin bilat  EDEMA:  Pt reports no swelling     POSTURE: slight rounded shoulders, forward head, and increased thoracic kyphosis   LOWER EXTREMITY MMT:    Gross bilat LE strength is 4/5, more weakness found proximal than distal   BED MOBILITY:  Not assessed  TRANSFERS: Initially min a, then pt able to complete CGA   STAIRS: Not assessed but impaired per pt report GAIT: Findings: decreased gait speed (See ), FWD flexed posture with gait, decreased bilat heel strike, more pronounced on R than L side  FUNCTIONAL TESTS:  5 times sit to stand: 44.2 sec with use UE  Timed up and go (TUG): 33 second with 4WW  10 meter walk test: 0.53 m/s with 4WW  Berg Balance Scale: deferred : deferred  PATIENT SURVEYS:  SIS-16: 54                                                                                                                              TREATMENT DATE:   TherAct:  Seated BP at beginning of session: 167/83 mmHg  HR 76 SpO2% 98%  Seated weighted marches for increased hip flexor strengthening necessary for proper gait mechanics, 3# AW donned, 2x10 STS with arms stretched forward for forward  momentum, x10    Gait Training:  Ambulation around the gym down to the cafeteria and through EVS before returning to the gym.  Pt instructed on foot clearance solutions as pt is scuffing feet at times, and progressively worsens when fatiguing.  Pt notes that he is winded after ambulating that distance.  Pt also given instruction on staying within the walker for proper form and allowing for the pt to fully utilize his UE strength to prevent fatigue.  Ambulation with use of the speed ladder for visual feedback in regards to step length, performed forward and laterally to improve tolerance and stance time necessary for proper gait mechanics  TherEx:  LAQ 2 x 10 ea LE with 3# AW Seated BTB hamstring curl  2x10 each LE BTB hip abduction 2x10 Seated hip adduction with pball 2x10   PATIENT EDUCATION: Education details: Pt educated throughout session about proper posture and technique with exercises. Improved exercise technique, movement at target joints, use of target muscles after min to mod verbal, visual, tactile cues.  Person educated: Patient Education method: Explanation, Demonstration, and Verbal cues Education comprehension: verbalized understanding and returned demonstration  HOME EXERCISE PROGRAM: Access Code: [email or web address] URL: [email or web address]  Date: 12/11/2023 Prepared by: Marlynn Singer  Exercises - Seated March  - 1 x daily - 7 x weekly - 2 sets - 10 reps - Seated Long Arc Quad  - 1 x daily - 7 x weekly - 2 sets - 10 reps - 3 sec hold - Standing Knee Flexion AROM with Chair Support  - 1 x daily - 7 x weekly - 2 sets - 10 reps - Wide tandem with counter support as needed   - 1 x daily - 7 x weekly - 3 sets - 30 sec  hold  GOALS: Goals reviewed with patient? Yes  SHORT TERM GOALS: Target date: 01/18/2024  Patient will be independent in home exercise program to improve strength/mobility for better functional independence with ADLs. Baseline: Goal status:  INITIAL   LONG TERM GOALS: Target date:02/29/2024  Patient will increase SIS-16 score by at least 10 points  to demonstrate increased ease with ADLs and quality of life.  Baseline: 54 Goal status: INITIAL  2.  Patient (> 8 years old) will complete five times sit to stand test in < 15 seconds indicating an increased LE strength and improved balance. Baseline: 44.2 sec with use of BUE Goal status: INITIAL  3.  Patient will increase Berg Balance score by > 6 points to demonstrate decreased fall risk during functional activities Baseline: 34 Goal status: INITIAL  4.  Patient will increase 10 meter walk test to >1.80m/s as to improve gait speed for better community ambulation and to reduce fall risk. Baseline: 0.53 m/s with 4WW Goal status: INITIAL  5.  Patient will reduce timed up and go to <11 seconds to reduce fall risk and demonstrate improved transfer/gait ability. Baseline: 33 sec with 4WW Goal status: INITIAL  6. Patient will increase six minute walk test distance to >1000 for progression to community ambulator and improve gait ability  Baseline: 310 feet using a 4WW with CGA and only completes 3:17 sec prior to requesting to sit secondary to fatigue Goal status: INITIAL   ASSESSMENT:  CLINICAL IMPRESSION:  Pt performed well with the exercises and demonstrated some fatigue at the conclusion of the session.  Pt was able to progress to utilized HHA for navigation of the speed ladder during treatment today and pt performed well.  Pt did not have large enough step length to fully bring foot into the square at every step, however did take a larger step than normal ambulation.  Pt would benefit from continued progress towards proper gait mechanics.         OBJECTIVE IMPAIRMENTS: Abnormal gait, decreased activity tolerance, decreased balance, decreased endurance, decreased mobility, difficulty walking, decreased strength, impaired UE functional use, improper body mechanics,  postural dysfunction, and pain.   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   REHAB POTENTIAL: Good  CLINICAL DECISION MAKING: Evolving/moderate complexity  EVALUATION COMPLEXITY: Moderate  PLAN:  PT FREQUENCY: 1-2x/week  PT DURATION: 12 weeks  PLANNED INTERVENTIONS: 97164- PT Re-evaluation, 97750- Physical Performance Testing, 97110-Therapeutic exercises, 97530- Therapeutic activity, [id number]- Neuromuscular re-education, 97535- Self Care, 09811- Manual therapy, [id number]- Gait training, [phone]- Orthotic Initial, [id number]- Orthotic/Prosthetic subsequent, [phone]- Canalith repositioning, Patient/Family education, Balance training, Stair training, Taping,  Joint mobilization, Spinal mobilization, Vestibular training, DME instructions, Wheelchair mobility training, Cryotherapy, and Moist heat  PLAN FOR NEXT SESSION:  gait, balance, progress HEP as indicated.    Rozanna Corner, PT, DPT Physical Therapist - [facility]  12/26/23, 8:47 AM

## 2023-12-28 ENCOUNTER — Ambulatory Visit: Admitting: Speech Pathology

## 2023-12-28 ENCOUNTER — Ambulatory Visit: Admitting: Physical Therapy

## 2023-12-28 DIAGNOSIS — R471 Dysarthria and anarthria: Secondary | ICD-10-CM

## 2023-12-28 DIAGNOSIS — R262 Difficulty in walking, not elsewhere classified: Secondary | ICD-10-CM

## 2023-12-28 DIAGNOSIS — M6281 Muscle weakness (generalized): Secondary | ICD-10-CM

## 2023-12-28 DIAGNOSIS — R2681 Unsteadiness on feet: Secondary | ICD-10-CM

## 2023-12-28 NOTE — Therapy (Signed)
 OUTPATIENT PHYSICAL THERAPY NEURO TREATMENT   Patient Name: [name] [name] MRN: [id number] DOB:[birth year], 83 y.o., male Today's Date: 12/28/2023   PCP: Al Hover, MD REFERRING PROVIDER:   Sterling Eisenmenger, PA-C    END OF SESSION:  PT End of Session - 12/28/23 0930     Visit Number 7    Number of Visits 25    Date for PT Re-Evaluation 02/29/24    Progress Note Due on Visit 10    PT Start Time 0932    PT Stop Time 1015    PT Time Calculation (min) 43 min    Equipment Utilized During Treatment Gait belt    Activity Tolerance Patient tolerated treatment well    Behavior During Therapy Flat affect                Past Medical History:  Diagnosis Date   Arthritis    Depression    Diabetes mellitus without complication (HCC)    Gout    Hyperlipidemia    Hypertension    Ischemic cardiomyopathy    No past surgical history on file. 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   Ischemic cardiomyopathy 06/12/2018   Thrombocytosis 04/16/2016   Adjustment reaction with prolonged depressive reaction 01/22/2014   Visual loss, right eye 07/30/2012   Type 2 diabetes mellitus (HCC) 06/16/2011   Coronary artery disease 06/16/2011   Hypertension associated with diabetes (HCC) 06/16/2011    ONSET DATE: 11/16/2023  REFERRING DIAG: X32.440 (ICD-10-CM) - Acute ischemic left MCA stroke (HCC)   THERAPY DIAG:  Difficulty in walking, not elsewhere classified  Muscle weakness (generalized)  Unsteadiness on feet  Rationale for Evaluation and Treatment: Rehabilitation  SUBJECTIVE:                                                                                                                                                                                             SUBJECTIVE STATEMENT:   Pt reports no falls upon  arrival to the clinic.  Pt reports having a good weekend, although he didn't do anything fun, says that he's "too old for fun".  from eval: Pt is a pleasant 83 y/o male referred to PT s/p L MCA stroke. Pt also seeing speech as well. Pt with very weak voice, at times hx difficult to take as a result. He attempted to ambulate to clinic with 4WW but became too fatigued and required assistance into WC. Pt now presents to PT eval  in WC. He reports he primarily uses a 4WW at home, but does not ambulate much during the day (maybe ten minutes total). He reports difficulty with standing up from chairs. He must use grab bars in his bathroom due to difficulty standing up from toilet. He has difficulty with stairs, requires help getting up step to enter his home. He reports no recent falls, but that he does stumble with his 4WW when fatigued.  Pt accompanied by: self  PERTINENT HISTORY:  Via ED d/c note: "Presented 11/16/2023 to [facility] with right facial droop left gaze preference and dysarthria. CT/MRI showed small acute nonhemorrhagic left MCA distribution infarction involving the left frontal lobe. No associated mass effect. Patient did not receive TNK. CTA showed no large vessel occlusion or evidence of finding amenable to neurovascular intervention... 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 No none currently but does report arthritis pain in L shoulder limiting ability to lift/raise arm  PRECAUTIONS: Fall  RED FLAGS: None   WEIGHT BEARING RESTRICTIONS: No  FALLS: Has patient fallen in last 6 months? No but does report stumbling with fatigue  LIVING ENVIRONMENT: Lives with: lives with their family son and DIL Lives in: House/apartment, two level home but not using upstairs, stays on first floor Stairs: 1 step to enter home, says no handrails  but he has help getting up step Has following equipment at home: Walker - 4 wheeled, shower chair, and Grab bars  PLOF: Independent  PATIENT GOALS: everything: walking, balance, strength   OBJECTIVE:  Note: Objective measures were completed at Evaluation unless otherwise noted.  DIAGNOSTIC FINDINGS:  imaging via chart  MR brain 11/16/23: " IMPRESSION: 1. Small acute nonhemorrhagic left MCA distribution infarct involving the left frontal lobe. No associated mass effect. 2. Additional punctate subcentimeter acute ischemic nonhemorrhagic infarct within the contralateral right basal ganglia. 3. Underlying moderately advanced chronic microvascular ischemic disease with a few scattered remote lacunar infarcts as above. 4. Chronic occlusion of the left vertebral artery.     Electronically Signed   By: Virgia Griffins M.D.   On: 11/16/2023 23:48"  CT ANGIO HEAD NECK 11/16/23 " IMPRESSION: No large vessel occlusion or evidence of findings amenable to neurovascular intervention.   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   Multiple intracranial vascular stenoses as above. Focal short segment occlusion of an M2 inferior division branch of the right MCA with reconstitution noted.   Mild-to-moderate stenosis of the V4 segment right vertebral artery.   Emphysema (ICD10-J43.9).     Electronically Signed   By: Denny Flack M.D.   On: 11/16/2023 15:50"  CT HEAD 11/16/23: "IMPRESSION: 1. 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 2. Subtle hypoattenuation at the anterior limb of the right internal capsule. 3. 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 4. Age indeterminate infarct in the left lentiform nucleus. 5. Moderate atrophy  and white matter disease likely reflects the sequela of chronic microvascular ischemia. 6. No acute hemorrhage or mass lesion.   These results were called by telephone at the time of interpretation on 11/16/2023 at 3:13 pm to provider Dr. Doretta Gant, who verbally acknowledged these results.  Electronically Signed   By: Audree Leas M.D.   On: 11/16/2023 15:14"  COGNITION: Overall cognitive status: Within functional limits for tasks assessed but at times difficult to fully determine due to weakness of voice and some difficulty responding to PT questions as a result   SENSATION: WFL to light touch bilat UE and bilat LE  COORDINATION: WFL rapid alt movement UE and finger chin<>target bilat  WFL rapid alt movement LE and heel>shin bilat  EDEMA:  Pt reports no swelling     POSTURE: slight rounded shoulders, forward head, and increased thoracic kyphosis   LOWER EXTREMITY MMT:    Gross bilat LE strength is 4/5, more weakness found proximal than distal   BED MOBILITY:  Not assessed  TRANSFERS: Initially min a, then pt able to complete CGA   STAIRS: Not assessed but impaired per pt report GAIT: Findings: decreased gait speed (See ), FWD flexed posture with gait, decreased bilat heel strike, more pronounced on R than L side  FUNCTIONAL TESTS:  5 times sit to stand: 44.2 sec with use UE  Timed up and go (TUG): 33 second with 4WW  10 meter walk test: 0.53 m/s with 4WW  Berg Balance Scale: deferred : deferred  PATIENT SURVEYS:  SIS-16: 54                                                                                                                              TREATMENT DATE:    Gait with rollator x 13ft with mild foot drag for last 56ft. Additional gait with rollator to weave through equipment in therapy gym 2 x 119ft. Required cues for improved AD positioning to reduce risk of anterior LOB as well as safety in turns to improve turning technique. Cues  for hip flexion and heel contact throughout.   Side stepping with UE supported on mat 5ft x 6  Standing march to tap thigh on mat x 12 bil  Forward reverse gait with rollator 77ft x 4.  Box tap x 2 x 12 bil with cues for improved hip/knee flexion when returning to standing  Sit<>stand from WC 2 x 10 with UE support from arm.   CGA for safety throughout session with intermittent rest breaks due to RLE fatigue and mild knee pain. Pain resolves with rest.   PATIENT EDUCATION: Education details: Pt educated throughout session about proper posture and technique with exercises. Improved exercise technique, movement at target joints, use of target muscles after min to mod verbal, visual, tactile cues.  Person educated: Patient Education method: Explanation, Demonstration, and Verbal cues Education comprehension: verbalized understanding and returned demonstration  HOME EXERCISE PROGRAM: Access Code: [email or web address] URL: [email or web address] Date: 12/11/2023 Prepared by: Marlynn Singer  Exercises - Seated March  - 1 x daily - 7 x weekly - 2 sets - 10 reps - Seated Long Arc Quad  - 1 x daily - 7 x weekly - 2 sets - 10 reps - 3  sec hold - Standing Knee Flexion AROM with Chair Support  - 1 x daily - 7 x weekly - 2 sets - 10 reps - Wide tandem with counter support as needed   - 1 x daily - 7 x weekly - 3 sets - 30 sec  hold  GOALS: Goals reviewed with patient? Yes  SHORT TERM GOALS: Target date: 01/18/2024  Patient will be independent in home exercise program to improve strength/mobility for better functional independence with ADLs. Baseline: Goal status: INITIAL   LONG TERM GOALS: Target date:02/29/2024  Patient will increase SIS-16 score by at least 10 points  to demonstrate increased ease with ADLs and quality of life.  Baseline: 54 Goal status: INITIAL  2.  Patient (> 52 years old) will complete five times sit to stand test in < 15 seconds indicating an increased LE  strength and improved balance. Baseline: 44.2 sec with use of BUE Goal status: INITIAL  3.  Patient will increase Berg Balance score by > 6 points to demonstrate decreased fall risk during functional activities Baseline: 34 Goal status: INITIAL  4.  Patient will increase 10 meter walk test to >1.52m/s as to improve gait speed for better community ambulation and to reduce fall risk. Baseline: 0.53 m/s with 4WW Goal status: INITIAL  5.  Patient will reduce timed up and go to <11 seconds to reduce fall risk and demonstrate improved transfer/gait ability. Baseline: 33 sec with 4WW Goal status: INITIAL  6. Patient will increase six minute walk test distance to >1000 for progression to community ambulator and improve gait ability  Baseline: 310 feet using a 4WW with CGA and only completes 3:17 sec prior to requesting to sit secondary to fatigue Goal status: INITIAL   ASSESSMENT:  CLINICAL IMPRESSION:  Pt performed well with the exercises and demonstrated some fatigue at the conclusion of the session.  Cues for improved hip flexion, heel contact as well as control of 4ww to improve safety with turns in gait. Was noted to have increased knee flexion with stepping down from step and reverse gait following increased repetitions. Pt would benefit from continued progress towards proper gait mechanics as well as improved activity tolerance to allow return to PLOF and improved overall QoL.         OBJECTIVE IMPAIRMENTS: Abnormal gait, decreased activity tolerance, decreased balance, decreased endurance, decreased mobility, difficulty walking, decreased strength, impaired UE functional use, improper body mechanics, postural dysfunction, and pain.   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   REHAB POTENTIAL: Good  CLINICAL DECISION MAKING: Evolving/moderate complexity  EVALUATION COMPLEXITY: Moderate  PLAN:  PT FREQUENCY: 1-2x/week  PT DURATION: 12 weeks  PLANNED INTERVENTIONS: 97164- PT Re-evaluation, 97750- Physical Performance Testing, 97110-Therapeutic exercises, 97530- Therapeutic activity, [id number]- Neuromuscular re-education, 97535- Self Care, 56213- Manual therapy, [id number]- Gait training, [phone]- Orthotic Initial, [phone]- Orthotic/Prosthetic subsequent, [phone]- Canalith repositioning, Patient/Family education, Balance training, Stair training, Taping, Joint mobilization, Spinal mobilization, Vestibular training, DME instructions, Wheelchair mobility training, Cryotherapy, and Moist heat  PLAN FOR NEXT SESSION:    gait, balance, progress HEP as indicated.   Aurora Lees PT, DPT  Physical Therapist - [facility]  12:31 PM 12/28/23

## 2023-12-28 NOTE — Therapy (Signed)
 OUTPATIENT SPEECH LANGUAGE PATHOLOGY  Treatment Note   Patient Name: [name] [name] MRN: [id number] DOB:21-Oct-1940, 83 y.o., male Today's Date: 12/28/2023  PCP: [name] Morel, MD REFERRING PROVIDER: Georjean Kite, PA-C   End of Session - 12/28/23 0854     Visit Number 7    Number of Visits 25    Date for SLP Re-Evaluation 02/29/24    Authorization Type [employer] HMO/PPO    Progress Note Due on Visit 10    SLP Start Time 0845    SLP Stop Time  0920    SLP Time Calculation (min) 35 min    Activity Tolerance Patient tolerated treatment well             No past medical history on file.  The histories are not reviewed yet. Please review them in the "History" navigator section and refresh this SmartLink. 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   Ischemic cardiomyopathy 06/12/2018   Thrombocytosis 04/16/2016   Adjustment reaction with prolonged depressive reaction 01/22/2014   Visual loss, right eye 07/30/2012   Type 2 diabetes mellitus (HCC) 06/16/2011   Coronary artery disease 06/16/2011   Hypertension associated with diabetes (HCC) 06/16/2011    ONSET DATE: date of CVA 11/16/2023; date referral  12/05/2023  REFERRING DIAG:  N82.956 (ICD-10-CM) - Acute ischemic left MCA stroke (HCC)      THERAPY DIAG:  Dysarthria and anarthria  Rationale for Evaluation and Treatment Habilitation  SUBJECTIVE:   PERTINENT HISTORY and DIAGNOSTIC FINDINGS: Pt is a 83 y.o. male who presented to [facility] ED on 11/16/2023 with stroke symptoms. 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 Additional punctate subcentimeter acute ischemic nonhemorrhagic infarct was also present within the contralateral right basal ganglia. 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  CT scan revealed - Upper chest: Centrilobular emphysema in the visualized upper lobes with additional paraseptal emphysema in the lung apices most pronounced on the right.    PAIN:  Are you having pain? No   FALLS: Has patient fallen in last 6 months?  See PT evaluation for details  LIVING ENVIRONMENT: Lives with: lives with their family: pt lived independently prior to CVA with his brother, currently lives with his son [name] [name]) and his daughter-in-law [name] [name]) post stroke, she daughter [name] [name]) stays with him during the day while his son and DIL work Lives in: House/apartment  PLOF:  Level of assistance: Independent with ADLs, Independent with IADLs; pt reports finishing the 10th grade Employment: Retired   PATIENT GOALS      unable to state  SUBJECTIVE STATEMENT: Pt arrives with his son who left to go outside "and smoke" Pt missed ST earlier in the week because he left following PT Pt accompanied by: self  OBJECTIVE:   TODAY'S TREATMENT:  Skilled treatment session focused on pt's dysarthria goals. SLP facilitated session by providing the following interventions:  Pt arrived to session with regressed/reduced vocal intensity and <25% speech intelligiiblity at the word level, suspect d/t decreased respiratory support  Multiple activities were attempted as well as cue levels but all were unsuccessful in achieving increased vocal intensity, attempts made for pt to read functional phrases, we went outside  to facility a more natural environment as pt reports that "the other day I was able to talk to my daughter in the other room loudly" even with cues for pt to "yell hello, how are you?' Across the room to this writer were unsuccessful in increasing vocal intensity  Attempts to increase resistance to 25 cm H2Ocm were not successful d/t inability to produce quick  burst of air against this resistance and difficulty coordinating exercise  PATIENT EDUCATION: Education details: See above Person educated: Patient Education method: Explanation Education comprehension: needs further education   HOME EXERCISE PROGRAM: Practice EMST - pt has device Using "before your stroke speech" cue when reading functional phrases  GOALS: Goals reviewed with patient? Yes  SHORT TERM GOALS: Target date: 10 sessions  Pt will complete EMST w/ modA to facilitate increased breath support during speech production  Baseline: Goal status: INITIAL  2.  With maximal A, pt will recall compensatory speech strategies in 3/4 opportunities. Baseline:  Goal status: INITIAL  3.  With maximal A, pt will utilize compensatory speech strategies at phrase level to maintain 50% intelligibility. Baseline:  Goal status: INITIAL   LONG TERM GOALS: Target date: 02/29/2024  Pt will complete EMST with Min A to facilitate increased breath support for speech production.  Baseline:  Goal status: INITIAL  2.  With Moderate A, pt will utilize speech intelligibility strategies to achieve 75% speech intelligibility at the phrase level in a quiet environment to when expressing basic wants/needs.  Baseline:  Goal status: INITIAL   ASSESSMENT:  CLINICAL IMPRESSION: Patient is a 83 y.o. male who was seen today for a speech language treatment. Pt's expressive and receptive language abilities appear functional when communication basic wants/needs. Will continue to assess these abilities as well as cognitive communication abilities as speech intelligibility increases. Pt's most impairing deficits is his profound dysarthria that is c/b decreased respiratory support for phonation results in difficulty coordinating respiration and phonation and reduced vocal intensity. His speech intelligibility is subsequently reduced to ~ 25% at the phrase level. Pt with reduced awareness and slow progress which  reduces his prognosis as well as diagnose of pulmonary disease in chart "Upper chest: Centrilobular emphysema in the visualized upper lobes with additional paraseptal emphysema in the lung apices most pronounced on the right."   Pt continues with good effort towards all therapy tasks but was not able to improve speech intelligibility. See the above treatment note for details.   OBJECTIVE IMPAIRMENTS include dysphagia. These impairments are limiting patient from effectively communicating at home and in community. Factors affecting potential to achieve goals and functional outcome are severity of impairments and potential pulmonary disease. Patient will benefit from skilled SLP services to address above impairments and improve overall function.  REHAB POTENTIAL: Fair slow progress, reduced awareness, pulmonary disease  PLAN: SLP FREQUENCY: 1-2x/week  SLP DURATION: 12 weeks  PLANNED INTERVENTIONS: SLP instruction and feedback, Compensatory strategies, and Patient/family education    Elishia Kaczorowski B. Garlin Junker, M.S., CCC-SLP, Tree surgeon Certified Brain Injury Specialist [facility]  [facility] Office [phone] Ascom [phone] Fax [phone]

## 2024-01-02 ENCOUNTER — Encounter: Payer: Self-pay | Admitting: Physical Medicine and Rehabilitation

## 2024-01-02 ENCOUNTER — Ambulatory Visit: Admitting: Speech Pathology

## 2024-01-02 ENCOUNTER — Telehealth: Payer: Self-pay | Admitting: *Deleted

## 2024-01-02 DIAGNOSIS — R471 Dysarthria and anarthria: Secondary | ICD-10-CM | POA: Diagnosis not present

## 2024-01-02 NOTE — Telephone Encounter (Signed)
 Left voicemail message to call back

## 2024-01-02 NOTE — Therapy (Addendum)
 OUTPATIENT SPEECH LANGUAGE PATHOLOGY  Treatment Note   Patient Name: [name] [name] MRN: [id number] DOB:1941-04-08, 83 y.o., male Today's Date: 01/02/2024  PCP: Jerrlyn Morel, MD REFERRING PROVIDER: Georjean Kite, PA-C   End of Session - 01/02/24 1140     Visit Number 8    Number of Visits 25    Date for SLP Re-Evaluation 02/29/24    Authorization Type [employer] Medicare HMO/PPO    Progress Note Due on Visit 10    SLP Start Time 1140    SLP Stop Time  1220    SLP Time Calculation (min) 40 min    Activity Tolerance Patient tolerated treatment well             No past medical history on file.  The histories are not reviewed yet. Please review them in the "History" navigator section and refresh this SmartLink. 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   Ischemic cardiomyopathy 06/12/2018   Thrombocytosis 04/16/2016   Adjustment reaction with prolonged depressive reaction 01/22/2014   Visual loss, right eye 07/30/2012   Type 2 diabetes mellitus (HCC) 06/16/2011   Coronary artery disease 06/16/2011   Hypertension associated with diabetes (HCC) 06/16/2011    ONSET DATE: date of CVA 11/16/2023; date referral  12/05/2023  REFERRING DIAG:  W09.811 (ICD-10-CM) - Acute ischemic left MCA stroke (HCC)      THERAPY DIAG:  Dysarthria and anarthria  Rationale for Evaluation and Treatment Habilitation  SUBJECTIVE:   PERTINENT HISTORY and DIAGNOSTIC FINDINGS: Pt is a 83 y.o. male who presented to [facility] ED on 11/16/2023 with stroke symptoms. 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 Additional punctate subcentimeter acute ischemic nonhemorrhagic infarct was also present within the contralateral right basal ganglia. 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  CT scan revealed - Upper chest: Centrilobular emphysema in the visualized upper lobes with additional paraseptal emphysema in the lung apices most pronounced on the right.    PAIN:  Are you having pain? No   FALLS: Has patient fallen in last 6 months?  See PT evaluation for details  LIVING ENVIRONMENT: Lives with: lives with their family: pt lived independently prior to CVA with his brother, currently lives with his son [name]) and his daughter-in-law [name]) post stroke, she daughter [name]) stays with him during the day while his son and DIL work Lives in: House/apartment  PLOF:  Level of assistance: Independent with ADLs, Independent with IADLs; pt reports finishing the 10th grade Employment: Retired   PATIENT GOALS      unable to state  SUBJECTIVE STATEMENT: Pt arrives with good effort, when pushing pt down hallway to this writer's office, unable to hear pt's verbal response to social questions Pt accompanied by: self  OBJECTIVE:   TODAY'S TREATMENT:  Skilled treatment session focused on pt's dysarthria goals. SLP facilitated session by providing the following interventions:  Pt arrived to session with regressed/reduced vocal intensity and <25% speech intelligiiblity at the word level but also reports no increased effort level when producing 3 sets of 10 EMST set at 20 cmH2O but requires maximal verbal cues to produce quick (forceful) burst of air vs passively blowing into device.  Pt also stated that he is not aware that he is talking "at a whisper level" - This Clinical research associate recorded pt's speech production and played it back for him. He appeared surprised and stated "that sounds awful, I don't sound like that" Continued responses to conversational questions were recorded and replayed to build awareness. When pt was able to engage in louder speech (given  maximal cues) he was able to recognize the louder speech and increased speech intelligibility but was not able to replicate or describe the extra effort required to improve speech intelligibility  During use of iPad app Voice Analyst - his phonation was not able to be picked up d/t severity of decreased intensity and hypophonia  PATIENT EDUCATION: Education details: See above Person educated: Patient Education method: Explanation Education comprehension: needs further education   HOME EXERCISE PROGRAM: Practice EMST - pt has device Using "before your stroke speech" cue when reading functional phrases  GOALS: Goals reviewed with patient? Yes  SHORT TERM GOALS: Target date: 10 sessions  Pt will complete EMST w/ modA to facilitate increased breath support during speech production  Baseline: Goal status: INITIAL  2.  With maximal A, pt will recall compensatory speech strategies in 3/4 opportunities. Baseline:  Goal status: INITIAL  3.  With maximal A, pt will utilize compensatory speech strategies at phrase level to maintain 50% intelligibility. Baseline:  Goal status: INITIAL   LONG TERM GOALS: Target date: 02/29/2024  Pt will complete EMST with Min A to facilitate increased breath support for speech production.  Baseline:  Goal status: INITIAL  2.  With Moderate A, pt will utilize speech intelligibility strategies to achieve 75% speech intelligibility at the phrase level in a quiet environment to when expressing basic wants/needs.  Baseline:  Goal status: INITIAL   ASSESSMENT:  CLINICAL IMPRESSION: Patient is a 83 y.o. male who was seen today for a speech language treatment. Pt's expressive and receptive language abilities appear functional when communication basic wants/needs. Will continue to assess these abilities as well as cognitive communication abilities as speech intelligibility increases. Pt's most impairing deficits is his profound dysarthria that is c/b  decreased respiratory support for phonation results in difficulty coordinating respiration and phonation and reduced vocal intensity. His speech intelligibility is subsequently reduced to ~ 25% at the phrase level. Pt with reduced awareness and slow progress which reduces his prognosis as well as diagnose of pulmonary disease in chart "Upper chest: Centrilobular emphysema in the visualized upper lobes with additional paraseptal emphysema in the lung apices most pronounced on the right."   Pt continues with good participation in ST services but continues to lack awareness of speech impairment. Difficult to habituate given decreased caregiver attendance to sessions.  This Clinical research associate offered to call his daughter (she stays with him throughout the day) and his DIL (he is living with them) or have the son who brings him stay for the session. Pt polite but declined the offer for various reasons. Will continue to target habituation of louder speech. See the above treatment note for details.   OBJECTIVE IMPAIRMENTS include dysphagia. These impairments are limiting patient from effectively communicating at home and in community. Factors affecting potential to achieve goals and functional outcome are severity of impairments and potential pulmonary disease. Patient will benefit from skilled SLP services to address above impairments and improve overall function.  REHAB POTENTIAL: Fair slow progress, reduced awareness, pulmonary disease  PLAN: SLP FREQUENCY: 1-2x/week  SLP DURATION: 12 weeks  PLANNED INTERVENTIONS: SLP instruction and feedback, Compensatory  strategies, and Patient/family education    Kinzie Wickes B. Garlin Junker, M.S., CCC-SLP, Tree surgeon Certified Brain Injury Specialist [facility]  [facility] Services Office [phone] Ascom [phone] Fax [phone]

## 2024-01-02 NOTE — Telephone Encounter (Signed)
-----   Message from [place] Riddle sent at 01/02/2024  2:26 PM EDT ----- Regarding: FW: amb monitor Please call him ot schedule an appt with me to discuss his stroke and monitoring for afib. ----- Message ----- From: Dudley Ghee, RN Sent: 01/02/2024  12:08 PM EDT To: Adaline Holly, NP Subject: RE: amb monitor                                Monitor was returned unused. ----- Message ----- From: Riddle, Suzann, NP Sent: 01/01/2024   1:51 PM EDT To: Catarino Clines [facility] Triage Subject: amb monitor                                    Hello - this patient recently had a CVA and a ambulatory monitor was part of the workup to evaluate for A-fib.  I do not see that the monitor has resulted.  Please call the patient to see where we are with the monitor.  Is he wearing it and we are awaiting for results?  Has he worn it?  Thanks, Suzann

## 2024-01-04 ENCOUNTER — Other Ambulatory Visit: Payer: Self-pay

## 2024-01-04 ENCOUNTER — Ambulatory Visit: Admitting: Speech Pathology

## 2024-01-04 ENCOUNTER — Telehealth: Payer: Self-pay | Admitting: Physical Medicine and Rehabilitation

## 2024-01-04 ENCOUNTER — Other Ambulatory Visit: Payer: Self-pay | Admitting: Physical Medicine and Rehabilitation

## 2024-01-04 DIAGNOSIS — R471 Dysarthria and anarthria: Secondary | ICD-10-CM

## 2024-01-04 MED ORDER — ROSUVASTATIN CALCIUM 20 MG PO TABS
20.0000 mg | ORAL_TABLET | Freq: Every day | ORAL | 0 refills | Status: DC
  Filled 2024-01-04: qty 30, 30d supply, fill #0

## 2024-01-04 MED ORDER — LOSARTAN POTASSIUM 50 MG PO TABS
50.0000 mg | ORAL_TABLET | Freq: Every day | ORAL | 0 refills | Status: DC
  Filled 2024-01-04: qty 30, 30d supply, fill #0

## 2024-01-04 MED ORDER — TRAZODONE HCL 50 MG PO TABS
50.0000 mg | ORAL_TABLET | Freq: Every day | ORAL | 0 refills | Status: DC
  Filled 2024-01-04: qty 30, 30d supply, fill #0

## 2024-01-04 MED ORDER — METOPROLOL TARTRATE 25 MG PO TABS
25.0000 mg | ORAL_TABLET | Freq: Two times a day (BID) | ORAL | 0 refills | Status: DC
  Filled 2024-01-04: qty 60, 30d supply, fill #0

## 2024-01-04 MED ORDER — POLYETHYLENE GLYCOL 3350 17 GM/SCOOP PO POWD
17.0000 g | Freq: Every day | ORAL | 0 refills | Status: AC | PRN
  Filled 2024-01-04: qty 238, 14d supply, fill #0

## 2024-01-04 MED ORDER — MAGNESIUM GLUCONATE 500 (27 MG) MG PO TABS
500.0000 mg | ORAL_TABLET | Freq: Every day | ORAL | 0 refills | Status: AC
  Filled 2024-01-04: qty 30, 30d supply, fill #0

## 2024-01-04 MED ORDER — CLOPIDOGREL BISULFATE 75 MG PO TABS
75.0000 mg | ORAL_TABLET | Freq: Every day | ORAL | 0 refills | Status: DC
  Filled 2024-01-04: qty 30, 30d supply, fill #0

## 2024-01-04 MED ORDER — AMANTADINE HCL 100 MG PO CAPS
100.0000 mg | ORAL_CAPSULE | Freq: Every day | ORAL | 0 refills | Status: DC
  Filled 2024-01-04: qty 30, 30d supply, fill #0

## 2024-01-04 NOTE — Telephone Encounter (Signed)
 LVM to call back. Meds were sent to [facility].

## 2024-01-04 NOTE — Therapy (Signed)
 OUTPATIENT SPEECH LANGUAGE PATHOLOGY  Treatment Note   Patient Name: [name] [name] MRN: [id number] DOB:1941-06-26, 83 y.o., male Today's Date: 01/04/2024  PCP: Jerrlyn Morel, MD REFERRING PROVIDER: Georjean Kite, PA-C   End of Session - 01/04/24 1210     Visit Number 9    Number of Visits 25    Date for SLP Re-Evaluation 02/29/24    Authorization Type [employer] Medicare HMO/PPO    Progress Note Due on Visit 10    SLP Start Time 1145    SLP Stop Time  1220    SLP Time Calculation (min) 35 min    Activity Tolerance Patient tolerated treatment well             No past medical history on file.  The histories are not reviewed yet. Please review them in the "History" navigator section and refresh this SmartLink. 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   Ischemic cardiomyopathy 06/12/2018   Thrombocytosis 04/16/2016   Adjustment reaction with prolonged depressive reaction 01/22/2014   Visual loss, right eye 07/30/2012   Type 2 diabetes mellitus (HCC) 06/16/2011   Coronary artery disease 06/16/2011   Hypertension associated with diabetes (HCC) 06/16/2011    ONSET DATE: date of CVA 11/16/2023; date referral  12/05/2023  REFERRING DIAG:  W09.811 (ICD-10-CM) - Acute ischemic left MCA stroke (HCC)      THERAPY DIAG:  Dysarthria and anarthria  Rationale for Evaluation and Treatment Habilitation  SUBJECTIVE:   PERTINENT HISTORY and DIAGNOSTIC FINDINGS: Pt is a 83 y.o. male who presented to [facility] ED on 11/16/2023 with stroke symptoms. 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 Additional punctate subcentimeter acute ischemic nonhemorrhagic infarct was also present within the contralateral right basal ganglia. 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  CT scan revealed - Upper chest: Centrilobular emphysema in the visualized upper lobes with additional paraseptal emphysema in the lung apices most pronounced on the right.    PAIN:  Are you having pain? No   FALLS: Has patient fallen in last 6 months?  See PT evaluation for details  LIVING ENVIRONMENT: Lives with: lives with their family: pt lived independently prior to CVA with his brother, currently lives with his son [name]) and his daughter-in-law [name]) post stroke, she daughter [name]) stays with him during the day while his son and DIL work Lives in: House/apartment  PLOF:  Level of assistance: Independent with ADLs, Independent with IADLs; pt reports finishing the 10th grade Employment: Retired   PATIENT GOALS      unable to state  SUBJECTIVE STATEMENT: Pt arrived to sess immediately using loud voice Pt accompanied by: self  OBJECTIVE:   TODAY'S TREATMENT:  Skilled treatment session focused on pt's dysarthria goals. SLP facilitated session by providing the following interventions:  Audio feedback provided via Voice Analyst app on iPad - pt much improved during today's session, when listening to himself pt mentioned "I sounded really good that time. I could understand myself. I was down to a whisper the other day." Pt was Mod I for use of loud voice when wearing lists of functional phrases achieving 72 dB as compared to app not being able to  pick him up during previous session. He demonstrated awareness of fading at the end of sentences towards the ends of readings (68 dB). With rare Min A, pt able to sustain loudness (70 dB) when saying DOW, MOY and answering questions about his work in the [employer].   PATIENT EDUCATION: Education details: See above Person educated: Patient Education method: Explanation Education comprehension: needs further  education   HOME EXERCISE PROGRAM: Practice EMST - pt has device Using "before your stroke speech" cue when reading functional phrases  GOALS: Goals reviewed with patient? Yes  SHORT TERM GOALS: Target date: 10 sessions  Pt will complete EMST w/ modA to facilitate increased breath support during speech production  Baseline: Goal status: INITIAL  2.  With maximal A, pt will recall compensatory speech strategies in 3/4 opportunities. Baseline:  Goal status: INITIAL  3.  With maximal A, pt will utilize compensatory speech strategies at phrase level to maintain 50% intelligibility. Baseline:  Goal status: INITIAL   LONG TERM GOALS: Target date: 02/29/2024  Pt will complete EMST with Min A to facilitate increased breath support for speech production.  Baseline:  Goal status: INITIAL  2.  With Moderate A, pt will utilize speech intelligibility strategies to achieve 75% speech intelligibility at the phrase level in a quiet environment to when expressing basic wants/needs.  Baseline:  Goal status: INITIAL   ASSESSMENT:  CLINICAL IMPRESSION: Patient is a 83 y.o. male who was seen today for a speech language treatment. Pt's expressive and receptive language abilities appear functional when communication basic wants/needs. Will continue to assess these abilities as well as cognitive communication abilities as speech intelligibility increases. Pt's most impairing deficits is his profound dysarthria that is c/b decreased respiratory support for phonation results in difficulty coordinating respiration and phonation and reduced vocal intensity. His speech intelligibility is subsequently reduced to ~ 25% at the phrase level. Pt with reduced awareness and slow progress which reduces his prognosis as well as diagnose of pulmonary disease in chart "Upper chest: Centrilobular emphysema in the visualized upper lobes with additional paraseptal emphysema in the lung apices most pronounced on the  right."   Pt with great ability during today's session and as such improved speech intelligibility to 85 to 95%. See the above treatment note for details.   OBJECTIVE IMPAIRMENTS include dysphagia. These impairments are limiting patient from effectively communicating at home and in community. Factors affecting potential to achieve goals and functional outcome are severity of impairments and potential pulmonary disease. Patient will benefit from skilled SLP services to address above impairments and improve overall function.  REHAB POTENTIAL: Fair slow progress, reduced awareness, pulmonary disease  PLAN: SLP FREQUENCY: 1-2x/week  SLP DURATION: 12 weeks  PLANNED INTERVENTIONS: SLP instruction and feedback, Compensatory strategies, and Patient/family education    Ison Wichmann B. Garlin Junker, M.S., CCC-SLP, Tree surgeon Certified Brain Injury Specialist [facility]  [facility] Rehabilitation Services Office [phone] Ascom [phone] Fax [phone]

## 2024-01-04 NOTE — Telephone Encounter (Signed)
 Spoke with patients son and he stated that patient is in rehab facility. He reports that they were told he did not need to wear it so they sent it back. Scheduled appointment for him to come in and see Suzann Riddle NP

## 2024-01-04 NOTE — Telephone Encounter (Signed)
 Ps son called and stated he needs refills on all of his meds. He said he also sent a msg on my chart regarding this   [facility] [address] (not the [facility] in [employer]) [place]

## 2024-01-08 ENCOUNTER — Encounter: Attending: Physical Medicine and Rehabilitation | Admitting: Physical Medicine and Rehabilitation

## 2024-01-08 ENCOUNTER — Other Ambulatory Visit: Payer: Self-pay

## 2024-01-08 ENCOUNTER — Encounter: Payer: Self-pay | Admitting: Physical Medicine and Rehabilitation

## 2024-01-08 VITALS — BP 165/89 | HR 76 | Ht 72.0 in | Wt 186.4 lb

## 2024-01-08 DIAGNOSIS — I152 Hypertension secondary to endocrine disorders: Secondary | ICD-10-CM | POA: Diagnosis present

## 2024-01-08 DIAGNOSIS — E1159 Type 2 diabetes mellitus with other circulatory complications: Secondary | ICD-10-CM | POA: Diagnosis present

## 2024-01-08 DIAGNOSIS — R498 Other voice and resonance disorders: Secondary | ICD-10-CM | POA: Diagnosis not present

## 2024-01-08 DIAGNOSIS — I63512 Cerebral infarction due to unspecified occlusion or stenosis of left middle cerebral artery: Secondary | ICD-10-CM | POA: Diagnosis not present

## 2024-01-08 DIAGNOSIS — Z794 Long term (current) use of insulin: Secondary | ICD-10-CM

## 2024-01-08 MED ORDER — AMLODIPINE BESYLATE 2.5 MG PO TABS
2.5000 mg | ORAL_TABLET | Freq: Every day | ORAL | 3 refills | Status: DC
  Filled 2024-01-08: qty 90, 90d supply, fill #0

## 2024-01-08 NOTE — Patient Instructions (Addendum)
 HTN: Try hibiscus tea twice per day -Advised checking BP daily at home and logging results to bring into follow-up appointment with PCP and myself. -Reviewed BP meds today.  -Advised regarding healthy foods that can help lower blood pressure and provided with a list: 1) citrus foods- high in vitamins and minerals 2) salmon and other fatty fish - reduces inflammation and oxylipins 3) swiss chard (leafy green)- high level of nitrates 4) pumpkin seeds- one of the best natural sources of magnesium  5) Beans and lentils- high in fiber, magnesium , and potassium 6) Berries- high in flavonoids 7) Amaranth (whole grain, can be cooked similarly to rice and oats)- high in magnesium  and fiber 8) Pistachios- even more effective at reducing BP than other nuts 9) Carrots- high in phenolic compounds that relax blood vessels and reduce inflammation 10) Celery- contain phthalides that relax tissues of arterial walls 11) Tomatoes- can also improve cholesterol and reduce risk of heart disease 12) Broccoli- good source of magnesium , calcium , and potassium 13) Greek yogurt: high in potassium and calcium  14) Herbs and spices: Celery seed, cilantro, saffron, lemongrass, black cumin, ginseng, cinnamon, cardamom, sweet basil, and ginger 15) Chia and flax seeds- also help to lower cholesterol and blood sugar 16) Beets- high levels of nitrates that relax blood vessels  17) spinach and bananas- high in potassium  -Provided lise of supplements that can help with hypertension:  1) magnesium : one high quality brand is Bioptemizers since it contains all 7 types of magnesium , otherwise over the counter magnesium  gluconate 400mg  is a good option 2) B vitamins 3) vitamin D  4) potassium 5) CoQ10 6) L-arginine 7) Vitamin C 8) Beetroot -Educated that goal BP is 120/80. -Made goal to incorporate some of the above foods into diet.

## 2024-01-08 NOTE — Progress Notes (Signed)
 Subjective:    Patient ID: [name] [name], male    DOB: February 05, 1941, 83 y.o.   MRN: [id number]  HPI Mr. Comella is an 83 year old man who presents for HFU of CVA.  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 not want to follow-up with ENT  3) HTN: -has not been checking BP at home  Pain Inventory Average Pain 0 Pain Right Now 0 My pain is intermittent, dull, and aching  LOCATION OF PAIN  Left shoulder  BOWEL Number of stools per week: 2 Oral laxative use No  Type of laxative Milk of Magnesium  Enema or suppository use No  History of colostomy No  Incontinent No   BLADDER Normal In and out cath, frequency n/a Able to self cath No  Bladder incontinence No  Frequent urination No  Leakage with coughing No  Difficulty starting stream No  Incomplete bladder emptying No    Mobility walk with assistance use a walker  Function retired  Neuro/Psych No problems in this area  Prior Studies Any changes since last visit?  no  Physicians involved in your care Any changes since last visit?  no   Family History  Problem Relation Age of Onset   Diabetes Sister    Social History   Socioeconomic History   Marital status: Widowed    Spouse name: Not on file   Number of children: Not on file   Years of education: Not on file   Highest education level: Not on file  Occupational History   Not on file  Tobacco Use   Smoking status: Former    Current packs/day: 0.00    Types: Cigarettes    Quit date: 08/08/1989    Years since quitting: 34.4   Smokeless tobacco: Never  Substance and Sexual Activity   Alcohol use: Yes    Alcohol/week: 3.0 standard drinks of alcohol    Types: 3 Shots of liquor per week   Drug use: Never   Sexual activity: Not on file  Other Topics Concern   Not on file  Social History Narrative   Not on file   Social Drivers of  Health   Financial Resource Strain: Not on file  Food Insecurity: Patient Unable To Answer (11/16/2023)   Hunger Vital Sign    Worried About Running Out of Food in the Last Year: Patient unable to answer    Ran Out of Food in the Last Year: Patient unable to answer  Transportation Needs: Patient Unable To Answer (11/16/2023)   PRAPARE - Transportation    Lack of Transportation (Medical): Patient unable to answer    Lack of Transportation (Non-Medical): Patient unable to answer  Physical Activity: Not on file  Stress: Not on file  Social Connections: Patient Unable To Answer (11/16/2023)   Social Connection and Isolation Panel [NHANES]    Frequency of Communication with Friends and Family: Patient unable to answer    Frequency of Social Gatherings with Friends and Family: Patient unable to answer    Attends Religious Services: Patient unable to answer    Active Member of Clubs or Organizations: Patient unable to answer    Attends Banker Meetings: Patient unable to answer    Marital Status: Patient unable to answer   No past surgical history on file. Past Medical History:  Diagnosis Date   Arthritis    Depression    Diabetes  mellitus without complication (HCC)    Gout    Hyperlipidemia    Hypertension    Ischemic cardiomyopathy    BP (!) 165/89 (BP Location: Right Arm, Patient Position: Sitting)   Pulse 76   Ht 6' (1.829 m)   Wt 186 lb 6.4 oz (84.6 kg)   SpO2 96%   BMI 25.28 kg/m   Opioid Risk Score:   Fall Risk Score:  `1  Depression screen [facility] 2/9     01/08/2024    2:18 PM 12/16/2020    3:47 PM  Depression screen PHQ 2/9  Decreased Interest 0 0  Down, Depressed, Hopeless 0 0  PHQ - 2 Score 0 0     Review of Systems +hypophpnia     Objective:   Physical Exam Gen: no distress, normal appearing HEENT: oral mucosa pink and moist, NCAT Cardio: Reg rate Chest: normal effort, normal rate of breathing Abd: soft, non-distended Ext: no edema Psych:  pleasant, normal affect Skin: intact Neuro: Alert and oriented x3, 5/5 strength in upper and lower extremities, Slow but steady gait with RW       Assessment & Plan:  1) CVA -continue outpatient therapy -discussed that his kids have been taking good care of him  2) Hypophonia: -continue outpatient therapy -discussed ENT referral but he defers  3) HTN: -BP is 165/89 -continue cozaar  -add amlodipine 2.5mg  daily -Advised checking BP daily at home and logging results to bring into follow-up appointment with PCP and myself. -Reviewed BP meds today.  -Advised regarding healthy foods that can help lower blood pressure and provided with a list: 1) citrus foods- high in vitamins and minerals 2) salmon and other fatty fish - reduces inflammation and oxylipins 3) swiss chard (leafy green)- high level of nitrates 4) pumpkin seeds- one of the best natural sources of magnesium  5) Beans and lentils- high in fiber, magnesium , and potassium 6) Berries- high in flavonoids 7) Amaranth (whole grain, can be cooked similarly to rice and oats)- high in magnesium  and fiber 8) Pistachios- even more effective at reducing BP than other nuts 9) Carrots- high in phenolic compounds that relax blood vessels and reduce inflammation 10) Celery- contain phthalides that relax tissues of arterial walls 11) Tomatoes- can also improve cholesterol and reduce risk of heart disease 12) Broccoli- good source of magnesium , calcium , and potassium 13) Greek yogurt: high in potassium and calcium  14) Herbs and spices: Celery seed, cilantro, saffron, lemongrass, black cumin, ginseng, cinnamon, cardamom, sweet basil, and ginger 15) Chia and flax seeds- also help to lower cholesterol and blood sugar 16) Beets- high levels of nitrates that relax blood vessels  17) spinach and bananas- high in potassium  -Provided lise of supplements that can help with hypertension:  1) magnesium : one high quality brand is Bioptemizers since  it contains all 7 types of magnesium , otherwise over the counter magnesium  gluconate 400mg  is a good option 2) B vitamins 3) vitamin D  4) potassium 5) CoQ10 6) L-arginine 7) Vitamin C 8) Beetroot -Educated that goal BP is 120/80. -Made goal to incorporate some of the above foods into diet.

## 2024-01-09 ENCOUNTER — Ambulatory Visit: Attending: Physician Assistant

## 2024-01-09 ENCOUNTER — Ambulatory Visit: Admitting: Speech Pathology

## 2024-01-09 ENCOUNTER — Other Ambulatory Visit: Payer: Self-pay

## 2024-01-09 DIAGNOSIS — R262 Difficulty in walking, not elsewhere classified: Secondary | ICD-10-CM | POA: Diagnosis present

## 2024-01-09 DIAGNOSIS — M6281 Muscle weakness (generalized): Secondary | ICD-10-CM | POA: Diagnosis present

## 2024-01-09 DIAGNOSIS — R471 Dysarthria and anarthria: Secondary | ICD-10-CM | POA: Insufficient documentation

## 2024-01-09 DIAGNOSIS — R490 Dysphonia: Secondary | ICD-10-CM | POA: Insufficient documentation

## 2024-01-09 DIAGNOSIS — R278 Other lack of coordination: Secondary | ICD-10-CM | POA: Insufficient documentation

## 2024-01-09 DIAGNOSIS — R2681 Unsteadiness on feet: Secondary | ICD-10-CM | POA: Insufficient documentation

## 2024-01-09 DIAGNOSIS — R41841 Cognitive communication deficit: Secondary | ICD-10-CM | POA: Diagnosis present

## 2024-01-09 NOTE — Therapy (Signed)
 OUTPATIENT PHYSICAL THERAPY NEURO TREATMENT   Patient Name: [name] [name] MRN: [id number] DOB:04/15/1941, 83 y.o., male Today's Date: 01/09/2024   PCP: Al Hover, MD REFERRING PROVIDER:   Sterling Eisenmenger, PA-C    END OF SESSION:       Past Medical History:  Diagnosis Date   Arthritis    Depression    Diabetes mellitus without complication (HCC)    Gout    Hyperlipidemia    Hypertension    Ischemic cardiomyopathy    No past surgical history on file. 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   Ischemic cardiomyopathy 06/12/2018   Thrombocytosis 04/16/2016   Adjustment reaction with prolonged depressive reaction 01/22/2014   Visual loss, right eye 07/30/2012   Type 2 diabetes mellitus (HCC) 06/16/2011   Coronary artery disease 06/16/2011   Hypertension associated with diabetes (HCC) 06/16/2011    ONSET DATE: 11/16/2023  REFERRING DIAG: H84.696 (ICD-10-CM) - Acute ischemic left MCA stroke (HCC)   THERAPY DIAG:  No diagnosis found.  Rationale for Evaluation and Treatment: Rehabilitation  SUBJECTIVE:                                                                                                                                                                                             SUBJECTIVE STATEMENT:   Pt reports no falls, no pain, exercises going well. Energy is low.  from eval: Pt is a pleasant 83 y/o male referred to PT s/p L MCA stroke. Pt also seeing speech as well. Pt with very weak voice, at times hx difficult to take as a result. He attempted to ambulate to clinic with 4WW but became too fatigued and required assistance into WC. Pt now presents to PT eval in WC. He reports he primarily uses a 4WW at home, but does not ambulate much during the day (maybe ten minutes total). He reports  difficulty with standing up from chairs. He must use grab bars in his bathroom due to difficulty standing up from toilet. He has difficulty with stairs, requires help getting up step to enter his home. He reports no recent falls, but that he does stumble with his 4WW when fatigued.  Pt accompanied by: self  PERTINENT HISTORY:  Via ED d/c note: "Presented 11/16/2023 to [facility] with right facial droop left gaze preference and dysarthria. CT/MRI showed small acute nonhemorrhagic left MCA distribution infarction involving the left frontal lobe. No associated mass effect. Patient did not receive TNK. CTA  showed no large vessel occlusion or evidence of finding amenable to neurovascular intervention... 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 No none currently but does report arthritis pain in L shoulder limiting ability to lift/raise arm  PRECAUTIONS: Fall  RED FLAGS: None   WEIGHT BEARING RESTRICTIONS: No  FALLS: Has patient fallen in last 6 months? No but does report stumbling with fatigue  LIVING ENVIRONMENT: Lives with: lives with their family son and DIL Lives in: House/apartment, two level home but not using upstairs, stays on first floor Stairs: 1 step to enter home, says no handrails but he has help getting up step Has following equipment at home: Walker - 4 wheeled, shower chair, and Grab bars  PLOF: Independent  PATIENT GOALS: everything: walking, balance, strength   OBJECTIVE:  Note: Objective measures were completed at Evaluation unless otherwise noted.  DIAGNOSTIC FINDINGS:  imaging via chart  MR brain 11/16/23: " IMPRESSION: 1. Small acute nonhemorrhagic left MCA distribution infarct involving the left frontal lobe. No associated mass effect. 2. Additional punctate subcentimeter acute ischemic nonhemorrhagic infarct  within the contralateral right basal ganglia. 3. Underlying moderately advanced chronic microvascular ischemic disease with a few scattered remote lacunar infarcts as above. 4. Chronic occlusion of the left vertebral artery.     Electronically Signed   By: Virgia Griffins M.D.   On: 11/16/2023 23:48"  CT ANGIO HEAD NECK 11/16/23 " IMPRESSION: No large vessel occlusion or evidence of findings amenable to neurovascular intervention.   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   Multiple intracranial vascular stenoses as above. Focal short segment occlusion of an M2 inferior division branch of the right MCA with reconstitution noted.   Mild-to-moderate stenosis of the V4 segment right vertebral artery.   Emphysema (ICD10-J43.9).     Electronically Signed   By: Denny Flack M.D.   On: 11/16/2023 15:50"  CT HEAD 11/16/23: "IMPRESSION: 1. 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 2. Subtle hypoattenuation at the anterior limb of the right internal capsule. 3. 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 4. Age indeterminate infarct in the left lentiform nucleus. 5. Moderate atrophy and white matter disease likely reflects the sequela of chronic microvascular ischemia. 6. No acute hemorrhage or mass lesion.   These results were called by telephone at the time of interpretation on 11/16/2023 at 3:13 pm to provider Dr. Doretta Gant, who verbally acknowledged these results.     Electronically Signed   By: Audree Leas M.D.   On: 11/16/2023 15:14"  COGNITION: Overall cognitive status: Within functional limits for tasks assessed but at times difficult to fully determine due to weakness of voice and some difficulty responding to PT  questions as a result   SENSATION: WFL to light touch bilat UE and bilat LE  COORDINATION: WFL rapid alt movement UE and finger chin<>target bilat  WFL rapid alt movement LE and heel>shin bilat  EDEMA:  Pt reports no swelling     POSTURE: slight rounded shoulders, forward head, and increased thoracic kyphosis   LOWER EXTREMITY MMT:    Gross bilat LE strength is 4/5, more weakness found proximal than distal  BED MOBILITY:  Not assessed  TRANSFERS: Initially min a, then pt able to complete CGA   STAIRS: Not assessed but impaired per pt report GAIT: Findings: decreased gait speed (See ), FWD flexed posture with gait, decreased bilat heel strike, more pronounced on R than L side  FUNCTIONAL TESTS:  5 times sit to stand: 44.2 sec with use UE  Timed up and go (TUG): 33 second with 4WW  10 meter walk test: 0.53 m/s with 4WW  Berg Balance Scale: deferred : deferred  PATIENT SURVEYS:  SIS-16: 54                                                                                                                              TREATMENT DATE:    TA: Gait with rollator x 296 ft with continued intermittent mild foot drag throughout/able to correct when cued. Rates medium.  Seated march 2x12 each LE   STS 10x with use of BUE, performed at support surface. Fatiguing  Seated march 10x   STS 4x with use of BUE   Additional gait with rollator >180 ft to weave through equipment in therapy gym and complete one full lap. Continued cuing for heel strike, consistently improved on R but decreased on L, able to intermittently correct.   Seated LAQ with 1.5# aw each LE 2x10 each LE   Alt LE step-tap on 6" step 2x10 alt LE with 1.5# aw each LE  LTL step out with 1.5# aw, 2x10 each LE   STS 4x    PATIENT EDUCATION: Education details: Pt educated throughout session about proper posture and technique with exercises. Improved exercise technique, movement at target joints,  use of target muscles after min to mod verbal, visual, tactile cues.  Person educated: Patient Education method: Explanation, Demonstration, and Verbal cues Education comprehension: verbalized understanding and returned demonstration  HOME EXERCISE PROGRAM: Access Code: [email or web address] URL: [email or web address] Date: 12/11/2023 Prepared by: Marlynn Singer  Exercises - Seated March  - 1 x daily - 7 x weekly - 2 sets - 10 reps - Seated Long Arc Quad  - 1 x daily - 7 x weekly - 2 sets - 10 reps - 3 sec hold - Standing Knee Flexion AROM with Chair Support  - 1 x daily - 7 x weekly - 2 sets - 10 reps - Wide tandem with counter support as needed   - 1 x daily - 7 x weekly - 3 sets - 30 sec  hold  GOALS: Goals reviewed with patient? Yes  SHORT TERM GOALS: Target date: 01/18/2024  Patient will be independent in home exercise program to improve strength/mobility for better functional independence with ADLs. Baseline: Goal status: INITIAL   LONG TERM GOALS: Target date:02/29/2024  Patient will increase SIS-16 score by at least 10 points  to demonstrate increased ease with ADLs and quality of life.  Baseline: 54 Goal status: INITIAL  2.  Patient (> 66 years old) will complete  five times sit to stand test in < 15 seconds indicating an increased LE strength and improved balance. Baseline: 44.2 sec with use of BUE Goal status: INITIAL  3.  Patient will increase Berg Balance score by > 6 points to demonstrate decreased fall risk during functional activities Baseline: 34 Goal status: INITIAL  4.  Patient will increase 10 meter walk test to >1.62m/s as to improve gait speed for better community ambulation and to reduce fall risk. Baseline: 0.53 m/s with 4WW Goal status: INITIAL  5.  Patient will reduce timed up and go to <11 seconds to reduce fall risk and demonstrate improved transfer/gait ability. Baseline: 33 sec with 4WW Goal status: INITIAL  6. Patient will increase six  minute walk test distance to >1000 for progression to community ambulator and improve gait ability  Baseline: 310 feet using a 4WW with CGA and only completes 3:17 sec prior to requesting to sit secondary to fatigue Goal status: INITIAL   ASSESSMENT:  CLINICAL IMPRESSION:  Continued focus on LE strengthening and VC/demo to promote improved gait mechanics (bilat heel strike) to reduce risk of falls. Pt able to intermittently correct L heel strike, shows consistent improvement with RLE. The pt would benefit from continued progress towards proper gait mechanics as well as improved activity tolerance to allow return to PLOF and improved overall QoL.         OBJECTIVE IMPAIRMENTS: Abnormal gait, decreased activity tolerance, decreased balance, decreased endurance, decreased mobility, difficulty walking, decreased strength, impaired UE functional use, improper body mechanics, postural dysfunction, and pain.   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   REHAB POTENTIAL: Good  CLINICAL DECISION MAKING: Evolving/moderate complexity  EVALUATION COMPLEXITY: Moderate  PLAN:  PT FREQUENCY: 1-2x/week  PT DURATION: 12 weeks  PLANNED INTERVENTIONS: 97164- PT Re-evaluation, 97750- Physical Performance Testing, 97110-Therapeutic exercises, 97530- Therapeutic activity, [id number]- Neuromuscular re-education, 97535- Self Care, 96045- Manual therapy, [id number]- Gait training, [phone]- Orthotic Initial, [phone]- Orthotic/Prosthetic subsequent, [phone]- Canalith repositioning,  Patient/Family education, Balance training, Stair training, Taping, Joint mobilization, Spinal mobilization, Vestibular training, DME instructions, Wheelchair mobility training, Cryotherapy, and Moist heat  PLAN FOR NEXT SESSION:    gait, balance, progress HEP as indicated.   Aminta Kales PT, DPT  Physical Therapist -   [facility]  7:55 AM 01/09/24

## 2024-01-09 NOTE — Therapy (Unsigned)
 OUTPATIENT SPEECH LANGUAGE PATHOLOGY  Treatment Note 10th VISIT TREATMENT NOTE   Patient Name: [name] [name] MRN: [id number] DOB:Oct 07, 1940, 83 y.o., male Today's Date: 01/09/2024  PCP: Jerrlyn Morel, MD REFERRING PROVIDER: Georjean Kite, PA-C  Speech Therapy Progress Note  Dates of Reporting Period: 12/07/2023 to 01/09/2024  Objective: Patient has been seen for 10 speech therapy sessions this reporting period targeting pt's dysarthria (hypophonia). Patient's progress towards LTGs have been minimal. See skilled intervention, clinical impressions, and goals below for details.    End of Session - 01/09/24 1429     Visit Number 10    Number of Visits 25    Date for SLP Re-Evaluation 02/29/24    Authorization Type [employer] HMO/PPO    Progress Note Due on Visit 10    SLP Start Time 0845    SLP Stop Time  0920    SLP Time Calculation (min) 35 min    Activity Tolerance Patient tolerated treatment well             No past medical history on file.  The histories are not reviewed yet. Please review them in the "History" navigator section and refresh this SmartLink. 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   Ischemic cardiomyopathy 06/12/2018   Thrombocytosis 04/16/2016   Adjustment reaction with prolonged depressive reaction 01/22/2014   Visual loss, right eye 07/30/2012   Type 2 diabetes mellitus (HCC) 06/16/2011   Coronary artery disease 06/16/2011   Hypertension associated with diabetes (HCC) 06/16/2011    ONSET DATE: date of CVA 11/16/2023; date referral  12/05/2023  REFERRING DIAG:  B14.782 (ICD-10-CM) - Acute ischemic left MCA stroke (HCC)      THERAPY DIAG:  Dysarthria and anarthria  Rationale for Evaluation and Treatment Habilitation  SUBJECTIVE:   PERTINENT HISTORY and DIAGNOSTIC FINDINGS: Pt is  a 83 y.o. male who presented to [facility] ED on 11/16/2023 with stroke symptoms. 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 Additional punctate subcentimeter acute ischemic nonhemorrhagic infarct was also present within the contralateral right basal ganglia. He has a PMH of DM2, HTN, HLD, obstructive CAD s/p BMS to mid LAD (2012), ischemic cardiomyopathy, chronic thrombocytosis, frequent PVCs Pt admitted to CIR on 11/21/2023 thru 12/05/2023.  CT scan revealed - Upper chest: Centrilobular emphysema in the visualized upper lobes with additional paraseptal emphysema in the lung apices most pronounced on the right.    PAIN:  Are you having pain? No   FALLS: Has patient fallen in last 6 months?  See PT evaluation for details  LIVING ENVIRONMENT: Lives with: lives with their family: pt lived independently prior to CVA with his brother, currently lives with his son [name] [name]) and his daughter-in-law [name] [name]) post stroke, she daughter [name] [name]) stays with him during the day while his son and DIL work Lives in: House/apartment  PLOF:  Level of assistance: Independent with ADLs, Independent with IADLs; pt reports finishing the 10th grade Employment: Retired   PATIENT GOALS      unable to state  SUBJECTIVE STATEMENT: Pt arrived to sess immediately using loud voice Pt accompanied by: self  OBJECTIVE:   TODAY'S TREATMENT:  Skilled treatment session focused on pt's dysarthria goals. SLP facilitated session by providing the following interventions:  Pt arrived to session reporting inability to talk beyond a whisper. Water provided throughout  the session, audiofeedback provided as well as maximal multimodal cues provided in attempt to increase vocal intensity. Attempts were not successful. Voice analyst was not able to pick up pt's phonation to register pitch or vocal intensity. With pt's permisiso, this Clinical research associate called his son [name] [name]) to gather more  information. Pt's son states that when pt is mad or yelling he can "push it (his voice) thru." Discussed possible recommendation for ENT evaluation.    Attempts made to increase EMST during today's session were not successful beyond 25 cm H2O. "The energy is just not there."    PATIENT EDUCATION: Education details: See above Person educated: Patient Education method: Explanation Education comprehension: needs further education   HOME EXERCISE PROGRAM: Practice EMST - pt has device Using "before your stroke speech" cue when reading functional phrases  GOALS: Goals reviewed with patient? Yes  SHORT TERM GOALS: Target date: 10 sessions  Updated: 01/09/2024 Pt will complete EMST w/ modA to facilitate increased breath support during speech production  Baseline: Goal status: INITIAL: pt able to complete with intermittent Min A but has not been able to progress beyond 25 cmH2O  2.  With maximal A, pt will recall compensatory speech strategies in 3/4 opportunities. Baseline:  Goal status: INITIAL: Not met  3.  With maximal A, pt will utilize compensatory speech strategies at phrase level to maintain 50% intelligibility. Baseline:  Goal status: INITIAL: Not Met   LONG TERM GOALS: Target date: 02/29/2024  Updated: 01/09/2024 Pt will complete EMST with Min A to facilitate increased breath support for speech production.  Baseline:  Goal status: INITIAL: unable to progress beyond 25 cmH2O (pt has been at this resistance EMST introduced on 11/22/2023  2.  With Moderate A, pt will utilize speech intelligibility strategies to achieve 75% speech intelligibility at the phrase level in a quiet environment to when expressing basic wants/needs.  Baseline:  Goal status: INITIAL: not met, unable to demonstrate functional improvement/consistent improvement given maximal multimodal assistance   ASSESSMENT:  CLINICAL IMPRESSION: Patient is a 83 y.o. 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  Pt has made slower than anticipated progress with little appreciable improvement in his dysarthria, specifically hypophonia. Respiratory strength, intellectual awareness, audio feedback, structured vs unstructured natural settings have been provided/targeted and maximal multimodal cues provided. While pt has demonstrated the ability to increased vocal intensity in ~ 2 out of 10 sessions, his ability to demonstrate consistent ability beyond 1 session has not happened. Will continue to address with pt and follow up with his son but in the absence of functional progress, suspect pt is close to meeting maximal benefit from ST services.     OBJECTIVE IMPAIRMENTS include dysphagia. These impairments are limiting patient from effectively communicating at home and in community. Factors affecting potential to achieve goals and functional outcome are severity of impairments and potential pulmonary disease. Patient will benefit from skilled SLP services to address above impairments and improve overall function.  REHAB POTENTIAL: Fair slow progress, reduced awareness, pulmonary disease  PLAN: SLP FREQUENCY: 1-2x/week  SLP DURATION: 12 weeks  PLANNED INTERVENTIONS: SLP instruction and feedback, Compensatory strategies, and Patient/family education    Tlaloc Taddei B. Garlin Junker, M.S., CCC-SLP, Tree surgeon Certified Brain Injury Specialist [facility]  [facility] Rehabilitation Services Office [phone] Ascom [phone] Fax [phone]

## 2024-01-11 ENCOUNTER — Ambulatory Visit: Attending: Cardiology | Admitting: Cardiology

## 2024-01-11 ENCOUNTER — Ambulatory Visit

## 2024-01-11 ENCOUNTER — Ambulatory Visit: Admitting: Speech Pathology

## 2024-01-11 DIAGNOSIS — M6281 Muscle weakness (generalized): Secondary | ICD-10-CM | POA: Diagnosis not present

## 2024-01-11 DIAGNOSIS — R471 Dysarthria and anarthria: Secondary | ICD-10-CM

## 2024-01-11 DIAGNOSIS — R262 Difficulty in walking, not elsewhere classified: Secondary | ICD-10-CM

## 2024-01-11 NOTE — Progress Notes (Deleted)
 Electrophysiology Clinic Note    Date:  01/11/2024  Patient ID:  [name] [name] 02/20/1941, MRN [id number] PCP:  Al Hover, MD  Cardiologist:  None Electrophysiologist: None  ***refresh  Discussed the use of AI scribe software for clinical note transcription with the patient, who gave verbal consent to proceed.   Patient Profile    Chief Complaint: ***  History of Present Illness: [name] [name] is a 83 y.o. male with PMH notable for CAD s/p PCI, HFmrEF, ICM, HTN, HLD, PVCs, T2DM, recent cryptogenic stroke; seen today for post hospital follow up.    Admitted 4/10-4/13/2025 for R-sided facial droop combined with inability to speak. MRI positive for acute CVA. He was discharged with an ambulatory monitor which was not completed. He was discharged to inpatient rehab.   He saw PCP yesterday, ASA started, BP elevated but planning to monitor. Referred back to cardiology.  On follow-up today, ***    Since discharge from hospital the patient reports doing ***.  he denies chest pain, palpitations, dyspnea, PND, orthopnea, nausea, vomiting, dizziness, syncope, edema, weight gain, or early satiety.      Arrhythmia/Device History No specialty comments available.    ROS:  Please see the history of present illness. All other systems are reviewed and otherwise negative.    Physical Exam    VS:  There were no vitals taken for this visit. BMI: There is no height or weight on file to calculate BMI.  Wt Readings from Last 3 Encounters:  01/08/24 186 lb 6.4 oz (84.6 kg)  11/21/23 195 lb 12.3 oz (88.8 kg)  11/17/23 203 lb 4.2 oz (92.2 kg)     GEN- The patient is well appearing, alert and oriented x 3 today.   Lungs- Clear to ausculation bilaterally, normal work of breathing.  Heart- {Blank single:19197::"Regular","Irregularly irregular"} rate and rhythm, no murmurs, rubs or gallops Extremities- {EDEMA LEVEL:28147::"No"} peripheral edema, warm, dry    Studies  Reviewed   Previous EP, cardiology notes.    EKG is ordered. Personal review of EKG from today shows:  ***        TTE, 11/17/2023  1. Left ventricular ejection fraction, by estimation, is 40 to 45%. The left ventricle has mildly decreased function. 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 There is moderate left ventricular hypertrophy. Left ventricular diastolic parameters are consistent with Grade I diastolic dysfunction (impaired relaxation).   2. Right ventricular systolic function is normal. The right ventricular size is normal.   3. The mitral valve is normal in structure. No evidence of mitral valve regurgitation. No evidence of mitral stenosis.   4. The aortic valve is calcified. There is mild calcification of the aortic valve. Aortic valve regurgitation is not visualized. Aortic valve sclerosis/calcification is present, without any evidence of aortic stenosis.   5. The inferior vena cava is normal in size with greater than 50% respiratory variability, suggesting right atrial pressure of 3 mmHg.   TTE, 07/11/2018 (via care everywhere)   MILD LV DYSFUNCTION (See above, 45%)    NORMAL LA PRESSURES WITH DIASTOLIC DYSFUNCTION    NORMAL RIGHT VENTRICULAR SYSTOLIC FUNCTION    VALVULAR REGURGITATION: TRIVIAL TR    NO VALVULAR STENOSIS    TRIVIAL PERICARDIAL EFFUSION    ECTOPY THROUGHOUT EXAM    NO PRIOR STUDY FOR COMPARISON    Assessment and Plan     #)  cryptogenic stroke   #) HFmrEF #) ICM #) CAD s/p PCI    {Are you ordering a CV Procedure (e.g. stress test, cath, DCCV, TEE, etc)?   Press F2        :[id number]}   Current medicines are reviewed at length with the patient today.   The patient {ACTIONS; HAS/DOES NOT HAVE:19233} concerns regarding his medicines.  The following changes were made today:  {NONE  DEFAULTED:18576}  Labs/ tests ordered today include: *** No orders of the defined types were placed in this encounter.    Disposition: Follow up with {EPMDS:28135::"EP Team"} or EP APP {EPFOLLOW UP:28173}   Signed, Adaline Holly, NP  01/11/24  12:33 PM  Electrophysiology [facility]

## 2024-01-11 NOTE — Therapy (Signed)
 OUTPATIENT PHYSICAL THERAPY NEURO TREATMENT   Patient Name: [name] [name] MRN: [id number] DOB:02/20/1941, 83 y.o., male Today's Date: 01/11/2024   PCP: Al Hover, MD REFERRING PROVIDER:   Sterling Eisenmenger, PA-C    END OF SESSION:       Past Medical History:  Diagnosis Date   Arthritis    Depression    Diabetes mellitus without complication (HCC)    Gout    Hyperlipidemia    Hypertension    Ischemic cardiomyopathy    No past surgical history on file. 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   Ischemic cardiomyopathy 06/12/2018   Thrombocytosis 04/16/2016   Adjustment reaction with prolonged depressive reaction 01/22/2014   Visual loss, right eye 07/30/2012   Type 2 diabetes mellitus (HCC) 06/16/2011   Coronary artery disease 06/16/2011   Hypertension associated with diabetes (HCC) 06/16/2011    ONSET DATE: 11/16/2023  REFERRING DIAG: Q46.962 (ICD-10-CM) - Acute ischemic left MCA stroke (HCC)   THERAPY DIAG:  No diagnosis found.  Rationale for Evaluation and Treatment: Rehabilitation  SUBJECTIVE:                                                                                                                                                                                             SUBJECTIVE STATEMENT:   Pt felt tired after last visit. Pt reports BP was running high yesterday.    from eval: Pt is a pleasant 83 y/o male referred to PT s/p L MCA stroke. Pt also seeing speech as well. Pt with very weak voice, at times hx difficult to take as a result. He attempted to ambulate to clinic with 4WW but became too fatigued and required assistance into WC. Pt now presents to PT eval in WC. He reports he primarily uses a 4WW at home, but does not ambulate much during the day (maybe ten minutes total). He reports  difficulty with standing up from chairs. He must use grab bars in his bathroom due to difficulty standing up from toilet. He has difficulty with stairs, requires help getting up step to enter his home. He reports no recent falls, but that he does stumble with his 4WW when fatigued.  Pt accompanied by: self  PERTINENT HISTORY:  Via ED d/c note: "Presented 11/16/20[facility] to [facility] with right facial droop left gaze preference and dysarthria. CT/MRI showed small acute nonhemorrhagic left MCA distribution infarction involving the left frontal lobe. No associated mass effect. Patient did not  receive TNK. CTA showed no large vessel occlusion or evidence of finding amenable to neurovascular intervention... 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 No none currently but does report arthritis pain in L shoulder limiting ability to lift/raise arm  PRECAUTIONS: Fall  RED FLAGS: None   WEIGHT BEARING RESTRICTIONS: No  FALLS: Has patient fallen in last 6 months? No but does report stumbling with fatigue  LIVING ENVIRONMENT: Lives with: lives with their family son and DIL Lives in: House/apartment, two level home but not using upstairs, stays on first floor Stairs: 1 step to enter home, says no handrails but he has help getting up step Has following equipment at home: Walker - 4 wheeled, shower chair, and Grab bars  PLOF: Independent  PATIENT GOALS: everything: walking, balance, strength   OBJECTIVE:  Note: Objective measures were completed at Evaluation unless otherwise noted.  DIAGNOSTIC FINDINGS:  imaging via chart  MR brain 11/16/23: " IMPRESSION: 1. Small acute nonhemorrhagic left MCA distribution infarct involving the left frontal lobe. No associated mass effect. 2. Additional punctate subcentimeter acute ischemic nonhemorrhagic infarct  within the contralateral right basal ganglia. 3. Underlying moderately advanced chronic microvascular ischemic disease with a few scattered remote lacunar infarcts as above. 4. Chronic occlusion of the left vertebral artery.     Electronically Signed   By: Virgia Griffins M.D.   On: 11/16/2023 23:48"  CT ANGIO HEAD NECK 11/16/23 " IMPRESSION: No large vessel occlusion or evidence of findings amenable to neurovascular intervention.   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   Multiple intracranial vascular stenoses as above. Focal short segment occlusion of an M2 inferior division branch of the right MCA with reconstitution noted.   Mild-to-moderate stenosis of the V4 segment right vertebral artery.   Emphysema (ICD10-J43.9).     Electronically Signed   By: Denny Flack M.D.   On: 11/16/2023 15:50"  CT HEAD 11/16/23: "IMPRESSION: 1. 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 2. Subtle hypoattenuation at the anterior limb of the right internal capsule. 3. 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 4. Age indeterminate infarct in the left lentiform nucleus. 5. Moderate atrophy and white matter disease likely reflects the sequela of chronic microvascular ischemia. 6. No acute hemorrhage or mass lesion.   These results were called by telephone at the time of interpretation on 11/16/2023 at 3:13 pm to provider Dr. Doretta Gant, who verbally acknowledged these results.     Electronically Signed   By: Audree Leas M.D.   On: 11/16/2023 15:14"  COGNITION: Overall cognitive status: Within functional limits for tasks assessed but at times difficult to fully determine due to weakness of voice and some difficulty responding to PT  questions as a result   SENSATION: WFL to light touch bilat UE and bilat LE  COORDINATION: WFL rapid alt movement UE and finger chin<>target bilat  WFL rapid alt movement LE and heel>shin bilat  EDEMA:  Pt reports no swelling     POSTURE: slight rounded shoulders, forward head, and increased thoracic kyphosis   LOWER EXTREMITY MMT:    Gross bilat LE strength is 4/5, more weakness found proximal  than distal   BED MOBILITY:  Not assessed  TRANSFERS: Initially min a, then pt able to complete CGA   STAIRS: Not assessed but impaired per pt report GAIT: Findings: decreased gait speed (See ), FWD flexed posture with gait, decreased bilat heel strike, more pronounced on R than L side  FUNCTIONAL TESTS:  5 times sit to stand: 44.2 sec with use UE  Timed up and go (TUG): 33 second with 4WW  10 meter walk test: 0.53 m/s with 4WW  Berg Balance Scale: deferred : deferred  PATIENT SURVEYS:  SIS-16: 54                                                                                                                              TREATMENT DATE:   TA: BP taken prior to treatment as pt reports recent high BPs Resting, seated:  138/81 mmHg HR 67   Gait with rollator 2x 148 ft, heavy BUE Wbing. Very fatiguing  Nustep cardio and endurance training, PT provides cuing in resistance/SPM and overall technique, aim for moderate intensity. Lvl 1 x 3 min Lvl 2 x 3 min  Other comments: SPM maintained 20s-50s   Seated march 1x12 each LE, then with 2# aw donned 1x12 each LE   LAQ with 2# aw -  1x6. 1x5 each LE Rates medium  STS 10x, 6x with heavy use of BUE. Pt rates medium     PATIENT EDUCATION: Education details: Pt educated throughout session about proper posture and technique with exercises. Improved exercise technique, movement at target joints, use of target muscles after min to mod verbal, visual, tactile cues.  Person educated: Patient Education method:  Explanation, Demonstration, and Verbal cues Education comprehension: verbalized understanding and returned demonstration  HOME EXERCISE PROGRAM: Access Code: [email or web address] URL: [email or web address] Date: 12/11/2023 Prepared by: Marlynn Singer  Exercises - Seated March  - 1 x daily - 7 x weekly - 2 sets - 10 reps - Seated Long Arc Quad  - 1 x daily - 7 x weekly - 2 sets - 10 reps - 3 sec hold - Standing Knee Flexion AROM with Chair Support  - 1 x daily - 7 x weekly - 2 sets - 10 reps - Wide tandem with counter support as needed   - 1 x daily - 7 x weekly - 3 sets - 30 sec  hold  GOALS: Goals reviewed with patient? Yes  SHORT TERM GOALS: Target date: 01/18/2024  Patient will be independent in home exercise program to improve strength/mobility for better functional independence with ADLs. Baseline: Goal status: INITIAL   LONG TERM GOALS: Target date:02/29/2024  Patient will increase SIS-16 score by at least 10 points  to demonstrate increased ease with ADLs and quality of life.  Baseline: 54 Goal status: INITIAL  2.  Patient (> 50 years old) will complete five times sit to stand test in < 15 seconds indicating an increased LE strength and improved balance. Baseline:  44.2 sec with use of BUE Goal status: INITIAL  3.  Patient will increase Berg Balance score by > 6 points to demonstrate decreased fall risk during functional activities Baseline: 34 Goal status: INITIAL  4.  Patient will increase 10 meter walk test to >1.5m/s as to improve gait speed for better community ambulation and to reduce fall risk. Baseline: 0.53 m/s with 4WW Goal status: INITIAL  5.  Patient will reduce timed up and go to <11 seconds to reduce fall risk and demonstrate improved transfer/gait ability. Baseline: 33 sec with 4WW Goal status: INITIAL  6. Patient will increase six minute walk test distance to >1000 for progression to community ambulator and improve gait ability  Baseline: 310  feet using a 4WW with CGA and only completes 3:17 sec prior to requesting to sit secondary to fatigue Goal status: INITIAL   ASSESSMENT:  CLINICAL IMPRESSION: Pt tolerated interventions fair today, still limited by quick onset of fatigue. He attempted a combination of gait with 4WW followed by nustep to try to maintain cardio conditioning. He was able to complete these activities, and while tired, rated no greater than medium. The pt would benefit from continued progress towards proper gait mechanics as well as improved activity tolerance to allow return to PLOF and improved overall QoL.         OBJECTIVE IMPAIRMENTS: Abnormal gait, decreased activity tolerance, decreased balance, decreased endurance, decreased mobility, difficulty walking, decreased strength, impaired UE functional use, improper body mechanics, postural dysfunction, and pain.   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   REHAB POTENTIAL: Good  CLINICAL DECISION MAKING: Evolving/moderate complexity  EVALUATION COMPLEXITY: Moderate  PLAN:  PT FREQUENCY: 1-2x/week  PT DURATION: 12 weeks  PLANNED INTERVENTIONS: 97164- PT Re-evaluation, 97750- Physical Performance Testing, 97110-Therapeutic exercises, 97530- Therapeutic activity, [id number]- Neuromuscular re-education, 97535- Self Care, 65784- Manual therapy, [id number]- Gait training, [phone]- Orthotic Initial, [phone]- Orthotic/Prosthetic subsequent, [phone]- Canalith repositioning, Patient/Family education, Balance training, Stair training,  Taping, Joint mobilization, Spinal mobilization, Vestibular training, DME instructions, Wheelchair mobility training, Cryotherapy, and Moist heat  PLAN FOR NEXT SESSION:    gait, balance, progress HEP as indicated.   Aminta Kales PT, DPT  Physical Therapist - [facility]  [facility]  7:59 AM 01/11/24

## 2024-01-11 NOTE — Therapy (Signed)
 OUTPATIENT SPEECH LANGUAGE PATHOLOGY  Treatment Note   Patient Name: [name] [name] MRN: [id number] DOB:1941/07/29, 83 y.o., male Today's Date: 01/11/2024  PCP: [name] Morel, MD REFERRING PROVIDER: Georjean Kite, PA-C    End of Session - 01/11/24 1146     Visit Number 11    Number of Visits 25    Date for SLP Re-Evaluation 02/29/24    Authorization Type [employer] HMO/PPO    Progress Note Due on Visit 20    SLP Start Time 0845    SLP Stop Time  0920    SLP Time Calculation (min) 35 min    Activity Tolerance Patient tolerated treatment well             No past medical history on file.  The histories are not reviewed yet. Please review them in the "History" navigator section and refresh this SmartLink. 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   Ischemic cardiomyopathy 06/12/2018   Thrombocytosis 04/16/2016   Adjustment reaction with prolonged depressive reaction 01/22/2014   Visual loss, right eye 07/30/2012   Type 2 diabetes mellitus (HCC) 06/16/2011   Coronary artery disease 06/16/2011   Hypertension associated with diabetes (HCC) 06/16/2011    ONSET DATE: date of CVA 11/16/2023; date referral  12/05/2023  REFERRING DIAG:  F62.130 (ICD-10-CM) - Acute ischemic left MCA stroke (HCC)      THERAPY DIAG:  Dysarthria and anarthria  Rationale for Evaluation and Treatment Habilitation  SUBJECTIVE:   PERTINENT HISTORY and DIAGNOSTIC FINDINGS: Pt is a 83 y.o. male who presented to [facility] ED on 11/16/2023 with stroke symptoms. 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 Additional punctate subcentimeter acute ischemic nonhemorrhagic infarct was also present within the contralateral right basal ganglia. 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  CT scan revealed - Upper chest: Centrilobular emphysema in the visualized upper lobes with additional paraseptal emphysema in the lung apices most pronounced on the right.    PAIN:  Are you having pain? No   FALLS: Has patient fallen in last 6 months?  See PT evaluation for details  LIVING ENVIRONMENT: Lives with: lives with their family: pt lived independently prior to CVA with his brother, currently lives with his son [name] [name]) and his daughter-in-law [name] [name]) post stroke, she daughter [name] [name]) stays with him during the day while his son and DIL work Lives in: House/apartment  PLOF:  Level of assistance: Independent with ADLs, Independent with IADLs; pt reports finishing the 10th grade Employment: Retired   PATIENT GOALS      unable to state  SUBJECTIVE STATEMENT: Pt arrived to session with soft voice, but reports ability to use louder voice when he was mad yesterday evening Pt accompanied by: self  OBJECTIVE:   TODAY'S TREATMENT:  Skilled treatment session focused on pt's dysarthria goals. SLP facilitated session by providing the following interventions:  Pt reports "I used my voice last night, I was right proud of my self - I got mad and I told him (my brother) to stop covering me with the blanket and he finally got the message."   When cued to use the same voice in session, pt was able to achieve  73 dB when same "Stop that" but then unable to continue to replicate increased vocal intensity. When reading known list of functional phrases he was able to achieve some improve phonation with vocal intensity of 70 dB with maximal multimodal cues.   In response to continued high level of cues with no improvement, pt responded "when I am at home, I feel like I get out of breath so I shut up. I don't know, it is a mystery to me."  When performing EMST he  continues (even with maximal assistance) to be unable to blow into device with resistance beyond 25 cmH2O.    PATIENT EDUCATION: Education details: See above Person educated: Patient Education method: Explanation Education comprehension: needs further education   HOME EXERCISE PROGRAM: Practice EMST - pt has device Using "before your stroke speech" cue when reading functional phrases  GOALS: Goals reviewed with patient? Yes  SHORT TERM GOALS: Target date: 10 sessions  Updated: 01/09/2024 Pt will complete EMST w/ modA to facilitate increased breath support during speech production  Baseline: Goal status: INITIAL: pt able to complete with intermittent Min A but has not been able to progress beyond 25 cmH2O  2.  With maximal A, pt will recall compensatory speech strategies in 3/4 opportunities. Baseline:  Goal status: INITIAL: Not met  3.  With maximal A, pt will utilize compensatory speech strategies at phrase level to maintain 50% intelligibility. Baseline:  Goal status: INITIAL: Not Met   LONG TERM GOALS: Target date: 02/29/2024  Updated: 01/09/2024 Pt will complete EMST with Min A to facilitate increased breath support for speech production.  Baseline:  Goal status: INITIAL: unable to progress beyond 25 cmH2O (pt has been at this resistance EMST introduced on 11/22/2023  2.  With Moderate A, pt will utilize speech intelligibility strategies to achieve 75% speech intelligibility at the phrase level in a quiet environment to when expressing basic wants/needs.  Baseline:  Goal status: INITIAL: not met, unable to demonstrate functional improvement/consistent improvement given maximal multimodal assistance   ASSESSMENT:  CLINICAL IMPRESSION: Patient is a 83 y.o. 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  Pt has consult with [name] [name] this afternoon and the following message was sent to her via secure chat.   We wanted to  reachout in advance of [name]'s appt with you this afternoon (I think it is at 2pm).  He has been regularly attending PT and ST as an outpatient since his CVA on 11/16/2023.   We are reaching out because he iss not progressing in therapy and we wonder if there might be an underlaying cardiopulmonary issue/reason. In speech therapy, we have targeted improving respiratory support for phonation but he remains severely hypophonic. He has not been able to progress beyond 25 cmH2O on the Expiratory Muscle Strengthener (began at 25 cmH2O and is unable to blow into device with even the slightest increase). He has been able to demonstrate increased vocal intensity/phonation x 3 times but we can't get beyond that. This inability doesn't seem commensuarte with sites of lesions. Clinically it appears to be respiratory related (?) but he doesn't have any diagnosis that would support that assumption. I had thought about referring him to ENT but he doesn't have symptoms of a voice disorder but am willing to if it would helpful.    His family have not been attending the sessions with him (they can't) and they do demonstrate poor health literacy but I have done everything to  compensate for these barriers but simply cannot improve his drive/energy/respiratory support/phonation.     OBJECTIVE IMPAIRMENTS include dysphagia. These impairments are limiting patient from effectively communicating at home and in community. Factors affecting potential to achieve goals and functional outcome are severity of impairments and potential pulmonary disease. Patient will benefit from skilled SLP services to address above impairments and improve overall function.  REHAB POTENTIAL: Fair slow progress, reduced awareness, pulmonary disease  PLAN: SLP FREQUENCY: 1-2x/week  SLP DURATION: 12 weeks  PLANNED INTERVENTIONS: SLP instruction and feedback, Compensatory strategies, and Patient/family education    Zunairah Devers B. Garlin Junker,  M.S., CCC-SLP, Tree surgeon Certified Brain Injury Specialist [facility]  [facility] Rehabilitation Services Office [phone] Ascom [phone] Fax [phone]

## 2024-01-15 ENCOUNTER — Ambulatory Visit

## 2024-01-15 ENCOUNTER — Ambulatory Visit: Admitting: Speech Pathology

## 2024-01-15 DIAGNOSIS — R2681 Unsteadiness on feet: Secondary | ICD-10-CM

## 2024-01-15 DIAGNOSIS — R262 Difficulty in walking, not elsewhere classified: Secondary | ICD-10-CM

## 2024-01-15 DIAGNOSIS — R278 Other lack of coordination: Secondary | ICD-10-CM

## 2024-01-15 DIAGNOSIS — M6281 Muscle weakness (generalized): Secondary | ICD-10-CM | POA: Diagnosis not present

## 2024-01-15 DIAGNOSIS — R471 Dysarthria and anarthria: Secondary | ICD-10-CM

## 2024-01-15 NOTE — Therapy (Signed)
 OUTPATIENT SPEECH LANGUAGE PATHOLOGY  Treatment Note DISCHARGE SUMMARY   Patient Name: [name] [name] MRN: [id number] DOB:25-Aug-1940, 83 y.o., male Today's Date: 01/15/2024  PCP: Jerrlyn Morel, MD REFERRING PROVIDER: Georjean Kite, PA-C    End of Session - 01/15/24 1252     Visit Number 12    Number of Visits 25    Date for SLP Re-Evaluation 02/29/24    Authorization Type [employer] HMO/PPO    Progress Note Due on Visit 20    SLP Start Time 1100    SLP Stop Time  1135    SLP Time Calculation (min) 35 min    Activity Tolerance Patient tolerated treatment well             No past medical history on file.  The histories are not reviewed yet. Please review them in the "History" navigator section and refresh this SmartLink. 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   Ischemic cardiomyopathy 06/12/2018   Thrombocytosis 04/16/2016   Adjustment reaction with prolonged depressive reaction 01/22/2014   Visual loss, right eye 07/30/2012   Type 2 diabetes mellitus (HCC) 06/16/2011   Coronary artery disease 06/16/2011   Hypertension associated with diabetes (HCC) 06/16/2011    ONSET DATE: date of CVA 11/16/2023; date referral  12/05/2023  REFERRING DIAG:  G95.621 (ICD-10-CM) - Acute ischemic left MCA stroke (HCC)      THERAPY DIAG:  Dysarthria and anarthria  Rationale for Evaluation and Treatment Habilitation  SUBJECTIVE:   PERTINENT HISTORY and DIAGNOSTIC FINDINGS: Pt is a 83 y.o. male who presented to [facility] ED on 11/16/2023 with stroke symptoms. 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 Additional punctate subcentimeter acute ischemic nonhemorrhagic infarct was also present within the contralateral right basal ganglia. He has a PMH of DM2, HTN, HLD, obstructive  CAD s/p BMS to mid LAD (2012), ischemic cardiomyopathy, chronic thrombocytosis, frequent PVCs Pt admitted to CIR on 11/21/2023 thru 12/05/2023.  CT scan revealed - Upper chest: Centrilobular emphysema in the visualized upper lobes with additional paraseptal emphysema in the lung apices most pronounced on the right.    PAIN:  Are you having pain? No   FALLS: Has patient fallen in last 6 months?  See PT evaluation for details  LIVING ENVIRONMENT: Lives with: lives with their family: pt lived independently prior to CVA with his brother, currently lives with his son [name]) and his daughter-in-law [name]) post stroke, she daughter [name]) stays with him during the day while his son and DIL work Lives in: House/apartment  PLOF:  Level of assistance: Independent with ADLs, Independent with IADLs; pt reports finishing the 10th grade Employment: Retired   PATIENT GOALS      unable to state  SUBJECTIVE STATEMENT: Pt states that they went to wrong place last week; unable to find cardiology Pt accompanied by: self  OBJECTIVE:   TODAY'S TREATMENT:  Skilled treatment session focused on pt's dysarthria goals. SLP facilitated session by providing the following interventions:  Pt reports inability to find cardiologist's office, this Clinical research associate provided written directions. Pt voiced understanding and said he would have his son call them to make an appointment.   Pt continues with dysarthria/hypophonia as resultant reduced speech intelligibility. Pt commented "on the way over here I was speaking fine and now that I am here I can't  talk." Pt reports increased fatigue from traveling to appt and getting in and out of car, suspect that fatigue is a component of respiratory drive for phonation.   Pt unable to tolerate increased resistance during today's session for EMST and unable to increase respiratory support to achieve full phonation.    PATIENT EDUCATION: Education details: See above Person  educated: Patient Education method: Explanation Education comprehension: needs further education   HOME EXERCISE PROGRAM: Practice EMST - pt has device Using "before your stroke speech" cue when reading functional phrases  GOALS: Goals reviewed with patient? Yes  SHORT TERM GOALS: Target date: 10 sessions  Updated: 01/09/2024 Pt will complete EMST w/ modA to facilitate increased breath support during speech production  Baseline: Goal status: INITIAL: pt able to complete with intermittent Min A but has not been able to progress beyond 25 cmH2O  2.  With maximal A, pt will recall compensatory speech strategies in 3/4 opportunities. Baseline:  Goal status: INITIAL: Not met  3.  With maximal A, pt will utilize compensatory speech strategies at phrase level to maintain 50% intelligibility. Baseline:  Goal status: INITIAL: Not Met   LONG TERM GOALS: Target date: 02/29/2024  Updated: 01/09/2024 Pt will complete EMST with Min A to facilitate increased breath support for speech production.  Baseline:  Goal status: INITIAL: unable to progress beyond 25 cmH2O (pt has been at this resistance EMST introduced on 11/22/2023  2.  With Moderate A, pt will utilize speech intelligibility strategies to achieve 75% speech intelligibility at the phrase level in a quiet environment to when expressing basic wants/needs.  Baseline:  Goal status: INITIAL: not met, unable to demonstrate functional improvement/consistent improvement given maximal multimodal assistance   ASSESSMENT:  CLINICAL IMPRESSION: Patient is a 83 y.o. 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  Pt appears to have reached maximal benefit form skilled ST services as evidenced by lack of progress towards goals. Education has been provided to pt and previously to his son [name]) on hypophonia/decreased respiratory support and lack of progress in skilled ST.   After reviewing chart, pt  does have a history of heart failure following his MI in 2012. Will defer to cardiology and/or referral to pulmonology before referring to ENT. Pt voiced understanding and stated that he would give the information to his son (this Clinical research associate offered to call pt's son but pt stated he would tell him).   PLAN:  Pt has met maximal benefit from skilled ST services. All education has been completed and is pt appropriate for discharge.   Alondria Mousseau B. Garlin Junker, M.S., CCC-SLP, Tree surgeon Certified Brain Injury Specialist [facility]  [facility] Office [phone] Ascom [phone] Fax [phone]

## 2024-01-15 NOTE — Therapy (Signed)
 OUTPATIENT PHYSICAL THERAPY NEURO TREATMENT/Physical Therapy Progress Note   Dates of reporting period  12/07/2023   to   01/15/2024    Patient Name: [name] [name] MRN: [id number] DOB:1941/03/10, 83 y.o., male Today's Date: 01/15/2024   PCP: Al Hover, MD REFERRING PROVIDER:   Sterling Eisenmenger, PA-C    END OF SESSION:  PT End of Session - 01/15/24 1148     Visit Number 10    Number of Visits 25    Date for PT Re-Evaluation 02/29/24    Progress Note Due on Visit 20    PT Start Time 1148    PT Stop Time 1229    PT Time Calculation (min) 41 min    Equipment Utilized During Treatment Gait belt    Activity Tolerance Patient tolerated treatment well    Behavior During Therapy Flat affect                 Past Medical History:  Diagnosis Date   Arthritis    Depression    Diabetes mellitus without complication (HCC)    Gout    Hyperlipidemia    Hypertension    Ischemic cardiomyopathy    History reviewed. No pertinent surgical history. 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   Ischemic cardiomyopathy 06/12/2018   Thrombocytosis 04/16/2016   Adjustment reaction with prolonged depressive reaction 01/22/2014   Visual loss, right eye 07/30/2012   Type 2 diabetes mellitus (HCC) 06/16/2011   Coronary artery disease 06/16/2011   Hypertension associated with diabetes (HCC) 06/16/2011    ONSET DATE: 11/16/2023  REFERRING DIAG: N82.956 (ICD-10-CM) - Acute ischemic left MCA stroke (HCC)   THERAPY DIAG:  Muscle weakness (generalized)  Difficulty in walking, not elsewhere classified  Other lack of coordination  Unsteadiness on feet  Rationale for Evaluation and Treatment: Rehabilitation  SUBJECTIVE:                                                                                                                                                                                              SUBJECTIVE STATEMENT:   Pt reports no new issues today. Ok with retesting for progress note.   From eval: Pt is a pleasant 83 y/o male referred to PT s/p L MCA stroke. Pt also seeing speech as well. Pt with very weak voice, at times hx difficult to take as a result. He attempted to ambulate to clinic with 4WW but became too fatigued and required assistance  into WC. Pt now presents to PT eval in WC. He reports he primarily uses a 4WW at home, but does not ambulate much during the day (maybe ten minutes total). He reports difficulty with standing up from chairs. He must use grab bars in his bathroom due to difficulty standing up from toilet. He has difficulty with stairs, requires help getting up step to enter his home. He reports no recent falls, but that he does stumble with his 4WW when fatigued.  Pt accompanied by: self  PERTINENT HISTORY:  Via ED d/c note: "Presented 11/16/2023 to [facility] with right facial droop left gaze preference and dysarthria. CT/MRI showed small acute nonhemorrhagic left MCA distribution infarction involving the left frontal lobe. No associated mass effect. Patient did not receive TNK. CTA showed no large vessel occlusion or evidence of finding amenable to neurovascular intervention... 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 No none currently but does report arthritis pain in L shoulder limiting ability to lift/raise arm  PRECAUTIONS: Fall  RED FLAGS: None   WEIGHT BEARING RESTRICTIONS: No  FALLS: Has patient fallen in last 6 months? No but does report stumbling with fatigue  LIVING ENVIRONMENT: Lives with: lives with their family son and DIL Lives in: House/apartment, two level home but not using upstairs, stays on first  floor Stairs: 1 step to enter home, says no handrails but he has help getting up step Has following equipment at home: Walker - 4 wheeled, shower chair, and Grab bars  PLOF: Independent  PATIENT GOALS: everything: walking, balance, strength   OBJECTIVE:  Note: Objective measures were completed at Evaluation unless otherwise noted.  DIAGNOSTIC FINDINGS:  imaging via chart  MR brain 11/16/23: " IMPRESSION: 1. Small acute nonhemorrhagic left MCA distribution infarct involving the left frontal lobe. No associated mass effect. 2. Additional punctate subcentimeter acute ischemic nonhemorrhagic infarct within the contralateral right basal ganglia. 3. Underlying moderately advanced chronic microvascular ischemic disease with a few scattered remote lacunar infarcts as above. 4. Chronic occlusion of the left vertebral artery.     Electronically Signed   By: Virgia Griffins M.D.   On: 11/16/2023 23:48"  CT ANGIO HEAD NECK 11/16/23 " IMPRESSION: No large vessel occlusion or evidence of findings amenable to neurovascular intervention.   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   Multiple intracranial vascular stenoses as above. Focal short segment occlusion of an M2 inferior division branch of the right MCA with reconstitution noted.   Mild-to-moderate stenosis of the V4 segment right vertebral artery.   Emphysema (ICD10-J43.9).     Electronically Signed   By: Denny Flack M.D.   On: 11/16/2023 15:50"  CT HEAD 11/16/23: "IMPRESSION: 1. 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 2. Subtle hypoattenuation at the anterior limb of the right internal capsule. 3. 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 4. Age indeterminate infarct  in the left lentiform nucleus. 5. Moderate atrophy and white matter disease likely reflects the sequela of chronic microvascular ischemia. 6. No acute hemorrhage or mass lesion.   These results were called by telephone at the time of interpretation on 11/16/2023 at 3:13 pm to  provider Dr. Doretta Gant, who verbally acknowledged these results.     Electronically Signed   By: Audree Leas M.D.   On: 11/16/2023 15:14"  COGNITION: Overall cognitive status: Within functional limits for tasks assessed but at times difficult to fully determine due to weakness of voice and some difficulty responding to PT questions as a result   SENSATION: WFL to light touch bilat UE and bilat LE  COORDINATION: WFL rapid alt movement UE and finger chin<>target bilat  WFL rapid alt movement LE and heel>shin bilat  EDEMA:  Pt reports no swelling     POSTURE: slight rounded shoulders, forward head, and increased thoracic kyphosis   LOWER EXTREMITY MMT:    Gross bilat LE strength is 4/5, more weakness found proximal than distal   BED MOBILITY:  Not assessed  TRANSFERS: Initially min a, then pt able to complete CGA   STAIRS: Not assessed but impaired per pt report GAIT: Findings: decreased gait speed (See ), FWD flexed posture with gait, decreased bilat heel strike, more pronounced on R than L side  FUNCTIONAL TESTS:  5 times sit to stand: 44.2 sec with use UE  Timed up and go (TUG): 33 second with 4WW  10 meter walk test: 0.53 m/s with 4WW  Berg Balance Scale: deferred : deferred  PATIENT SURVEYS:  SIS-16: 54                                                                                                                              TREATMENT DATE:   Self care:  BP taken prior to treatment  Resting, seated:  158/83 mmHg HR 67 and 167/92 mmHg in standing VERBAL review of current HEP- see below activities  Physical therapy treatment session today consisted of completing  assessment of goals and administration of testing as demonstrated and documented in flow sheet, treatment, and goals section of this note. Addition treatments may be found below.   PT instructed pt in TUG: 25.42  sec (average of 3 trials; >13.5 sec indicates increased fall risk)   10 Meter Walk Test: Patient instructed to walk 10 meters (32.8 ft) as quickly and as safely as possible at their normal speed x2 and at a fast speed x2. Time measured from 2 meter mark to 8 meter mark to accommodate ramp-up and ramp-down.  Normal speed 1: 0.6 m/s Normal speed 2: 0.56 m/s Average Normal speed: 0.58 m/s Cut off scores: <0.4 m/s = household Ambulator, 0.4-0.8 m/s = limited community Ambulator, >0.8 m/s = community Ambulator, >1.2 m/s = crossing a street, <1.0 = increased fall risk MCID 0.05 m/s (small), 0.13 m/s (moderate), 0.06 m/s (significant)  (ANPTA Core Set of Outcome Measures for Adults with Neurologic Conditions, 2018)   6 Min Walk Test:  Instructed patient to ambulate as quickly and as safely as possible for 6 minutes using LRAD. Patient was allowed to take standing rest breaks without stopping the test, but if the patient required  a sitting rest break the clock would be stopped and the test would be over.  Results: 610 feet (186 meters, Avg speed 0.52 m/s) using a 4WW with CGA. Results indicate that the patient has reduced endurance with ambulation compared to age matched norms.  Age Matched Norms: 12-69 yo M: 33 F: 50, 74-79 yo M: 84 F: 471, 65-89 yo M: 417 F: 392 MDC: 58.21 meters (190.98 feet) or 50 meters (ANPTA Core Set of Outcome Measures for Adults with Neurologic Conditions, 2018) m  Pt performed 5 time sit<>stand (5xSTS): 01/15/2024= 36.46 sec with BUE Support (Still unable to rise without UE Support and some posterior lean- CGA) (>15 sec indicates increased fall risk)      PATIENT EDUCATION: Education details: Pt educated throughout session about proper posture and technique with  exercises. Improved exercise technique, movement at target joints, use of target muscles after min to mod verbal, visual, tactile cues.  Person educated: Patient Education method: Explanation, Demonstration, and Verbal cues Education comprehension: verbalized understanding and returned demonstration  HOME EXERCISE PROGRAM: Access Code: [email or web address] URL: [email or web address] Date: 12/11/2023 Prepared by: Marlynn Singer  Exercises - Seated March  - 1 x daily - 7 x weekly - 2 sets - 10 reps - Seated Long Arc Quad  - 1 x daily - 7 x weekly - 2 sets - 10 reps - 3 sec hold - Standing Knee Flexion AROM with Chair Support  - 1 x daily - 7 x weekly - 2 sets - 10 reps - Wide tandem with counter support as needed   - 1 x daily - 7 x weekly - 3 sets - 30 sec  hold  GOALS: Goals reviewed with patient? Yes  SHORT TERM GOALS: Target date: 01/18/2024  Patient will be independent in home exercise program to improve strength/mobility for better functional independence with ADLs. Baseline: 01/15/2024= Patient able to verbalize and demonstrate his seated HEP without prompting- states compliance.  Goal status: MET   LONG TERM GOALS: Target date:02/29/2024  Patient will increase SIS-16 score by at least 10 points  to demonstrate increased ease with ADLs and quality of life.  Baseline: 54 Goal status: INITIAL  2.  Patient (> 1 years old) will complete five times sit to stand test in < 15 seconds indicating an increased LE strength and improved balance. Baseline: 44.2 sec with use of BUE; 01/15/2024= 36.46 sec with BUE Support (Still unable to rise without UE Support and some posterior lean- CGA)  Goal status: PROGRESSING   3.  Patient will increase Berg Balance score by > 6 points to demonstrate decreased fall risk during functional activities Baseline: 34 Goal status: INITIAL  4.  Patient will increase 10 meter walk test to >1.60m/s as to improve gait speed for better community ambulation  and to reduce fall risk. Baseline: 0.53 m/s with 4WW; 01/15/2024= 0.58 m/s with 4WW Goal status: PROGRESSING  5.  Patient will reduce timed up and go to <11 seconds to reduce fall risk and demonstrate improved transfer/gait ability. Baseline: 33 sec with 4WW; 01/15/2024= 25.42 sec avg with 4WW Goal status: PROGRESSING  6. Patient will increase six minute walk test distance to >1000 for progression to community ambulator and improve gait ability Baseline: 310 feet using a 4WW with CGA and only completes 3:17 sec prior to requesting to sit secondary to fatigue; 01/15/2024= 610 feet in with 4WW Goal status: PROGRESSING   ASSESSMENT:  CLINICAL IMPRESSION: Patient presents with fair motivation- cooperative and responsive to all instruction  for today's progress report visit. He was retested with several functional outcome measures today and was able to demonstrate progress overall including improved functional LE strength with 5 x STS, Decreased TUG score and improved gait speed for decreased risk of falling. He also doubled his gait distance with 6 min walk and able to complete test today. Patient's condition has the potential to improve in response to therapy. Maximum improvement is yet to be obtained. The anticipated improvement is attainable and reasonable in a generally predictable time.  The pt would benefit from continued progress towards proper gait mechanics as well as improved activity tolerance to allow return to PLOF and improved overall QoL.         OBJECTIVE IMPAIRMENTS: Abnormal gait, decreased activity tolerance, decreased balance, decreased endurance, decreased mobility, difficulty walking, decreased strength, impaired UE functional use, improper body mechanics, postural dysfunction, and pain.   ACTIVITY LIMITATIONS: carrying, lifting, bending, standing, squatting, stairs, transfers, toileting, dressing, reach over head, and locomotion level  PARTICIPATION LIMITATIONS: meal prep,  cleaning, laundry, driving, shopping, community activity, and yard work  PERSONAL FACTORS: Age, Fitness, and 3+ comorbidities: Per chart PMH significant for arthritis, depression, DMII, gout, HTN, HLD, ischemic cardiomyopathy, MI?2012, vision loss R eye are also affecting patient's functional outcome.   REHAB POTENTIAL: Good  CLINICAL DECISION MAKING: Evolving/moderate complexity  EVALUATION COMPLEXITY: Moderate  PLAN:  PT FREQUENCY: 1-2x/week  PT DURATION: 12 weeks  PLANNED INTERVENTIONS: [id number]- PT Re-evaluation, 97750- Physical Performance Testing, 97110-Therapeutic exercises, 97530- Therapeutic activity, [id number]- Neuromuscular re-education, 97535- Self Care, [id number]- Manual therapy, [id number]- Gait training, [phone]- Orthotic Initial, [phone]- Orthotic/Prosthetic subsequent, [phone]- Canalith repositioning, Patient/Family education, Balance training, Stair training, Taping, Joint mobilization, Spinal mobilization, Vestibular training, DME instructions, Wheelchair mobility training, Cryotherapy, and Moist heat  PLAN FOR NEXT SESSION:    Progress gait with least restrict assistive device  Improve dynamic balance including narrowed BOS Progress overall LE strength  Progress HEP as indicated.   Ossie Blend, PT Physical Therapist - [facility]  4:52 PM 01/15/24

## 2024-01-17 ENCOUNTER — Ambulatory Visit: Admitting: Speech Pathology

## 2024-01-17 ENCOUNTER — Encounter: Payer: Self-pay | Admitting: Physical Therapy

## 2024-01-17 ENCOUNTER — Ambulatory Visit: Admitting: Physical Therapy

## 2024-01-17 DIAGNOSIS — M6281 Muscle weakness (generalized): Secondary | ICD-10-CM | POA: Diagnosis not present

## 2024-01-17 DIAGNOSIS — R2681 Unsteadiness on feet: Secondary | ICD-10-CM

## 2024-01-17 DIAGNOSIS — R278 Other lack of coordination: Secondary | ICD-10-CM

## 2024-01-17 DIAGNOSIS — R262 Difficulty in walking, not elsewhere classified: Secondary | ICD-10-CM

## 2024-01-17 NOTE — Therapy (Signed)
 OUTPATIENT PHYSICAL THERAPY NEURO TREATMENT   Patient Name: [name] [name] MRN: [id number] DOB:1941/01/18, 83 y.o., male Today's Date: 01/17/2024   PCP: Al Hover, MD REFERRING PROVIDER:   Sterling Eisenmenger, PA-C    END OF SESSION:  PT End of Session - 01/17/24 1153     Visit Number 11    Number of Visits 25    Date for PT Re-Evaluation 02/29/24    Progress Note Due on Visit 20    PT Start Time 1147    PT Stop Time 1230    PT Time Calculation (min) 43 min    Equipment Utilized During Treatment Gait belt    Activity Tolerance Patient tolerated treatment well    Behavior During Therapy Flat affect                 Past Medical History:  Diagnosis Date   Arthritis    Depression    Diabetes mellitus without complication (HCC)    Gout    Hyperlipidemia    Hypertension    Ischemic cardiomyopathy    History reviewed. No pertinent surgical history. 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   Ischemic cardiomyopathy 06/12/2018   Thrombocytosis 04/16/2016   Adjustment reaction with prolonged depressive reaction 01/22/2014   Visual loss, right eye 07/30/2012   Type 2 diabetes mellitus (HCC) 06/16/2011   Coronary artery disease 06/16/2011   Hypertension associated with diabetes (HCC) 06/16/2011    ONSET DATE: 11/16/2023  REFERRING DIAG: N82.956 (ICD-10-CM) - Acute ischemic left MCA stroke (HCC)   THERAPY DIAG:  Muscle weakness (generalized)  Difficulty in walking, not elsewhere classified  Other lack of coordination  Unsteadiness on feet  Rationale for Evaluation and Treatment: Rehabilitation  SUBJECTIVE:                                                                                                                                                                                              SUBJECTIVE STATEMENT:   Pt reports no new concerns today. Has been monitoring his BP. No falls.    From eval: Pt is a pleasant 83 y/o male referred to PT s/p L MCA stroke. Pt also seeing speech as well. Pt with very weak voice, at times hx difficult to take as a result. He attempted to ambulate to clinic with 4WW but became too fatigued and required assistance into WC. Pt now presents to PT eval in WC. He reports he primarily uses a  4WW at home, but does not ambulate much during the day (maybe ten minutes total). He reports difficulty with standing up from chairs. He must use grab bars in his bathroom due to difficulty standing up from toilet. He has difficulty with stairs, requires help getting up step to enter his home. He reports no recent falls, but that he does stumble with his 4WW when fatigued.  Pt accompanied by: self  PERTINENT HISTORY:  Via ED d/c note: Presented 11/16/2023 to [facility] with right facial droop left gaze preference and dysarthria. CT/MRI showed small acute nonhemorrhagic left MCA distribution infarction involving the left frontal lobe. No associated mass effect. Patient did not receive TNK. CTA showed no large vessel occlusion or evidence of finding amenable to neurovascular intervention...   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 No none currently but does report arthritis pain in L shoulder limiting ability to lift/raise arm  PRECAUTIONS: Fall  RED FLAGS: None   WEIGHT BEARING RESTRICTIONS: No  FALLS: Has patient fallen in last 6 months? No but does report stumbling with fatigue  LIVING ENVIRONMENT: Lives with: lives with their family son and DIL Lives in: House/apartment, two level home but not using upstairs, stays on first floor Stairs: 1 step to enter home, says no handrails but he has help getting up  step Has following equipment at home: Walker - 4 wheeled, shower chair, and Grab bars  PLOF: Independent  PATIENT GOALS: everything: walking, balance, strength   OBJECTIVE:  Note: Objective measures were completed at Evaluation unless otherwise noted.  DIAGNOSTIC FINDINGS:  imaging via chart  MR brain 11/16/23:  IMPRESSION: 1. Small acute nonhemorrhagic left MCA distribution infarct involving the left frontal lobe. No associated mass effect. 2. Additional punctate subcentimeter acute ischemic nonhemorrhagic infarct within the contralateral right basal ganglia. 3. Underlying moderately advanced chronic microvascular ischemic disease with a few scattered remote lacunar infarcts as above. 4. Chronic occlusion of the left vertebral artery.     Electronically Signed   By: Virgia Griffins M.D.   On: 11/16/2023 23:48  CT ANGIO HEAD NECK 11/16/23  IMPRESSION: No large vessel occlusion or evidence of findings amenable to neurovascular intervention.   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   Multiple intracranial vascular stenoses as above. Focal short segment occlusion of an M2 inferior division branch of the right MCA with reconstitution noted.   Mild-to-moderate stenosis of the V4 segment right vertebral artery.   Emphysema (ICD10-J43.9).     Electronically Signed   By: Denny Flack M.D.   On: 11/16/2023 15:50  CT HEAD 11/16/23: IMPRESSION: 1. 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 2. Subtle hypoattenuation at the anterior limb of the right internal capsule. 3. 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 4. Age indeterminate infarct in the left lentiform nucleus. 5. Moderate atrophy and white matter disease  likely reflects the sequela of chronic microvascular ischemia. 6. No acute hemorrhage or mass lesion.   These results were called by telephone at the time of interpretation on 11/16/2023 at 3:13 pm to provider Dr. Doretta Gant, who verbally acknowledged these results.     Electronically Signed  By: Audree Leas M.D.   On: 11/16/2023 15:14  COGNITION: Overall cognitive status: Within functional limits for tasks assessed but at times difficult to fully determine due to weakness of voice and some difficulty responding to PT questions as a result   SENSATION: WFL to light touch bilat UE and bilat LE  COORDINATION: WFL rapid alt movement UE and finger chin<>target bilat  WFL rapid alt movement LE and heel>shin bilat  EDEMA:  Pt reports no swelling     POSTURE: slight rounded shoulders, forward head, and increased thoracic kyphosis   LOWER EXTREMITY MMT:    Gross bilat LE strength is 4/5, more weakness found proximal than distal   BED MOBILITY:  Not assessed  TRANSFERS: Initially min a, then pt able to complete CGA   STAIRS: Not assessed but impaired per pt report GAIT: Findings: decreased gait speed (See ), FWD flexed posture with gait, decreased bilat heel strike, more pronounced on R than L side  FUNCTIONAL TESTS:  5 times sit to stand: 44.2 sec with use UE  Timed up and go (TUG): 33 second with 4WW  10 meter walk test: 0.53 m/s with 4WW  Berg Balance Scale: deferred : deferred  PATIENT SURVEYS:  SIS-16: 54                                                                                                                              TREATMENT DATE:    There.ex:   Nu-step L3 for 5 min BUE and LE use for warm up. Not billed.    Gait with 4 WW: 1x150' no resistance. Additional 2x150' with 2.5# AW's donned CGA. VC's for foot clearance and keeping 4WW closer to BOS for reduced anterior LOB. Working on gait endurance.    Neuro Re-Ed:  STS 2x5  needing BUE support to stand with VC's for anterior trunk lean. Ball toss once standing forwards/R/L outside BOS for static standing balance.   BP taken post STS and gait: 143/84 mm Hg, HR: 68 BPM    Airex pad step ups with SUE support on bar: alternating feet, 2x8/side, CGA. Progressed with 2x8 lateral step up and overs CGA and BUE support. Seated rest after first sets.     PATIENT EDUCATION: Education details: Pt educated throughout session about proper posture and technique with exercises. Improved exercise technique, movement at target joints, use of target muscles after min to mod verbal, visual, tactile cues.  Person educated: Patient Education method: Explanation, Demonstration, and Verbal cues Education comprehension: verbalized understanding and returned demonstration  HOME EXERCISE PROGRAM: Access Code: [email or web address] URL: [email or web address] Date: 12/11/2023 Prepared by: Marlynn Singer  Exercises - Seated March  - 1 x daily - 7 x weekly - 2 sets - 10 reps - Seated Long Arc Quad  - 1 x daily - 7 x weekly - 2 sets - 10 reps - 3 sec hold - Standing Knee Flexion AROM with Chair Support  - 1 x  daily - 7 x weekly - 2 sets - 10 reps - Wide tandem with counter support as needed   - 1 x daily - 7 x weekly - 3 sets - 30 sec  hold  GOALS: Goals reviewed with patient? Yes  SHORT TERM GOALS: Target date: 01/18/2024  Patient will be independent in home exercise program to improve strength/mobility for better functional independence with ADLs. Baseline: 01/15/2024= Patient able to verbalize and demonstrate his seated HEP without prompting- states compliance.  Goal status: MET   LONG TERM GOALS: Target date:02/29/2024  Patient will increase SIS-16 score by at least 10 points  to demonstrate increased ease with ADLs and quality of life.  Baseline: 54 Goal status: INITIAL  2.  Patient (> 64 years old) will complete five times sit to stand test in < 15 seconds indicating an  increased LE strength and improved balance. Baseline: 44.2 sec with use of BUE; 01/15/2024= 36.46 sec with BUE Support (Still unable to rise without UE Support and some posterior lean- CGA)  Goal status: PROGRESSING   3.  Patient will increase Berg Balance score by > 6 points to demonstrate decreased fall risk during functional activities Baseline: 34 Goal status: INITIAL  4.  Patient will increase 10 meter walk test to >1.53m/s as to improve gait speed for better community ambulation and to reduce fall risk. Baseline: 0.53 m/s with 4WW; 01/15/2024= 0.58 m/s with 4WW Goal status: PROGRESSING  5.  Patient will reduce timed up and go to <11 seconds to reduce fall risk and demonstrate improved transfer/gait ability. Baseline: 33 sec with 4WW; 01/15/2024= 25.42 sec avg with 4WW Goal status: PROGRESSING  6. Patient will increase six minute walk test distance to >1000 for progression to community ambulator and improve gait ability Baseline: 310 feet using a 4WW with CGA and only completes 3:17 sec prior to requesting to sit secondary to fatigue; 01/15/2024= 610 feet in with 4WW Goal status: PROGRESSING   ASSESSMENT:  CLINICAL IMPRESSION: Continuing PT POC working on dynamic balance and LE strength. Pt able to progress his gait with AW's for LE strength and endurance with good tolerance. Pt displays some difficulties with SLS activities and foot clearance needing regular SUE to BUE support.Pt does need regular seated rest breaks due to LE fatigue but remained motivated throughout session. Pt would benefit from continued progress towards proper gait mechanics as well as improved activity tolerance to allow return to PLOF and improved overall QoL.   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   REHAB POTENTIAL: Good  CLINICAL DECISION MAKING: Evolving/moderate complexity  EVALUATION COMPLEXITY: Moderate  PLAN:  PT FREQUENCY: 1-2x/week  PT DURATION: 12 weeks  PLANNED INTERVENTIONS: [id number]- PT Re-evaluation, 97750- Physical Performance Testing, 97110-Therapeutic exercises, 97530- Therapeutic activity, [id number]- Neuromuscular re-education, 97535- Self Care, [id number]- Manual therapy, [id number]- Gait training, [phone]- Orthotic Initial, [phone]- Orthotic/Prosthetic subsequent, [phone]- Canalith repositioning, Patient/Family education, Balance training, Stair training, Taping, Joint mobilization, Spinal mobilization, Vestibular training, DME instructions, Wheelchair mobility training, Cryotherapy, and Moist heat  PLAN FOR NEXT SESSION:    Progress gait with least restrict assistive device  Improve dynamic balance including narrowed BOS Progress overall LE strength  Progress HEP as indicated.     Marc Senior. Fairly IV, PT, DPT Physical Therapist- [place]  [facility]  1:13 PM 01/17/24

## 2024-01-23 ENCOUNTER — Ambulatory Visit: Admitting: Physical Therapy

## 2024-01-23 ENCOUNTER — Encounter: Admitting: Speech Pathology

## 2024-01-23 DIAGNOSIS — R278 Other lack of coordination: Secondary | ICD-10-CM

## 2024-01-23 DIAGNOSIS — R262 Difficulty in walking, not elsewhere classified: Secondary | ICD-10-CM

## 2024-01-23 DIAGNOSIS — M6281 Muscle weakness (generalized): Secondary | ICD-10-CM

## 2024-01-23 DIAGNOSIS — R2681 Unsteadiness on feet: Secondary | ICD-10-CM

## 2024-01-23 NOTE — Therapy (Signed)
 OUTPATIENT PHYSICAL THERAPY NEURO TREATMENT   Patient Name: [name] [name] MRN: [id number] DOB:[birth year], 83 y.o., male Today's Date: 01/23/2024   PCP: Al Hover, MD REFERRING PROVIDER:   Sterling Eisenmenger, PA-C    END OF SESSION:  PT End of Session - 01/23/24 0846     Visit Number 12    Number of Visits 25    Date for PT Re-Evaluation 02/29/24    Progress Note Due on Visit 20    PT Start Time 0847    PT Stop Time 0927    PT Time Calculation (min) 40 min    Equipment Utilized During Treatment Gait belt    Activity Tolerance Patient tolerated treatment well    Behavior During Therapy Flat affect              Past Medical History:  Diagnosis Date   Arthritis    Depression    Diabetes mellitus without complication (HCC)    Gout    Hyperlipidemia    Hypertension    Ischemic cardiomyopathy    No past surgical history on file. 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   Ischemic cardiomyopathy 06/12/2018   Thrombocytosis 04/16/2016   Adjustment reaction with prolonged depressive reaction 01/22/2014   Visual loss, right eye 07/30/2012   Type 2 diabetes mellitus (HCC) 06/16/2011   Coronary artery disease 06/16/2011   Hypertension associated with diabetes (HCC) 06/16/2011    ONSET DATE: 11/16/2023  REFERRING DIAG: N82.956 (ICD-10-CM) - Acute ischemic left MCA stroke (HCC)   THERAPY DIAG:  Muscle weakness (generalized)  Difficulty in walking, not elsewhere classified  Other lack of coordination  Unsteadiness on feet  Rationale for Evaluation and Treatment: Rehabilitation  SUBJECTIVE:                                                                                                                                                                                             SUBJECTIVE STATEMENT:    Pt reports no new concerns today. Has been monitoring his BP. No falls.  No pain reported     From eval: Pt is a pleasant 83 y/o male referred to PT s/p L MCA stroke. Pt also seeing speech as well. Pt with very weak voice, at times hx difficult to take as a result. He attempted to ambulate to clinic with 4WW but became too fatigued and required assistance into WC. Pt now presents to PT eval in WC. He reports he primarily  uses a 4WW at home, but does not ambulate much during the day (maybe ten minutes total). He reports difficulty with standing up from chairs. He must use grab bars in his bathroom due to difficulty standing up from toilet. He has difficulty with stairs, requires help getting up step to enter his home. He reports no recent falls, but that he does stumble with his 4WW when fatigued.  Pt accompanied by: self  PERTINENT HISTORY:  Via ED d/c note: Presented 11/16/2023 to [facility] with right facial droop left gaze preference and dysarthria. CT/MRI showed small acute nonhemorrhagic left MCA distribution infarction involving the left frontal lobe. No associated mass effect. Patient did not receive TNK. CTA showed no large vessel occlusion or evidence of finding amenable to neurovascular intervention...   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 No none currently but does report arthritis pain in L shoulder limiting ability to lift/raise arm  PRECAUTIONS: Fall  RED FLAGS: None   WEIGHT BEARING RESTRICTIONS: No  FALLS: Has patient fallen in last 6 months? No but does report stumbling with fatigue  LIVING ENVIRONMENT: Lives with: lives with their family son and DIL Lives in: House/apartment, two level home but not using upstairs, stays on first floor Stairs: 1 step to enter home, says no handrails but he has help getting up  step Has following equipment at home: Walker - 4 wheeled, shower chair, and Grab bars  PLOF: Independent  PATIENT GOALS: everything: walking, balance, strength   OBJECTIVE:  Note: Objective measures were completed at Evaluation unless otherwise noted.  DIAGNOSTIC FINDINGS:  imaging via chart  MR brain 11/16/23:  IMPRESSION: 1. Small acute nonhemorrhagic left MCA distribution infarct involving the left frontal lobe. No associated mass effect. 2. Additional punctate subcentimeter acute ischemic nonhemorrhagic infarct within the contralateral right basal ganglia. 3. Underlying moderately advanced chronic microvascular ischemic disease with a few scattered remote lacunar infarcts as above. 4. Chronic occlusion of the left vertebral artery.     Electronically Signed   By: Virgia Griffins M.D.   On: 11/16/2023 23:48  CT ANGIO HEAD NECK 11/16/23  IMPRESSION: No large vessel occlusion or evidence of findings amenable to neurovascular intervention.   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   Multiple intracranial vascular stenoses as above. Focal short segment occlusion of an M2 inferior division branch of the right MCA with reconstitution noted.   Mild-to-moderate stenosis of the V4 segment right vertebral artery.   Emphysema (ICD10-J43.9).     Electronically Signed   By: Denny Flack M.D.   On: 11/16/2023 15:50  CT HEAD 11/16/23: IMPRESSION: 1. 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 2. Subtle hypoattenuation at the anterior limb of the right internal capsule. 3. 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 4. Age indeterminate infarct in the left lentiform nucleus. 5. Moderate atrophy and white matter disease  likely reflects the sequela of chronic microvascular ischemia. 6. No acute hemorrhage or mass lesion.   These results were called by telephone at the time of interpretation on 11/16/2023 at 3:13 pm to provider Dr. Doretta Gant, who verbally acknowledged these results.     Electronically Signed  By: Audree Leas M.D.   On: 11/16/2023 15:14  COGNITION: Overall cognitive status: Within functional limits for tasks assessed but at times difficult to fully determine due to weakness of voice and some difficulty responding to PT questions as a result   SENSATION: WFL to light touch bilat UE and bilat LE  COORDINATION: WFL rapid alt movement UE and finger chin<>target bilat  WFL rapid alt movement LE and heel>shin bilat  EDEMA:  Pt reports no swelling     POSTURE: slight rounded shoulders, forward head, and increased thoracic kyphosis   LOWER EXTREMITY MMT:    Gross bilat LE strength is 4/5, more weakness found proximal than distal   BED MOBILITY:  Not assessed  TRANSFERS: Initially min a, then pt able to complete CGA   STAIRS: Not assessed but impaired per pt report GAIT: Findings: decreased gait speed (See ), FWD flexed posture with gait, decreased bilat heel strike, more pronounced on R than L side  FUNCTIONAL TESTS:  5 times sit to stand: 44.2 sec with use UE  Timed up and go (TUG): 33 second with 4WW  10 meter walk test: 0.53 m/s with 4WW  Berg Balance Scale: deferred : deferred  PATIENT SURVEYS:  SIS-16: 54                                                                                                                              TREATMENT DATE:   TA.  Sit<>stand 2 x 5 with UE support from arm rest.  Gait with 4WW x317ft VS assessed upon completion. 151/81(99) HR 67.   Gait with HHA x 68ft and no UE support x 26ft with turn at 64ft. CGA for safety with mild R hip weakness.  Forward/reverse gait with no AD 15ft x 3.  Side stepping at rail with  light UE support 5 x 5 bil.  Sit<>stand with airex pad in seat and BUE to push from thighs 2 x 3 with therapeutic rest break between bouts.   Mild R hip instability and foot drag with fatigue on the RLE, but improved with rest.    There.ex:    Seated  hip abduction RTB x 12  Hip flexion RTB x 12  Isometric hip adduction x 12 with 3 sec hold  LAQ x 12 bil  Cues for full ROM and decreased speed of eccentric movement with hip abduction and flextion   Gait belt in place and CGA provided for safety for all standing interventions, unless otherwise noted.   PATIENT EDUCATION: Education details: Pt educated throughout session about proper posture and technique with exercises. Improved exercise technique, movement at target joints, use of target muscles after min to mod verbal, visual, tactile cues.  Person educated: Patient Education method: Explanation, Demonstration, and Verbal cues Education comprehension: verbalized understanding and returned demonstration  HOME EXERCISE PROGRAM: Access Code: [email or web address] URL: [email or web address] Date: 12/11/2023 Prepared by: Marlynn Singer  Exercises - Seated March  - 1  x daily - 7 x weekly - 2 sets - 10 reps - Seated Long Arc Quad  - 1 x daily - 7 x weekly - 2 sets - 10 reps - 3 sec hold - Standing Knee Flexion AROM with Chair Support  - 1 x daily - 7 x weekly - 2 sets - 10 reps - Wide tandem with counter support as needed   - 1 x daily - 7 x weekly - 3 sets - 30 sec  hold  GOALS: Goals reviewed with patient? Yes  SHORT TERM GOALS: Target date: 01/18/2024  Patient will be independent in home exercise program to improve strength/mobility for better functional independence with ADLs. Baseline: 01/15/2024= Patient able to verbalize and demonstrate his seated HEP without prompting- states compliance.  Goal status: MET   LONG TERM GOALS: Target date:02/29/2024  Patient will increase SIS-16 score by at least 10 points  to demonstrate  increased ease with ADLs and quality of life.  Baseline: 54 Goal status: INITIAL  2.  Patient (> 24 years old) will complete five times sit to stand test in < 15 seconds indicating an increased LE strength and improved balance. Baseline: 44.2 sec with use of BUE; 01/15/2024= 36.46 sec with BUE Support (Still unable to rise without UE Support and some posterior lean- CGA)  Goal status: PROGRESSING   3.  Patient will increase Berg Balance score by > 6 points to demonstrate decreased fall risk during functional activities Baseline: 34 Goal status: INITIAL  4.  Patient will increase 10 meter walk test to >1.61m/s as to improve gait speed for better community ambulation and to reduce fall risk. Baseline: 0.53 m/s with 4WW; 01/15/2024= 0.58 m/s with 4WW Goal status: PROGRESSING  5.  Patient will reduce timed up and go to <11 seconds to reduce fall risk and demonstrate improved transfer/gait ability. Baseline: 33 sec with 4WW; 01/15/2024= 25.42 sec avg with 4WW Goal status: PROGRESSING  6. Patient will increase six minute walk test distance to >1000 for progression to community ambulator and improve gait ability Baseline: 310 feet using a 4WW with CGA and only completes 3:17 sec prior to requesting to sit secondary to fatigue; 01/15/2024= 610 feet in with 4WW Goal status: PROGRESSING   ASSESSMENT:  CLINICAL IMPRESSION: Continuing PT POC working on dynamic balance and LE strength. Pt tolerated gait without AD well on this day, but mildly limited by hip instability on the R side with fatigue. Pt enjoyed performed gait without AD, but fearful initially. Will need to increase BLE strength and power to improve function and safety in house hold mobility with increased independence. Pt would benefit from continued PT to progress towards proper gait mechanics as well as improved activity tolerance to allow return to PLOF and improved overall QoL.   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   REHAB POTENTIAL: Good  CLINICAL DECISION MAKING: Evolving/moderate complexity  EVALUATION COMPLEXITY: Moderate  PLAN:  PT FREQUENCY: 1-2x/week  PT DURATION: 12 weeks  PLANNED INTERVENTIONS: 97164- PT Re-evaluation, 97750- Physical Performance Testing, 97110-Therapeutic exercises, 97530- Therapeutic activity, [id number]- Neuromuscular re-education, 97535- Self Care, [id number]- Manual therapy, [id number]- Gait training, [id number]- Orthotic Initial, [id number]- Orthotic/Prosthetic  subsequent, [phone]- Canalith repositioning, Patient/Family education, Balance training, Stair training, Taping, Joint mobilization, Spinal mobilization, Vestibular training, DME instructions, Wheelchair mobility training, Cryotherapy, and Moist heat  PLAN FOR NEXT SESSION:    Progress gait with least restrict assistive device  Improve dynamic balance including narrowed BOS Progress overall LE strength  Progress HEP as indicated.    Aurora Lees PT, DPT  Physical Therapist - [place]  [facility]  8:47 AM 01/23/24

## 2024-01-25 ENCOUNTER — Ambulatory Visit: Admitting: Physical Therapy

## 2024-01-25 DIAGNOSIS — R2681 Unsteadiness on feet: Secondary | ICD-10-CM

## 2024-01-25 DIAGNOSIS — R471 Dysarthria and anarthria: Secondary | ICD-10-CM

## 2024-01-25 DIAGNOSIS — M6281 Muscle weakness (generalized): Secondary | ICD-10-CM | POA: Diagnosis not present

## 2024-01-25 DIAGNOSIS — R262 Difficulty in walking, not elsewhere classified: Secondary | ICD-10-CM

## 2024-01-25 DIAGNOSIS — R41841 Cognitive communication deficit: Secondary | ICD-10-CM

## 2024-01-25 DIAGNOSIS — R278 Other lack of coordination: Secondary | ICD-10-CM

## 2024-01-25 NOTE — Therapy (Signed)
 OUTPATIENT PHYSICAL THERAPY NEURO TREATMENT   Patient Name: [name] [name] MRN: [id number] DOB:January 16, 1941, 83 y.o., male Today's Date: 01/25/2024   PCP: Al Hover, MD REFERRING PROVIDER:   Sterling Eisenmenger, PA-C    END OF SESSION:  PT End of Session - 01/25/24 0934     Visit Number 13    Number of Visits 25    Date for PT Re-Evaluation 02/29/24    Progress Note Due on Visit 20    PT Start Time 0934    PT Stop Time 1015    PT Time Calculation (min) 41 min    Equipment Utilized During Treatment Gait belt    Activity Tolerance Patient tolerated treatment well    Behavior During Therapy Flat affect              Past Medical History:  Diagnosis Date   Arthritis    Depression    Diabetes mellitus without complication (HCC)    Gout    Hyperlipidemia    Hypertension    Ischemic cardiomyopathy    No past surgical history on file. 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   Ischemic cardiomyopathy 06/12/2018   Thrombocytosis 04/16/2016   Adjustment reaction with prolonged depressive reaction 01/22/2014   Visual loss, right eye 07/30/2012   Type 2 diabetes mellitus (HCC) 06/16/2011   Coronary artery disease 06/16/2011   Hypertension associated with diabetes (HCC) 06/16/2011    ONSET DATE: 11/16/2023  REFERRING DIAG: W09.811 (ICD-10-CM) - Acute ischemic left MCA stroke (HCC)   THERAPY DIAG:  Muscle weakness (generalized)  Difficulty in walking, not elsewhere classified  Other lack of coordination  Unsteadiness on feet  Dysarthria and anarthria  Cognitive communication deficit  Rationale for Evaluation and Treatment: Rehabilitation  SUBJECTIVE:                                                                                                                                                                                              SUBJECTIVE STATEMENT:   Pt reports no new concerns today. Has been monitoring his BP. No falls.  No pain reported     From eval: Pt is a pleasant 83 y/o male referred to PT s/p L MCA stroke. Pt also seeing speech as well. Pt with very weak voice, at times hx difficult to take as a result. He attempted to ambulate to clinic with 4WW but became too fatigued and required assistance into WC. Pt now presents to  PT eval in WC. He reports he primarily uses a 4WW at home, but does not ambulate much during the day (maybe ten minutes total). He reports difficulty with standing up from chairs. He must use grab bars in his bathroom due to difficulty standing up from toilet. He has difficulty with stairs, requires help getting up step to enter his home. He reports no recent falls, but that he does stumble with his 4WW when fatigued.  Pt accompanied by: self  PERTINENT HISTORY:  Via ED d/c note: Presented 11/16/2023 to [facility] with right facial droop left gaze preference and dysarthria. CT/MRI showed small acute nonhemorrhagic left MCA distribution infarction involving the left frontal lobe. No associated mass effect. Patient did not receive TNK. CTA showed no large vessel occlusion or evidence of finding amenable to neurovascular intervention...   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 No none currently but does report arthritis pain in L shoulder limiting ability to lift/raise arm  PRECAUTIONS: Fall  RED FLAGS: None   WEIGHT BEARING RESTRICTIONS: No  FALLS: Has patient fallen in last 6 months? No but does report stumbling with fatigue  LIVING ENVIRONMENT: Lives with: lives with their family son and DIL Lives in: House/apartment, two level home but not using upstairs, stays on first floor Stairs: 1 step to  enter home, says no handrails but he has help getting up step Has following equipment at home: Walker - 4 wheeled, shower chair, and Grab bars  PLOF: Independent  PATIENT GOALS: everything: walking, balance, strength   OBJECTIVE:  Note: Objective measures were completed at Evaluation unless otherwise noted.  DIAGNOSTIC FINDINGS:  imaging via chart  MR brain 11/16/23:  IMPRESSION: 1. Small acute nonhemorrhagic left MCA distribution infarct involving the left frontal lobe. No associated mass effect. 2. Additional punctate subcentimeter acute ischemic nonhemorrhagic infarct within the contralateral right basal ganglia. 3. Underlying moderately advanced chronic microvascular ischemic disease with a few scattered remote lacunar infarcts as above. 4. Chronic occlusion of the left vertebral artery.     Electronically Signed   By: Virgia Griffins M.D.   On: 11/16/2023 23:48  CT ANGIO HEAD NECK 11/16/23  IMPRESSION: No large vessel occlusion or evidence of findings amenable to neurovascular intervention.   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   Multiple intracranial vascular stenoses as above. Focal short segment occlusion of an M2 inferior division branch of the right MCA with reconstitution noted.   Mild-to-moderate stenosis of the V4 segment right vertebral artery.   Emphysema (ICD10-J43.9).     Electronically Signed   By: Denny Flack M.D.   On: 11/16/2023 15:50  CT HEAD 11/16/23: IMPRESSION: 1. 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 2. Subtle hypoattenuation at the anterior limb of the right internal capsule. 3. 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 4. Age indeterminate infarct in the left lentiform  nucleus. 5. Moderate atrophy and white matter disease likely reflects the sequela of chronic microvascular ischemia. 6. No acute hemorrhage or mass lesion.   These results were called by telephone at the time of interpretation on 11/16/2023 at 3:13 pm to provider Dr. Doretta Gant, who verbally acknowledged  these results.     Electronically Signed   By: Audree Leas M.D.   On: 11/16/2023 15:14  COGNITION: Overall cognitive status: Within functional limits for tasks assessed but at times difficult to fully determine due to weakness of voice and some difficulty responding to PT questions as a result   SENSATION: WFL to light touch bilat UE and bilat LE  COORDINATION: WFL rapid alt movement UE and finger chin<>target bilat  WFL rapid alt movement LE and heel>shin bilat  EDEMA:  Pt reports no swelling     POSTURE: slight rounded shoulders, forward head, and increased thoracic kyphosis   LOWER EXTREMITY MMT:    Gross bilat LE strength is 4/5, more weakness found proximal than distal   BED MOBILITY:  Not assessed  TRANSFERS: Initially min a, then pt able to complete CGA   STAIRS: Not assessed but impaired per pt report GAIT: Findings: decreased gait speed (See ), FWD flexed posture with gait, decreased bilat heel strike, more pronounced on R than L side  FUNCTIONAL TESTS:  5 times sit to stand: 44.2 sec with use UE  Timed up and go (TUG): 33 second with 4WW  10 meter walk test: 0.53 m/s with 4WW  Berg Balance Scale: deferred : deferred  PATIENT SURVEYS:  SIS-16: 54                                                                                                                              TREATMENT DATE:   TA.  Sit<>stand 2 x 5 with UE support from arm rest.  Gait with 4WW and 2.5 # 2x333ft with AW + 111ft cues for improved hip/knee flexion and R side heel contact with fatigue on each bout. Therapeutic rest break between bouts due to BLE fatigue and  mild SOB.    There.ex:    Seated  hip abduction RTB 2x 12  Hip flexion RTB 2x 12 with 2.5 # AW Isometric hip adduction 2 x 12 with 3 sec hold  LAQ 2x 12 bil with 2.5# AW   Seated ankle PF with 2.5 # AW Cues for full ROM and decreased speed of eccentric movement with hip abduction and flextion   Gait belt in place and CGA provided for safety for all standing interventions, unless otherwise noted.   PATIENT EDUCATION: Education details: Pt educated throughout session about proper posture and technique with exercises. Improved exercise technique, movement at target joints, use of target muscles after min to mod verbal, visual, tactile cues.  Person educated: Patient Education method: Explanation, Demonstration, and Verbal cues Education comprehension: verbalized understanding and returned demonstration  HOME EXERCISE PROGRAM: Access Code: [email or web address] URL: [email or web address] Date: 12/11/2023 Prepared by: Marlynn Singer  Exercises - Seated March  - 1 x daily - 7 x weekly - 2 sets - 10 reps - Seated Long Arc Quad  - 1 x daily - 7 x weekly - 2 sets - 10 reps - 3 sec  hold - Standing Knee Flexion AROM with Chair Support  - 1 x daily - 7 x weekly - 2 sets - 10 reps - Wide tandem with counter support as needed   - 1 x daily - 7 x weekly - 3 sets - 30 sec  hold  GOALS: Goals reviewed with patient? Yes  SHORT TERM GOALS: Target date: 01/18/2024  Patient will be independent in home exercise program to improve strength/mobility for better functional independence with ADLs. Baseline: 01/15/2024= Patient able to verbalize and demonstrate his seated HEP without prompting- states compliance.  Goal status: MET   LONG TERM GOALS: Target date:02/29/2024  Patient will increase SIS-16 score by at least 10 points  to demonstrate increased ease with ADLs and quality of life.  Baseline: 54 Goal status: INITIAL  2.  Patient (> 89 years old) will complete five times sit to stand test  in < 15 seconds indicating an increased LE strength and improved balance. Baseline: 44.2 sec with use of BUE; 01/15/2024= 36.46 sec with BUE Support (Still unable to rise without UE Support and some posterior lean- CGA)  Goal status: PROGRESSING   3.  Patient will increase Berg Balance score by > 6 points to demonstrate decreased fall risk during functional activities Baseline: 34 Goal status: INITIAL  4.  Patient will increase 10 meter walk test to >1.109m/s as to improve gait speed for better community ambulation and to reduce fall risk. Baseline: 0.53 m/s with 4WW; 01/15/2024= 0.58 m/s with 4WW Goal status: PROGRESSING  5.  Patient will reduce timed up and go to <11 seconds to reduce fall risk and demonstrate improved transfer/gait ability. Baseline: 33 sec with 4WW; 01/15/2024= 25.42 sec avg with 4WW Goal status: PROGRESSING  6. Patient will increase six minute walk test distance to >1000 for progression to community ambulator and improve gait ability Baseline: 310 feet using a 4WW with CGA and only completes 3:17 sec prior to requesting to sit secondary to fatigue; 01/15/2024= 610 feet in with 4WW Goal status: PROGRESSING   ASSESSMENT:  CLINICAL IMPRESSION: Continuing PT POC working on dynamic balance and LE strength. Pt tolerated increased distance with weighted gait on this day. but mild limited by hip instability and foot drag with fatigue,  on the R side with fatigue. Also focused improved BLE strength and power, as pt Will need to increase BLE strength and power to improve function and safety in house hold mobility with increased independence. Pt would benefit from continued PT to progress towards proper gait mechanics as well as improved activity tolerance to allow return to PLOF and improved overall QoL.   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   REHAB POTENTIAL: Good  CLINICAL DECISION MAKING: Evolving/moderate complexity  EVALUATION COMPLEXITY: Moderate  PLAN:  PT FREQUENCY: 1-2x/week  PT DURATION: 12 weeks  PLANNED INTERVENTIONS: 97164- PT Re-evaluation, 97750- Physical Performance Testing, 97110-Therapeutic exercises, 97530- Therapeutic activity, 97112- Neuromuscular re-education, 97535- Self Care, 62130- Manual therapy, [phone]- Gait training, [phone]- Orthotic Initial, [phone]- Orthotic/Prosthetic subsequent, [phone]- Canalith repositioning, Patient/Family education, Balance training, Stair training, Taping, Joint mobilization, Spinal mobilization, Vestibular training, DME instructions, Wheelchair mobility training, Cryotherapy, and Moist heat  PLAN  FOR NEXT SESSION:    Progress gait with least restrict assistive device  Improve dynamic balance including narrowed BOS Progress overall LE strength  Progress HEP as indicated.    Aurora Lees PT, DPT  Physical Therapist - [place]  [facility]  9:35 AM 01/25/24

## 2024-01-28 ENCOUNTER — Other Ambulatory Visit: Payer: Self-pay | Admitting: Physical Medicine and Rehabilitation

## 2024-01-28 ENCOUNTER — Other Ambulatory Visit: Payer: Self-pay

## 2024-01-29 ENCOUNTER — Other Ambulatory Visit: Payer: Self-pay

## 2024-01-29 MED ORDER — TRAZODONE HCL 50 MG PO TABS
50.0000 mg | ORAL_TABLET | Freq: Every day | ORAL | 0 refills | Status: DC
  Filled 2024-01-29: qty 30, 30d supply, fill #0

## 2024-01-29 MED ORDER — LOSARTAN POTASSIUM 50 MG PO TABS
50.0000 mg | ORAL_TABLET | Freq: Every day | ORAL | 0 refills | Status: DC
  Filled 2024-01-29: qty 30, 30d supply, fill #0

## 2024-01-29 MED ORDER — ROSUVASTATIN CALCIUM 20 MG PO TABS
20.0000 mg | ORAL_TABLET | Freq: Every day | ORAL | 0 refills | Status: DC
  Filled 2024-01-29: qty 30, 30d supply, fill #0

## 2024-01-29 MED ORDER — AMANTADINE HCL 100 MG PO CAPS
100.0000 mg | ORAL_CAPSULE | Freq: Every day | ORAL | 0 refills | Status: DC
  Filled 2024-01-29: qty 30, 30d supply, fill #0

## 2024-01-29 MED ORDER — METOPROLOL TARTRATE 25 MG PO TABS
25.0000 mg | ORAL_TABLET | Freq: Two times a day (BID) | ORAL | 0 refills | Status: DC
  Filled 2024-01-29: qty 60, 30d supply, fill #0

## 2024-01-29 MED ORDER — CLOPIDOGREL BISULFATE 75 MG PO TABS
75.0000 mg | ORAL_TABLET | Freq: Every day | ORAL | 0 refills | Status: DC
  Filled 2024-01-29: qty 30, 30d supply, fill #0

## 2024-01-30 ENCOUNTER — Ambulatory Visit: Admitting: Physical Therapy

## 2024-01-30 ENCOUNTER — Encounter: Admitting: Speech Pathology

## 2024-01-30 DIAGNOSIS — R2681 Unsteadiness on feet: Secondary | ICD-10-CM

## 2024-01-30 DIAGNOSIS — M6281 Muscle weakness (generalized): Secondary | ICD-10-CM | POA: Diagnosis not present

## 2024-01-30 DIAGNOSIS — R262 Difficulty in walking, not elsewhere classified: Secondary | ICD-10-CM

## 2024-01-30 DIAGNOSIS — R278 Other lack of coordination: Secondary | ICD-10-CM

## 2024-01-30 NOTE — Therapy (Unsigned)
 OUTPATIENT PHYSICAL THERAPY NEURO TREATMENT   Patient Name: [name] [name] MRN: [id number] DOB:August 15, 1940, 83 y.o., male Today's Date: 01/30/2024   PCP: Rudy Alyce RAMAN, MD REFERRING PROVIDER:   Pegge Toribio PARAS, PA-C    END OF SESSION:  PT End of Session - 01/30/24 0932     Visit Number 14    Number of Visits 25    Date for PT Re-Evaluation 02/29/24    Progress Note Due on Visit 20    PT Start Time 0933    PT Stop Time 1012    PT Time Calculation (min) 39 min    Equipment Utilized During Treatment Gait belt    Activity Tolerance Patient tolerated treatment well    Behavior During Therapy Flat affect              Past Medical History:  Diagnosis Date   Arthritis    Depression    Diabetes mellitus without complication (HCC)    Gout    Hyperlipidemia    Hypertension    Ischemic cardiomyopathy    No past surgical history on file. 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   Ischemic cardiomyopathy 06/12/2018   Thrombocytosis 04/16/2016   Adjustment reaction with prolonged depressive reaction 01/22/2014   Visual loss, right eye 07/30/2012   Type 2 diabetes mellitus (HCC) 06/16/2011   Coronary artery disease 06/16/2011   Hypertension associated with diabetes (HCC) 06/16/2011    ONSET DATE: 11/16/2023  REFERRING DIAG: P36.487 (ICD-10-CM) - Acute ischemic left MCA stroke (HCC)   THERAPY DIAG:  Muscle weakness (generalized)  Difficulty in walking, not elsewhere classified  Other lack of coordination  Unsteadiness on feet  Rationale for Evaluation and Treatment: Rehabilitation  SUBJECTIVE:                                                                                                                                                                                             SUBJECTIVE STATEMENT:    Pt reports that he is doing well. No updates, phyically or medically.     From eval: Pt is a pleasant 83 y/o male referred to PT s/p L MCA stroke. Pt also seeing speech as well. Pt with very weak voice, at times hx difficult to take as a result. He attempted to ambulate to clinic with 4WW but became too fatigued and required assistance into WC. Pt now presents to PT eval in WC. He reports he primarily uses a 4WW at home,  but does not ambulate much during the day (maybe ten minutes total). He reports difficulty with standing up from chairs. He must use grab bars in his bathroom due to difficulty standing up from toilet. He has difficulty with stairs, requires help getting up step to enter his home. He reports no recent falls, but that he does stumble with his 4WW when fatigued.  Pt accompanied by: self  PERTINENT HISTORY:  Via ED d/c note: Presented 11/16/2023 to [facility] with right facial droop left gaze preference and dysarthria. CT/MRI showed small acute nonhemorrhagic left MCA distribution infarction involving the left frontal lobe. No associated mass effect. Patient did not receive TNK. CTA showed no large vessel occlusion or evidence of finding amenable to neurovascular intervention...   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 No none currently but does report arthritis pain in L shoulder limiting ability to lift/raise arm  PRECAUTIONS: Fall  RED FLAGS: None   WEIGHT BEARING RESTRICTIONS: No  FALLS: Has patient fallen in last 6 months? No but does report stumbling with fatigue  LIVING ENVIRONMENT: Lives with: lives with their family son and DIL Lives in: House/apartment, two level home but not using upstairs, stays on first floor Stairs: 1 step to enter home, says no handrails but he has help getting up step Has following  equipment at home: Walker - 4 wheeled, shower chair, and Grab bars  PLOF: Independent  PATIENT GOALS: everything: walking, balance, strength   OBJECTIVE:  Note: Objective measures were completed at Evaluation unless otherwise noted.  DIAGNOSTIC FINDINGS:  imaging via chart  MR brain 11/16/23:  IMPRESSION: 1. Small acute nonhemorrhagic left MCA distribution infarct involving the left frontal lobe. No associated mass effect. 2. Additional punctate subcentimeter acute ischemic nonhemorrhagic infarct within the contralateral right basal ganglia. 3. Underlying moderately advanced chronic microvascular ischemic disease with a few scattered remote lacunar infarcts as above. 4. Chronic occlusion of the left vertebral artery.     Electronically Signed   By: Morene Hoard M.D.   On: 11/16/2023 23:48  CT ANGIO HEAD NECK 11/16/23  IMPRESSION: No large vessel occlusion or evidence of findings amenable to neurovascular intervention.   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   Multiple intracranial vascular stenoses as above. Focal short segment occlusion of an M2 inferior division branch of the right MCA with reconstitution noted.   Mild-to-moderate stenosis of the V4 segment right vertebral artery.   Emphysema (ICD10-J43.9).     Electronically Signed   By: Donnice Mania M.D.   On: 11/16/2023 15:50  CT HEAD 11/16/23: IMPRESSION: 1. 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 2. Subtle hypoattenuation at the anterior limb of the right internal capsule. 3. 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 4. Age indeterminate infarct in the left lentiform nucleus. 5. Moderate atrophy and white matter disease likely reflects  the sequela of chronic microvascular ischemia. 6. No acute hemorrhage or mass lesion.   These results were called by telephone at the time of interpretation on 11/16/2023 at 3:13 pm to provider Dr. Matthews, who verbally acknowledged these results.     Electronically Signed   By: Lonni  Mattern M.D.   On: 11/16/2023 15:14  COGNITION: Overall cognitive status: Within functional limits for tasks assessed but at times difficult to fully determine due to weakness of voice and some difficulty responding to PT questions as a result   SENSATION: WFL to light touch bilat UE and bilat LE  COORDINATION: WFL rapid alt movement UE and finger chin<>target bilat  WFL rapid alt movement LE and heel>shin bilat  EDEMA:  Pt reports no swelling     POSTURE: slight rounded shoulders, forward head, and increased thoracic kyphosis   LOWER EXTREMITY MMT:    Gross bilat LE strength is 4/5, more weakness found proximal than distal   BED MOBILITY:  Not assessed  TRANSFERS: Initially min a, then pt able to complete CGA   STAIRS: Not assessed but impaired per pt report GAIT: Findings: decreased gait speed (See ), FWD flexed posture with gait, decreased bilat heel strike, more pronounced on R than L side  FUNCTIONAL TESTS:  5 times sit to stand: 44.2 sec with use UE  Timed up and go (TUG): 33 second with 4WW  10 meter walk test: 0.53 m/s with 4WW  Berg Balance Scale: deferred : deferred  PATIENT SURVEYS:  SIS-16: 54                                                                                                                              TREATMENT DATE:   TA.  Sit<>stand 2 x 5 with UE support from arm rest.  HEP review/expansion:  Access Code: [email or web address] URL: [email or web address] Date: 01/30/2024 Prepared by: Massie Dollar  Exercises - Seated March  - 10 reps - Seated Long Arc Quad  - 10 reps - 3 sec hold - Standing Knee Flexion AROM with Chair  Support  - 10 reps - Side Stepping with Counter Support  - 10 reps - Sit to Stand with Armchair - 6reps - Walking backward with Counter Support  - 5 reps - Semi-Tandem Balance at [employer] Eyes Open  - 1 x daily - 7 x weekly - 2 sets - 4 reps - 15 seconds hold - Standing March with Counter Support  - 1 x daily - 7 x weekly - 3 sets - 10 reps  PATIENT EDUCATION: Education details: Pt educated throughout session about proper posture and technique with exercises. Improved exercise technique, movement at target joints, use of target muscles after min to mod verbal, visual, tactile cues.  Person educated: Patient Education method: Explanation, Demonstration, and Verbal cues Education comprehension: verbalized understanding and returned demonstration  HOME EXERCISE PROGRAM: Access Code: [email or web address] URL: [email or web address] Date: 01/30/2024 Prepared by: Massie Dollar  Exercises - Seated March  - 1 x daily - 7 x weekly - 2 sets - 10 reps - Seated Long Arc Quad  - 1 x daily - 7 x weekly - 2 sets - 10 reps - 3 sec hold - Standing Knee Flexion AROM with Chair Support  - 1  x daily - 7 x weekly - 2 sets - 10 reps - Side Stepping with Counter Support  - 1 x daily - 7 x weekly - 3 sets - 10 reps - Sit to Stand with Armchair  - 1 x daily - 7 x weekly - 3 sets - 10 reps - Walking with Counter Support  - 1 x daily - 7 x weekly - 3 sets - 10 reps - Semi-Tandem Balance at [employer] Eyes Open  - 1 x daily - 7 x weekly - 2 sets - 4 reps - 15 seconds hold - Standing March with Counter Support  - 1 x daily - 7 x weekly - 3 sets - 10 reps  GOALS: Goals reviewed with patient? Yes  SHORT TERM GOALS: Target date: 01/18/2024  Patient will be independent in home exercise program to improve strength/mobility for better functional independence with ADLs. Baseline: 01/15/2024= Patient able to verbalize and demonstrate his seated HEP without prompting- states compliance.  Goal status: MET   LONG  TERM GOALS: Target date:02/29/2024  Patient will increase SIS-16 score by at least 10 points  to demonstrate increased ease with ADLs and quality of life.  Baseline: 54 Goal status: INITIAL  2.  Patient (> 47 years old) will complete five times sit to stand test in < 15 seconds indicating an increased LE strength and improved balance. Baseline: 44.2 sec with use of BUE; 01/15/2024= 36.46 sec with BUE Support (Still unable to rise without UE Support and some posterior lean- CGA)  Goal status: PROGRESSING   3.  Patient will increase Berg Balance score by > 6 points to demonstrate decreased fall risk during functional activities Baseline: 34 Goal status: INITIAL  4.  Patient will increase 10 meter walk test to >1.19m/s as to improve gait speed for better community ambulation and to reduce fall risk. Baseline: 0.53 m/s with 4WW; 01/15/2024= 0.58 m/s with 4WW Goal status: PROGRESSING  5.  Patient will reduce timed up and go to <11 seconds to reduce fall risk and demonstrate improved transfer/gait ability. Baseline: 33 sec with 4WW; 01/15/2024= 25.42 sec avg with 4WW Goal status: PROGRESSING  6. Patient will increase six minute walk test distance to >1000 for progression to community ambulator and improve gait ability Baseline: 310 feet using a 4WW with CGA and only completes 3:17 sec prior to requesting to sit secondary to fatigue; 01/15/2024= 610 feet in with 4WW Goal status: PROGRESSING   ASSESSMENT:  CLINICAL IMPRESSION: Continuing PT POC working on dynamic balance and LE strength. Pt tolerated increased distance with weighted gait on this day. but mild limited by hip instability and foot drag with fatigue,  on the R side with fatigue. Also focused improved BLE strength and power, as pt Will need to increase BLE strength and power to improve function and safety in house hold mobility with increased independence. Pt would benefit from continued PT to progress towards proper gait mechanics as  well as improved activity tolerance to allow return to PLOF and improved overall QoL.   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   REHAB POTENTIAL: Good  CLINICAL DECISION MAKING: Evolving/moderate complexity  EVALUATION COMPLEXITY:  Moderate  PLAN:  PT FREQUENCY: 1-2x/week  PT DURATION: 12 weeks  PLANNED INTERVENTIONS: 97164- PT Re-evaluation, 97750- Physical Performance Testing, 97110-Therapeutic exercises, 97530- Therapeutic activity, [id number]- Neuromuscular re-education, 97535- Self Care, [id number]- Manual therapy, [phone]- Gait training, [phone]- Orthotic Initial, [phone]- Orthotic/Prosthetic subsequent, [phone]- Canalith repositioning, Patient/Family education, Balance training, Stair training, Taping, Joint mobilization, Spinal mobilization, Vestibular training, DME instructions, Wheelchair mobility training, Cryotherapy, and Moist heat  PLAN FOR NEXT SESSION:    Progress gait with least restrict assistive device  Improve dynamic balance including narrowed BOS Progress overall LE strength  Progress HEP as indicated.    Massie Dollar PT, DPT  Physical Therapist - [place]  [facility]  9:36 AM 01/30/24

## 2024-02-01 ENCOUNTER — Ambulatory Visit: Admitting: Cardiology

## 2024-02-01 ENCOUNTER — Encounter: Admitting: Speech Pathology

## 2024-02-02 ENCOUNTER — Ambulatory Visit: Admitting: Physical Therapy

## 2024-02-02 DIAGNOSIS — R2681 Unsteadiness on feet: Secondary | ICD-10-CM

## 2024-02-02 DIAGNOSIS — R262 Difficulty in walking, not elsewhere classified: Secondary | ICD-10-CM

## 2024-02-02 DIAGNOSIS — M6281 Muscle weakness (generalized): Secondary | ICD-10-CM

## 2024-02-02 DIAGNOSIS — R278 Other lack of coordination: Secondary | ICD-10-CM

## 2024-02-02 NOTE — Therapy (Signed)
 OUTPATIENT PHYSICAL THERAPY NEURO TREATMENT   Patient Name: [name] [name] MRN: [id number] DOB:02-01-1941, 83 y.o., male Today's Date: 02/02/2024   PCP: Rudy Alyce RAMAN, MD REFERRING PROVIDER:   Pegge Toribio PARAS, PA-C    END OF SESSION:***  PT End of Session - 02/02/24 0803     Visit Number 15    Number of Visits 25    Date for PT Re-Evaluation 02/29/24    Progress Note Due on Visit 20    PT Start Time 0804    PT Stop Time 0843    PT Time Calculation (min) 39 min    Equipment Utilized During Treatment Gait belt    Activity Tolerance Patient tolerated treatment well    Behavior During Therapy Flat affect           Past Medical History:  Diagnosis Date   Arthritis    Depression    Diabetes mellitus without complication (HCC)    Gout    Hyperlipidemia    Hypertension    Ischemic cardiomyopathy    No past surgical history on file. 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   Ischemic cardiomyopathy 06/12/2018   Thrombocytosis 04/16/2016   Adjustment reaction with prolonged depressive reaction 01/22/2014   Visual loss, right eye 07/30/2012   Type 2 diabetes mellitus (HCC) 06/16/2011   Coronary artery disease 06/16/2011   Hypertension associated with diabetes (HCC) 06/16/2011    ONSET DATE: 11/16/2023  REFERRING DIAG: P36.487 (ICD-10-CM) - Acute ischemic left MCA stroke (HCC)   THERAPY DIAG:  Muscle weakness (generalized)  Difficulty in walking, not elsewhere classified  Other lack of coordination  Unsteadiness on feet  Rationale for Evaluation and Treatment: Rehabilitation  SUBJECTIVE:                                                                                                                                                                                             SUBJECTIVE STATEMENT:   ***  02/02/2024 Patient states he is doing well, but this therapy session was a bit early. Patient reports the updated HEP has been going well. Pt reports doing HEP every other day. Denies pain. Denies stumbles/falls.    From eval: Pt is a pleasant 83 y/o male referred to PT s/p L MCA stroke. Pt also seeing speech as well. Pt with very weak voice, at times hx difficult to take as a result. He attempted to ambulate to clinic with 4WW but became too fatigued and required  assistance into WC. Pt now presents to PT eval in WC. He reports he primarily uses a 4WW at home, but does not ambulate much during the day (maybe ten minutes total). He reports difficulty with standing up from chairs. He must use grab bars in his bathroom due to difficulty standing up from toilet. He has difficulty with stairs, requires help getting up step to enter his home. He reports no recent falls, but that he does stumble with his 4WW when fatigued.  Pt accompanied by: self  PERTINENT HISTORY:  Via ED d/c note: Presented 11/16/[facility] to [facility] with right facial droop left gaze preference and dysarthria. CT/MRI showed small acute nonhemorrhagic left MCA distribution infarction involving the left frontal lobe. No associated mass effect. Patient did not receive TNK. CTA showed no large vessel occlusion or evidence of finding amenable to neurovascular intervention...   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 No none currently but does report arthritis pain in L shoulder limiting ability to lift/raise arm  PRECAUTIONS: Fall  RED FLAGS: None   WEIGHT BEARING RESTRICTIONS: No  FALLS: Has patient fallen in last 6 months? No but does report stumbling with fatigue  LIVING ENVIRONMENT: Lives with: lives with their family son and DIL Lives in: House/apartment, two level  home but not using upstairs, stays on first floor Stairs: 1 step to enter home, says no handrails but he has help getting up step Has following equipment at home: Walker - 4 wheeled, shower chair, and Grab bars  PLOF: Independent  PATIENT GOALS: everything: walking, balance, strength   OBJECTIVE:  Note: Objective measures were completed at Evaluation unless otherwise noted.  DIAGNOSTIC FINDINGS:  imaging via chart  MR brain 11/16/23:  IMPRESSION: 1. Small acute nonhemorrhagic left MCA distribution infarct involving the left frontal lobe. No associated mass effect. 2. Additional punctate subcentimeter acute ischemic nonhemorrhagic infarct within the contralateral right basal ganglia. 3. Underlying moderately advanced chronic microvascular ischemic disease with a few scattered remote lacunar infarcts as above. 4. Chronic occlusion of the left vertebral artery.     Electronically Signed   By: Morene Hoard M.D.   On: 11/16/2023 23:48  CT ANGIO HEAD NECK 11/16/23  IMPRESSION: No large vessel occlusion or evidence of findings amenable to neurovascular intervention.   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   Multiple intracranial vascular stenoses as above. Focal short segment occlusion of an M2 inferior division branch of the right MCA with reconstitution noted.   Mild-to-moderate stenosis of the V4 segment right vertebral artery.   Emphysema (ICD10-J43.9).     Electronically Signed   By: Donnice Mania M.D.   On: 11/16/2023 15:50  CT HEAD 11/16/23: IMPRESSION: 1. 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 2. Subtle hypoattenuation at the anterior limb of the right internal capsule. 3. Subcortical white matter infarct in the anterior right frontal lobe superior to the frontal horn of the right  lateral ventricle. 4. Age indeterminate infarct in the left lentiform nucleus. 5. Moderate atrophy and white matter disease likely reflects the sequela of chronic microvascular ischemia. 6. No acute hemorrhage or mass lesion.   These results were called by telephone at the time of interpretation on 11/16/2023 at 3:13 pm  to provider Dr. Matthews, who verbally acknowledged these results.     Electronically Signed   By: Lonni Necessary M.D.   On: 11/16/2023 15:14  COGNITION: Overall cognitive status: Within functional limits for tasks assessed but at times difficult to fully determine due to weakness of voice and some difficulty responding to PT questions as a result   SENSATION: WFL to light touch bilat UE and bilat LE  COORDINATION: WFL rapid alt movement UE and finger chin<>target bilat  WFL rapid alt movement LE and heel>shin bilat  EDEMA:  Pt reports no swelling     POSTURE: slight rounded shoulders, forward head, and increased thoracic kyphosis   LOWER EXTREMITY MMT:    Gross bilat LE strength is 4/5, more weakness found proximal than distal   BED MOBILITY:  Not assessed  TRANSFERS: Initially min a, then pt able to complete CGA   STAIRS: Not assessed but impaired per pt report GAIT: Findings: decreased gait speed (See ), FWD flexed posture with gait, decreased bilat heel strike, more pronounced on R than L side  FUNCTIONAL TESTS:  5 times sit to stand: 44.2 sec with use UE  Timed up and go (TUG): 33 second with 4WW  10 meter walk test: 0.53 m/s with 4WW  Berg Balance Scale: deferred : deferred  PATIENT SURVEYS:  SIS-16: 54                                                                                                                              TREATMENT DATE: 02/02/2024  Pt arrived to therapy session in transport chair.   Gait training 380ft using rollator with CGA for safety. Pt demonstrating the following gait deviations with therapist  providing the described cuing and facilitation for improvement:  Reciprocal stepping balance With fatigue has worsening anterior trunk lean/forward flexion with increased weight through AD Cuing for improved upright posture Pt does report sometimes the rollator will start to get away from him Possible R knee going into full extension? Decreased glute activation throughout  Pt fatigued after this gait distance, stating he was walking faster than he normally does.    B LE functional strengthening, including:  Repeated sit<>stands from green chair with airex pad to raise floor-to-seat height 2x5 reps Cuing not to use UE support, pt starts with hands on knees and then progresses to no UE support Pt very excited by this achievement!  Light min A for steadying Cuing to increase anterior trunk lean and pt much more successful   Dynamic balance interventions as follows:  Gait training ~76ft to/from balance bar, no AD, with CGA/light min A for steadying Performed this between each of the below interventions Standing at balance bar, marches X10reps per LE Pt reports this as fatiguing Demos excellent foot clearance to promote longer SLS Standing at balance bar, knee flexion AROM x10reps per LE Cuing to maintain upright posture Side stepping at balance bar  YTB around pt's knees for increased  hip abductor activation 3 laps on balance bar (pt achieving ~3-4 steps in each direction) CGA for safety Pt reports this as fatiguing    Static standing balance interventions as follows:  1/2 tandem stance x30 sec each LE Pt has minor postural sway with this still being challenging   Patient requires seated rest break between each of the above interventions due to continued significant endurance deficits.  Gait training final 163ft using rollator as described above for increased gait endurance training.    ***   PATIENT EDUCATION: Education details: Pt educated throughout session about  proper posture and technique with exercises. Improved exercise technique, movement at target joints, use of target muscles after min to mod verbal, visual, tactile cues.  Person educated: Patient Education method: Explanation, Demonstration, and Verbal cues Education comprehension: verbalized understanding and returned demonstration  HOME EXERCISE PROGRAM: Access Code: [email or web address] URL: [email or web address] Date: 01/30/2024 Prepared by: Massie Dollar  Exercises - Seated March  - 1 x daily - 7 x weekly - 2 sets - 10 reps - Seated Long Arc Quad  - 1 x daily - 7 x weekly - 2 sets - 10 reps - 3 sec hold - Standing Knee Flexion AROM with Chair Support  - 1 x daily - 7 x weekly - 2 sets - 10 reps - Side Stepping with Counter Support  - 1 x daily - 7 x weekly - 3 sets - 10 reps - Sit to Stand with Armchair  - 1 x daily - 7 x weekly - 3 sets - 10 reps - Walking with Counter Support  - 1 x daily - 7 x weekly - 3 sets - 10 reps - Semi-Tandem Balance at [employer] Open  - 1 x daily - 7 x weekly - 2 sets - 4 reps - 15 seconds hold - Standing March with Counter Support  - 1 x daily - 7 x weekly - 3 sets - 10 reps  GOALS: Goals reviewed with patient? Yes  SHORT TERM GOALS: Target date: 01/18/2024  Patient will be independent in home exercise program to improve strength/mobility for better functional independence with ADLs. Baseline: 01/15/2024= Patient able to verbalize and demonstrate his seated HEP without prompting- states compliance.  Goal status: MET   LONG TERM GOALS: Target date:02/29/2024  Patient will increase SIS-16 score by at least 10 points  to demonstrate increased ease with ADLs and quality of life.  Baseline: 54 Goal status: INITIAL  2.  Patient (> 70 years old) will complete five times sit to stand test in < 15 seconds indicating an increased LE strength and improved balance. Baseline: 44.2 sec with use of BUE; 01/15/2024= 36.46 sec with BUE Support (Still unable  to rise without UE Support and some posterior lean- CGA)  Goal status: PROGRESSING   3.  Patient will increase Berg Balance score by > 6 points to demonstrate decreased fall risk during functional activities Baseline: 34 Goal status: INITIAL  4.  Patient will increase 10 meter walk test to >1.53m/s as to improve gait speed for better community ambulation and to reduce fall risk. Baseline: 0.53 m/s with 4WW; 01/15/2024= 0.58 m/s with 4WW Goal status: PROGRESSING  5.  Patient will reduce timed up and go to <11 seconds to reduce fall risk and demonstrate improved transfer/gait ability. Baseline: 33 sec with 4WW; 01/15/2024= 25.42 sec avg with 4WW Goal status: PROGRESSING  6. Patient will increase six minute walk test distance to >1000 for progression to community ambulator and improve  gait ability Baseline: 310 feet using a 4WW with CGA and only completes 3:17 sec prior to requesting to sit secondary to fatigue; 01/15/2024= 610 feet in with 4WW Goal status: PROGRESSING   ASSESSMENT:  CLINICAL IMPRESSION: *** Continuing PT POC working on dynamic balance and LE strength. PT expanded pt HEP to improve BLE strength and balance with increased tolerance to standing. Pt able to return demonstration and  verbalizes understanding of need to maintain compliance to HEP to maximize PT outcomes.  Pt would benefit from continued PT to progress towards proper gait mechanics as well as improved activity tolerance to allow return to PLOF and improved overall QoL.   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   REHAB POTENTIAL: Good  CLINICAL DECISION MAKING: Evolving/moderate complexity  EVALUATION COMPLEXITY: Moderate  PLAN:  PT FREQUENCY: 1-2x/week  PT DURATION: 12 weeks  PLANNED INTERVENTIONS: 97164- PT Re-evaluation, 97750- Physical Performance Testing, 97110-Therapeutic exercises, 97530- Therapeutic activity, [id number]- Neuromuscular re-education, 97535- Self Care, [id number]- Manual therapy, [id number]- Gait training, [phone]- Orthotic Initial, [phone]- Orthotic/Prosthetic subsequent, [phone]- Canalith repositioning, Patient/Family education, Balance training, Stair training, Taping, Joint mobilization, Spinal mobilization, Vestibular training, DME instructions, Wheelchair mobility training, Cryotherapy, and Moist heat  PLAN FOR NEXT SESSION:   *** Progress gait with least restrict assistive device  Improve dynamic balance including narrowed BOS Progress overall LE strength  Progress HEP as indicated.     Connell Kiss, PT, DPT, NCS, CSRS Physical Therapist - [place]  [facility]  8:43 AM 02/02/24

## 2024-02-05 ENCOUNTER — Ambulatory Visit: Admitting: Speech Pathology

## 2024-02-05 ENCOUNTER — Ambulatory Visit

## 2024-02-05 DIAGNOSIS — R262 Difficulty in walking, not elsewhere classified: Secondary | ICD-10-CM

## 2024-02-05 DIAGNOSIS — M6281 Muscle weakness (generalized): Secondary | ICD-10-CM | POA: Diagnosis not present

## 2024-02-05 DIAGNOSIS — R2681 Unsteadiness on feet: Secondary | ICD-10-CM

## 2024-02-05 DIAGNOSIS — R278 Other lack of coordination: Secondary | ICD-10-CM

## 2024-02-05 NOTE — Progress Notes (Unsigned)
 Electrophysiology Clinic Note    Date:  02/06/2024  Patient ID:  [name] [name], [name] [name] Dec 20, 1940, MRN [id number] PCP:  Rudy Alyce RAMAN, MD  Cardiologist:  None Electrophysiologist: None    Discussed the use of AI scribe software for clinical note transcription with the patient, who gave verbal consent to proceed.   Patient Profile    Chief Complaint: cryptogenic stroke  History of Present Illness: [name] [name] is a 83 y.o. male with PMH notable for CAD s/p PCI, HFmrEF, ICM, HTN, HLD, PVCs, T2DM, recent cryptogenic stroke; seen today for post hospital follow up after CVA.    Admitted 4/10-4/13/2025 for R-sided facial droop combined with inability to speak. MRI positive for acute CVA. He was discharged with an ambulatory monitor which was not completed. He was discharged to inpatient rehab.   He saw PCP earlier this month, ASA started, BP elevated but planning to monitor. Referred back to cardiology.  On follow-up today, he is continuing to heal from his recent stroke.  He now lives with his son, prior to stroke he lived alone.  He walks with a walker and uses a gait belt.  He continues to work with PT regularly.  He denies chest pain, chest pressure, palpitations.  He checks blood pressure regularly at home always before medicines in the morning, systolics 150-160.   He denies chest pain, chest pressure, palpitations, dizziness.     Arrhythmia/Device History No specialty comments available.    ROS:  Please see the history of present illness. All other systems are reviewed and otherwise negative.    Physical Exam    VS:  BP (!) 158/81 (BP Location: Left Arm, Patient Position: Sitting, Cuff Size: Normal)   Pulse 69   Ht 5' 11 (1.803 m)   SpO2 94%   BMI 26.00 kg/m  BMI: Body mass index is 26 kg/m.  Wt Readings from Last 3 Encounters:  01/08/24 186 lb 6.4 oz (84.6 kg)  11/21/23 195 lb 12.3 oz (88.8 kg)  11/17/23 203 lb 4.2 oz (92.2 kg)     GEN- The  patient is well appearing, alert and oriented x 3 today.   Lungs- Clear to ausculation bilaterally, normal work of breathing.  Heart- Regular rate and rhythm, no murmurs, rubs or gallops Extremities- No peripheral edema, warm, dry    Studies Reviewed   Previous EP, cardiology notes.    EKG is ordered. Personal review of EKG from today shows:    EKG Interpretation Date/Time:  Tuesday February 06 2024 11:00:56 EDT Ventricular Rate:  69 PR Interval:  156 QRS Duration:  98 QT Interval:  426 QTC Calculation: 456 R Axis:   78  Text Interpretation: Normal sinus rhythm Inferior infarct , age undetermined Anterior infarct , age undetermined T wave abnormality, consider lateral ischemia Confirmed by Naryah Clenney [phone]) on 02/06/2024 11:03:24 AM    TTE, 11/17/2023  1. Left ventricular ejection fraction, by estimation, is 40 to 45%. The left ventricle has mildly decreased function. 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 There is moderate left ventricular hypertrophy. Left ventricular diastolic parameters are consistent with Grade I diastolic dysfunction (impaired relaxation).   2. Right ventricular systolic function is normal. The right ventricular size is normal.   3. The mitral valve is normal in structure. No evidence of mitral valve regurgitation. No evidence of mitral stenosis.   4. The  aortic valve is calcified. There is mild calcification of the aortic valve. Aortic valve regurgitation is not visualized. Aortic valve sclerosis/calcification is present, without any evidence of aortic stenosis.   5. The inferior vena cava is normal in size with greater than 50% respiratory variability, suggesting right atrial pressure of 3 mmHg.   TTE, 07/11/2018 (via [facility])   MILD LV DYSFUNCTION (See above, 45%)    NORMAL LA PRESSURES  WITH DIASTOLIC DYSFUNCTION    NORMAL RIGHT VENTRICULAR SYSTOLIC FUNCTION    VALVULAR REGURGITATION: TRIVIAL TR    NO VALVULAR STENOSIS    TRIVIAL PERICARDIAL EFFUSION    ECTOPY THROUGHOUT EXAM    NO PRIOR STUDY FOR COMPARISON    Assessment and Plan     #) cryptogenic stroke All EKGs reviewed in chart, no AFib identified No h/o AFib We discussed ILR implant including risks/benefits, peri-procedural expectations and wound care Patient is clear in his decision to proceed with implant He requested I call his son, [place], to update, who is also in agreement with ILR implant  #) HTN Remains quite hypertensive on home readings and in office Will increase amlodipine  to 5mg  daily Continue 50mg  losartan  Continue to follow-up with PCP for ongoing BP mgmt        Current medicines are reviewed at length with the patient today.   The patient does not have concerns regarding his medicines.  The following changes were made today:  none  Labs/ tests ordered today include:  Orders Placed This Encounter  Procedures   EKG 12-Lead     Disposition: Follow up with Dr. Cindie  within 4 weeks for ILR implant    Signed, Chantal Needle, NP  02/06/24  12:26 PM  Electrophysiology [facility]

## 2024-02-05 NOTE — Therapy (Signed)
 OUTPATIENT PHYSICAL THERAPY NEURO TREATMENT   Patient Name: [name] [name] MRN: [id number] DOB:06-16-1941, 83 y.o., male Today's Date: 02/05/2024   PCP: Rudy Alyce RAMAN, MD REFERRING PROVIDER:   Pegge Toribio PARAS, PA-C    END OF SESSION:  PT End of Session - 02/05/24 1015     Visit Number 16    Number of Visits 25    Date for PT Re-Evaluation 02/29/24    Progress Note Due on Visit 20    PT Start Time 1016    PT Stop Time 1057    PT Time Calculation (min) 41 min    Equipment Utilized During Treatment Gait belt    Activity Tolerance Patient tolerated treatment well    Behavior During Therapy Flat affect               Past Medical History:  Diagnosis Date   Arthritis    Depression    Diabetes mellitus without complication (HCC)    Gout    Hyperlipidemia    Hypertension    Ischemic cardiomyopathy    History reviewed. No pertinent surgical history. 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   Ischemic cardiomyopathy 06/12/2018   Thrombocytosis 04/16/2016   Adjustment reaction with prolonged depressive reaction 01/22/2014   Visual loss, right eye 07/30/2012   Type 2 diabetes mellitus (HCC) 06/16/2011   Coronary artery disease 06/16/2011   Hypertension associated with diabetes (HCC) 06/16/2011    ONSET DATE: 11/16/2023  REFERRING DIAG: P36.487 (ICD-10-CM) - Acute ischemic left MCA stroke (HCC)   THERAPY DIAG:  Muscle weakness (generalized)  Difficulty in walking, not elsewhere classified  Other lack of coordination  Unsteadiness on feet  Rationale for Evaluation and Treatment: Rehabilitation  SUBJECTIVE:                                                                                                                                                                                              SUBJECTIVE STATEMENT:   Pt reports he is feeling good, no reports of falls since previous session.     From eval: Pt is a pleasant 83 y/o male referred to PT s/p L MCA stroke. Pt also seeing speech as well. Pt with very weak voice, at times hx difficult to take as a result. He attempted to ambulate to clinic with 4WW but became too fatigued and required assistance into WC. Pt now presents to PT eval in WC. He reports he primarily uses a [phone]  at home, but does not ambulate much during the day (maybe ten minutes total). He reports difficulty with standing up from chairs. He must use grab bars in his bathroom due to difficulty standing up from toilet. He has difficulty with stairs, requires help getting up step to enter his home. He reports no recent falls, but that he does stumble with his 4WW when fatigued.  Pt accompanied by: self  PERTINENT HISTORY:  Via ED d/c note: Presented 11/16/[facility] to [facility] with right facial droop left gaze preference and dysarthria. CT/MRI showed small acute nonhemorrhagic left MCA distribution infarction involving the left frontal lobe. No associated mass effect. Patient did not receive TNK. CTA showed no large vessel occlusion or evidence of finding amenable to neurovascular intervention...   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 No none currently but does report arthritis pain in L shoulder limiting ability to lift/raise arm  PRECAUTIONS: Fall  RED FLAGS: None   WEIGHT BEARING RESTRICTIONS: No  FALLS: Has patient fallen in last 6 months? No but does report stumbling with fatigue  LIVING ENVIRONMENT: Lives with: lives with their family son and DIL Lives in: House/apartment, two level home but not using upstairs, stays on first floor Stairs: 1 step to enter home, says no handrails but he has help  getting up step Has following equipment at home: Walker - 4 wheeled, shower chair, and Grab bars  PLOF: Independent  PATIENT GOALS: everything: walking, balance, strength   OBJECTIVE:  Note: Objective measures were completed at Evaluation unless otherwise noted.  DIAGNOSTIC FINDINGS:  imaging via chart  MR brain 11/16/23:  IMPRESSION: 1. Small acute nonhemorrhagic left MCA distribution infarct involving the left frontal lobe. No associated mass effect. 2. Additional punctate subcentimeter acute ischemic nonhemorrhagic infarct within the contralateral right basal ganglia. 3. Underlying moderately advanced chronic microvascular ischemic disease with a few scattered remote lacunar infarcts as above. 4. Chronic occlusion of the left vertebral artery.     Electronically Signed   By: Morene Hoard M.D.   On: 11/16/2023 23:48  CT ANGIO HEAD NECK 11/16/23  IMPRESSION: No large vessel occlusion or evidence of findings amenable to neurovascular intervention.   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   Multiple intracranial vascular stenoses as above. Focal short segment occlusion of an M2 inferior division branch of the right MCA with reconstitution noted.   Mild-to-moderate stenosis of the V4 segment right vertebral artery.   Emphysema (ICD10-J43.9).     Electronically Signed   By: Donnice Mania M.D.   On: 11/16/2023 15:50  CT HEAD 11/16/23: IMPRESSION: 1. 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 2. Subtle hypoattenuation at the anterior limb of the right internal capsule. 3. 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 4. Age indeterminate infarct in the left lentiform nucleus. 5. Moderate atrophy and white matter  disease likely reflects the sequela of chronic microvascular ischemia. 6. No acute hemorrhage or mass lesion.   These results were called by telephone at the time of interpretation on 11/16/2023 at 3:13 pm to provider Dr. Matthews, who verbally acknowledged these results.     Electronically Signed   By:  Lonni Necessary M.D.   On: 11/16/2023 15:14  COGNITION: Overall cognitive status: Within functional limits for tasks assessed but at times difficult to fully determine due to weakness of voice and some difficulty responding to PT questions as a result   SENSATION: WFL to light touch bilat UE and bilat LE  COORDINATION: WFL rapid alt movement UE and finger chin<>target bilat  WFL rapid alt movement LE and heel>shin bilat  EDEMA:  Pt reports no swelling     POSTURE: slight rounded shoulders, forward head, and increased thoracic kyphosis   LOWER EXTREMITY MMT:    Gross bilat LE strength is 4/5, more weakness found proximal than distal   BED MOBILITY:  Not assessed  TRANSFERS: Initially min a, then pt able to complete CGA   STAIRS: Not assessed but impaired per pt report GAIT: Findings: decreased gait speed (See ), FWD flexed posture with gait, decreased bilat heel strike, more pronounced on R than L side  FUNCTIONAL TESTS:  5 times sit to stand: 44.2 sec with use UE  Timed up and go (TUG): 33 second with 4WW  10 meter walk test: 0.53 m/s with 4WW  Berg Balance Scale: deferred : deferred  PATIENT SURVEYS:  SIS-16: 54                                                                                                                              TREATMENT DATE:   BP assessed before treatment session: 154/83 (103) HR 69- no symptoms  TE   Weighted ambulation with 2.5 # AW and 4WW in hallway; 144ft x2 laps- cues provided to increase L foot clearance throughout- SpO2 95 HR 76 after walking, no reports of dizziness   In // bars: Step up onto Airex pad &  over x10 Side step onto Airex pad & over x10 Marches on Airex pad x10 BLE   Weighted ambulation with 2.5# AW and 4WW in main hallway walkway; 200 ft  Removed AW due to increased bilateral foot drag- Ambulation in main hallway walkway no weights x250 ft and 4WW- cues for keeping rollator closer to offload feet for increased foot clearance; SpO2 95 HR 73 after  Seated: - Marches w/ 2# AW x10 BLE - LAQ w/ 2# AW - GTB HS curls 2x10 - GTB abduction 2x10  - GTB adduction x10 BLE  PATIENT EDUCATION: Education details: Pt educated throughout session about proper posture and technique with exercises. Improved exercise technique, movement at target joints, use of target muscles after min to mod verbal, visual, tactile cues.  Person educated: Patient Education method: Explanation, Demonstration, and Verbal cues Education comprehension: verbalized understanding and returned demonstration  HOME EXERCISE PROGRAM: Access Code: [email or web address] URL: [email or web address] Date: 01/30/2024 Prepared by: Massie Dollar  Exercises - Seated March  - 1 x daily - 7 x weekly - 2 sets - 10 reps - Seated Long Arc Quad  - 1 x daily - 7 x weekly - 2  sets - 10 reps - 3 sec hold - Standing Knee Flexion AROM with Chair Support  - 1 x daily - 7 x weekly - 2 sets - 10 reps - Side Stepping with Counter Support  - 1 x daily - 7 x weekly - 3 sets - 10 reps - Sit to Stand with Armchair  - 1 x daily - 7 x weekly - 3 sets - 10 reps - Walking with Counter Support  - 1 x daily - 7 x weekly - 3 sets - 10 reps - Semi-Tandem Balance at [employer] Eyes Open  - 1 x daily - 7 x weekly - 2 sets - 4 reps - 15 seconds hold - Standing March with Counter Support  - 1 x daily - 7 x weekly - 3 sets - 10 reps  GOALS: Goals reviewed with patient? Yes  SHORT TERM GOALS: Target date: 01/18/2024  Patient will be independent in home exercise program to improve strength/mobility for better functional independence with  ADLs. Baseline: 01/15/2024= Patient able to verbalize and demonstrate his seated HEP without prompting- states compliance.  Goal status: MET   LONG TERM GOALS: Target date:02/29/2024  Patient will increase SIS-16 score by at least 10 points  to demonstrate increased ease with ADLs and quality of life.  Baseline: 54 Goal status: INITIAL  2.  Patient (> 61 years old) will complete five times sit to stand test in < 15 seconds indicating an increased LE strength and improved balance. Baseline: 44.2 sec with use of BUE; 01/15/2024= 36.46 sec with BUE Support (Still unable to rise without UE Support and some posterior lean- CGA)  Goal status: PROGRESSING   3.  Patient will increase Berg Balance score by > 6 points to demonstrate decreased fall risk during functional activities Baseline: 34 Goal status: INITIAL  4.  Patient will increase 10 meter walk test to >1.62m/s as to improve gait speed for better community ambulation and to reduce fall risk. Baseline: 0.53 m/s with 4WW; 01/15/2024= 0.58 m/s with 4WW Goal status: PROGRESSING  5.  Patient will reduce timed up and go to <11 seconds to reduce fall risk and demonstrate improved transfer/gait ability. Baseline: 33 sec with 4WW; 01/15/2024= 25.42 sec avg with 4WW Goal status: PROGRESSING  6. Patient will increase six minute walk test distance to >1000 for progression to community ambulator and improve gait ability Baseline: 310 feet using a 4WW with CGA and only completes 3:17 sec prior to requesting to sit secondary to fatigue; 01/15/2024= 610 feet in with 4WW Goal status: PROGRESSING   ASSESSMENT:  CLINICAL IMPRESSION: Pt tolerated increased ambulation distance and LE strengthening well today with minimal reports of fatigue throughout. Pt demonstrated decreased foot clearance with weighted ambulation that improved with removing ankle weights and extensive cueing for increased hip flexion and gait mechanics. Pt remained motivated to participate  throughout session. Pt would benefit from continued PT to progress towards proper gait mechanics as well as improved activity tolerance to allow return to PLOF and improved overall QoL.   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   REHAB POTENTIAL: Good  CLINICAL DECISION MAKING: Evolving/moderate complexity  EVALUATION COMPLEXITY: Moderate  PLAN:  PT FREQUENCY: 1-2x/week  PT DURATION: 12 weeks  PLANNED INTERVENTIONS: 97164- PT Re-evaluation, 97750- Physical Performance Testing, 97110-Therapeutic exercises, 97530- Therapeutic activity, [id number]- Neuromuscular re-education, 97535- Self Care, 02859- Manual therapy, [phone]- Gait training, [phone]- Orthotic Initial, [phone]- Orthotic/Prosthetic subsequent, [phone]- Canalith repositioning, Patient/Family education, Balance training, Stair training, Taping, Joint mobilization, Spinal mobilization, Vestibular training, DME instructions, Wheelchair mobility training, Cryotherapy, and Moist heat  PLAN FOR NEXT SESSION:    Progress gait with least restrict assistive device  Improve dynamic balance including narrowed BOS Progress overall LE strength  Progress HEP as indicated.   Panhia Karl, SPT  This entire session was performed under direct supervision and direction of a licensed therapist/therapist assistant . I have personally read, edited and approve of the note as written.  Marina  Leopoldo, PT, DPT Physical Therapist - [facility]  Outpatient Physical Therapy- Main  Campus [phone]    1:05 PM 02/05/24

## 2024-02-06 ENCOUNTER — Ambulatory Visit: Attending: Cardiology | Admitting: Cardiology

## 2024-02-06 ENCOUNTER — Inpatient Hospital Stay: Admitting: Physical Medicine and Rehabilitation

## 2024-02-06 VITALS — BP 158/81 | HR 69 | Ht 71.0 in

## 2024-02-06 DIAGNOSIS — I639 Cerebral infarction, unspecified: Secondary | ICD-10-CM

## 2024-02-06 DIAGNOSIS — I152 Hypertension secondary to endocrine disorders: Secondary | ICD-10-CM

## 2024-02-06 DIAGNOSIS — E1159 Type 2 diabetes mellitus with other circulatory complications: Secondary | ICD-10-CM

## 2024-02-06 MED ORDER — AMLODIPINE BESYLATE 5 MG PO TABS
5.0000 mg | ORAL_TABLET | Freq: Every day | ORAL | 3 refills | Status: DC
  Filled 2024-04-20: qty 90, 90d supply, fill #0

## 2024-02-06 NOTE — Patient Instructions (Addendum)
 Medication Instructions:  Increase Amlodipine  to 5 mg daily   *If you need a refill on your cardiac medications before your next appointment, please call your pharmacy*  Follow-Up: At [facility], you and your health needs are our priority.  As part of our continuing mission to provide you with exceptional heart care, our providers are all part of one team.  This team includes your primary Cardiologist (physician) and Advanced Practice Providers or APPs (Physician Assistants and Nurse Practitioners) who all work together to provide you with the care you need, when you need it.  Your next appointment:   July 23rd at 11:40 AM   Provider:   Ole Holts, MD    We recommend signing up for the patient portal called MyChart.  Sign up information is provided on this After Visit Summary.  MyChart is used to connect with patients for Virtual Visits (Telemedicine).  Patients are able to view lab/test results, encounter notes, upcoming appointments, etc.  Non-urgent messages can be sent to your provider as well.   To learn more about what you can do with MyChart, go to [email or web address].   Other Instructions  Implantable Loop Recorder Placement, Care After This sheet gives you information about how to care for yourself after your procedure. Your health care provider may also give you more specific instructions. If you have problems or questions, contact your health care provider. What can I expect after the procedure? After the procedure, it is common to have: Soreness or discomfort near the incision. Some swelling or bruising near the incision.  Follow these instructions at home: Incision care  Monitor your cardiac device site for redness, swelling, and drainage. Call the device clinic at [phone] if you experience these symptoms or fever/chills.  Keep the large square bandage on your site for 24 hours and then you may remove it yourself. Keep the steri-strips underneath  in place.   You may shower after 72 hours / 3 days from your procedure with the steri-strips in place. They will usually fall off on their own, or may be removed after 10 days. Pat dry.   Avoid lotions, ointments, or perfumes over your incision until it is well-healed.  Please do not submerge in water until your site is completely healed.   Your device is MRI compatible.   Remote monitoring is used to monitor your cardiac device from home. This monitoring is scheduled every month by our office. It allows us  to keep an eye on the function of your device to ensure it is working properly.  If your wound site starts to bleed apply pressure.    For help with the monitor please call [employer] Monitor Support Specialist directly at [phone].    If you have any questions/concerns please call the device clinic at [phone].  Activity  Return to your normal activities.  General instructions Follow instructions from your health care provider about how to manage your implantable loop recorder and transmit the information. Learn how to activate a recording if this is necessary for your type of device. You may go through a metal detection gate, and you may let someone hold a metal detector over your chest. Show your ID card if needed. Do not have an MRI unless you check with your health care provider first. Take over-the-counter and prescription medicines only as told by your health care provider. Keep all follow-up visits as told by your health care provider. This is important. Contact a health care provider if: You have redness,  swelling, or pain around your incision. You have a fever. You have pain that is not relieved by your pain medicine. You have triggered your device because of fainting (syncope) or because of a heartbeat that feels like it is racing, slow, fluttering, or skipping (palpitations). Get help right away if you have: Chest pain. Difficulty breathing. Summary After  the procedure, it is common to have soreness or discomfort near the incision. Change your dressing as told by your health care provider. Follow instructions from your health care provider about how to manage your implantable loop recorder and transmit the information. Keep all follow-up visits as told by your health care provider. This is important. This information is not intended to replace advice given to you by your health care provider. Make sure you discuss any questions you have with your health care provider. Document Released: 07/06/2015 Document Revised: 09/09/2017 Document Reviewed: 09/09/2017 Elsevier Patient Education  2020 [employer].

## 2024-02-07 ENCOUNTER — Ambulatory Visit: Attending: Physician Assistant | Admitting: Physical Therapy

## 2024-02-07 ENCOUNTER — Encounter: Admitting: Speech Pathology

## 2024-02-07 ENCOUNTER — Encounter: Payer: Self-pay | Admitting: Physical Therapy

## 2024-02-07 DIAGNOSIS — R262 Difficulty in walking, not elsewhere classified: Secondary | ICD-10-CM | POA: Diagnosis present

## 2024-02-07 DIAGNOSIS — R2681 Unsteadiness on feet: Secondary | ICD-10-CM | POA: Insufficient documentation

## 2024-02-07 DIAGNOSIS — M6281 Muscle weakness (generalized): Secondary | ICD-10-CM | POA: Insufficient documentation

## 2024-02-07 DIAGNOSIS — R278 Other lack of coordination: Secondary | ICD-10-CM | POA: Insufficient documentation

## 2024-02-07 NOTE — Therapy (Signed)
 OUTPATIENT PHYSICAL THERAPY NEURO TREATMENT   Patient Name: [name] [name] MRN: [id number] DOB:03-20-1941, 83 y.o., male Today's Date: 02/07/2024   PCP: Rudy Alyce RAMAN, MD REFERRING PROVIDER:   Pegge Toribio PARAS, PA-C    END OF SESSION:  PT End of Session - 02/07/24 1118     Visit Number 17 (P)     Number of Visits 25 (P)     Date for PT Re-Evaluation 02/29/24 (P)     PT Start Time 1106 (P)     PT Stop Time 1145 (P)     PT Time Calculation (min) 39 min (P)     Equipment Utilized During Treatment Gait belt (P)     Activity Tolerance Patient tolerated treatment well (P)     Behavior During Therapy Flat affect (P)                 Past Medical History:  Diagnosis Date   Arthritis    Depression    Diabetes mellitus without complication (HCC)    Gout    Hyperlipidemia    Hypertension    Ischemic cardiomyopathy    History reviewed. No pertinent surgical history. 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   Ischemic cardiomyopathy 06/12/2018   Thrombocytosis 04/16/2016   Adjustment reaction with prolonged depressive reaction 01/22/2014   Visual loss, right eye 07/30/2012   Type 2 diabetes mellitus (HCC) 06/16/2011   Coronary artery disease 06/16/2011   Hypertension associated with diabetes (HCC) 06/16/2011    ONSET DATE: 11/16/2023  REFERRING DIAG: P36.487 (ICD-10-CM) - Acute ischemic left MCA stroke (HCC)   THERAPY DIAG:  Muscle weakness (generalized)  Difficulty in walking, not elsewhere classified  Unsteadiness on feet  Rationale for Evaluation and Treatment: Rehabilitation  SUBJECTIVE:                                                                                                                                                                                             SUBJECTIVE STATEMENT:    No falls no pain. Pt reports he wants to get his legs stronger and that he should be doing his HEP more often. Stating he does it about 2x a week.    From eval: Pt is a pleasant 83 y/o male referred to PT s/p L MCA stroke. Pt also seeing speech as well. Pt with very weak voice, at times hx difficult to take as a result. He attempted to ambulate to clinic with 4WW but became  too fatigued and required assistance into WC. Pt now presents to PT eval in WC. He reports he primarily uses a 4WW at home, but does not ambulate much during the day (maybe ten minutes total). He reports difficulty with standing up from chairs. He must use grab bars in his bathroom due to difficulty standing up from toilet. He has difficulty with stairs, requires help getting up step to enter his home. He reports no recent falls, but that he does stumble with his 4WW when fatigued.  Pt accompanied by: self  PERTINENT HISTORY:  Via ED d/c note: Presented 11/16/2023 to [facility] with right facial droop left gaze preference and dysarthria. CT/MRI showed small acute nonhemorrhagic left MCA distribution infarction involving the left frontal lobe. No associated mass effect. Patient did not receive TNK. CTA showed no large vessel occlusion or evidence of finding amenable to neurovascular intervention...   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 No none currently but does report arthritis pain in L shoulder limiting ability to lift/raise arm  PRECAUTIONS: Fall  RED FLAGS: None   WEIGHT BEARING RESTRICTIONS: No  FALLS: Has patient fallen in last 6 months? No but does report stumbling with fatigue  LIVING ENVIRONMENT: Lives with: lives with their family son and DIL Lives in: House/apartment, two level home but not using upstairs, stays on first floor Stairs: 1 step to enter  home, says no handrails but he has help getting up step Has following equipment at home: Walker - 4 wheeled, shower chair, and Grab bars  PLOF: Independent  PATIENT GOALS: everything: walking, balance, strength   OBJECTIVE:  Note: Objective measures were completed at Evaluation unless otherwise noted.  DIAGNOSTIC FINDINGS:  imaging via chart  MR brain 11/16/23:  IMPRESSION: 1. Small acute nonhemorrhagic left MCA distribution infarct involving the left frontal lobe. No associated mass effect. 2. Additional punctate subcentimeter acute ischemic nonhemorrhagic infarct within the contralateral right basal ganglia. 3. Underlying moderately advanced chronic microvascular ischemic disease with a few scattered remote lacunar infarcts as above. 4. Chronic occlusion of the left vertebral artery.     Electronically Signed   By: Morene Hoard M.D.   On: 11/16/2023 23:48  CT ANGIO HEAD NECK 11/16/23  IMPRESSION: No large vessel occlusion or evidence of findings amenable to neurovascular intervention.   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   Multiple intracranial vascular stenoses as above. Focal short segment occlusion of an M2 inferior division branch of the right MCA with reconstitution noted.   Mild-to-moderate stenosis of the V4 segment right vertebral artery.   Emphysema (ICD10-J43.9).     Electronically Signed   By: Donnice Mania M.D.   On: 11/16/2023 15:50  CT HEAD 11/16/23: IMPRESSION: 1. 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 2. Subtle hypoattenuation at the anterior limb of the right internal capsule. 3. 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 4. Age indeterminate infarct in the left lentiform  nucleus. 5. Moderate atrophy and white matter disease likely reflects the sequela of chronic microvascular ischemia. 6. No acute hemorrhage or mass lesion.   These results were called by telephone at the time of interpretation on  11/16/2023 at 3:13 pm to provider Dr. Matthews, who verbally acknowledged these results.     Electronically Signed   By: Lonni Necessary M.D.   On: 11/16/2023 15:14  COGNITION: Overall cognitive status: Within functional limits for tasks assessed but at times difficult to fully determine due to weakness of voice and some difficulty responding to PT questions as a result   SENSATION: WFL to light touch bilat UE and bilat LE  COORDINATION: WFL rapid alt movement UE and finger chin<>target bilat  WFL rapid alt movement LE and heel>shin bilat  EDEMA:  Pt reports no swelling     POSTURE: slight rounded shoulders, forward head, and increased thoracic kyphosis   LOWER EXTREMITY MMT:    Gross bilat LE strength is 4/5, more weakness found proximal than distal   BED MOBILITY:  Not assessed  TRANSFERS: Initially min a, then pt able to complete CGA   STAIRS: Not assessed but impaired per pt report GAIT: Findings: decreased gait speed (See ), FWD flexed posture with gait, decreased bilat heel strike, more pronounced on R than L side  FUNCTIONAL TESTS:  5 times sit to stand: 44.2 sec with use UE  Timed up and go (TUG): 33 second with 4WW  10 meter walk test: 0.53 m/s with 4WW  Berg Balance Scale: deferred : deferred  PATIENT SURVEYS:  SIS-16: 54                                                                                                                              TREATMENT DATE:   Unless otherwise stated, CGA was provided and gait belt donned in order to ensure pt safety   TA- To improve functional movements patterns for everyday tasks   In // bars: Hurdles with 4 foam rollers. VC for big step length and step height. 3# AW.  3 laps   STS - 3x5-  airex on chair - pt had difficulty obtaining full standing position on 1st set, requiring min A from PT as well as verbal cues for anterior weight shift and to squeeze glutes during stand. Progressed to CGA for 2nd and 3rd sets with less difficulty.   TE- To improve strength, endurance, mobility, and function of specific targeted muscle groups or improve joint range of motion or improve muscle flexibility   Standing HS flexion 3# aw at safety bar 2x10 BLE   Seated: - Marches w/ 3# AW 2x30 - LAQ w/ 3# AW 2x10 -Isometric hip Abduction 3x30  Gait Training  200 ft in hallway with 4WW. VC to increase step height when he hears himself shuffle his feet.    PATIENT EDUCATION: Education details: Pt educated throughout session about proper posture and technique with exercises. Improved exercise technique, movement at target joints, use of target muscles after min to mod verbal, visual, tactile cues.  Person educated: Patient Education method: Explanation, Demonstration, and Verbal cues Education comprehension: verbalized understanding and returned demonstration  HOME EXERCISE  PROGRAM: Access Code: [email or web address] URL: [email or web address] Date: 01/30/2024 Prepared by: Massie Dollar  Exercises - Seated March  - 1 x daily - 7 x weekly - 2 sets - 10 reps - Seated Long Arc Quad  - 1 x daily - 7 x weekly - 2 sets - 10 reps - 3 sec hold - Standing Knee Flexion AROM with Chair Support  - 1 x daily - 7 x weekly - 2 sets - 10 reps - Side Stepping with Counter Support  - 1 x daily - 7 x weekly - 3 sets - 10 reps - Sit to Stand with Armchair  - 1 x daily - 7 x weekly - 3 sets - 10 reps - Walking with Counter Support  - 1 x daily - 7 x weekly - 3 sets - 10 reps - Semi-Tandem Balance at [employer] Open  - 1 x daily - 7 x weekly - 2 sets - 4 reps - 15 seconds hold - Standing March with Counter Support  - 1 x daily - 7 x weekly - 3 sets - 10 reps  GOALS: Goals  reviewed with patient? Yes  SHORT TERM GOALS: Target date: 01/18/2024  Patient will be independent in home exercise program to improve strength/mobility for better functional independence with ADLs. Baseline: 01/15/2024= Patient able to verbalize and demonstrate his seated HEP without prompting- states compliance.  Goal status: MET   LONG TERM GOALS: Target date:02/29/2024  Patient will increase SIS-16 score by at least 10 points  to demonstrate increased ease with ADLs and quality of life.  Baseline: 54 Goal status: INITIAL  2.  Patient (> 39 years old) will complete five times sit to stand test in < 15 seconds indicating an increased LE strength and improved balance. Baseline: 44.2 sec with use of BUE; 01/15/2024= 36.46 sec with BUE Support (Still unable to rise without UE Support and some posterior lean- CGA)  Goal status: PROGRESSING   3.  Patient will increase Berg Balance score by > 6 points to demonstrate decreased fall risk during functional activities Baseline: 34 Goal status: INITIAL  4.  Patient will increase 10 meter walk test to >1.63m/s as to improve gait speed for better community ambulation and to reduce fall risk. Baseline: 0.53 m/s with 4WW; 01/15/2024= 0.58 m/s with 4WW Goal status: PROGRESSING  5.  Patient will reduce timed up and go to <11 seconds to reduce fall risk and demonstrate improved transfer/gait ability. Baseline: 33 sec with 4WW; 01/15/2024= 25.42 sec avg with 4WW Goal status: PROGRESSING  6. Patient will increase six minute walk test distance to >1000 for progression to community ambulator and improve gait ability Baseline: 310 feet using a 4WW with CGA and only completes 3:17 sec prior to requesting to sit secondary to fatigue; 01/15/2024= 610 feet in with 4WW Goal status: PROGRESSING   ASSESSMENT:  CLINICAL IMPRESSION: Pt tolerated increase weight with strength training today and increased reps with STS. Focused on foot clearance this session with  hurdles and AW donned. Pt displayed bigger step length after removing AW during gait training but still had occasional shuffle. Pt initially struggled with STS and could not achieve more than 60 degrees of leg extension during stand. Cues for weight shifting greatly aided pts attempt to STS AEB his need for less assistance from SPT. Pt will continue to benefit from skilled physical therapy intervention to address impairments, improve QOL, and attain therapy goals.     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   REHAB POTENTIAL: Good  CLINICAL DECISION MAKING: Evolving/moderate complexity  EVALUATION COMPLEXITY: Moderate  PLAN:  PT FREQUENCY: 1-2x/week  PT DURATION: 12 weeks  PLANNED INTERVENTIONS: [id number]- PT Re-evaluation, 97750- Physical Performance Testing, 97110-Therapeutic exercises, 97530- Therapeutic activity, [id number]- Neuromuscular re-education, 97535- Self Care, [id number]- Manual therapy, [id number]- Gait training, [phone]- Orthotic Initial, [phone]- Orthotic/Prosthetic subsequent, [phone]- Canalith repositioning, Patient/Family education, Balance training, Stair training, Taping, Joint mobilization, Spinal mobilization, Vestibular training, DME instructions, Wheelchair mobility training, Cryotherapy, and Moist heat  PLAN FOR NEXT SESSION:    Progress gait with least restrict assistive device  Improve dynamic balance including narrowed BOS Progress overall LE strength  Progress HEP as indicated.    Casilda Human, [place]     1:29 PM 02/07/24

## 2024-02-12 ENCOUNTER — Encounter: Admitting: Speech Pathology

## 2024-02-12 ENCOUNTER — Ambulatory Visit

## 2024-02-12 DIAGNOSIS — M6281 Muscle weakness (generalized): Secondary | ICD-10-CM

## 2024-02-12 DIAGNOSIS — R2681 Unsteadiness on feet: Secondary | ICD-10-CM

## 2024-02-12 DIAGNOSIS — R278 Other lack of coordination: Secondary | ICD-10-CM

## 2024-02-12 DIAGNOSIS — R262 Difficulty in walking, not elsewhere classified: Secondary | ICD-10-CM

## 2024-02-12 NOTE — Therapy (Addendum)
 OUTPATIENT PHYSICAL THERAPY NEURO TREATMENT   Patient Name: [name] [name] MRN: [id number] DOB:05-Jul-1941, 83 y.o., male Today's Date: 02/12/2024   PCP: Rudy Alyce RAMAN, MD REFERRING PROVIDER:   Pegge Toribio PARAS, PA-C    END OF SESSION:  PT End of Session - 02/12/24 1020     Visit Number 18    Number of Visits 25    Date for PT Re-Evaluation 02/29/24    Progress Note Due on Visit 20    PT Start Time 1016    PT Stop Time 1059    PT Time Calculation (min) 43 min    Equipment Utilized During Treatment Gait belt    Activity Tolerance Patient tolerated treatment well    Behavior During Therapy Flat affect                Past Medical History:  Diagnosis Date   Arthritis    Depression    Diabetes mellitus without complication (HCC)    Gout    Hyperlipidemia    Hypertension    Ischemic cardiomyopathy    History reviewed. No pertinent surgical history. 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   Ischemic cardiomyopathy 06/12/2018   Thrombocytosis 04/16/2016   Adjustment reaction with prolonged depressive reaction 01/22/2014   Visual loss, right eye 07/30/2012   Type 2 diabetes mellitus (HCC) 06/16/2011   Coronary artery disease 06/16/2011   Hypertension associated with diabetes (HCC) 06/16/2011    ONSET DATE: 11/16/2023  REFERRING DIAG: P36.487 (ICD-10-CM) - Acute ischemic left MCA stroke (HCC)   THERAPY DIAG:  Muscle weakness (generalized)  Difficulty in walking, not elsewhere classified  Unsteadiness on feet  Other lack of coordination  Rationale for Evaluation and Treatment: Rehabilitation  SUBJECTIVE:                                                                                                                                                                                              SUBJECTIVE STATEMENT:   States consistently using his walker and denies any pan and no recent falls. Later in session report some difficulty with bed mobility.    From eval: Pt is a pleasant 83 y/o male referred to PT s/p L MCA stroke. Pt also seeing speech as well. Pt with very weak voice, at times hx difficult to take as a result. He attempted to ambulate to clinic with 4WW but became too fatigued and required assistance into WC. Pt now presents to PT eval  in WC. He reports he primarily uses a 4WW at home, but does not ambulate much during the day (maybe ten minutes total). He reports difficulty with standing up from chairs. He must use grab bars in his bathroom due to difficulty standing up from toilet. He has difficulty with stairs, requires help getting up step to enter his home. He reports no recent falls, but that he does stumble with his 4WW when fatigued.  Pt accompanied by: self  PERTINENT HISTORY:  Via ED d/c note: Presented 11/16/2023 to [facility] with right facial droop left gaze preference and dysarthria. CT/MRI showed small acute nonhemorrhagic left MCA distribution infarction involving the left frontal lobe. No associated mass effect. Patient did not receive TNK. CTA showed no large vessel occlusion or evidence of finding amenable to neurovascular intervention...   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 No none currently but does report arthritis pain in L shoulder limiting ability to lift/raise arm  PRECAUTIONS: Fall  RED FLAGS: None   WEIGHT BEARING RESTRICTIONS: No  FALLS: Has patient fallen in last 6 months? No but does report stumbling with fatigue  LIVING ENVIRONMENT: Lives with: lives with their family son and DIL Lives in: House/apartment, two level home but not using upstairs, stays on first floor Stairs: 1  step to enter home, says no handrails but he has help getting up step Has following equipment at home: Walker - 4 wheeled, shower chair, and Grab bars  PLOF: Independent  PATIENT GOALS: everything: walking, balance, strength   OBJECTIVE:  Note: Objective measures were completed at Evaluation unless otherwise noted.  DIAGNOSTIC FINDINGS:  imaging via chart  MR brain 11/16/23:  IMPRESSION: 1. Small acute nonhemorrhagic left MCA distribution infarct involving the left frontal lobe. No associated mass effect. 2. Additional punctate subcentimeter acute ischemic nonhemorrhagic infarct within the contralateral right basal ganglia. 3. Underlying moderately advanced chronic microvascular ischemic disease with a few scattered remote lacunar infarcts as above. 4. Chronic occlusion of the left vertebral artery.     Electronically Signed   By: Morene Hoard M.D.   On: 11/16/2023 23:48  CT ANGIO HEAD NECK 11/16/23  IMPRESSION: No large vessel occlusion or evidence of findings amenable to neurovascular intervention.   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   Multiple intracranial vascular stenoses as above. Focal short segment occlusion of an M2 inferior division branch of the right MCA with reconstitution noted.   Mild-to-moderate stenosis of the V4 segment right vertebral artery.   Emphysema (ICD10-J43.9).     Electronically Signed   By: Donnice Mania M.D.   On: 11/16/2023 15:50  CT HEAD 11/16/23: IMPRESSION: 1. 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 2. Subtle hypoattenuation at the anterior limb of the right internal capsule. 3. 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 4. Age indeterminate infarct in the left  lentiform nucleus. 5. Moderate atrophy and white matter disease likely reflects the sequela of chronic microvascular ischemia. 6. No acute hemorrhage or mass lesion.   These results were called by telephone at the time of interpretation on 11/16/2023 at 3:13 pm to provider Dr. Matthews, who verbally acknowledged these results.  Electronically Signed   By: Lonni Necessary M.D.   On: 11/16/2023 15:14  COGNITION: Overall cognitive status: Within functional limits for tasks assessed but at times difficult to fully determine due to weakness of voice and some difficulty responding to PT questions as a result   SENSATION: WFL to light touch bilat UE and bilat LE  COORDINATION: WFL rapid alt movement UE and finger chin<>target bilat  WFL rapid alt movement LE and heel>shin bilat  EDEMA:  Pt reports no swelling     POSTURE: slight rounded shoulders, forward head, and increased thoracic kyphosis   LOWER EXTREMITY MMT:    Gross bilat LE strength is 4/5, more weakness found proximal than distal   BED MOBILITY:  Not assessed  TRANSFERS: Initially min a, then pt able to complete CGA   STAIRS: Not assessed but impaired per pt report GAIT: Findings: decreased gait speed (See ), FWD flexed posture with gait, decreased bilat heel strike, more pronounced on R than L side  FUNCTIONAL TESTS:  5 times sit to stand: 44.2 sec with use UE  Timed up and go (TUG): 33 second with 4WW  10 meter walk test: 0.53 m/s with 4WW  Berg Balance Scale: deferred : deferred  PATIENT SURVEYS:  SIS-16: 54                                                                                                                              TREATMENT DATE:   Unless otherwise stated, CGA was provided and gait belt donned in order to ensure pt safety   TA- To improve functional movements patterns for everyday tasks   In // bars: Lateral step up/over 1/2 foam roll with 3# AW x 12 reps each  direction  Hurdles with 4 foam rollers. VC for big step length and step height. 3# AW. 3 laps   STS - attempted without UE support- unable- did perform 5 reps with 1UE support- VC for anterior weight shift and to squeeze glutes during stand.  Moved over to mat table and raised to 25 height x 5 reps without UE Support and then 23 in height x 5 reps without UE support  TE- To improve strength, endurance, mobility, and function of specific targeted muscle groups or improve joint range of motion or improve muscle flexibility     Leg press:  40# 2 x 10 reps  Seated: - Marches w/ 3# AW 2x12 - LAQ w/ 3# AW 2x12 -Seated ham curls with BTB 2 x 10 reps   Gait Training  300 ft in hallway with 4WW. VC for posture and to increase step height when he hears himself shuffle his feet.    PATIENT EDUCATION: Education details: Pt educated throughout session about proper posture and technique with exercises. Improved exercise technique, movement at target joints, use of target muscles after min to mod verbal, visual, tactile cues.  Person educated: Patient Education method: Explanation, Demonstration, and Verbal cues Education  comprehension: verbalized understanding and returned demonstration  HOME EXERCISE PROGRAM: Access Code: [email or web address] URL: [email or web address] Date: 01/30/2024 Prepared by: Massie Dollar  Exercises - Seated March  - 1 x daily - 7 x weekly - 2 sets - 10 reps - Seated Long Arc Quad  - 1 x daily - 7 x weekly - 2 sets - 10 reps - 3 sec hold - Standing Knee Flexion AROM with Chair Support  - 1 x daily - 7 x weekly - 2 sets - 10 reps - Side Stepping with Counter Support  - 1 x daily - 7 x weekly - 3 sets - 10 reps - Sit to Stand with Armchair  - 1 x daily - 7 x weekly - 3 sets - 10 reps - Walking with Counter Support  - 1 x daily - 7 x weekly - 3 sets - 10 reps - Semi-Tandem Balance at [employer] Open  - 1 x daily - 7 x weekly - 2 sets - 4 reps - 15 seconds  hold - Standing March with Counter Support  - 1 x daily - 7 x weekly - 3 sets - 10 reps  GOALS: Goals reviewed with patient? Yes  SHORT TERM GOALS: Target date: 01/18/2024  Patient will be independent in home exercise program to improve strength/mobility for better functional independence with ADLs. Baseline: 01/15/2024= Patient able to verbalize and demonstrate his seated HEP without prompting- states compliance.  Goal status: MET   LONG TERM GOALS: Target date:02/29/2024  Patient will increase SIS-16 score by at least 10 points  to demonstrate increased ease with ADLs and quality of life.  Baseline: 54 Goal status: INITIAL  2.  Patient (> 68 years old) will complete five times sit to stand test in < 15 seconds indicating an increased LE strength and improved balance. Baseline: 44.2 sec with use of BUE; 01/15/2024= 36.46 sec with BUE Support (Still unable to rise without UE Support and some posterior lean- CGA)  Goal status: PROGRESSING   3.  Patient will increase Berg Balance score by > 6 points to demonstrate decreased fall risk during functional activities Baseline: 34 Goal status: INITIAL  4.  Patient will increase 10 meter walk test to >1.41m/s as to improve gait speed for better community ambulation and to reduce fall risk. Baseline: 0.53 m/s with 4WW; 01/15/2024= 0.58 m/s with 4WW Goal status: PROGRESSING  5.  Patient will reduce timed up and go to <11 seconds to reduce fall risk and demonstrate improved transfer/gait ability. Baseline: 33 sec with 4WW; 01/15/2024= 25.42 sec avg with 4WW Goal status: PROGRESSING  6. Patient will increase six minute walk test distance to >1000 for progression to community ambulator and improve gait ability Baseline: 310 feet using a 4WW with CGA and only completes 3:17 sec prior to requesting to sit secondary to fatigue; 01/15/2024= 610 feet in with 4WW Goal status: PROGRESSING   ASSESSMENT:  CLINICAL IMPRESSION: Patient presented with good  overall motivation- more conversational overall today- responded well to verbal cues for step length and posture with no LOB or significant gait abnormalities. He continues to struggle with LE muscle weakness with sit to stand and performed better with increased height to allow more LE emphasis. Pt will continue to benefit from skilled physical therapy intervention to address impairments, improve QOL, and attain therapy goals.     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   REHAB POTENTIAL: Good  CLINICAL DECISION MAKING: Evolving/moderate complexity  EVALUATION COMPLEXITY: Moderate  PLAN:  PT FREQUENCY: 1-2x/week  PT DURATION: 12 weeks  PLANNED INTERVENTIONS: [id number]- PT Re-evaluation, 97750- Physical Performance Testing, 97110-Therapeutic exercises, 97530- Therapeutic activity, [id number]- Neuromuscular re-education, 97535- Self Care, [id number]- Manual therapy, [id number]- Gait training, [phone]- Orthotic Initial, [phone]- Orthotic/Prosthetic subsequent, [phone]- Canalith repositioning, Patient/Family education, Balance training, Stair training, Taping, Joint mobilization, Spinal mobilization, Vestibular training, DME instructions, Wheelchair mobility training, Cryotherapy, and Moist heat  PLAN FOR NEXT SESSION:    Progress gait with least restrict assistive device  Improve dynamic balance including narrowed BOS Progress overall LE strength  Progress HEP as indicated.    Chyrl London, PT  Physical Therapist [place] [facility]- Outpatient [phone]   11:18 AM 02/12/24

## 2024-02-14 ENCOUNTER — Encounter: Admitting: Speech Pathology

## 2024-02-14 ENCOUNTER — Ambulatory Visit

## 2024-02-14 DIAGNOSIS — R278 Other lack of coordination: Secondary | ICD-10-CM

## 2024-02-14 DIAGNOSIS — M6281 Muscle weakness (generalized): Secondary | ICD-10-CM | POA: Diagnosis not present

## 2024-02-14 DIAGNOSIS — R2681 Unsteadiness on feet: Secondary | ICD-10-CM

## 2024-02-14 DIAGNOSIS — R262 Difficulty in walking, not elsewhere classified: Secondary | ICD-10-CM

## 2024-02-14 NOTE — Therapy (Signed)
 OUTPATIENT PHYSICAL THERAPY NEURO TREATMENT   Patient Name: [name] [name] MRN: [id number] DOB:Jan 01, 1941, 83 y.o., male Today's Date: 02/14/2024   PCP: Rudy Alyce RAMAN, MD REFERRING PROVIDER:   Pegge Toribio PARAS, PA-C    END OF SESSION:  PT End of Session - 02/14/24 1020     Visit Number 19    Number of Visits 25    Date for PT Re-Evaluation 02/29/24    Progress Note Due on Visit 20    PT Start Time 1015    Equipment Utilized During Treatment Gait belt    Activity Tolerance Patient tolerated treatment well    Behavior During Therapy Flat affect                Past Medical History:  Diagnosis Date   Arthritis    Depression    Diabetes mellitus without complication (HCC)    Gout    Hyperlipidemia    Hypertension    Ischemic cardiomyopathy    History reviewed. No pertinent surgical history. 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   Ischemic cardiomyopathy 06/12/2018   Thrombocytosis 04/16/2016   Adjustment reaction with prolonged depressive reaction 01/22/2014   Visual loss, right eye 07/30/2012   Type 2 diabetes mellitus (HCC) 06/16/2011   Coronary artery disease 06/16/2011   Hypertension associated with diabetes (HCC) 06/16/2011    ONSET DATE: 11/16/2023  REFERRING DIAG: P36.487 (ICD-10-CM) - Acute ischemic left MCA stroke (HCC)   THERAPY DIAG:  Muscle weakness (generalized)  Difficulty in walking, not elsewhere classified  Unsteadiness on feet  Other lack of coordination  Rationale for Evaluation and Treatment: Rehabilitation  SUBJECTIVE:                                                                                                                                                                                             SUBJECTIVE STATEMENT:   States consistently using his walker and  denies any pan and no recent falls. Later in session report some difficulty with bed mobility.    From eval: Pt is a pleasant 83 y/o male referred to PT s/p L MCA stroke. Pt also seeing speech as well. Pt with very weak voice, at times hx difficult to take as a result. He attempted to ambulate to clinic with 4WW but became too fatigued and required assistance into WC. Pt now presents to PT eval in WC. He reports he primarily uses a 4WW at home, but does not ambulate much  during the day (maybe ten minutes total). He reports difficulty with standing up from chairs. He must use grab bars in his bathroom due to difficulty standing up from toilet. He has difficulty with stairs, requires help getting up step to enter his home. He reports no recent falls, but that he does stumble with his 4WW when fatigued.  Pt accompanied by: self  PERTINENT HISTORY:  Via ED d/c note: Presented 11/16/2023 to [facility] with right facial droop left gaze preference and dysarthria. CT/MRI showed small acute nonhemorrhagic left MCA distribution infarction involving the left frontal lobe. No associated mass effect. Patient did not receive TNK. CTA showed no large vessel occlusion or evidence of finding amenable to neurovascular intervention...   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 No none currently but does report arthritis pain in L shoulder limiting ability to lift/raise arm  PRECAUTIONS: Fall  RED FLAGS: None   WEIGHT BEARING RESTRICTIONS: No  FALLS: Has patient fallen in last 6 months? No but does report stumbling with fatigue  LIVING ENVIRONMENT: Lives with: lives with their family son and DIL Lives in: House/apartment, two level home but not using upstairs, stays on first floor Stairs: 1 step to enter home, says no handrails but he has help getting up  step Has following equipment at home: Walker - 4 wheeled, shower chair, and Grab bars  PLOF: Independent  PATIENT GOALS: everything: walking, balance, strength   OBJECTIVE:  Note: Objective measures were completed at Evaluation unless otherwise noted.  DIAGNOSTIC FINDINGS:  imaging via chart  MR brain 11/16/23:  IMPRESSION: 1. Small acute nonhemorrhagic left MCA distribution infarct involving the left frontal lobe. No associated mass effect. 2. Additional punctate subcentimeter acute ischemic nonhemorrhagic infarct within the contralateral right basal ganglia. 3. Underlying moderately advanced chronic microvascular ischemic disease with a few scattered remote lacunar infarcts as above. 4. Chronic occlusion of the left vertebral artery.     Electronically Signed   By: Morene Hoard M.D.   On: 11/16/2023 23:48  CT ANGIO HEAD NECK 11/16/23  IMPRESSION: No large vessel occlusion or evidence of findings amenable to neurovascular intervention.   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   Multiple intracranial vascular stenoses as above. Focal short segment occlusion of an M2 inferior division branch of the right MCA with reconstitution noted.   Mild-to-moderate stenosis of the V4 segment right vertebral artery.   Emphysema (ICD10-J43.9).     Electronically Signed   By: Donnice Mania M.D.   On: 11/16/2023 15:50  CT HEAD 11/16/23: IMPRESSION: 1. 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 2. Subtle hypoattenuation at the anterior limb of the right internal capsule. 3. 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 4. Age indeterminate infarct in the left lentiform nucleus. 5. Moderate atrophy and white matter disease  likely reflects the sequela of chronic microvascular ischemia. 6. No acute hemorrhage or mass lesion.   These results were called by telephone at the time of interpretation on 11/16/2023 at 3:13 pm to provider Dr. Matthews, who verbally acknowledged these results.     Electronically Signed   By: Lonni Necessary M.D.   On:  11/16/2023 15:14  COGNITION: Overall cognitive status: Within functional limits for tasks assessed but at times difficult to fully determine due to weakness of voice and some difficulty responding to PT questions as a result   SENSATION: WFL to light touch bilat UE and bilat LE  COORDINATION: WFL rapid alt movement UE and finger chin<>target bilat  WFL rapid alt movement LE and heel>shin bilat  EDEMA:  Pt reports no swelling     POSTURE: slight rounded shoulders, forward head, and increased thoracic kyphosis   LOWER EXTREMITY MMT:    Gross bilat LE strength is 4/5, more weakness found proximal than distal   BED MOBILITY:  Not assessed  TRANSFERS: Initially min a, then pt able to complete CGA   STAIRS: Not assessed but impaired per pt report GAIT: Findings: decreased gait speed (See ), FWD flexed posture with gait, decreased bilat heel strike, more pronounced on R than L side  FUNCTIONAL TESTS:  5 times sit to stand: 44.2 sec with use UE  Timed up and go (TUG): 33 second with 4WW  10 meter walk test: 0.53 m/s with 4WW  Berg Balance Scale: deferred : deferred  PATIENT SURVEYS:  SIS-16: 54                                                                                                                              TREATMENT DATE:   Unless otherwise stated, CGA was provided and gait belt donned in order to ensure pt safety   TA- To improve functional movements patterns for everyday tasks    Nustep Interval BUE/LE Support - 5 min total L1-4   STS - with airex pad under chair  Resistive gait with 4WW and using 4# AW ea LE- 300  feet then 1 min rest then another 150 feet with min cues to pick feet up.   TE- To improve strength, endurance, mobility, and function of specific targeted muscle groups or improve joint range of motion or improve muscle flexibility     Leg press:  40# 3 x 10 reps  Calf press  40# 3 x 10 reps     PATIENT EDUCATION: Education details: Pt educated throughout session about proper posture and technique with exercises. Improved exercise technique, movement at target joints, use of target muscles after min to mod verbal, visual, tactile cues.  Person educated: Patient Education method: Explanation, Demonstration, and Verbal cues Education comprehension: verbalized understanding and returned demonstration  HOME EXERCISE PROGRAM: Access Code: [email or web address] URL: [email or web address] Date: 01/30/2024 Prepared by: Massie Dollar  Exercises - Seated March  - 1 x daily - 7 x weekly - 2 sets - 10 reps - Seated Long Arc Quad  - 1 x daily - 7 x weekly - 2 sets - 10 reps - 3 sec hold - Standing Knee Flexion AROM with Chair Support  - 1 x daily - 7 x weekly - 2 sets - 10 reps - Side Stepping with Counter  Support  - 1 x daily - 7 x weekly - 3 sets - 10 reps - Sit to Stand with Armchair  - 1 x daily - 7 x weekly - 3 sets - 10 reps - Walking with Counter Support  - 1 x daily - 7 x weekly - 3 sets - 10 reps - Semi-Tandem Balance at [employer] Eyes Open  - 1 x daily - 7 x weekly - 2 sets - 4 reps - 15 seconds hold - Standing March with Counter Support  - 1 x daily - 7 x weekly - 3 sets - 10 reps  GOALS: Goals reviewed with patient? Yes  SHORT TERM GOALS: Target date: 01/18/2024  Patient will be independent in home exercise program to improve strength/mobility for better functional independence with ADLs. Baseline: 01/15/2024= Patient able to verbalize and demonstrate his seated HEP without prompting- states compliance.  Goal status: MET   LONG TERM GOALS: Target  date:02/29/2024  Patient will increase SIS-16 score by at least 10 points  to demonstrate increased ease with ADLs and quality of life.  Baseline: 54 Goal status: INITIAL  2.  Patient (> 10 years old) will complete five times sit to stand test in < 15 seconds indicating an increased LE strength and improved balance. Baseline: 44.2 sec with use of BUE; 01/15/2024= 36.46 sec with BUE Support (Still unable to rise without UE Support and some posterior lean- CGA)  Goal status: PROGRESSING   3.  Patient will increase Berg Balance score by > 6 points to demonstrate decreased fall risk during functional activities Baseline: 34 Goal status: INITIAL  4.  Patient will increase 10 meter walk test to >1.11m/s as to improve gait speed for better community ambulation and to reduce fall risk. Baseline: 0.53 m/s with 4WW; 01/15/2024= 0.58 m/s with 4WW Goal status: PROGRESSING  5.  Patient will reduce timed up and go to <11 seconds to reduce fall risk and demonstrate improved transfer/gait ability. Baseline: 33 sec with 4WW; 01/15/2024= 25.42 sec avg with 4WW Goal status: PROGRESSING  6. Patient will increase six minute walk test distance to >1000 for progression to community ambulator and improve gait ability Baseline: 310 feet using a 4WW with CGA and only completes 3:17 sec prior to requesting to sit secondary to fatigue; 01/15/2024= 610 feet in with 4WW Goal status: PROGRESSING   ASSESSMENT:  CLINICAL IMPRESSION: Patient presented with good overall motivation- more conversational overall today- responded well to verbal cues for step length and posture with no LOB or significant gait abnormalities. He continues to struggle with LE muscle weakness with sit to stand and performed better with increased height to allow more LE emphasis. Pt will continue to benefit from skilled physical therapy intervention to address impairments, improve QOL, and attain therapy goals.     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   REHAB POTENTIAL: Good  CLINICAL DECISION MAKING: Evolving/moderate complexity  EVALUATION COMPLEXITY: Moderate  PLAN:  PT FREQUENCY: 1-2x/week  PT DURATION: 12 weeks  PLANNED INTERVENTIONS: [id number]- PT Re-evaluation, 97750- Physical Performance Testing, 97110-Therapeutic exercises, 97530- Therapeutic activity, [id number]- Neuromuscular re-education, 97535- Self Care, [id number]- Manual therapy, [id number]- Gait training, [phone]-  Orthotic Initial, 02236- Orthotic/Prosthetic subsequent, [phone]- Canalith repositioning, Patient/Family education, Balance training, Stair training, Taping, Joint mobilization, Spinal mobilization, Vestibular training, DME instructions, Wheelchair mobility training, Cryotherapy, and Moist heat  PLAN FOR NEXT SESSION:    Progress gait with least restrict assistive device  Improve dynamic balance including narrowed BOS Progress overall LE strength  Progress HEP as indicated.    Chyrl London, PT  Physical Therapist [facility]- Outpatient [phone]   10:21 AM 02/14/24

## 2024-02-15 ENCOUNTER — Ambulatory Visit: Attending: Cardiology

## 2024-02-15 ENCOUNTER — Telehealth: Payer: Self-pay

## 2024-02-15 DIAGNOSIS — I639 Cerebral infarction, unspecified: Secondary | ICD-10-CM

## 2024-02-15 NOTE — Telephone Encounter (Signed)
-----   Message from Suzann Riddle sent at 02/14/2024  4:37 PM EDT ----- Regarding: RE: llop implant Thank you for this info.   Mliss, please order a 2 week live monitor. Dr. Cindie to read.  Need to cancel the ILR implant appt until after the monitor results. ----- Message ----- From: Eveline Lucienne HERO Sent: 02/14/2024  10:45 AM EDT To: Mliss Hummer, LPN; Chantal Needle, NP Subject: llop implant                                   Good morning,  Patients insurance denied the loop implant scheduled on 07/23. They are requesting Monitor results prior to approval.   Thank you

## 2024-02-15 NOTE — Telephone Encounter (Signed)
 Called number on file- at last visit, requested to speak with son. Spoke with him advising of the following messages.   Will order 2 week live monitor- MD to read.  Then we can resubmit for the ILR.   They verbalized understanding.

## 2024-02-19 ENCOUNTER — Ambulatory Visit: Admitting: Physical Therapy

## 2024-02-19 ENCOUNTER — Encounter: Admitting: Speech Pathology

## 2024-02-19 DIAGNOSIS — R2681 Unsteadiness on feet: Secondary | ICD-10-CM

## 2024-02-19 DIAGNOSIS — M6281 Muscle weakness (generalized): Secondary | ICD-10-CM

## 2024-02-19 DIAGNOSIS — R262 Difficulty in walking, not elsewhere classified: Secondary | ICD-10-CM

## 2024-02-19 NOTE — Therapy (Signed)
 OUTPATIENT PHYSICAL THERAPY NEURO TREATMENT/ PHYSICAL THERAPY PROGRESS NOTE   Dates of reporting period  01/15/24   to   02/19/24    Patient Name: [name] [name] MRN: [id number] DOB:16-Oct-1940, 83 y.o., male Today's Date: 02/19/2024   PCP: Rudy Alyce RAMAN, MD REFERRING PROVIDER:   Pegge Toribio PARAS, PA-C    END OF SESSION:  PT End of Session - 02/19/24 1101     Visit Number 20    Number of Visits 25    Date for PT Re-Evaluation 02/29/24    Progress Note Due on Visit 20    PT Start Time 1100    PT Stop Time 1140    PT Time Calculation (min) 40 min    Equipment Utilized During Treatment Gait belt    Activity Tolerance Patient tolerated treatment well    Behavior During Therapy Flat affect                Past Medical History:  Diagnosis Date   Arthritis    Depression    Diabetes mellitus without complication (HCC)    Gout    Hyperlipidemia    Hypertension    Ischemic cardiomyopathy    No past surgical history on file. 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   Ischemic cardiomyopathy 06/12/2018   Thrombocytosis 04/16/2016   Adjustment reaction with prolonged depressive reaction 01/22/2014   Visual loss, right eye 07/30/2012   Type 2 diabetes mellitus (HCC) 06/16/2011   Coronary artery disease 06/16/2011   Hypertension associated with diabetes (HCC) 06/16/2011    ONSET DATE: 11/16/2023  REFERRING DIAG: P36.487 (ICD-10-CM) - Acute ischemic left MCA stroke (HCC)   THERAPY DIAG:  Muscle weakness (generalized)  Difficulty in walking, not elsewhere classified  Unsteadiness on feet  Rationale for Evaluation and Treatment: Rehabilitation  SUBJECTIVE:                                                                                                                                                                                              SUBJECTIVE STATEMENT:   Pt reports that he is doing well this morning, but is noted to be sweating and reports dry mouth. States that it is extremely hot a humid this morning. And the sweat is just from getting into the Hospital from the parking lot.     From eval: Pt is a pleasant 83 y/o male referred to PT s/p L MCA stroke. Pt also seeing speech as well. Pt  with very weak voice, at times hx difficult to take as a result. He attempted to ambulate to clinic with 4WW but became too fatigued and required assistance into WC. Pt now presents to PT eval in WC. He reports he primarily uses a 4WW at home, but does not ambulate much during the day (maybe ten minutes total). He reports difficulty with standing up from chairs. He must use grab bars in his bathroom due to difficulty standing up from toilet. He has difficulty with stairs, requires help getting up step to enter his home. He reports no recent falls, but that he does stumble with his 4WW when fatigued.  Pt accompanied by: self  PERTINENT HISTORY:  Via ED d/c note: Presented 11/16/2023 to [facility] with right facial droop left gaze preference and dysarthria. CT/MRI showed small acute nonhemorrhagic left MCA distribution infarction involving the left frontal lobe. No associated mass effect. Patient did not receive TNK. CTA showed no large vessel occlusion or evidence of finding amenable to neurovascular intervention...   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 No none currently but does report arthritis pain in L shoulder limiting ability to lift/raise arm  PRECAUTIONS: Fall  RED FLAGS: None   WEIGHT BEARING RESTRICTIONS: No  FALLS: Has patient fallen in last 6 months? No but does report stumbling with fatigue  LIVING ENVIRONMENT: Lives with: lives with  their family son and DIL Lives in: House/apartment, two level home but not using upstairs, stays on first floor Stairs: 1 step to enter home, says no handrails but he has help getting up step Has following equipment at home: Walker - 4 wheeled, shower chair, and Grab bars  PLOF: Independent  PATIENT GOALS: everything: walking, balance, strength   OBJECTIVE:  Note: Objective measures were completed at Evaluation unless otherwise noted.  DIAGNOSTIC FINDINGS:  imaging via chart  MR brain 11/16/23:  IMPRESSION: 1. Small acute nonhemorrhagic left MCA distribution infarct involving the left frontal lobe. No associated mass effect. 2. Additional punctate subcentimeter acute ischemic nonhemorrhagic infarct within the contralateral right basal ganglia. 3. Underlying moderately advanced chronic microvascular ischemic disease with a few scattered remote lacunar infarcts as above. 4. Chronic occlusion of the left vertebral artery.     Electronically Signed   By: Morene Hoard M.D.   On: 11/16/2023 23:48  CT ANGIO HEAD NECK 11/16/23  IMPRESSION: No large vessel occlusion or evidence of findings amenable to neurovascular intervention.   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   Multiple intracranial vascular stenoses as above. Focal short segment occlusion of an M2 inferior division branch of the right MCA with reconstitution noted.   Mild-to-moderate stenosis of the V4 segment right vertebral artery.   Emphysema (ICD10-J43.9).     Electronically Signed   By: Donnice Mania M.D.   On: 11/16/2023 15:50  CT HEAD 11/16/23: IMPRESSION: 1. 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 2. Subtle hypoattenuation at the anterior limb of the right internal capsule. 3. Subcortical white matter infarct in the anterior  right frontal lobe superior to the frontal horn of the right lateral ventricle. 4. Age indeterminate infarct in the left lentiform nucleus. 5. Moderate atrophy and white matter disease likely reflects the sequela of chronic  microvascular ischemia. 6. No acute hemorrhage or mass lesion.   These results were called by telephone at the time of interpretation on 11/16/2023 at 3:13 pm to provider Dr. Matthews, who verbally acknowledged these results.     Electronically Signed   By: Lonni Necessary M.D.   On: 11/16/2023 15:14  COGNITION: Overall cognitive status: Within functional limits for tasks assessed but at times difficult to fully determine due to weakness of voice and some difficulty responding to PT questions as a result   SENSATION: WFL to light touch bilat UE and bilat LE  COORDINATION: WFL rapid alt movement UE and finger chin<>target bilat  WFL rapid alt movement LE and heel>shin bilat  EDEMA:  Pt reports no swelling     POSTURE: slight rounded shoulders, forward head, and increased thoracic kyphosis   LOWER EXTREMITY MMT:    Gross bilat LE strength is 4/5, more weakness found proximal than distal   BED MOBILITY:  Not assessed  TRANSFERS: Initially min a, then pt able to complete CGA   STAIRS: Not assessed but impaired per pt report GAIT: Findings: decreased gait speed (See ), FWD flexed posture with gait, decreased bilat heel strike, more pronounced on R than L side  FUNCTIONAL TESTS:  5 times sit to stand: 44.2 sec with use UE  Timed up and go (TUG): 33 second with 4WW  10 meter walk test: 0.53 m/s with 4WW  Berg Balance Scale: deferred : deferred  PATIENT SURVEYS:  SIS-16: 54                                                                                                                              TREATMENT DATE:   Unless otherwise stated, CGA was provided and gait belt donned in order to ensure pt safety   PT instructed pt in  LTG assessment for progress note.   Pt performed 5 time sit<>stand (5xSTS): 22.08 sec with UE pushing from arm rest. 39.30 sec with no UE support and min assist to prevent posterior LOB.(>15 sec indicates increased fall risk)   PT instructed pt in TUG: 20.72 sec avg with 4WW  (average of 3 trials; >13.5 sec indicates increased fall risk)  Patient demonstrates increased fall risk as noted by score of  38 /56 on Berg Balance Scale.  (<36= high risk for falls, close to 100%; 37-45 significant >80%; 46-51 moderate >50%; 52-55 lower >25%)  6 Min Walk Test:  Instructed patient to ambulate as quickly and as safely as possible for 6 minutes using LRAD. Patient was allowed to take standing rest breaks without stopping the test, but if the patient required a sitting rest break the clock would be stopped and the test would be over.  Results: 665 feet using a 4ww with supervision-CGA . Results indicate that the patient has reduced endurance with ambulation compared to age matched norms.  Age Matched Norms: 59-69 yo M: 80 F: 41, 75-79 yo M: 4  F: 471, 5-89 yo M: 417 F: 392 MDC: 58.21 meters (190.98 feet) or 50 meters (ANPTA Core Set of Outcome Measures for Adults with Neurologic Conditions, 2018)  10 Meter Walk Test: Patient instructed to walk 10 meters (32.8 ft) as quickly and as safely as possible at their normal speed x2 and at a fast speed x2. Time measured from 2 meter mark to 8 meter mark to accommodate ramp-up and ramp-down.   Average Normal speed: 0.65 m/s  Cut off scores: <0.4 m/s = household Ambulator, 0.4-0.8 m/s = limited community Ambulator, >0.8 m/s = community Ambulator, >1.2 m/s = crossing a street, <1.0 = increased fall risk MCID 0.05 m/s (small), 0.13 m/s (moderate), 0.06 m/s (significant)  (ANPTA Core Set of Outcome Measures for Adults with Neurologic Conditions, 2018)   [facility] PT Assessment - 02/19/24 0001       Berg Balance Test   Sit to Stand Able to stand  independently using  hands    Standing Unsupported Able to stand 2 minutes with supervision    Sitting with Back Unsupported but Feet Supported on Floor or Stool Able to sit safely and securely 2 minutes    Stand to Sit Controls descent by using hands    Transfers Able to transfer safely, definite need of hands    Standing Unsupported with Eyes Closed Able to stand 10 seconds with supervision    Standing Unsupported with Feet Together Able to place feet together independently and stand for 1 minute with supervision    From Standing, Reach Forward with Outstretched Arm Can reach confidently >25 cm (10)    From Standing Position, Pick up Object from Floor Able to pick up shoe, needs supervision    From Standing Position, Turn to Look Behind Over each Shoulder Turn sideways only but maintains balance    Turn 360 Degrees Able to turn 360 degrees safely but slowly    Standing Unsupported, Alternately Place Feet on Step/Stool Able to complete 4 steps without aid or supervision    Standing Unsupported, One Foot in Front Able to take small step independently and hold 30 seconds    Standing on One Leg Tries to lift leg/unable to hold 3 seconds but remains standing independently    Total Score 38           PATIENT EDUCATION: Education details: Pt educated throughout session about proper posture and technique with exercises. Improved exercise technique, movement at target joints, use of target muscles after min to mod verbal, visual, tactile cues.  Person educated: Patient Education method: Explanation, Demonstration, and Verbal cues Education comprehension: verbalized understanding and returned demonstration  HOME EXERCISE PROGRAM: Access Code: [email or web address] URL: [email or web address] Date: 01/30/2024 Prepared by: Massie Dollar  Exercises - Seated March  - 1 x daily - 7 x weekly - 2 sets - 10 reps - Seated Long Arc Quad  - 1 x daily - 7 x weekly - 2 sets - 10 reps - 3 sec hold - Standing Knee Flexion  AROM with Chair Support  - 1 x daily - 7 x weekly - 2 sets - 10 reps - Side Stepping with Counter Support  - 1 x daily - 7 x weekly - 3 sets - 10 reps - Sit to Stand with Armchair  - 1 x daily - 7 x weekly - 3 sets - 10 reps - Walking with Counter Support  - 1 x daily - 7 x weekly - 3 sets - 10 reps - Semi-Tandem Balance at [employer]  Top Eyes Open  - 1 x daily - 7 x weekly - 2 sets - 4 reps - 15 seconds hold - Standing March with Counter Support  - 1 x daily - 7 x weekly - 3 sets - 10 reps  GOALS: Goals reviewed with patient? Yes  SHORT TERM GOALS: Target date: 01/18/2024  Patient will be independent in home exercise program to improve strength/mobility for better functional independence with ADLs. Baseline: 01/15/2024= Patient able to verbalize and demonstrate his seated HEP without prompting- states compliance.  Goal status: MET   LONG TERM GOALS: Target date:02/29/2024  Patient will increase SIS-16 score by at least 10 points  to demonstrate increased ease with ADLs and quality of life.  Baseline: 54  Goal status: INITIAL  2.  Patient (> 80 years old) will complete five times sit to stand test in < 15 seconds indicating an increased LE strength and improved balance. Baseline: 44.2 sec with use of BUE; 01/15/2024= 36.46 sec with BUE Support (Still unable to rise without UE Support and some posterior lean- CGA)  7/14 22.08 sec with UE pushing from arm rest. 39.30 sec with no UE support and min assist to prevent posterior LOB. Goal status: PROGRESSING   3.  Patient will increase Berg Balance score by > 6 points to demonstrate decreased fall risk during functional activities Baseline: 34 7/14: 38 Goal status: INITIAL  4.  Patient will increase 10 meter walk test to >1.92m/s as to improve gait speed for better community ambulation and to reduce fall risk. Baseline: 0.53 m/s with 4WW; 01/15/2024= 0.58 m/s with 4WW 7/14: 0.57m/s  Goal status: PROGRESSING  5.  Patient will reduce timed up and  go to <11 seconds to reduce fall risk and demonstrate improved transfer/gait ability. Baseline: 33 sec with 4WW; 01/15/2024= 25.42 sec avg with 4WW 7/14: 20.72 sec avg with 4WW   Goal status: PROGRESSING  6. Patient will increase six minute walk test distance to >1000 for progression to community ambulator and improve gait ability Baseline: 310 feet using a 4WW with CGA and only completes 3:17 sec prior to requesting to sit secondary to fatigue; 01/15/2024= 610 feet in with 4WW 7/14: 637ft with 4WW   Goal status: PROGRESSING   ASSESSMENT:  CLINICAL IMPRESSION: Treatmentfocused on goal assessment for progress note. Pt continues to demonstrate improvement in functional mobility and decreased fall risk relative to baseline due to improved 5xSTS, but still requiring BUE support, increased Berg by 4 points, increased 6 min walk test, and reduced time on TUG with 4WW. Patient's condition has the potential to improve in response to therapy. Maximum improvement is yet to be obtained. The anticipated improvement is attainable and reasonable in a generally predictable time.  Pt will continue to benefit from skilled physical therapy intervention to address impairments, improve QOL, and attain therapy goals.     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   REHAB POTENTIAL: Good  CLINICAL DECISION MAKING: Evolving/moderate complexity  EVALUATION COMPLEXITY: Moderate  PLAN:  PT FREQUENCY: 1-2x/week  PT DURATION: 12 weeks  PLANNED INTERVENTIONS: [id number]-  PT Re-evaluation, 97750- Physical Performance Testing, 97110-Therapeutic exercises, 97530- Therapeutic activity, [id number]- Neuromuscular re-education, 97535-  Self Care, [id number]- Manual therapy, [phone]- Gait training, [phone]- Orthotic Initial, [phone]- Orthotic/Prosthetic subsequent, [phone]- Canalith repositioning, Patient/Family education, Balance training, Stair training, Taping, Joint mobilization, Spinal mobilization, Vestibular training, DME instructions, Wheelchair mobility training, Cryotherapy, and Moist heat  PLAN FOR NEXT SESSION:    Progress gait with least restrict assistive device  Improve dynamic balance including narrowed BOS Progress overall LE strength especially R side glutes. Progress HEP as indicated.    Massie Dollar PT, DPT  Physical Therapist - [facility]  11:49 AM 02/19/24

## 2024-02-21 ENCOUNTER — Encounter: Admitting: Speech Pathology

## 2024-02-21 ENCOUNTER — Ambulatory Visit

## 2024-02-21 DIAGNOSIS — R262 Difficulty in walking, not elsewhere classified: Secondary | ICD-10-CM

## 2024-02-21 DIAGNOSIS — M6281 Muscle weakness (generalized): Secondary | ICD-10-CM

## 2024-02-21 DIAGNOSIS — R2681 Unsteadiness on feet: Secondary | ICD-10-CM

## 2024-02-21 DIAGNOSIS — R278 Other lack of coordination: Secondary | ICD-10-CM

## 2024-02-21 NOTE — Therapy (Signed)
 OUTPATIENT PHYSICAL THERAPY NEURO TREATMENT    Patient Name: [name] [name] MRN: [id number] DOB:11-01-1940, 83 y.o., male Today's Date: 02/21/2024   PCP: Rudy Alyce RAMAN, MD REFERRING PROVIDER:   Pegge Toribio PARAS, PA-C    END OF SESSION:  PT End of Session - 02/21/24 1102     Visit Number 21    Number of Visits 25    Date for PT Re-Evaluation 02/29/24    Progress Note Due on Visit 20    PT Start Time 1102    PT Stop Time 1145    PT Time Calculation (min) 43 min    Equipment Utilized During Treatment Gait belt    Activity Tolerance Patient tolerated treatment well    Behavior During Therapy Flat affect                Past Medical History:  Diagnosis Date   Arthritis    Depression    Diabetes mellitus without complication (HCC)    Gout    Hyperlipidemia    Hypertension    Ischemic cardiomyopathy    History reviewed. No pertinent surgical history. 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   Ischemic cardiomyopathy 06/12/2018   Thrombocytosis 04/16/2016   Adjustment reaction with prolonged depressive reaction 01/22/2014   Visual loss, right eye 07/30/2012   Type 2 diabetes mellitus (HCC) 06/16/2011   Coronary artery disease 06/16/2011   Hypertension associated with diabetes (HCC) 06/16/2011    ONSET DATE: 11/16/2023  REFERRING DIAG: P36.487 (ICD-10-CM) - Acute ischemic left MCA stroke (HCC)   THERAPY DIAG:  Muscle weakness (generalized)  Difficulty in walking, not elsewhere classified  Unsteadiness on feet  Other lack of coordination  Rationale for Evaluation and Treatment: Rehabilitation  SUBJECTIVE:                                                                                                                                                                                              SUBJECTIVE STATEMENT:   Pt reports doing okay. States tired when he leaves PT.     From eval: Pt is a pleasant 83 y/o male referred to PT s/p L MCA stroke. Pt also seeing speech as well. Pt with very weak voice, at times hx difficult to take as a result. He attempted to ambulate to clinic with 4WW but became too fatigued and required assistance into WC. Pt now presents to PT eval in WC. He reports he primarily uses a 4WW at  home, but does not ambulate much during the day (maybe ten minutes total). He reports difficulty with standing up from chairs. He must use grab bars in his bathroom due to difficulty standing up from toilet. He has difficulty with stairs, requires help getting up step to enter his home. He reports no recent falls, but that he does stumble with his 4WW when fatigued.  Pt accompanied by: self  PERTINENT HISTORY:  Via ED d/c note: Presented 11/16/2023 to [facility] with right facial droop left gaze preference and dysarthria. CT/MRI showed small acute nonhemorrhagic left MCA distribution infarction involving the left frontal lobe. No associated mass effect. Patient did not receive TNK. CTA showed no large vessel occlusion or evidence of finding amenable to neurovascular intervention...   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 No none currently but does report arthritis pain in L shoulder limiting ability to lift/raise arm  PRECAUTIONS: Fall  RED FLAGS: None   WEIGHT BEARING RESTRICTIONS: No  FALLS: Has patient fallen in last 6 months? No but does report stumbling with fatigue  LIVING ENVIRONMENT: Lives with: lives with their family son and DIL Lives in: House/apartment, two level home but not using upstairs, stays on first floor Stairs: 1 step to enter home, says no handrails but he has help getting up step Has  following equipment at home: Walker - 4 wheeled, shower chair, and Grab bars  PLOF: Independent  PATIENT GOALS: everything: walking, balance, strength   OBJECTIVE:  Note: Objective measures were completed at Evaluation unless otherwise noted.  DIAGNOSTIC FINDINGS:  imaging via chart  MR brain 11/16/23:  IMPRESSION: 1. Small acute nonhemorrhagic left MCA distribution infarct involving the left frontal lobe. No associated mass effect. 2. Additional punctate subcentimeter acute ischemic nonhemorrhagic infarct within the contralateral right basal ganglia. 3. Underlying moderately advanced chronic microvascular ischemic disease with a few scattered remote lacunar infarcts as above. 4. Chronic occlusion of the left vertebral artery.     Electronically Signed   By: Morene Hoard M.D.   On: 11/16/2023 23:48  CT ANGIO HEAD NECK 11/16/23  IMPRESSION: No large vessel occlusion or evidence of findings amenable to neurovascular intervention.   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   Multiple intracranial vascular stenoses as above. Focal short segment occlusion of an M2 inferior division branch of the right MCA with reconstitution noted.   Mild-to-moderate stenosis of the V4 segment right vertebral artery.   Emphysema (ICD10-J43.9).     Electronically Signed   By: Donnice Mania M.D.   On: 11/16/2023 15:50  CT HEAD 11/16/23: IMPRESSION: 1. 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 2. Subtle hypoattenuation at the anterior limb of the right internal capsule. 3. 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 4. Age indeterminate infarct in the left lentiform nucleus. 5. Moderate atrophy and white matter disease likely  reflects the sequela of chronic microvascular ischemia. 6. No acute hemorrhage or mass lesion.   These results were called by telephone at the time of interpretation on 11/16/2023 at 3:13 pm to provider Dr. Matthews, who verbally acknowledged these results.     Electronically Signed   By: Lonni  Mattern M.D.   On: 11/16/2023 15:14  COGNITION: Overall cognitive status: Within functional limits for tasks assessed but at times difficult to fully determine due to weakness of voice and some difficulty responding to PT questions as a result   SENSATION: WFL to light touch bilat UE and bilat LE  COORDINATION: WFL rapid alt movement UE and finger chin<>target bilat  WFL rapid alt movement LE and heel>shin bilat  EDEMA:  Pt reports no swelling     POSTURE: slight rounded shoulders, forward head, and increased thoracic kyphosis   LOWER EXTREMITY MMT:    Gross bilat LE strength is 4/5, more weakness found proximal than distal   BED MOBILITY:  Not assessed  TRANSFERS: Initially min a, then pt able to complete CGA   STAIRS: Not assessed but impaired per pt report GAIT: Findings: decreased gait speed (See ), FWD flexed posture with gait, decreased bilat heel strike, more pronounced on R than L side  FUNCTIONAL TESTS:  5 times sit to stand: 44.2 sec with use UE  Timed up and go (TUG): 33 second with 4WW  10 meter walk test: 0.53 m/s with 4WW  Berg Balance Scale: deferred : deferred  PATIENT SURVEYS:  SIS-16: 54                                                                                                                              TREATMENT DATE:   Unless otherwise stated, CGA was provided and gait belt donned in order to ensure pt safety    NMR:  Dynamic high knee march walk in // bars 3# AW down and back x 5  Fwd/Bwd step over 1/2 foam roll with 3# AW x 15 reps (Patient reports as hard)   FWD/retro gait (10 feet distance) 3 #AW - down and back x  6  Side stepping in // bars 3# AW down and back in // bars x 4  TA:  STS- No UE support x 10 reps from reg height chair STS - Holding onto 5kg ball at chest from 25 height EOM x 10  Fwd step up x 15 alt LE with min UE support Side step up x 15 alt LE with min UE support         PATIENT EDUCATION: Education details: Pt educated throughout session about proper posture and technique with exercises. Improved exercise technique, movement at target joints, use of target muscles after min to mod verbal, visual, tactile cues.  Person educated: Patient Education method: Explanation, Demonstration, and Verbal cues Education comprehension: verbalized understanding and returned demonstration  HOME EXERCISE PROGRAM: Access Code: [email or web address] URL: [email or web address] Date: 01/30/2024 Prepared by: Massie Dollar  Exercises - Seated March  - 1 x daily - 7 x weekly - 2 sets - 10 reps - Seated Long Arc Quad  - 1 x daily - 7 x weekly - 2 sets - 10 reps - 3 sec hold - Standing Knee Flexion AROM with  Chair Support  - 1 x daily - 7 x weekly - 2 sets - 10 reps - Side Stepping with Counter Support  - 1 x daily - 7 x weekly - 3 sets - 10 reps - Sit to Stand with Armchair  - 1 x daily - 7 x weekly - 3 sets - 10 reps - Walking with Counter Support  - 1 x daily - 7 x weekly - 3 sets - 10 reps - Semi-Tandem Balance at [employer] Eyes Open  - 1 x daily - 7 x weekly - 2 sets - 4 reps - 15 seconds hold - Standing March with Counter Support  - 1 x daily - 7 x weekly - 3 sets - 10 reps  GOALS: Goals reviewed with patient? Yes  SHORT TERM GOALS: Target date: 01/18/2024  Patient will be independent in home exercise program to improve strength/mobility for better functional independence with ADLs. Baseline: 01/15/2024= Patient able to verbalize and demonstrate his seated HEP without prompting- states compliance.  Goal status: MET   LONG TERM GOALS: Target date:02/29/2024  Patient will  increase SIS-16 score by at least 10 points  to demonstrate increased ease with ADLs and quality of life.  Baseline: 54  Goal status: INITIAL  2.  Patient (> 62 years old) will complete five times sit to stand test in < 15 seconds indicating an increased LE strength and improved balance. Baseline: 44.2 sec with use of BUE; 01/15/2024= 36.46 sec with BUE Support (Still unable to rise without UE Support and some posterior lean- CGA)  7/14 22.08 sec with UE pushing from arm rest. 39.30 sec with no UE support and min assist to prevent posterior LOB. Goal status: PROGRESSING   3.  Patient will increase Berg Balance score by > 6 points to demonstrate decreased fall risk during functional activities Baseline: 34 7/14: 38 Goal status: INITIAL  4.  Patient will increase 10 meter walk test to >1.62m/s as to improve gait speed for better community ambulation and to reduce fall risk. Baseline: 0.53 m/s with 4WW; 01/15/2024= 0.58 m/s with 4WW 7/14: 0.69m/s  Goal status: PROGRESSING  5.  Patient will reduce timed up and go to <11 seconds to reduce fall risk and demonstrate improved transfer/gait ability. Baseline: 33 sec with 4WW; 01/15/2024= 25.42 sec avg with 4WW 7/14: 20.72 sec avg with 4WW   Goal status: PROGRESSING  6. Patient will increase six minute walk test distance to >1000 for progression to community ambulator and improve gait ability Baseline: 310 feet using a 4WW with CGA and only completes 3:17 sec prior to requesting to sit secondary to fatigue; 01/15/2024= 610 feet in with 4WW 7/14: 664ft with 4WW   Goal status: PROGRESSING   ASSESSMENT:  CLINICAL IMPRESSION: Patient presents with good motivation today and performed well overall- able to increase standing time and exhibiting some R knee buckling and hip dip with walking with decreased confidence in weight bearing through RLE. HE did improve overall during visit and mostly just complained of all over fatigue. Pt will continue to  benefit from skilled physical therapy intervention to address impairments, improve QOL, and attain therapy goals.     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   REHAB POTENTIAL: Good  CLINICAL DECISION MAKING: Evolving/moderate complexity  EVALUATION COMPLEXITY: Moderate  PLAN:  PT FREQUENCY: 1-2x/week  PT DURATION: 12 weeks  PLANNED INTERVENTIONS: [id number]- PT Re-evaluation, 97750- Physical Performance Testing, 97110-Therapeutic exercises, 97530- Therapeutic activity, [id number]- Neuromuscular re-education, 97535- Self Care, [id number]- Manual therapy, [id number]- Gait training, [phone]- Orthotic Initial, [phone]- Orthotic/Prosthetic subsequent, [phone]- Canalith repositioning, Patient/Family education, Balance training, Stair training, Taping, Joint mobilization, Spinal mobilization, Vestibular training, DME instructions, Wheelchair mobility training, Cryotherapy, and Moist heat  PLAN FOR NEXT SESSION:    Progress gait with least restrict assistive device  Improve dynamic balance including narrowed BOS Progress overall LE strength especially R side glutes. Progress HEP as indicated.    Chyrl London, PT Physical Therapist - [facility]  12:25 PM 02/21/24

## 2024-02-26 ENCOUNTER — Ambulatory Visit: Admitting: Physical Therapy

## 2024-02-26 ENCOUNTER — Encounter

## 2024-02-26 DIAGNOSIS — M6281 Muscle weakness (generalized): Secondary | ICD-10-CM

## 2024-02-26 DIAGNOSIS — R278 Other lack of coordination: Secondary | ICD-10-CM

## 2024-02-26 DIAGNOSIS — R2681 Unsteadiness on feet: Secondary | ICD-10-CM

## 2024-02-26 DIAGNOSIS — R262 Difficulty in walking, not elsewhere classified: Secondary | ICD-10-CM

## 2024-02-26 NOTE — Therapy (Signed)
 OUTPATIENT PHYSICAL THERAPY NEURO TREATMENT/ Patient Name: [name] [name] MRN: [id number] DOB:10/05/1940, 83 y.o., male Today's Date: 02/26/2024   PCP: Rudy Alyce RAMAN, MD REFERRING PROVIDER:   Pegge Toribio PARAS, PA-C    END OF SESSION:  PT End of Session - 02/26/24 1058     Visit Number 22    Number of Visits 25    Date for PT Re-Evaluation 02/29/24    Progress Note Due on Visit 20    PT Start Time 1100    PT Stop Time 1140    PT Time Calculation (min) 40 min    Equipment Utilized During Treatment Gait belt    Activity Tolerance Patient tolerated treatment well    Behavior During Therapy Flat affect                Past Medical History:  Diagnosis Date   Arthritis    Depression    Diabetes mellitus without complication (HCC)    Gout    Hyperlipidemia    Hypertension    Ischemic cardiomyopathy    No past surgical history on file. 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   Ischemic cardiomyopathy 06/12/2018   Thrombocytosis 04/16/2016   Adjustment reaction with prolonged depressive reaction 01/22/2014   Visual loss, right eye 07/30/2012   Type 2 diabetes mellitus (HCC) 06/16/2011   Coronary artery disease 06/16/2011   Hypertension associated with diabetes (HCC) 06/16/2011    ONSET DATE: 11/16/2023  REFERRING DIAG: P36.487 (ICD-10-CM) - Acute ischemic left MCA stroke (HCC)   THERAPY DIAG:  Muscle weakness (generalized)  Difficulty in walking, not elsewhere classified  Unsteadiness on feet  Other lack of coordination  Rationale for Evaluation and Treatment: Rehabilitation  SUBJECTIVE:                                                                                                                                                                                             SUBJECTIVE STATEMENT:    Pt reports that he is doing well this morning. No increased pain reported by pt throughout session.    From eval: Pt is a pleasant 83 y/o male referred to PT s/p L MCA stroke. Pt also seeing speech as well. Pt with very weak voice, at times hx difficult to take as a result. He attempted to ambulate to clinic with 4WW but became too fatigued and required assistance into WC. Pt now presents to PT eval in WC. He reports he primarily uses  a 4WW at home, but does not ambulate much during the day (maybe ten minutes total). He reports difficulty with standing up from chairs. He must use grab bars in his bathroom due to difficulty standing up from toilet. He has difficulty with stairs, requires help getting up step to enter his home. He reports no recent falls, but that he does stumble with his 4WW when fatigued.  Pt accompanied by: self  PERTINENT HISTORY:  Via ED d/c note: Presented 11/16/2023 to [facility] with right facial droop left gaze preference and dysarthria. CT/MRI showed small acute nonhemorrhagic left MCA distribution infarction involving the left frontal lobe. No associated mass effect. Patient did not receive TNK. CTA showed no large vessel occlusion or evidence of finding amenable to neurovascular intervention...   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 No none currently but does report arthritis pain in L shoulder limiting ability to lift/raise arm  PRECAUTIONS: Fall  RED FLAGS: None   WEIGHT BEARING RESTRICTIONS: No  FALLS: Has patient fallen in last 6 months? No but does report stumbling with fatigue  LIVING ENVIRONMENT: Lives with: lives with their family son and DIL Lives in: House/apartment, two level home but not using upstairs, stays on first floor Stairs: 1 step to enter home, says no handrails but he has help getting  up step Has following equipment at home: Walker - 4 wheeled, shower chair, and Grab bars  PLOF: Independent  PATIENT GOALS: everything: walking, balance, strength   OBJECTIVE:  Note: Objective measures were completed at Evaluation unless otherwise noted.  DIAGNOSTIC FINDINGS:  imaging via chart  MR brain 11/16/23:  IMPRESSION: 1. Small acute nonhemorrhagic left MCA distribution infarct involving the left frontal lobe. No associated mass effect. 2. Additional punctate subcentimeter acute ischemic nonhemorrhagic infarct within the contralateral right basal ganglia. 3. Underlying moderately advanced chronic microvascular ischemic disease with a few scattered remote lacunar infarcts as above. 4. Chronic occlusion of the left vertebral artery.     Electronically Signed   By: Morene Hoard M.D.   On: 11/16/2023 23:48  CT ANGIO HEAD NECK 11/16/23  IMPRESSION: No large vessel occlusion or evidence of findings amenable to neurovascular intervention.   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   Multiple intracranial vascular stenoses as above. Focal short segment occlusion of an M2 inferior division branch of the right MCA with reconstitution noted.   Mild-to-moderate stenosis of the V4 segment right vertebral artery.   Emphysema (ICD10-J43.9).     Electronically Signed   By: Donnice Mania M.D.   On: 11/16/2023 15:50  CT HEAD 11/16/23: IMPRESSION: 1. 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 2. Subtle hypoattenuation at the anterior limb of the right internal capsule. 3. 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 4. Age indeterminate infarct in the left lentiform nucleus. 5. Moderate atrophy and white matter disease  likely reflects the sequela of chronic microvascular ischemia. 6. No acute hemorrhage or mass lesion.   These results were called by telephone at the time of interpretation on 11/16/2023 at 3:13 pm to provider Dr. Matthews, who verbally acknowledged these results.     Electronically Signed  By: Lonni Necessary M.D.   On: 11/16/2023 15:14  COGNITION: Overall cognitive status: Within functional limits for tasks assessed but at times difficult to fully determine due to weakness of voice and some difficulty responding to PT questions as a result   SENSATION: WFL to light touch bilat UE and bilat LE  COORDINATION: WFL rapid alt movement UE and finger chin<>target bilat  WFL rapid alt movement LE and heel>shin bilat  EDEMA:  Pt reports no swelling     POSTURE: slight rounded shoulders, forward head, and increased thoracic kyphosis   LOWER EXTREMITY MMT:    Gross bilat LE strength is 4/5, more weakness found proximal than distal   BED MOBILITY:  Not assessed  TRANSFERS: Initially min a, then pt able to complete CGA   STAIRS: Not assessed but impaired per pt report GAIT: Findings: decreased gait speed (See ), FWD flexed posture with gait, decreased bilat heel strike, more pronounced on R than L side  FUNCTIONAL TESTS:  5 times sit to stand: 44.2 sec with use UE  Timed up and go (TUG): 33 second with 4WW  10 meter walk test: 0.53 m/s with 4WW  Berg Balance Scale: deferred : deferred  PATIENT SURVEYS:  SIS-16: 54                                                                                                                              TREATMENT DATE:   Unless otherwise stated, CGA was provided and gait belt donned in order to ensure pt safety   NMR for improved neuromotor activation on affected LE as well as improved reciprocal movement patterns with functional movements.   Nustep reciprocal movement training x 5 min level 1-4 with ramp up for  2.52min and ramp down   Gait with Rollator x 138ft with cues for improved heel contact on the RLE. Gait without  AD; min assist for posture and reduced trendelenburg on the RLE. X 120ft as well as cues for hip flexion to reduce foot drag on the R.   Seated Hip abduction x 12 RTB Sit<>stand with RTB at knees and gluteal activation x 8  Reciprocal march RTB x 12  HS curl x 12 bil with RTB.   Weighted gait with 4WW and 2.5# AW on the RLE; 2 x122ft cues for posture and knee/hip flexion with limb advancement to reduce foot drag as listed above.   Additional gait without UE support x 14ft and min assist for improved pelvic rotation and alignment to.     PATIENT EDUCATION: Education details: Pt educated throughout session about proper posture and technique with exercises. Improved exercise technique, movement at target joints, use of target muscles after min to mod verbal, visual, tactile cues.  Person educated: Patient Education method: Explanation, Demonstration, and Verbal cues Education comprehension: verbalized understanding and returned demonstration  HOME EXERCISE PROGRAM: Access Code: [email or web address] URL: [email or web address] Date: 01/30/2024 Prepared by: Massie Dollar  Exercises - Seated  March  - 1 x daily - 7 x weekly - 2 sets - 10 reps - Seated Long Arc Quad  - 1 x daily - 7 x weekly - 2 sets - 10 reps - 3 sec hold - Standing Knee Flexion AROM with Chair Support  - 1 x daily - 7 x weekly - 2 sets - 10 reps - Side Stepping with Counter Support  - 1 x daily - 7 x weekly - 3 sets - 10 reps - Sit to Stand with Armchair  - 1 x daily - 7 x weekly - 3 sets - 10 reps - Walking with Counter Support  - 1 x daily - 7 x weekly - 3 sets - 10 reps - Semi-Tandem Balance at [employer] Open  - 1 x daily - 7 x weekly - 2 sets - 4 reps - 15 seconds hold - Standing March with Counter Support  - 1 x daily - 7 x weekly - 3 sets - 10 reps  GOALS: Goals reviewed with patient?  Yes  SHORT TERM GOALS: Target date: 01/18/2024  Patient will be independent in home exercise program to improve strength/mobility for better functional independence with ADLs. Baseline: 01/15/2024= Patient able to verbalize and demonstrate his seated HEP without prompting- states compliance.  Goal status: MET   LONG TERM GOALS: Target date:02/29/2024  Patient will increase SIS-16 score by at least 10 points  to demonstrate increased ease with ADLs and quality of life.  Baseline: 54  Goal status: INITIAL  2.  Patient (> 81 years old) will complete five times sit to stand test in < 15 seconds indicating an increased LE strength and improved balance. Baseline: 44.2 sec with use of BUE; 01/15/2024= 36.46 sec with BUE Support (Still unable to rise without UE Support and some posterior lean- CGA)  7/14 22.08 sec with UE pushing from arm rest. 39.30 sec with no UE support and min assist to prevent posterior LOB. Goal status: PROGRESSING   3.  Patient will increase Berg Balance score by > 6 points to demonstrate decreased fall risk during functional activities Baseline: 34 7/14: 38 Goal status: INITIAL  4.  Patient will increase 10 meter walk test to >1.78m/s as to improve gait speed for better community ambulation and to reduce fall risk. Baseline: 0.53 m/s with 4WW; 01/15/2024= 0.58 m/s with 4WW 7/14: 0.4m/s  Goal status: PROGRESSING  5.  Patient will reduce timed up and go to <11 seconds to reduce fall risk and demonstrate improved transfer/gait ability. Baseline: 33 sec with 4WW; 01/15/2024= 25.42 sec avg with 4WW 7/14: 20.72 sec avg with 4WW   Goal status: PROGRESSING  6. Patient will increase six minute walk test distance to >1000 for progression to community ambulator and improve gait ability Baseline: 310 feet using a 4WW with CGA and only completes 3:17 sec prior to requesting to sit secondary to fatigue; 01/15/2024= 610 feet in with 4WW 7/14: 675ft with 4WW   Goal status:  PROGRESSING   ASSESSMENT:  CLINICAL IMPRESSION: Treatment focused on Functional mobility and hip strengthening to improve neuromotor activation and normalized movement patterns with and without UE support. Pt tolerated Unassisted gait well with tactile instruction for pelvic activation to allow improved step clearance and heel contact. Also continued to address hip weakness with increased resistance and repetitions. Pt will continue to benefit from skilled physical therapy intervention to address impairments, improve QOL, and attain therapy goals.     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   REHAB POTENTIAL: Good  CLINICAL DECISION MAKING: Evolving/moderate complexity  EVALUATION COMPLEXITY: Moderate  PLAN:  PT FREQUENCY: 1-2x/week  PT DURATION: 12 weeks  PLANNED INTERVENTIONS: [id number]- PT Re-evaluation, 97750- Physical Performance Testing, 97110-Therapeutic exercises, 97530- Therapeutic activity, [id number]- Neuromuscular re-education, 97535- Self Care, [id number]- Manual therapy, [id number]- Gait training, [phone]- Orthotic Initial, [phone]- Orthotic/Prosthetic subsequent, [phone]- Canalith repositioning, Patient/Family education, Balance training, Stair training, Taping, Joint mobilization, Spinal mobilization, Vestibular training, DME instructions, Wheelchair mobility training, Cryotherapy, and Moist heat  PLAN FOR NEXT SESSION:    Progress gait with least restrict assistive device  Improve dynamic balance including narrowed BOS Progress overall LE strength  especially R side glutes. Progress HEP as indicated.    Massie Dollar PT, DPT  Physical Therapist - [employer]  10:59 AM 02/26/24

## 2024-02-27 ENCOUNTER — Other Ambulatory Visit: Payer: Self-pay | Admitting: Physical Medicine and Rehabilitation

## 2024-02-28 ENCOUNTER — Other Ambulatory Visit: Payer: Self-pay

## 2024-02-28 ENCOUNTER — Ambulatory Visit: Admitting: Physical Therapy

## 2024-02-28 ENCOUNTER — Encounter: Payer: Self-pay | Admitting: Physical Therapy

## 2024-02-28 ENCOUNTER — Institutional Professional Consult (permissible substitution): Admitting: Cardiology

## 2024-02-28 ENCOUNTER — Encounter: Admitting: Speech Pathology

## 2024-02-28 DIAGNOSIS — R262 Difficulty in walking, not elsewhere classified: Secondary | ICD-10-CM

## 2024-02-28 DIAGNOSIS — R2681 Unsteadiness on feet: Secondary | ICD-10-CM

## 2024-02-28 DIAGNOSIS — M6281 Muscle weakness (generalized): Secondary | ICD-10-CM

## 2024-02-28 MED ORDER — TRAZODONE HCL 50 MG PO TABS
50.0000 mg | ORAL_TABLET | Freq: Every day | ORAL | 0 refills | Status: DC
  Filled 2024-02-28: qty 30, 30d supply, fill #0

## 2024-02-28 MED ORDER — ROSUVASTATIN CALCIUM 20 MG PO TABS
20.0000 mg | ORAL_TABLET | Freq: Every day | ORAL | 0 refills | Status: DC
  Filled 2024-02-28: qty 30, 30d supply, fill #0

## 2024-02-28 MED ORDER — CLOPIDOGREL BISULFATE 75 MG PO TABS
75.0000 mg | ORAL_TABLET | Freq: Every day | ORAL | 0 refills | Status: DC
  Filled 2024-02-28: qty 30, 30d supply, fill #0

## 2024-02-28 MED ORDER — AMANTADINE HCL 100 MG PO CAPS
100.0000 mg | ORAL_CAPSULE | Freq: Every day | ORAL | 0 refills | Status: DC
  Filled 2024-02-28: qty 30, 30d supply, fill #0

## 2024-02-28 MED ORDER — METOPROLOL TARTRATE 25 MG PO TABS
25.0000 mg | ORAL_TABLET | Freq: Two times a day (BID) | ORAL | 0 refills | Status: DC
  Filled 2024-02-28: qty 60, 30d supply, fill #0

## 2024-02-28 MED ORDER — LOSARTAN POTASSIUM 50 MG PO TABS
50.0000 mg | ORAL_TABLET | Freq: Every day | ORAL | 0 refills | Status: DC
Start: 2024-02-28 — End: 2024-03-21
  Filled 2024-02-28: qty 30, 30d supply, fill #0

## 2024-02-28 NOTE — Therapy (Signed)
 OUTPATIENT PHYSICAL THERAPY NEURO TREATMENT/RE-CERTIFICATION Patient Name: [name] [name] MRN: [id number] DOB:1941/03/13, 83 y.o., male Today's Date: 02/28/2024   PCP: Rudy Alyce RAMAN, MD REFERRING PROVIDER:   Pegge Toribio PARAS, PA-C    END OF SESSION:  PT End of Session - 02/28/24 1045     Visit Number 23    Number of Visits 39    Date for PT Re-Evaluation 04/24/24    Progress Note Due on Visit 30    PT Start Time 1100    PT Stop Time 1145    PT Time Calculation (min) 45 min    Equipment Utilized During Treatment Gait belt    Activity Tolerance Patient tolerated treatment well    Behavior During Therapy Flat affect                Past Medical History:  Diagnosis Date   Arthritis    Depression    Diabetes mellitus without complication (HCC)    Gout    Hyperlipidemia    Hypertension    Ischemic cardiomyopathy    History reviewed. No pertinent surgical history. 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   Ischemic cardiomyopathy 06/12/2018   Thrombocytosis 04/16/2016   Adjustment reaction with prolonged depressive reaction 01/22/2014   Visual loss, right eye 07/30/2012   Type 2 diabetes mellitus (HCC) 06/16/2011   Coronary artery disease 06/16/2011   Hypertension associated with diabetes (HCC) 06/16/2011    ONSET DATE: 11/16/2023  REFERRING DIAG: P36.487 (ICD-10-CM) - Acute ischemic left MCA stroke (HCC)   THERAPY DIAG:  Muscle weakness (generalized)  Difficulty in walking, not elsewhere classified  Unsteadiness on feet  Rationale for Evaluation and Treatment: Rehabilitation  SUBJECTIVE:                                                                                                                                                                                             SUBJECTIVE STATEMENT:    Pt presents to clinic today motivated for Pt activities. States he thinks he is getting better.    From eval: Pt is a pleasant 83 y/o male referred to PT s/p L MCA stroke. Pt also seeing speech as well. Pt with very weak voice, at times hx difficult to take as a result. He attempted to ambulate to clinic with 4WW but became too fatigued and required assistance into WC. Pt now presents to PT eval in WC. He reports he primarily uses a 4WW at home, but does  not ambulate much during the day (maybe ten minutes total). He reports difficulty with standing up from chairs. He must use grab bars in his bathroom due to difficulty standing up from toilet. He has difficulty with stairs, requires help getting up step to enter his home. He reports no recent falls, but that he does stumble with his 4WW when fatigued.  Pt accompanied by: self  PERTINENT HISTORY:  Via ED d/c note: Presented 11/16/2023 to [facility] with right facial droop left gaze preference and dysarthria. CT/MRI showed small acute nonhemorrhagic left MCA distribution infarction involving the left frontal lobe. No associated mass effect. Patient did not receive TNK. CTA showed no large vessel occlusion or evidence of finding amenable to neurovascular intervention...   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 No none currently but does report arthritis pain in L shoulder limiting ability to lift/raise arm  PRECAUTIONS: Fall  RED FLAGS: None   WEIGHT BEARING RESTRICTIONS: No  FALLS: Has patient fallen in last 6 months? No but does report stumbling with fatigue  LIVING ENVIRONMENT: Lives with: lives with their family son and DIL Lives in: House/apartment, two level home but not using upstairs, stays on first floor Stairs: 1 step to enter home, says no handrails but he has help getting up  step Has following equipment at home: Walker - 4 wheeled, shower chair, and Grab bars  PLOF: Independent  PATIENT GOALS: everything: walking, balance, strength   OBJECTIVE:  Note: Objective measures were completed at Evaluation unless otherwise noted.  DIAGNOSTIC FINDINGS:  imaging via chart  MR brain 11/16/23:  IMPRESSION: 1. Small acute nonhemorrhagic left MCA distribution infarct involving the left frontal lobe. No associated mass effect. 2. Additional punctate subcentimeter acute ischemic nonhemorrhagic infarct within the contralateral right basal ganglia. 3. Underlying moderately advanced chronic microvascular ischemic disease with a few scattered remote lacunar infarcts as above. 4. Chronic occlusion of the left vertebral artery.     Electronically Signed   By: Morene Hoard M.D.   On: 11/16/2023 23:48  CT ANGIO HEAD NECK 11/16/23  IMPRESSION: No large vessel occlusion or evidence of findings amenable to neurovascular intervention.   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   Multiple intracranial vascular stenoses as above. Focal short segment occlusion of an M2 inferior division branch of the right MCA with reconstitution noted.   Mild-to-moderate stenosis of the V4 segment right vertebral artery.   Emphysema (ICD10-J43.9).     Electronically Signed   By: Donnice Mania M.D.   On: 11/16/2023 15:50  CT HEAD 11/16/23: IMPRESSION: 1. 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 2. Subtle hypoattenuation at the anterior limb of the right internal capsule. 3. 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 4. Age indeterminate infarct in the left lentiform nucleus. 5. Moderate atrophy and white matter disease  likely reflects the sequela of chronic microvascular ischemia. 6. No acute hemorrhage or mass lesion.   These results were called by telephone at the time of interpretation on 11/16/2023 at 3:13 pm to provider Dr. Matthews, who verbally acknowledged these results.     Electronically Signed   By: Lonni Shoshana HERO.D.  On: 11/16/2023 15:14  COGNITION: Overall cognitive status: Within functional limits for tasks assessed but at times difficult to fully determine due to weakness of voice and some difficulty responding to PT questions as a result   SENSATION: WFL to light touch bilat UE and bilat LE  COORDINATION: WFL rapid alt movement UE and finger chin<>target bilat  WFL rapid alt movement LE and heel>shin bilat  EDEMA:  Pt reports no swelling     POSTURE: slight rounded shoulders, forward head, and increased thoracic kyphosis   LOWER EXTREMITY MMT:    Gross bilat LE strength is 4/5, more weakness found proximal than distal   BED MOBILITY:  Not assessed  TRANSFERS: Initially min a, then pt able to complete CGA   STAIRS: Not assessed but impaired per pt report GAIT: Findings: decreased gait speed (See ), FWD flexed posture with gait, decreased bilat heel strike, more pronounced on R than L side  FUNCTIONAL TESTS:  5 times sit to stand: 44.2 sec with use UE  Timed up and go (TUG): 33 second with 4WW  10 meter walk test: 0.53 m/s with 4WW  Berg Balance Scale: deferred : deferred  PATIENT SURVEYS:  SIS-16: 54                                                                                                                              TREATMENT DATE:   Treatment time today reflects re-cert and administration of outcome measure  SIS-16 - 51, Caregiver was not present for administration of outcome measure, pt did not have their glasses today. SPT read out OM to patient.  Unless otherwise stated, CGA was provided and gait belt donned in order to ensure  pt safety   TE- To improve strength, endurance, mobility, and function of specific targeted muscle groups or improve joint range of motion or improve muscle flexibility   Nustep reciprocal movement training x 5 min lvl 1-5 with 2 min ramp down to lvl 3  Seated Hip abduction 2 x 12 RTB Standing hip Abduction at safety bar 2x10 RTB Sit<>stand with RTB at knees and gluteal activation 2 x 8 B UE support, last 3 of second set with no UE support but minA from SPT. VC for glute activation to obtain upright posture  Standing Hip extension 2x10 at safety bar   Gait training   Gait c 4WW, 3 laps in hallway, Good step length and cadence. VC to increase step height during second lap to prevent shuffling.         PATIENT EDUCATION: Education details: Pt educated throughout session about proper posture and technique with exercises. Improved exercise technique, movement at target joints, use of target muscles after min to mod verbal, visual, tactile cues.  Person educated: Patient Education method: Explanation, Demonstration, and Verbal cues Education comprehension: verbalized understanding and returned demonstration  HOME EXERCISE PROGRAM: Access Code: [email or web address] URL: [email or web address] Date: 01/30/2024 Prepared by: Massie Dollar  Exercises - Seated  March  - 1 x daily - 7 x weekly - 2 sets - 10 reps - Seated Long Arc Quad  - 1 x daily - 7 x weekly - 2 sets - 10 reps - 3 sec hold - Standing Knee Flexion AROM with Chair Support  - 1 x daily - 7 x weekly - 2 sets - 10 reps - Side Stepping with Counter Support  - 1 x daily - 7 x weekly - 3 sets - 10 reps - Sit to Stand with Armchair  - 1 x daily - 7 x weekly - 3 sets - 10 reps - Walking with Counter Support  - 1 x daily - 7 x weekly - 3 sets - 10 reps - Semi-Tandem Balance at [employer] Open  - 1 x daily - 7 x weekly - 2 sets - 4 reps - 15 seconds hold - Standing March with Counter Support  - 1 x daily - 7 x weekly - 3  sets - 10 reps  GOALS: Goals reviewed with patient? Yes  SHORT TERM GOALS: Target date: 01/18/2024  Patient will be independent in home exercise program to improve strength/mobility for better functional independence with ADLs. Baseline: 01/15/2024= Patient able to verbalize and demonstrate his seated HEP without prompting- states compliance.  Goal status: MET   LONG TERM GOALS: Target date: 04/24/2024      Patient will increase SIS-16 score by at least 10 points  to demonstrate increased ease with ADLs and quality of life.  Baseline: 54, 7/23: 51  Goal status: ONGOING  2.  Patient (> 21 years old) will complete five times sit to stand test in < 15 seconds indicating an increased LE strength and improved balance. Baseline: 44.2 sec with use of BUE; 01/15/2024= 36.46 sec with BUE Support (Still unable to rise without UE Support and some posterior lean- CGA)  7/14 22.08 sec with UE pushing from arm rest. 39.30 sec with no UE support and min assist to prevent posterior LOB. Goal status: PROGRESSING   3.  Patient will increase Berg Balance score by > 6 points to demonstrate decreased fall risk during functional activities Baseline: 34 7/14: 38 Goal status: ONGOING  4.  Patient will increase 10 meter walk test to >1.47m/s as to improve gait speed for better community ambulation and to reduce fall risk. Baseline: 0.53 m/s with 4WW; 01/15/2024= 0.58 m/s with 4WW 7/14: 0.46m/s  Goal status: PROGRESSING  5.  Patient will reduce timed up and go to <11 seconds to reduce fall risk and demonstrate improved transfer/gait ability. Baseline: 33 sec with 4WW; 01/15/2024= 25.42 sec avg with 4WW 7/14: 20.72 sec avg with 4WW   Goal status: PROGRESSING  6. Patient will increase six minute walk test distance to >1000 for progression to community ambulator and improve gait ability Baseline: 310 feet using a 4WW with CGA and only completes 3:17 sec prior to requesting to sit secondary to fatigue; 01/15/2024=  610 feet in with 4WW 7/14: 656ft with 4WW   Goal status: PROGRESSING   ASSESSMENT:  CLINICAL IMPRESSION: Treatment today focused on general LE strengthening and gait. Pt displayed difficulty with STS initially, but progressed during the intervention with VC for glute squeeze and anterior weight shift. Pt demo's good step length and cadence c 4WW for short distance ambulation. After about 274ft, he begins to shuffler his feet and decrease his step length. VC was provided to increase step height but helpfulness of cue was limited d/t pt fatigue. Pt  will continue to benefit from skilled physical therapy intervention to address impairments, improve QOL, and attain therapy goals.     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   REHAB POTENTIAL: Good  CLINICAL DECISION MAKING: Evolving/moderate complexity  EVALUATION COMPLEXITY: Moderate  PLAN:  PT FREQUENCY: 1-2x/week  PT DURATION: 12 weeks  PLANNED INTERVENTIONS: [id number]- PT Re-evaluation, 97750- Physical Performance Testing, 97110-Therapeutic exercises, 97530- Therapeutic activity, [id number]- Neuromuscular re-education, 97535- Self Care, [id number]- Manual therapy, [id number]- Gait training, [phone]- Orthotic Initial, [phone]- Orthotic/Prosthetic subsequent, [phone]- Canalith repositioning, Patient/Family education, Balance training, Stair training, Taping, Joint mobilization, Spinal mobilization, Vestibular training, DME instructions, Wheelchair mobility training, Cryotherapy, and Moist heat  PLAN FOR NEXT  SESSION:    Progress gait with least restrict assistive device  Improve dynamic balance including narrowed BOS Progress overall LE strength especially R side glutes. Progress HEP as indicated.  Endurance training   Casilda Human, SPT   [facility]  3:11 PM 02/28/24

## 2024-03-05 ENCOUNTER — Ambulatory Visit

## 2024-03-05 ENCOUNTER — Encounter: Admitting: Speech Pathology

## 2024-03-05 DIAGNOSIS — M6281 Muscle weakness (generalized): Secondary | ICD-10-CM

## 2024-03-05 DIAGNOSIS — R2681 Unsteadiness on feet: Secondary | ICD-10-CM

## 2024-03-05 DIAGNOSIS — R278 Other lack of coordination: Secondary | ICD-10-CM

## 2024-03-05 DIAGNOSIS — R262 Difficulty in walking, not elsewhere classified: Secondary | ICD-10-CM

## 2024-03-05 NOTE — Therapy (Signed)
 OUTPATIENT PHYSICAL THERAPY NEURO TREATMENT Patient Name: [name] [name] MRN: [id number] DOB:08/05/1941, 83 y.o., male Today's Date: 03/05/2024   PCP: Rudy Alyce RAMAN, MD REFERRING PROVIDER:   Pegge Toribio PARAS, PA-C    END OF SESSION:  PT End of Session - 03/05/24 0800     Visit Number 24    Number of Visits 39    Date for PT Re-Evaluation 04/24/24    Progress Note Due on Visit 30    PT Start Time 0802    PT Stop Time 0845    PT Time Calculation (min) 43 min    Equipment Utilized During Treatment Gait belt    Activity Tolerance Patient tolerated treatment well    Behavior During Therapy Flat affect           Past Medical History:  Diagnosis Date   Arthritis    Depression    Diabetes mellitus without complication (HCC)    Gout    Hyperlipidemia    Hypertension    Ischemic cardiomyopathy    No past surgical history on file. 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   Ischemic cardiomyopathy 06/12/2018   Thrombocytosis 04/16/2016   Adjustment reaction with prolonged depressive reaction 01/22/2014   Visual loss, right eye 07/30/2012   Type 2 diabetes mellitus (HCC) 06/16/2011   Coronary artery disease 06/16/2011   Hypertension associated with diabetes (HCC) 06/16/2011    ONSET DATE: 11/16/2023  REFERRING DIAG: P36.487 (ICD-10-CM) - Acute ischemic left MCA stroke (HCC)   THERAPY DIAG:  Muscle weakness (generalized)  Difficulty in walking, not elsewhere classified  Unsteadiness on feet  Other lack of coordination  Rationale for Evaluation and Treatment: Rehabilitation  SUBJECTIVE:                                                                                                                                                                                             SUBJECTIVE STATEMENT:   Pt reports  he is doing well.  Pt states no falls or complications since the last time being seen.  Pt reports he controlled himself down the other day, due to the pain in his left knee, and he had a hard time getting up.     From eval: Pt is a pleasant 83 y/o male referred to PT s/p L MCA stroke. Pt also seeing speech as well. Pt with very weak voice, at times hx difficult to take as a result. He attempted to ambulate to clinic  with [id number] but became too fatigued and required assistance into WC. Pt now presents to PT eval in WC. He reports he primarily uses a 4WW at home, but does not ambulate much during the day (maybe ten minutes total). He reports difficulty with standing up from chairs. He must use grab bars in his bathroom due to difficulty standing up from toilet. He has difficulty with stairs, requires help getting up step to enter his home. He reports no recent falls, but that he does stumble with his 4WW when fatigued.  Pt accompanied by: self  PERTINENT HISTORY:  Via ED d/c note: Presented 11/16/2023 to [facility] with right facial droop left gaze preference and dysarthria. CT/MRI showed small acute nonhemorrhagic left MCA distribution infarction involving the left frontal lobe. No associated mass effect. Patient did not receive TNK. CTA showed no large vessel occlusion or evidence of finding amenable to neurovascular intervention...   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 No none currently but does report arthritis pain in L shoulder limiting ability to lift/raise arm  PRECAUTIONS: Fall  RED FLAGS: None   WEIGHT BEARING RESTRICTIONS: No  FALLS: Has patient fallen in last 6 months? No but does report stumbling with fatigue  LIVING ENVIRONMENT: Lives with: lives with their family son and DIL Lives in: House/apartment, two level home but not  using upstairs, stays on first floor Stairs: 1 step to enter home, says no handrails but he has help getting up step Has following equipment at home: Walker - 4 wheeled, shower chair, and Grab bars  PLOF: Independent  PATIENT GOALS: everything: walking, balance, strength   OBJECTIVE:  Note: Objective measures were completed at Evaluation unless otherwise noted.  DIAGNOSTIC FINDINGS:  imaging via chart  MR brain 11/16/23:  IMPRESSION: 1. Small acute nonhemorrhagic left MCA distribution infarct involving the left frontal lobe. No associated mass effect. 2. Additional punctate subcentimeter acute ischemic nonhemorrhagic infarct within the contralateral right basal ganglia. 3. Underlying moderately advanced chronic microvascular ischemic disease with a few scattered remote lacunar infarcts as above. 4. Chronic occlusion of the left vertebral artery.     Electronically Signed   By: Morene Hoard M.D.   On: 11/16/2023 23:48  CT ANGIO HEAD NECK 11/16/23  IMPRESSION: No large vessel occlusion or evidence of findings amenable to neurovascular intervention.   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   Multiple intracranial vascular stenoses as above. Focal short segment occlusion of an M2 inferior division branch of the right MCA with reconstitution noted.   Mild-to-moderate stenosis of the V4 segment right vertebral artery.   Emphysema (ICD10-J43.9).     Electronically Signed   By: Donnice Mania M.D.   On: 11/16/2023 15:50  CT HEAD 11/16/23: IMPRESSION: 1. 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 2. Subtle hypoattenuation at the anterior limb of the right internal capsule. 3. Subcortical white matter infarct in the anterior right frontal lobe superior to the frontal horn of the right lateral  ventricle. 4. Age indeterminate infarct in the left lentiform nucleus. 5. Moderate atrophy and white matter disease likely reflects the sequela of chronic microvascular ischemia. 6. No acute hemorrhage or mass lesion.   These results were called by telephone at the  time of interpretation on 11/16/2023 at 3:13 pm to provider Dr. Matthews, who verbally acknowledged these results.     Electronically Signed   By: Lonni Necessary M.D.   On: 11/16/2023 15:14  COGNITION: Overall cognitive status: Within functional limits for tasks assessed but at times difficult to fully determine due to weakness of voice and some difficulty responding to PT questions as a result   SENSATION: WFL to light touch bilat UE and bilat LE  COORDINATION: WFL rapid alt movement UE and finger chin<>target bilat  WFL rapid alt movement LE and heel>shin bilat  EDEMA:  Pt reports no swelling     POSTURE: slight rounded shoulders, forward head, and increased thoracic kyphosis   LOWER EXTREMITY MMT:    Gross bilat LE strength is 4/5, more weakness found proximal than distal   BED MOBILITY:  Not assessed  TRANSFERS: Initially min a, then pt able to complete CGA   STAIRS: Not assessed but impaired per pt report GAIT: Findings: decreased gait speed (See ), FWD flexed posture with gait, decreased bilat heel strike, more pronounced on R than L side  FUNCTIONAL TESTS:  5 times sit to stand: 44.2 sec with use UE  Timed up and go (TUG): 33 second with 4WW  10 meter walk test: 0.53 m/s with 4WW  Berg Balance Scale: deferred : deferred  PATIENT SURVEYS:  SIS-16: 54                                                                                                                              TREATMENT DATE:   Unless otherwise stated, CGA was provided and gait belt donned in order to ensure pt safety    TE- To improve strength, endurance, mobility, and function of specific targeted muscle  groups or improve joint range of motion or improve muscle flexibility   All exercises performed with UE support: Standing hip abduction with 3# AW donned, 2x10 each LE Standing hip extension with 3# AW donned, 2x10 each LE Standing hamstring curls with 3# AW donned, 2x10 each LE Standing marches with 3# AW donned, 2x10 each LE Standing heel raises with 3# AW donned, 2x10  Standing mini squats, 2x10    Gait training   Gait with 4WW, 3 laps in hallway, Good step length and cadence. VC to increase step height during second lap to prevent shuffling.   TA:  Fall training with red mat, use of verbal/visual/tactile feedback to improve technique.  Pt performed 2 total reps, however noted increased pain in the L knee with pressure.   Thessaly's and Joint Line Tenderness tests performed to check for meniscal tear as pt reports pain since twisting it last Friday. STS for functional improvement of the glutes necessary for proper gait mechanics, 2x10      PATIENT EDUCATION: Education details: Pt educated throughout session about proper posture and technique with exercises. Improved exercise technique, movement at target joints, use of target muscles after  min to mod verbal, visual, tactile cues.  Person educated: Patient Education method: Explanation, Demonstration, and Verbal cues Education comprehension: verbalized understanding and returned demonstration  HOME EXERCISE PROGRAM: Access Code: [email or web address] URL: [email or web address] Date: 01/30/2024 Prepared by: Massie Dollar  Exercises - Seated March  - 1 x daily - 7 x weekly - 2 sets - 10 reps - Seated Long Arc Quad  - 1 x daily - 7 x weekly - 2 sets - 10 reps - 3 sec hold - Standing Knee Flexion AROM with Chair Support  - 1 x daily - 7 x weekly - 2 sets - 10 reps - Side Stepping with Counter Support  - 1 x daily - 7 x weekly - 3 sets - 10 reps - Sit to Stand with Armchair  - 1 x daily - 7 x weekly - 3 sets - 10 reps -  Walking with Counter Support  - 1 x daily - 7 x weekly - 3 sets - 10 reps - Semi-Tandem Balance at [employer] Open  - 1 x daily - 7 x weekly - 2 sets - 4 reps - 15 seconds hold - Standing March with Counter Support  - 1 x daily - 7 x weekly - 3 sets - 10 reps  GOALS: Goals reviewed with patient? Yes  SHORT TERM GOALS: Target date: 01/18/2024  Patient will be independent in home exercise program to improve strength/mobility for better functional independence with ADLs. Baseline: 01/15/2024= Patient able to verbalize and demonstrate his seated HEP without prompting- states compliance.  Goal status: MET   LONG TERM GOALS: Target date: 04/24/2024    Patient will increase SIS-16 score by at least 10 points  to demonstrate increased ease with ADLs and quality of life.  Baseline: 54, 7/23: 51  Goal status: ONGOING  2.  Patient (> 51 years old) will complete five times sit to stand test in < 15 seconds indicating an increased LE strength and improved balance. Baseline: 44.2 sec with use of BUE; 01/15/2024= 36.46 sec with BUE Support (Still unable to rise without UE Support and some posterior lean- CGA)  7/14 22.08 sec with UE pushing from arm rest. 39.30 sec with no UE support and min assist to prevent posterior LOB. Goal status: PROGRESSING   3.  Patient will increase Berg Balance score by > 6 points to demonstrate decreased fall risk during functional activities Baseline: 34 7/14: 38 Goal status: ONGOING  4.  Patient will increase 10 meter walk test to >1.70m/s as to improve gait speed for better community ambulation and to reduce fall risk. Baseline: 0.53 m/s with 4WW; 01/15/2024= 0.58 m/s with 4WW 7/14: 0.43m/s  Goal status: PROGRESSING  5.  Patient will reduce timed up and go to <11 seconds to reduce fall risk and demonstrate improved transfer/gait ability. Baseline: 33 sec with 4WW; 01/15/2024= 25.42 sec avg with 4WW 7/14: 20.72 sec avg with 4WW   Goal status: PROGRESSING  6.  Patient will increase six minute walk test distance to >1000 for progression to community ambulator and improve gait ability Baseline: 310 feet using a 4WW with CGA and only completes 3:17 sec prior to requesting to sit secondary to fatigue; 01/15/2024= 610 feet in with 4WW 7/14: 669ft with 4WW   Goal status: PROGRESSING   ASSESSMENT:  CLINICAL IMPRESSION:  Pt continued to focus on strengthening of the LE's in order to improve overall tolerance to ambulation for greater distances, as well as increasing ability to perform transfers.  Pt  noted he had trouble getting off the floor at home, so pt was educated on how to perform during session with use of verbal and visual cuing for proper form.  Pt with increased L knee pain, so pt tested for meniscal involvement based on symptom response and was found to be negative.  Pt overall continues to make progress and will continue to benefit from skilled therapy to address remaining concerns/deficits.   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   REHAB POTENTIAL: Good  CLINICAL DECISION MAKING: Evolving/moderate complexity  EVALUATION COMPLEXITY: Moderate  PLAN:  PT FREQUENCY: 1-2x/week  PT DURATION: 12 weeks  PLANNED INTERVENTIONS: [id number]- PT Re-evaluation, 97750- Physical Performance Testing, 97110-Therapeutic exercises, 97530- Therapeutic activity, [id number]- Neuromuscular re-education, 97535- Self Care, [id number]- Manual therapy, [id number]- Gait training, [phone]- Orthotic Initial, [phone]- Orthotic/Prosthetic  subsequent, [phone]- Canalith repositioning, Patient/Family education, Balance training, Stair training, Taping, Joint mobilization, Spinal mobilization, Vestibular training, DME instructions, Wheelchair mobility training, Cryotherapy, and Moist heat  PLAN FOR NEXT SESSION:    Progress gait with least restrict assistive device  Improve dynamic balance including narrowed BOS Progress overall LE strength especially R side glutes. Progress HEP as indicated.  Endurance training  Fonda Simpers, PT, DPT Physical Therapist - [facility]  03/05/24, 1:42 PM

## 2024-03-07 ENCOUNTER — Encounter: Admitting: Speech Pathology

## 2024-03-07 ENCOUNTER — Ambulatory Visit

## 2024-03-07 DIAGNOSIS — R262 Difficulty in walking, not elsewhere classified: Secondary | ICD-10-CM

## 2024-03-07 DIAGNOSIS — R2681 Unsteadiness on feet: Secondary | ICD-10-CM

## 2024-03-07 DIAGNOSIS — R278 Other lack of coordination: Secondary | ICD-10-CM

## 2024-03-07 DIAGNOSIS — M6281 Muscle weakness (generalized): Secondary | ICD-10-CM

## 2024-03-07 NOTE — Therapy (Signed)
 OUTPATIENT PHYSICAL THERAPY TREATMENT Patient Name: [name] [name] MRN: [id number] DOB:[birth year], 83 y.o., male Today's Date: 03/07/2024  PCP: Rudy Alyce RAMAN, MD REFERRING PROVIDER:   Pegge Toribio PARAS, PA-C   END OF SESSION:  PT End of Session - 03/07/24 0856     Visit Number 25    Number of Visits 39    Date for PT Re-Evaluation 04/24/24    Authorization Time Period 7/23-9/17    Progress Note Due on Visit 30    PT Start Time 0845    PT Stop Time 0925    PT Time Calculation (min) 40 min    Equipment Utilized During Treatment Gait belt    Activity Tolerance Patient tolerated treatment well;No increased pain    Behavior During Therapy WFL for tasks assessed/performed         Past Medical History:  Diagnosis Date   Arthritis    Depression    Diabetes mellitus without complication (HCC)    Gout    Hyperlipidemia    Hypertension    Ischemic cardiomyopathy    No past surgical history on file. 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   Ischemic cardiomyopathy 06/12/2018   Thrombocytosis 04/16/2016   Adjustment reaction with prolonged depressive reaction 01/22/2014   Visual loss, right eye 07/30/2012   Type 2 diabetes mellitus (HCC) 06/16/2011   Coronary artery disease 06/16/2011   Hypertension associated with diabetes (HCC) 06/16/2011   ONSET DATE: 11/16/2023 REFERRING DIAG: P36.487 (ICD-10-CM) - Acute ischemic left MCA stroke (HCC)  THERAPY DIAG:  Muscle weakness (generalized)  Difficulty in walking, not elsewhere classified  Unsteadiness on feet  Other lack of coordination  Rationale for Evaluation and Treatment: Rehabilitation  SUBJECTIVE:                                                                                                                                                                                              SUBJECTIVE STATEMENT:  Pt doing fine today. Left knee pain much improved.   PERTINENT HISTORY: Pt is a pleasant 83 y/o male referred to PT s/p L MCA stroke. Presented 11/16/2023 to [facility] with right facial droop left gaze preference and dysarthria. CT/MRI showed small acute nonhemorrhagic left MCA distribution infarction involving the left frontal lobe. Pt admitted to rehab 11/19/2023 PT/ST/OT. Pt now presents to PT eval in WC. He reports he primarily uses a 4WW at home, but does not ambulate much during the day (maybe ten minutes total).  He reports difficulty with standing up from chairs. He must use grab bars in his bathroom due to difficulty standing up from toilet. He has difficulty with stairs, requires help getting up step to enter his home. He reports no recent falls, but that he does stumble with his 4WW when fatigued. 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 No none currently but does report arthritis pain in L shoulder limiting ability to lift/raise arm  PRECAUTIONS: Fall  RED FLAGS: None   WEIGHT BEARING RESTRICTIONS: No  FALLS: Has patient fallen in last 6 months? No but does report stumbling with fatigue  LIVING ENVIRONMENT: Lives with: lives with their family son and DIL Lives in: House/apartment, two level home but not using upstairs, stays on first floor Stairs: 1 step to enter home, says no handrails but he has help getting up step Has following equipment at home: Walker - 4 wheeled, shower chair, and Grab bars  PLOF: Independent  PATIENT GOALS: everything: walking, balance, strength   OBJECTIVE:  Note: Objective measures were completed at Evaluation unless otherwise noted.  DIAGNOSTIC FINDINGS: MR brain 11/16/23:  IMPRESSION: 1. Small acute nonhemorrhagic left MCA distribution infarct involving the left frontal lobe. No associated mass  effect. 2. Additional punctate subcentimeter acute ischemic nonhemorrhagic infarct within the contralateral right basal ganglia. 3. Underlying moderately advanced chronic microvascular ischemic disease with a few scattered remote lacunar infarcts as above. 4. Chronic occlusion of the left vertebral artery.    Electronically Signed   By: Morene Hoard M.D.   On: 11/16/2023 23:48  CT ANGIO HEAD NECK 11/16/23  IMPRESSION: No large vessel occlusion or evidence of findings amenable to neurovascular intervention.   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   Multiple intracranial vascular stenoses as above. Focal short segment occlusion of an M2 inferior division branch of the right MCA with reconstitution noted.   Mild-to-moderate stenosis of the V4 segment right vertebral artery.   Emphysema (ICD10-J43.9).     Electronically Signed   By: Donnice Mania M.D.   On: 11/16/2023 15:50  CT HEAD 11/16/23: IMPRESSION: 1. 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 2. Subtle hypoattenuation at the anterior limb of the right internal capsule. 3. 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 4. Age indeterminate infarct in the left lentiform nucleus. 5. Moderate atrophy and white matter disease likely reflects the sequela of chronic microvascular ischemia. 6. No acute hemorrhage or mass lesion.   These results were called by telephone at the time of interpretation on 11/16/2023 at 3:13 pm to provider Dr. Matthews, who verbally acknowledged these results.    Electronically Signed   By: Lonni Necessary M.D.   On: 11/16/2023 15:14                                                                                 TREATMENT DATE 03/07/24 :   -454ft  AMB Bari 4WW 60m 58sec -10xSTS from chair + airex, hands free, several LOB/false starts  -341ft AMB bari 4WW c 2lb AW bilat 14m38sec -10xSTS from chair + airex, hands free, several LOB/false starts -321ft AMB bari 4WW c 2lb AW bilat 108m52sec -10xSTS from chair + airex, hands free -AMB overground c bari 4WW x 13ft c 5;lb AW bilat  -AMB short distance without device, minGuardA to minA (balance intervention)   PATIENT EDUCATION: Education details: Pt educated throughout session about proper posture and technique with exercises. Improved exercise technique, movement at target joints, use of target muscles after min to mod verbal, visual, tactile cues.  Person educated: Patient Education method: Explanation, Demonstration, and Verbal cues Education comprehension: verbalized understanding and returned demonstration  HOME EXERCISE PROGRAM: Access Code: [email or web address] URL: [email or web address] Date: 01/30/2024 Prepared by: Massie Dollar  Exercises - Seated March  - 1 x daily - 7 x weekly - 2 sets - 10 reps - Seated Long Arc Quad  - 1 x daily - 7 x weekly - 2 sets - 10 reps - 3 sec hold - Standing Knee Flexion AROM with Chair Support  - 1 x daily - 7 x weekly - 2 sets - 10 reps - Side Stepping with Counter Support  - 1 x daily - 7 x weekly - 3 sets - 10 reps - Sit to Stand with Armchair  - 1 x daily - 7 x weekly - 3 sets - 10 reps - Walking with Counter Support  - 1 x daily - 7 x weekly - 3 sets - 10 reps - Semi-Tandem Balance at [employer] Open  - 1 x daily - 7 x weekly - 2 sets - 4 reps - 15 seconds hold - Standing March with Counter Support  - 1 x daily - 7 x weekly - 3 sets - 10 reps  GOALS: Goals reviewed with patient? Yes  SHORT TERM GOALS: Target date: 01/18/2024 Patient will be independent in home exercise program to improve strength/mobility for better functional independence with ADLs. Baseline: 01/15/2024= Patient able to verbalize and demonstrate his seated  HEP without prompting- states compliance.  Goal status: MET  LONG TERM GOALS: Target date: 04/24/2024 1. Patient will increase SIS-16 score by at least 10 points  to demonstrate increased ease with ADLs and quality of life.  Baseline: 54, 7/23: 51 Goal status: ONGOING  2.  Patient (> 37 years old) will complete five times sit to stand test in < 15 seconds indicating an increased LE strength and improved balance. Baseline: 44.2 sec with use of BUE; 01/15/2024= 36.46 sec with BUE Support (Still unable to rise without UE Support and some posterior lean- CGA)  7/14 22.08 sec with UE pushing from arm rest. 39.30 sec with no UE support and min assist to prevent posterior LOB. Goal status: PROGRESSING   3.  Patient will increase Berg Balance score by > 6 points to demonstrate decreased fall risk during functional activities Baseline: 34 7/14: 38 Goal status: ONGOING  4.  Patient will increase 10 meter walk test to >1.41m/s as to improve gait speed for better community ambulation and to reduce fall risk. Baseline: 0.53 m/s with 4WW; 01/15/2024= 0.58 m/s with 4WW 7/14: 0.87m/s  Goal status: PROGRESSING  5.  Patient will reduce timed up and go to <11 seconds to reduce fall risk and demonstrate improved transfer/gait ability. Baseline: 33 sec with 4WW; 01/15/2024= 25.42 sec avg with 4WW 7/14: 20.72 sec avg with 4WW  Goal status: PROGRESSING  6. Patient will increase six minute walk test distance to >1000 for progression to community ambulator and improve gait ability Baseline: 310 feet using a 4WW with CGA and only completes 3:17 sec prior to requesting to sit secondary to fatigue; 01/15/2024= 610 feet in with 4WW 7/14: 628ft with 4WW  Goal status: PROGRESSING   ASSESSMENT:  CLINICAL IMPRESSION: Continue to advance interventions to improve community level walking and motor control with transfers. Tried to incorporate several different walking durations and intensities. Pt is easily fatigued and  requires repeated recovery intervals. Pt overall continues to make progress and will continue to benefit from skilled therapy to address remaining concerns/deficits.   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   REHAB POTENTIAL: Good  CLINICAL DECISION MAKING: Evolving/moderate complexity  EVALUATION COMPLEXITY: Moderate  PLAN:  PT FREQUENCY: 1-2x/week  PT DURATION: 12 weeks  PLANNED INTERVENTIONS: 97164- PT Re-evaluation, 97750- Physical Performance Testing, 97110-Therapeutic exercises, 97530- Therapeutic activity, 97112- Neuromuscular re-education, 97535- Self Care, 02859- Manual therapy, [phone]- Gait training, [phone]- Orthotic Initial, [phone]- Orthotic/Prosthetic subsequent, [phone]- Canalith repositioning, Patient/Family education, Balance training, Stair training, Taping, Joint mobilization, Spinal mobilization, Vestibular training, DME instructions, Wheelchair mobility training, Cryotherapy, and Moist heat  PLAN FOR NEXT SESSION:   Bring attention to anterior comparetment weakness and motor control impairment; needs help with 5xSTS,  9:00 AM, 03/07/24 Peggye JAYSON Linear, PT, DPT Physical Therapist - [facility]  Outpatient Physical Therapy- Main Campus [phone]

## 2024-03-12 ENCOUNTER — Encounter: Admitting: Speech Pathology

## 2024-03-12 ENCOUNTER — Ambulatory Visit

## 2024-03-14 ENCOUNTER — Encounter: Admitting: Speech Pathology

## 2024-03-14 ENCOUNTER — Ambulatory Visit

## 2024-03-15 ENCOUNTER — Ambulatory Visit: Payer: Self-pay | Admitting: Student

## 2024-03-15 DIAGNOSIS — I639 Cerebral infarction, unspecified: Secondary | ICD-10-CM | POA: Diagnosis not present

## 2024-03-19 ENCOUNTER — Encounter: Admitting: Speech Pathology

## 2024-03-19 ENCOUNTER — Ambulatory Visit: Attending: Physician Assistant | Admitting: Physical Therapy

## 2024-03-19 DIAGNOSIS — R262 Difficulty in walking, not elsewhere classified: Secondary | ICD-10-CM | POA: Diagnosis present

## 2024-03-19 DIAGNOSIS — R2681 Unsteadiness on feet: Secondary | ICD-10-CM | POA: Insufficient documentation

## 2024-03-19 DIAGNOSIS — R278 Other lack of coordination: Secondary | ICD-10-CM | POA: Insufficient documentation

## 2024-03-19 DIAGNOSIS — M6281 Muscle weakness (generalized): Secondary | ICD-10-CM | POA: Diagnosis present

## 2024-03-19 DIAGNOSIS — I63512 Cerebral infarction due to unspecified occlusion or stenosis of left middle cerebral artery: Secondary | ICD-10-CM | POA: Insufficient documentation

## 2024-03-19 NOTE — Therapy (Signed)
 OUTPATIENT PHYSICAL THERAPY TREATMENT Patient Name: [name] [name] MRN: [id number] DOB:March 17, 1941, 83 y.o., male Today's Date: 03/19/2024  PCP: Rudy Alyce RAMAN, MD REFERRING PROVIDER:   Pegge Toribio PARAS, PA-C   END OF SESSION:  PT End of Session - 03/19/24 1027     Visit Number 26    Number of Visits 39    Date for PT Re-Evaluation 04/24/24    Authorization Time Period 7/23-9/17    Progress Note Due on Visit 30    PT Start Time 1018    PT Stop Time 1059    PT Time Calculation (min) 41 min    Equipment Utilized During Treatment Gait belt    Activity Tolerance Patient tolerated treatment well;No increased pain    Behavior During Therapy WFL for tasks assessed/performed          Past Medical History:  Diagnosis Date   Arthritis    Depression    Diabetes mellitus without complication (HCC)    Gout    Hyperlipidemia    Hypertension    Ischemic cardiomyopathy    No past surgical history on file. 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   Ischemic cardiomyopathy 06/12/2018   Thrombocytosis 04/16/2016   Adjustment reaction with prolonged depressive reaction 01/22/2014   Visual loss, right eye 07/30/2012   Type 2 diabetes mellitus (HCC) 06/16/2011   Coronary artery disease 06/16/2011   Hypertension associated with diabetes (HCC) 06/16/2011   ONSET DATE: 11/16/2023 REFERRING DIAG: P36.487 (ICD-10-CM) - Acute ischemic left MCA stroke (HCC)  THERAPY DIAG:  Difficulty in walking, not elsewhere classified  Unsteadiness on feet  Muscle weakness (generalized)  Rationale for Evaluation and Treatment: Rehabilitation  SUBJECTIVE:                                                                                                                                                                                              SUBJECTIVE STATEMENT:  Pt doing fine today. Left knee tweak caused missed visits last week, feeling better now.   PERTINENT HISTORY: Pt is a pleasant 83 y/o male referred to PT s/p L MCA stroke. Presented 11/16/2023 to [facility] with right facial droop left gaze preference and dysarthria. CT/MRI showed small acute nonhemorrhagic left MCA distribution infarction involving the left frontal lobe. Pt admitted to rehab 11/19/2023 PT/ST/OT. Pt now presents to PT eval in WC. He reports he primarily uses a 4WW at home, but does not ambulate much during the day (maybe ten  minutes total). He reports difficulty with standing up from chairs. He must use grab bars in his bathroom due to difficulty standing up from toilet. He has difficulty with stairs, requires help getting up step to enter his home. He reports no recent falls, but that he does stumble with his 4WW when fatigued. 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 No none currently but does report arthritis pain in L shoulder limiting ability to lift/raise arm  PRECAUTIONS: Fall  RED FLAGS: None   WEIGHT BEARING RESTRICTIONS: No  FALLS: Has patient fallen in last 6 months? No but does report stumbling with fatigue  LIVING ENVIRONMENT: Lives with: lives with their family son and DIL Lives in: House/apartment, two level home but not using upstairs, stays on first floor Stairs: 1 step to enter home, says no handrails but he has help getting up step Has following equipment at home: Walker - 4 wheeled, shower chair, and Grab bars  PLOF: Independent  PATIENT GOALS: everything: walking, balance, strength   OBJECTIVE:  Note: Objective measures were completed at Evaluation unless otherwise noted.  DIAGNOSTIC FINDINGS: MR brain 11/16/23:  IMPRESSION: 1. Small acute nonhemorrhagic left MCA distribution infarct involving the left frontal lobe. No  associated mass effect. 2. Additional punctate subcentimeter acute ischemic nonhemorrhagic infarct within the contralateral right basal ganglia. 3. Underlying moderately advanced chronic microvascular ischemic disease with a few scattered remote lacunar infarcts as above. 4. Chronic occlusion of the left vertebral artery.    Electronically Signed   By: Morene Hoard M.D.   On: 11/16/2023 23:48  CT ANGIO HEAD NECK 11/16/23  IMPRESSION: No large vessel occlusion or evidence of findings amenable to neurovascular intervention.   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   Multiple intracranial vascular stenoses as above. Focal short segment occlusion of an M2 inferior division branch of the right MCA with reconstitution noted.   Mild-to-moderate stenosis of the V4 segment right vertebral artery.   Emphysema (ICD10-J43.9).     Electronically Signed   By: Donnice Mania M.D.   On: 11/16/2023 15:50  CT HEAD 11/16/23: IMPRESSION: 1. 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 2. Subtle hypoattenuation at the anterior limb of the right internal capsule. 3. 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 4. Age indeterminate infarct in the left lentiform nucleus. 5. Moderate atrophy and white matter disease likely reflects the sequela of chronic microvascular ischemia. 6. No acute hemorrhage or mass lesion.   These results were called by telephone at the time of interpretation on 11/16/2023 at 3:13 pm to provider Dr. Matthews, who verbally acknowledged these results.    Electronically Signed   By: Lonni Necessary M.D.   On: 11/16/2023 15:14                                                                                 TREATMENT  DATE 03/19/24 :  TA- To improve functional movements patterns for everyday tasks   -Nustep level 3 x 6 min for B UE and LE reciprocal movement training  300 ft AMB Bari 4WW  -10xSTS from chair + airex, hands free, several LOB/false starts, cues for forward weight shift 450 ft AMB bari 4WW c 2.5lb AW  -8xSTS from chair + airex, hands free, min to mod A on last reps -339ft AMB bari 4WW c 2.5lb AW bilat -sidestepping with 2.5# AW x 20 steps ea direction  - minor LOB when stepping from balance bar to chair corrected with skilled min A from PT  Gait without device and transfer to transport chair to end session.   TA- To improve functional movements patterns for everyday tasks   LAQ 2 x 10 ea LE with 2.5# AW   PATIENT EDUCATION: Education details: Pt educated throughout session about proper posture and technique with exercises. Improved exercise technique, movement at target joints, use of target muscles after min to mod verbal, visual, tactile cues.  Person educated: Patient Education method: Explanation, Demonstration, and Verbal cues Education comprehension: verbalized understanding and returned demonstration  HOME EXERCISE PROGRAM: Access Code: [email or web address] URL: [email or web address] Date: 01/30/2024 Prepared by: Massie Dollar  Exercises - Seated March  - 1 x daily - 7 x weekly - 2 sets - 10 reps - Seated Long Arc Quad  - 1 x daily - 7 x weekly - 2 sets - 10 reps - 3 sec hold - Standing Knee Flexion AROM with Chair Support  - 1 x daily - 7 x weekly - 2 sets - 10 reps - Side Stepping with Counter Support  - 1 x daily - 7 x weekly - 3 sets - 10 reps - Sit to Stand with Armchair  - 1 x daily - 7 x weekly - 3 sets - 10 reps - Walking with Counter Support  - 1 x daily - 7 x weekly - 3 sets - 10 reps - Semi-Tandem Balance at [employer] Open  - 1 x daily - 7 x weekly - 2 sets - 4 reps - 15 seconds hold - Standing March with Counter Support  - 1 x daily - 7 x weekly - 3  sets - 10 reps  GOALS: Goals reviewed with patient? Yes  SHORT TERM GOALS: Target date: 01/18/2024 Patient will be independent in home exercise program to improve strength/mobility for better functional independence with ADLs. Baseline: 01/15/2024= Patient able to verbalize and demonstrate his seated HEP without prompting- states compliance.  Goal status: MET  LONG TERM GOALS: Target date: 04/24/2024 1. Patient will increase SIS-16 score by at least 10 points  to demonstrate increased ease with ADLs and quality of life.  Baseline: 54, 7/23: 51 Goal status: ONGOING  2.  Patient (> 49 years old) will complete five times sit to stand test in < 15 seconds indicating an increased LE strength and improved balance. Baseline: 44.2 sec with use of BUE; 01/15/2024= 36.46 sec with BUE Support (Still unable to rise without UE Support and some posterior lean- CGA)  7/14 22.08 sec with UE pushing from arm rest. 39.30 sec with no UE support and min assist to prevent posterior LOB. Goal status: PROGRESSING   3.  Patient will increase Berg Balance score by > 6 points to demonstrate decreased fall risk during functional activities Baseline: 34 7/14: 38 Goal status: ONGOING  4.  Patient will increase 10 meter walk test to >1.67m/s as to improve gait speed for better  community ambulation and to reduce fall risk. Baseline: 0.53 m/s with 4WW; 01/15/2024= 0.58 m/s with 4WW 7/14: 0.44m/s  Goal status: PROGRESSING  5.  Patient will reduce timed up and go to <11 seconds to reduce fall risk and demonstrate improved transfer/gait ability. Baseline: 33 sec with 4WW; 01/15/2024= 25.42 sec avg with 4WW 7/14: 20.72 sec avg with 4WW  Goal status: PROGRESSING  6. Patient will increase six minute walk test distance to >1000 for progression to community ambulator and improve gait ability Baseline: 310 feet using a 4WW with CGA and only completes 3:17 sec prior to requesting to sit secondary to fatigue; 01/15/2024= 610 feet in  with 4WW 7/14: 679ft with 4WW  Goal status: PROGRESSING   ASSESSMENT:  CLINICAL IMPRESSION:  Continue to advance interventions to improve community level walking and motor control with transfers.  Patient tolerated all ambulatory interventions well although he did have 1 loss of balance when transitioning from holding support part to walking without assistance to sit in his chair.  Patient still significant difficulty with anterior weight shift and with lower extremity strength and power production in order to perform sit to stand without upper extremity assist. Pt will continue to benefit from skilled physical therapy intervention to address impairments, improve QOL, and attain therapy goals.     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   REHAB POTENTIAL: Good  CLINICAL DECISION MAKING: Evolving/moderate complexity  EVALUATION COMPLEXITY: Moderate  PLAN:  PT FREQUENCY: 1-2x/week  PT DURATION: 12 weeks  PLANNED INTERVENTIONS: 97164- PT Re-evaluation, 97750- Physical Performance Testing, 97110-Therapeutic exercises, 97530- Therapeutic activity, 97112- Neuromuscular re-education, 97535- Self Care, 02859- Manual therapy, [phone]- Gait training, [phone]- Orthotic Initial, [phone]- Orthotic/Prosthetic subsequent, [phone]- Canalith repositioning, Patient/Family education, Balance training, Stair training, Taping, Joint mobilization, Spinal mobilization, Vestibular training, DME instructions, Wheelchair mobility training, Cryotherapy, and Moist  heat  PLAN FOR NEXT SESSION:   Bring attention to anterior comparetment weakness and motor control impairment; needs help with 5xSTS,  10:27 AM, 03/19/24 Note: Portions of this document were prepared using Dragon voice recognition software and although reviewed may contain unintentional dictation errors in syntax, grammar, or spelling.  Lonni KATHEE Gainer PT ,DPT Physical Therapist- [place]  [facility]

## 2024-03-21 ENCOUNTER — Other Ambulatory Visit: Payer: Self-pay | Admitting: Physical Medicine and Rehabilitation

## 2024-03-21 ENCOUNTER — Ambulatory Visit

## 2024-03-21 ENCOUNTER — Encounter: Admitting: Speech Pathology

## 2024-03-21 ENCOUNTER — Other Ambulatory Visit: Payer: Self-pay

## 2024-03-21 DIAGNOSIS — R278 Other lack of coordination: Secondary | ICD-10-CM

## 2024-03-21 DIAGNOSIS — M6281 Muscle weakness (generalized): Secondary | ICD-10-CM

## 2024-03-21 DIAGNOSIS — R262 Difficulty in walking, not elsewhere classified: Secondary | ICD-10-CM | POA: Diagnosis not present

## 2024-03-21 DIAGNOSIS — R2681 Unsteadiness on feet: Secondary | ICD-10-CM

## 2024-03-21 MED ORDER — AMANTADINE HCL 100 MG PO CAPS
100.0000 mg | ORAL_CAPSULE | Freq: Every day | ORAL | 0 refills | Status: DC
  Filled 2024-03-21 – 2024-03-22 (×2): qty 30, 30d supply, fill #0

## 2024-03-21 MED ORDER — ROSUVASTATIN CALCIUM 20 MG PO TABS
20.0000 mg | ORAL_TABLET | Freq: Every day | ORAL | 0 refills | Status: DC
  Filled 2024-03-21 – 2024-03-22 (×2): qty 30, 30d supply, fill #0

## 2024-03-21 MED ORDER — METOPROLOL TARTRATE 25 MG PO TABS
25.0000 mg | ORAL_TABLET | Freq: Two times a day (BID) | ORAL | 0 refills | Status: DC
  Filled 2024-03-21 – 2024-03-22 (×2): qty 60, 30d supply, fill #0

## 2024-03-21 MED ORDER — LOSARTAN POTASSIUM 50 MG PO TABS
50.0000 mg | ORAL_TABLET | Freq: Every day | ORAL | 0 refills | Status: DC
Start: 2024-03-21 — End: 2024-04-22
  Filled 2024-03-21 – 2024-03-22 (×2): qty 30, 30d supply, fill #0

## 2024-03-21 MED ORDER — TRAZODONE HCL 50 MG PO TABS
50.0000 mg | ORAL_TABLET | Freq: Every day | ORAL | 0 refills | Status: DC
  Filled 2024-03-21 – 2024-03-22 (×2): qty 30, 30d supply, fill #0

## 2024-03-21 MED ORDER — CLOPIDOGREL BISULFATE 75 MG PO TABS
75.0000 mg | ORAL_TABLET | Freq: Every day | ORAL | 0 refills | Status: DC
  Filled 2024-03-21 – 2024-03-22 (×2): qty 30, 30d supply, fill #0

## 2024-03-21 NOTE — Therapy (Signed)
 OUTPATIENT PHYSICAL THERAPY TREATMENT Patient Name: [name] [name] MRN: [id number] DOB:21-Jun-1941, 83 y.o., male Today's Date: 03/21/2024  PCP: Rudy Alyce RAMAN, MD REFERRING PROVIDER:   Pegge Toribio PARAS, PA-C   END OF SESSION:  PT End of Session - 03/21/24 0900     Visit Number 27    Number of Visits 39    Date for PT Re-Evaluation 04/24/24    Progress Note Due on Visit 30    PT Start Time 0848   late arrival   PT Stop Time 0928    PT Time Calculation (min) 40 min    Equipment Utilized During Treatment Gait belt    Activity Tolerance Patient tolerated treatment well;No increased pain    Behavior During Therapy WFL for tasks assessed/performed          Past Medical History:  Diagnosis Date   Arthritis    Depression    Diabetes mellitus without complication (HCC)    Gout    Hyperlipidemia    Hypertension    Ischemic cardiomyopathy    No past surgical history on file. 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   Ischemic cardiomyopathy 06/12/2018   Thrombocytosis 04/16/2016   Adjustment reaction with prolonged depressive reaction 01/22/2014   Visual loss, right eye 07/30/2012   Type 2 diabetes mellitus (HCC) 06/16/2011   Coronary artery disease 06/16/2011   Hypertension associated with diabetes (HCC) 06/16/2011   ONSET DATE: 11/16/2023 REFERRING DIAG: P36.487 (ICD-10-CM) - Acute ischemic left MCA stroke (HCC)  THERAPY DIAG:  Difficulty in walking, not elsewhere classified  Unsteadiness on feet  Muscle weakness (generalized)  Other lack of coordination  Rationale for Evaluation and Treatment: Rehabilitation  SUBJECTIVE:                                                                                                                                                                                              SUBJECTIVE STATEMENT:  Pt doing well. Knee still feeling much improved. Has not been walking a lot as his house is not conducive for this.   PERTINENT HISTORY: Pt is a pleasant 83 y/o male referred to PT s/p L MCA stroke. Presented 11/16/2023 to [facility] with right facial droop left gaze preference and dysarthria. CT/MRI showed small acute nonhemorrhagic left MCA distribution infarction involving the left frontal lobe. Pt admitted to rehab 11/19/2023 PT/ST/OT. Pt now presents to PT eval in WC. He reports he primarily uses a 4WW at home, but does  not ambulate much during the day (maybe ten minutes total). He reports difficulty with standing up from chairs. He must use grab bars in his bathroom due to difficulty standing up from toilet. He has difficulty with stairs, requires help getting up step to enter his home. He reports no recent falls, but that he does stumble with his 4WW when fatigued. 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  PAIN:  Are you having pain? No- knee continues to feel much improved.   PRECAUTIONS: Fall  WEIGHT BEARING RESTRICTIONS: No  FALLS: Has patient fallen in last 6 months? No but does report stumbling with fatigue  LIVING ENVIRONMENT: Lives with: lives with their family son and DIL Lives in: House/apartment, two level home but not using upstairs, stays on first floor Stairs: 1 step to enter home, says no handrails but he has help getting up step Has following equipment at home: Walker - 4 wheeled, shower chair, and Grab bars  PLOF: Independent  PATIENT GOALS: everything: walking, balance, strength   OBJECTIVE:  Note: Objective measures were completed at Evaluation unless otherwise noted.  DIAGNOSTIC FINDINGS: MR brain 11/16/23:  IMPRESSION: 1. Small acute nonhemorrhagic left MCA distribution infarct involving the left frontal lobe. No associated mass effect. 2. Additional punctate  subcentimeter acute ischemic nonhemorrhagic infarct within the contralateral right basal ganglia. 3. Underlying moderately advanced chronic microvascular ischemic disease with a few scattered remote lacunar infarcts as above. 4. Chronic occlusion of the left vertebral artery.    Electronically Signed   By: Morene Hoard M.D.   On: 11/16/2023 23:48  CT ANGIO HEAD NECK 11/16/23  IMPRESSION: No large vessel occlusion or evidence of findings amenable to neurovascular intervention.   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   Multiple intracranial vascular stenoses as above. Focal short segment occlusion of an M2 inferior division branch of the right MCA with reconstitution noted.   Mild-to-moderate stenosis of the V4 segment right vertebral artery.   Emphysema (ICD10-J43.9).     Electronically Signed   By: Donnice Mania M.D.   On: 11/16/2023 15:50  CT HEAD 11/16/23: IMPRESSION: 1. 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 2. Subtle hypoattenuation at the anterior limb of the right internal capsule. 3. 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 4. Age indeterminate infarct in the left lentiform nucleus. 5. Moderate atrophy and white matter disease likely reflects the sequela of chronic microvascular ischemia. 6. No acute hemorrhage or mass lesion.   These results were called by telephone at the time of interpretation on 11/16/2023 at 3:13 pm to provider Dr. Matthews, who verbally acknowledged these results.    Electronically Signed   By: Lonni Necessary M.D.   On: 11/16/2023 15:14                                                                                 TREATMENT DATE 03/21/24 :   -AMB 2 laps over  LL, 968ft,  c bari4WW; supervision level 0.80m/s total pace, 0.38m/s 1st lap -12xSTS from chair + airex pad (hands free or hands on knees) frequent LOB, does not default to knees behind toes, but performs better when cued (struggles with trunk righting in sagittal plane)  -12xSTS from chair + airex  -30sec static balance on rocker board incline 8 degrees, hands free ( -lateral side stepping in // bars 5 laps c 2 hand support  -30sec static balance on rocker board incline 17 degrees, hands free (30sec)  -lateral side stepping in // bars 5 laps c 1 hand support (to challenge trunk control of balance) -airex pad balance stance with wand flexion x15, then horizontal head turns x8  PATIENT EDUCATION: Education details: Pt educated throughout session about proper posture and technique with exercises. Improved exercise technique, movement at target joints, use of target muscles after min to mod verbal, visual, tactile cues.  Person educated: Patient Education method: Explanation, Demonstration, and Verbal cues Education comprehension: verbalized understanding and returned demonstration  HOME EXERCISE PROGRAM: Access Code: [email or web address] URL: [email or web address] Date: 01/30/2024 Prepared by: Massie Dollar  Exercises - Seated March  - 1 x daily - 7 x weekly - 2 sets - 10 reps - Seated Long Arc Quad  - 1 x daily - 7 x weekly - 2 sets - 10 reps - 3 sec hold - Standing Knee Flexion AROM with Chair Support  - 1 x daily - 7 x weekly - 2 sets - 10 reps - Side Stepping with Counter Support  - 1 x daily - 7 x weekly - 3 sets - 10 reps - Sit to Stand with Armchair  - 1 x daily - 7 x weekly - 3 sets - 10 reps - Walking with Counter Support  - 1 x daily - 7 x weekly - 3 sets - 10 reps - Semi-Tandem Balance at [employer] Open  - 1 x daily - 7 x weekly - 2 sets - 4 reps - 15 seconds hold - Standing March with Counter Support  - 1 x daily - 7 x weekly - 3 sets - 10 reps  GOALS: Goals reviewed with  patient? Yes  SHORT TERM GOALS: Target date: 01/18/2024 Patient will be independent in home exercise program to improve strength/mobility for better functional independence with ADLs. Baseline: 01/15/2024= Patient able to verbalize and demonstrate his seated HEP without prompting- states compliance.  Goal status: MET  LONG TERM GOALS: Target date: 04/24/2024 1. Patient will increase SIS-16 score by at least 10 points  to demonstrate increased ease with ADLs and quality of life.  Baseline: 54, 7/23: 51 Goal status: ONGOING  2.  Patient (> 67 years old) will complete five times sit to stand test in < 15 seconds indicating an increased LE strength and improved balance. Baseline: 44.2 sec with use of BUE; 01/15/2024= 36.46 sec with BUE Support (Still unable to rise without UE Support and some posterior lean- CGA)  7/14 22.08 sec with UE pushing from arm rest. 39.30 sec with no UE support and min assist to prevent posterior LOB. Goal status: PROGRESSING   3.  Patient will increase Berg Balance score by > 6 points to demonstrate decreased fall risk during functional activities Baseline: 34 7/14: 38 Goal status: ONGOING  4.  Patient will increase 10 meter walk test to >1.1m/s as to improve gait speed for better community ambulation and to reduce fall risk. Baseline: 0.53 m/s with 4WW; 01/15/2024= 0.58 m/s with 5TT 7/14:  0.65m/s  Goal status: PROGRESSING  5.  Patient will reduce timed up and go to <11 seconds to reduce fall risk and demonstrate improved transfer/gait ability. Baseline: 33 sec with 4WW; 01/15/2024= 25.42 sec avg with 4WW 7/14: 20.72 sec avg with 4WW  Goal status: PROGRESSING  6. Patient will increase six minute walk test distance to >1000 for progression to community ambulator and improve gait ability Baseline: 310 feet using a 4WW with CGA and only completes 3:17 sec prior to requesting to sit secondary to fatigue; 01/15/2024= 610 feet in with 4WW 7/14: 636ft with 4WW; 03/21/24:  98ft 61m13sec (achieved a negative split)  Goal status: PROGRESSING  ASSESSMENT:  CLINICAL IMPRESSION:  Continued to advance interventions to improve community level walking and motor control with transfers. Pt making steady albeit slow progress. Patient tolerated all ambulatory interventions well able to advance to 992ft walk to start. Pt remains steadfast in his transfers motor patterns, hesitance to bring feet posterior prior to transfers, but as is typical does perform better after cues and correction. Patient still significant difficulty with anterior weight shift and with lower extremity strength and power production in order to perform sit to stand without upper extremity assist. Pt will continue to benefit from skilled physical therapy intervention to address impairments, improve QOL, and attain therapy goals.    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   REHAB POTENTIAL: Good  CLINICAL DECISION MAKING: Evolving/moderate complexity  EVALUATION COMPLEXITY: Moderate  PLAN:  PT FREQUENCY: 1-2x/week  PT DURATION: 12 weeks  PLANNED INTERVENTIONS: 97164- PT Re-evaluation, 97750- Physical Performance Testing, 97110-Therapeutic exercises, 97530- Therapeutic activity, 97112- Neuromuscular re-education, 97535- Self Care, 02859- Manual therapy, [phone]- Gait training, [phone]- Orthotic Initial, [phone]- Orthotic/Prosthetic subsequent, [phone]- Canalith repositioning, Patient/Family education, Balance training, Stair training, Taping, Joint mobilization,  Spinal mobilization, Vestibular training, DME instructions, Wheelchair mobility training, Cryotherapy, and Moist heat  PLAN FOR NEXT SESSION:   Bring attention to anterior comparetment weakness and motor control impairment; needs help with 5xSTS,   9:01 AM, 03/21/24 Peggye JAYSON Linear, PT, DPT Physical Therapist - [facility]  Outpatient Physical Therapy- [facility] [phone]

## 2024-03-22 ENCOUNTER — Other Ambulatory Visit: Payer: Self-pay

## 2024-03-26 ENCOUNTER — Encounter: Admitting: Speech Pathology

## 2024-03-26 ENCOUNTER — Ambulatory Visit: Admitting: Physical Therapy

## 2024-03-26 DIAGNOSIS — M6281 Muscle weakness (generalized): Secondary | ICD-10-CM

## 2024-03-26 DIAGNOSIS — R2681 Unsteadiness on feet: Secondary | ICD-10-CM

## 2024-03-26 DIAGNOSIS — R262 Difficulty in walking, not elsewhere classified: Secondary | ICD-10-CM

## 2024-03-26 NOTE — Therapy (Signed)
 OUTPATIENT PHYSICAL THERAPY TREATMENT Patient Name: [name] [name] MRN: [id number] DOB:1941/01/17, 83 y.o., male Today's Date: 03/26/2024  PCP: Rudy Alyce RAMAN, MD REFERRING PROVIDER:   Pegge Toribio PARAS, PA-C   END OF SESSION:  PT End of Session - 03/26/24 1051     Visit Number 28    Number of Visits 39    Date for PT Re-Evaluation 04/24/24    Progress Note Due on Visit 30    PT Start Time 1019    PT Stop Time 1057    PT Time Calculation (min) 38 min    Equipment Utilized During Treatment Gait belt    Activity Tolerance Patient tolerated treatment well;No increased pain    Behavior During Therapy WFL for tasks assessed/performed           Past Medical History:  Diagnosis Date   Arthritis    Depression    Diabetes mellitus without complication (HCC)    Gout    Hyperlipidemia    Hypertension    Ischemic cardiomyopathy    No past surgical history on file. 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   Ischemic cardiomyopathy 06/12/2018   Thrombocytosis 04/16/2016   Adjustment reaction with prolonged depressive reaction 01/22/2014   Visual loss, right eye 07/30/2012   Type 2 diabetes mellitus (HCC) 06/16/2011   Coronary artery disease 06/16/2011   Hypertension associated with diabetes (HCC) 06/16/2011   ONSET DATE: 11/16/2023 REFERRING DIAG: P36.487 (ICD-10-CM) - Acute ischemic left MCA stroke (HCC)  THERAPY DIAG:  Difficulty in walking, not elsewhere classified  Unsteadiness on feet  Muscle weakness (generalized)  Rationale for Evaluation and Treatment: Rehabilitation  SUBJECTIVE:                                                                                                                                                                                             SUBJECTIVE STATEMENT:  Pt doing well. Knee  still feeling much improved. No new issues to report at this time.   PERTINENT HISTORY: Pt is a pleasant 83 y/o male referred to PT s/p L MCA stroke. Presented 11/16/2023 to [facility] with right facial droop left gaze preference and dysarthria. CT/MRI showed small acute nonhemorrhagic left MCA distribution infarction involving the left frontal lobe. Pt admitted to rehab 11/19/2023 PT/ST/OT. Pt now presents to PT eval in WC. He reports he primarily uses a 4WW at home, but does not ambulate much during the day (maybe ten minutes total). He reports difficulty  with standing up from chairs. He must use grab bars in his bathroom due to difficulty standing up from toilet. He has difficulty with stairs, requires help getting up step to enter his home. He reports no recent falls, but that he does stumble with his 4WW when fatigued. 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  PAIN:  Are you having pain? No- knee continues to feel much improved.   PRECAUTIONS: Fall  WEIGHT BEARING RESTRICTIONS: No  FALLS: Has patient fallen in last 6 months? No but does report stumbling with fatigue  LIVING ENVIRONMENT: Lives with: lives with their family son and DIL Lives in: House/apartment, two level home but not using upstairs, stays on first floor Stairs: 1 step to enter home, says no handrails but he has help getting up step Has following equipment at home: Walker - 4 wheeled, shower chair, and Grab bars  PLOF: Independent  PATIENT GOALS: everything: walking, balance, strength   OBJECTIVE:  Note: Objective measures were completed at Evaluation unless otherwise noted.  DIAGNOSTIC FINDINGS: MR brain 11/16/23:  IMPRESSION: 1. Small acute nonhemorrhagic left MCA distribution infarct involving the left frontal lobe. No associated mass effect. 2. Additional punctate subcentimeter acute ischemic nonhemorrhagic infarct within the contralateral  right basal ganglia. 3. Underlying moderately advanced chronic microvascular ischemic disease with a few scattered remote lacunar infarcts as above. 4. Chronic occlusion of the left vertebral artery.    Electronically Signed   By: Morene Hoard M.D.   On: 11/16/2023 23:48  CT ANGIO HEAD NECK 11/16/23  IMPRESSION: No large vessel occlusion or evidence of findings amenable to neurovascular intervention.   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   Multiple intracranial vascular stenoses as above. Focal short segment occlusion of an M2 inferior division branch of the right MCA with reconstitution noted.   Mild-to-moderate stenosis of the V4 segment right vertebral artery.   Emphysema (ICD10-J43.9).     Electronically Signed   By: Donnice Mania M.D.   On: 11/16/2023 15:50  CT HEAD 11/16/23: IMPRESSION: 1. 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 2. Subtle hypoattenuation at the anterior limb of the right internal capsule. 3. 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 4. Age indeterminate infarct in the left lentiform nucleus. 5. Moderate atrophy and white matter disease likely reflects the sequela of chronic microvascular ischemia. 6. No acute hemorrhage or mass lesion.   These results were called by telephone at the time of interpretation on 11/16/2023 at 3:13 pm to provider Dr. Matthews, who verbally acknowledged these results.    Electronically Signed   By: Lonni Necessary M.D.   On: 11/16/2023 15:14                                                                                 TREATMENT DATE 03/26/24 :   -AMB 2 laps over LL, 948ft, c bari4WW; supervision level  -2*10STS from elevated mat table, cues  for  weight shift - stance on 17 degree rocker board angle 3 x 30 sec, maintains balance but cues for posture to minimize kyphosis in T Spine and C spine -lateral side stepping along mat table without UE assist x 3 laps  - rest  -lateral side stepping along mat table without UE assist x 3 laps  -  lateral side step along mat table with 1 STS each sidestep x 1 lap ( 8 reps) Stance on airex adducted feet x 1 min  - transfer to transport chair to finish   PATIENT EDUCATION: Education details: Pt educated throughout session about proper posture and technique with exercises. Improved exercise technique, movement at target joints, use of target muscles after min to mod verbal, visual, tactile cues.  Person educated: Patient Education method: Explanation, Demonstration, and Verbal cues Education comprehension: verbalized understanding and returned demonstration  HOME EXERCISE PROGRAM: Access Code: [email or web address] URL: [email or web address] Date: 01/30/2024 Prepared by: Massie Dollar  Exercises - Seated March  - 1 x daily - 7 x weekly - 2 sets - 10 reps - Seated Long Arc Quad  - 1 x daily - 7 x weekly - 2 sets - 10 reps - 3 sec hold - Standing Knee Flexion AROM with Chair Support  - 1 x daily - 7 x weekly - 2 sets - 10 reps - Side Stepping with Counter Support  - 1 x daily - 7 x weekly - 3 sets - 10 reps - Sit to Stand with Armchair  - 1 x daily - 7 x weekly - 3 sets - 10 reps - Walking with Counter Support  - 1 x daily - 7 x weekly - 3 sets - 10 reps - Semi-Tandem Balance at [employer] Open  - 1 x daily - 7 x weekly - 2 sets - 4 reps - 15 seconds hold - Standing March with Counter Support  - 1 x daily - 7 x weekly - 3 sets - 10 reps  GOALS: Goals reviewed with patient? Yes  SHORT TERM GOALS: Target date: 01/18/2024 Patient will be independent in home exercise program to improve strength/mobility for better functional independence with ADLs. Baseline: 01/15/2024= Patient able to  verbalize and demonstrate his seated HEP without prompting- states compliance.  Goal status: MET  LONG TERM GOALS: Target date: 04/24/2024 1. Patient will increase SIS-16 score by at least 10 points  to demonstrate increased ease with ADLs and quality of life.  Baseline: 54, 7/23: 51 Goal status: ONGOING  2.  Patient (> 59 years old) will complete five times sit to stand test in < 15 seconds indicating an increased LE strength and improved balance. Baseline: 44.2 sec with use of BUE; 01/15/2024= 36.46 sec with BUE Support (Still unable to rise without UE Support and some posterior lean- CGA)  7/14 22.08 sec with UE pushing from arm rest. 39.30 sec with no UE support and min assist to prevent posterior LOB. Goal status: PROGRESSING   3.  Patient will increase Berg Balance score by > 6 points to demonstrate decreased fall risk during functional activities Baseline: 34 7/14: 38 Goal status: ONGOING  4.  Patient will increase 10 meter walk test to >1.87m/s as to improve gait speed for better community ambulation and to reduce fall risk. Baseline: 0.53 m/s with 4WW; 01/15/2024= 0.58 m/s with 4WW 7/14: 0.61m/s  Goal status: PROGRESSING  5.  Patient will reduce timed up and go to <11 seconds to reduce fall risk and demonstrate improved transfer/gait  ability. Baseline: 33 sec with 4WW; 01/15/2024= 25.42 sec avg with 4WW 7/14: 20.72 sec avg with 4WW  Goal status: PROGRESSING  6. Patient will increase six minute walk test distance to >1000 for progression to community ambulator and improve gait ability Baseline: 310 feet using a 4WW with CGA and only completes 3:17 sec prior to requesting to sit secondary to fatigue; 01/15/2024= 610 feet in with 4WW 7/14: 651ft with 4WW; 03/21/24: 970ft 6m13sec (achieved a negative split)  Goal status: PROGRESSING  ASSESSMENT:  CLINICAL IMPRESSION:  Patient presents with good motivation for completion of physical therapy activities.  Patient continues with  functional ambulation training as well as functional movement training with sit to stand and sidestepping.  Patient progressed to sidestepping without upper extremity support as well as combined multi faceted movement with sit to stand and sidestepping showing good endurance and balance with this activity with no overt loss of balance.Pt will continue to benefit from skilled physical therapy intervention to address impairments, improve QOL, and attain therapy goals.     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   REHAB POTENTIAL: Good  CLINICAL DECISION MAKING: Evolving/moderate complexity  EVALUATION COMPLEXITY: Moderate  PLAN:  PT FREQUENCY: 1-2x/week  PT DURATION: 12 weeks  PLANNED INTERVENTIONS: 97164- PT Re-evaluation, 97750- Physical Performance Testing, 97110-Therapeutic exercises, 97530- Therapeutic activity, 97112- Neuromuscular re-education, 97535- Self Care, 02859- Manual therapy, [phone]- Gait training, [phone]- Orthotic Initial, [phone]- Orthotic/Prosthetic subsequent, [phone]- Canalith repositioning, Patient/Family education, Balance training, Stair training, Taping, Joint mobilization, Spinal mobilization, Vestibular training, DME instructions, Wheelchair mobility training, Cryotherapy, and Moist heat  PLAN FOR NEXT SESSION:   Bring attention to anterior comparetment weakness and motor control impairment; needs help with 5xSTS,   12:27 PM, 03/26/24 Note: Portions of this document were prepared using Dragon voice recognition software  and although reviewed may contain unintentional dictation errors in syntax, grammar, or spelling.  Lonni KATHEE Gainer PT ,DPT Physical Therapist- [place]  [facility]

## 2024-03-28 ENCOUNTER — Telehealth: Payer: Self-pay | Admitting: Cardiology

## 2024-03-28 ENCOUNTER — Encounter: Admitting: Speech Pathology

## 2024-03-28 ENCOUNTER — Ambulatory Visit

## 2024-03-28 DIAGNOSIS — R262 Difficulty in walking, not elsewhere classified: Secondary | ICD-10-CM | POA: Diagnosis not present

## 2024-03-28 DIAGNOSIS — R278 Other lack of coordination: Secondary | ICD-10-CM

## 2024-03-28 DIAGNOSIS — R2681 Unsteadiness on feet: Secondary | ICD-10-CM

## 2024-03-28 DIAGNOSIS — M6281 Muscle weakness (generalized): Secondary | ICD-10-CM

## 2024-03-28 NOTE — Telephone Encounter (Signed)
 Reviewed results and recommendations. Scheduled patient to come in for the ILR with Dr. Cindie. Will route to nurse for her review.

## 2024-03-28 NOTE — Telephone Encounter (Signed)
 Son is returning call regarding monitor results.

## 2024-03-28 NOTE — Telephone Encounter (Signed)
 Left voicemail message to call back

## 2024-03-28 NOTE — Therapy (Signed)
 OUTPATIENT PHYSICAL THERAPY TREATMENT Patient Name: [name] [name] MRN: [id number] DOB:Apr 16, 1941, 83 y.o., male Today's Date: 03/28/2024  PCP: Rudy Alyce RAMAN, MD REFERRING PROVIDER:   Pegge Toribio PARAS, PA-C   END OF SESSION:  PT End of Session - 03/28/24 1015     Visit Number 29    Number of Visits 39    Date for PT Re-Evaluation 04/24/24    Progress Note Due on Visit 30    PT Start Time 1016    PT Stop Time 1100    PT Time Calculation (min) 44 min    Equipment Utilized During Treatment Gait belt    Activity Tolerance Patient tolerated treatment well;No increased pain    Behavior During Therapy WFL for tasks assessed/performed           Past Medical History:  Diagnosis Date   Arthritis    Depression    Diabetes mellitus without complication (HCC)    Gout    Hyperlipidemia    Hypertension    Ischemic cardiomyopathy    History reviewed. No pertinent surgical history. 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   Ischemic cardiomyopathy 06/12/2018   Thrombocytosis 04/16/2016   Adjustment reaction with prolonged depressive reaction 01/22/2014   Visual loss, right eye 07/30/2012   Type 2 diabetes mellitus (HCC) 06/16/2011   Coronary artery disease 06/16/2011   Hypertension associated with diabetes (HCC) 06/16/2011   ONSET DATE: 11/16/2023 REFERRING DIAG: P36.487 (ICD-10-CM) - Acute ischemic left MCA stroke (HCC)  THERAPY DIAG:  Difficulty in walking, not elsewhere classified  Unsteadiness on feet  Muscle weakness (generalized)  Other lack of coordination  Rationale for Evaluation and Treatment: Rehabilitation  SUBJECTIVE:                                                                                                                                                                                              SUBJECTIVE STATEMENT:  Pt reports knee pain is still improved. Reports it hurts once in a while. Denies falls.   PERTINENT HISTORY: Pt is a pleasant 83 y/o male referred to PT s/p L MCA stroke. Presented 11/16/2023 to [facility] with right facial droop left gaze preference and dysarthria. CT/MRI showed small acute nonhemorrhagic left MCA distribution infarction involving the left frontal lobe. Pt admitted to rehab 11/19/2023 PT/ST/OT. Pt now presents to PT eval in WC. He reports he primarily uses a 4WW at home, but does not ambulate much during the day (maybe ten  minutes total). He reports difficulty with standing up from chairs. He must use grab bars in his bathroom due to difficulty standing up from toilet. He has difficulty with stairs, requires help getting up step to enter his home. He reports no recent falls, but that he does stumble with his 4WW when fatigued. 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  PAIN:  Are you having pain? No- knee continues to feel much improved.   PRECAUTIONS: Fall  WEIGHT BEARING RESTRICTIONS: No  FALLS: Has patient fallen in last 6 months? No but does report stumbling with fatigue  LIVING ENVIRONMENT: Lives with: lives with their family son and DIL Lives in: House/apartment, two level home but not using upstairs, stays on first floor Stairs: 1 step to enter home, says no handrails but he has help getting up step Has following equipment at home: Walker - 4 wheeled, shower chair, and Grab bars  PLOF: Independent  PATIENT GOALS: everything: walking, balance, strength   OBJECTIVE:  Note: Objective measures were completed at Evaluation unless otherwise noted.  DIAGNOSTIC FINDINGS: MR brain 11/16/23:  IMPRESSION: 1. Small acute nonhemorrhagic left MCA distribution infarct involving the left frontal lobe. No associated mass effect. 2. Additional punctate subcentimeter acute ischemic  nonhemorrhagic infarct within the contralateral right basal ganglia. 3. Underlying moderately advanced chronic microvascular ischemic disease with a few scattered remote lacunar infarcts as above. 4. Chronic occlusion of the left vertebral artery.    Electronically Signed   By: Morene Hoard M.D.   On: 11/16/2023 23:48  CT ANGIO HEAD NECK 11/16/23  IMPRESSION: No large vessel occlusion or evidence of findings amenable to neurovascular intervention.   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   Multiple intracranial vascular stenoses as above. Focal short segment occlusion of an M2 inferior division branch of the right MCA with reconstitution noted.   Mild-to-moderate stenosis of the V4 segment right vertebral artery.   Emphysema (ICD10-J43.9).     Electronically Signed   By: Donnice Mania M.D.   On: 11/16/2023 15:50  CT HEAD 11/16/23: IMPRESSION: 1. 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 2. Subtle hypoattenuation at the anterior limb of the right internal capsule. 3. 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 4. Age indeterminate infarct in the left lentiform nucleus. 5. Moderate atrophy and white matter disease likely reflects the sequela of chronic microvascular ischemia. 6. No acute hemorrhage or mass lesion.   These results were called by telephone at the time of interpretation on 11/16/2023 at 3:13 pm to provider Dr. Matthews, who verbally acknowledged these results.    Electronically Signed   By: Lonni Necessary M.D.   On: 11/16/2023 15:14                                                                                 TREATMENT DATE 03/28/24 :   There.Act:  Ambulated 1 lap on hospital LL. Needs  seated rest  about 3/4 lap through. Reports greater fatigue in LE's from walking at home a lot the day prior. Notable diminished foot clearance last 1/4 of lap back to PT gym.   STS with airex pad in seat: 2x8  Side stepping along mat table no UE support: 2x 4 laps down and back CGA. Notable R trendelenburg in stance phase. Seated rest b/t.   Seated ankle DF with 4# AW's on feet: 2x20   Obstacle course using Bari 4 WW: x2 orange hurdle step over --> airex pad step up and over. X4 laps CGA. X1 post LOB on airex pad but corrects with stepping strategy CGA. VC's to get closer to hurdle before stepping over.     PATIENT EDUCATION: Education details: Pt educated throughout session about proper posture and technique with exercises. Improved exercise technique, movement at target joints, use of target muscles after min to mod verbal, visual, tactile cues.  Person educated: Patient Education method: Explanation, Demonstration, and Verbal cues Education comprehension: verbalized understanding and returned demonstration  HOME EXERCISE PROGRAM: Access Code: [email or web address] URL: [email or web address] Date: 01/30/2024 Prepared by: Massie Dollar  Exercises - Seated March  - 1 x daily - 7 x weekly - 2 sets - 10 reps - Seated Long Arc Quad  - 1 x daily - 7 x weekly - 2 sets - 10 reps - 3 sec hold - Standing Knee Flexion AROM with Chair Support  - 1 x daily - 7 x weekly - 2 sets - 10 reps - Side Stepping with Counter Support  - 1 x daily - 7 x weekly - 3 sets - 10 reps - Sit to Stand with Armchair  - 1 x daily - 7 x weekly - 3 sets - 10 reps - Walking with Counter Support  - 1 x daily - 7 x weekly - 3 sets - 10 reps - Semi-Tandem Balance at [employer] Open  - 1 x daily - 7 x weekly - 2 sets - 4 reps - 15 seconds hold - Standing March with Counter Support  - 1 x daily - 7 x weekly - 3 sets - 10 reps  GOALS: Goals reviewed with patient? Yes  SHORT TERM GOALS: Target date:  01/18/2024 Patient will be independent in home exercise program to improve strength/mobility for better functional independence with ADLs. Baseline: 01/15/2024= Patient able to verbalize and demonstrate his seated HEP without prompting- states compliance.  Goal status: MET  LONG TERM GOALS: Target date: 04/24/2024 1. Patient will increase SIS-16 score by at least 10 points  to demonstrate increased ease with ADLs and quality of life.  Baseline: 54, 7/23: 51 Goal status: ONGOING  2.  Patient (> 48 years old) will complete five times sit to stand test in < 15 seconds indicating an increased LE strength and improved balance. Baseline: 44.2 sec with use of BUE; 01/15/2024= 36.46 sec with BUE Support (Still unable to rise without UE Support and some posterior lean- CGA)  7/14 22.08 sec with UE pushing from arm rest. 39.30 sec with no UE support and min assist to prevent posterior LOB. Goal status: PROGRESSING   3.  Patient will increase Berg Balance score by > 6 points to demonstrate decreased fall risk during functional activities Baseline: 34 7/14: 38 Goal status: ONGOING  4.  Patient will increase 10 meter walk test to >1.74m/s as to improve gait speed for better community ambulation and to reduce fall risk. Baseline: 0.53 m/s with 4WW; 01/15/2024= 0.58 m/s with 5TT 7/14:  0.43m/s  Goal status: PROGRESSING  5.  Patient will reduce timed up and go to <11 seconds to reduce fall risk and demonstrate improved transfer/gait ability. Baseline: 33 sec with 4WW; 01/15/2024= 25.42 sec avg with 4WW 7/14: 20.72 sec avg with 4WW  Goal status: PROGRESSING  6. Patient will increase six minute walk test distance to >1000 for progression to community ambulator and improve gait ability Baseline: 310 feet using a 4WW with CGA and only completes 3:17 sec prior to requesting to sit secondary to fatigue; 01/15/2024= 610 feet in with 4WW 7/14: 640ft with 4WW; 03/21/24: 936ft 8m13sec (achieved a negative split)  Goal  status: PROGRESSING  ASSESSMENT:  CLINICAL IMPRESSION: Continuing PT POC working on LE strengthening/endurance. Pt more fatigued today more than normal needing reduced volume and greater seated rest breaks. Able to maintain side stepping with greater volume without UE support with good balance. Pt does have notable R sided trendelenburg with RLE in stance phase. Conitnuing to address anterior weakness primarily hip flexion and ankle DF to improve foot clearance with gait. Pt will continue to benefit from skilled physical therapy intervention to address impairments, improve QOL, and attain therapy goals.    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   REHAB POTENTIAL: Good  CLINICAL DECISION MAKING: Evolving/moderate complexity  EVALUATION COMPLEXITY: Moderate  PLAN:  PT FREQUENCY: 1-2x/week  PT DURATION: 12 weeks  PLANNED INTERVENTIONS: 97164- PT Re-evaluation, 97750- Physical Performance Testing, 97110-Therapeutic exercises, 97530- Therapeutic activity, [id number]- Neuromuscular re-education, 97535- Self Care, [id number]- Manual therapy, [phone]- Gait training, [phone]- Orthotic Initial, [phone]- Orthotic/Prosthetic subsequent, [phone]- Canalith repositioning, Patient/Family education, Balance training, Stair training, Taping, Joint mobilization, Spinal mobilization, Vestibular training, DME instructions, Wheelchair mobility training, Cryotherapy, and Moist heat  PLAN FOR NEXT SESSION:   Bring attention to anterior compartment weakness and motor control impairment;  needs help with 5xSTS,  Dorina HERO. Fairly IV, PT, DPT Physical Therapist- [place]  [facility] 11:36 AM, 03/28/24

## 2024-03-28 NOTE — Telephone Encounter (Signed)
 See other telephone encounter.

## 2024-04-01 NOTE — Therapy (Signed)
 OUTPATIENT PHYSICAL THERAPY TREATMENT/Physical Therapy Progress Note   Dates of reporting period  02/19/2024   to   04/02/2024    Patient Name: [name] [name] MRN: [id number] DOB:[birth year], 83 y.o., male Today's Date: 04/02/2024  PCP: Rudy Alyce RAMAN, MD REFERRING PROVIDER:   Pegge Toribio PARAS, PA-C   END OF SESSION:  PT End of Session - 04/02/24 1022     Visit Number 30    Number of Visits 39    Date for PT Re-Evaluation 04/24/24    Progress Note Due on Visit 30    PT Start Time 1015    PT Stop Time 1058    PT Time Calculation (min) 43 min    Equipment Utilized During Treatment Gait belt    Activity Tolerance Patient tolerated treatment well;No increased pain    Behavior During Therapy WFL for tasks assessed/performed            Past Medical History:  Diagnosis Date   Arthritis    Depression    Diabetes mellitus without complication (HCC)    Gout    Hyperlipidemia    Hypertension    Ischemic cardiomyopathy    History reviewed. No pertinent surgical history. 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   Ischemic cardiomyopathy 06/12/2018   Thrombocytosis 04/16/2016   Adjustment reaction with prolonged depressive reaction 01/22/2014   Visual loss, right eye 07/30/2012   Type 2 diabetes mellitus (HCC) 06/16/2011   Coronary artery disease 06/16/2011   Hypertension associated with diabetes (HCC) 06/16/2011   ONSET DATE: 11/16/2023 REFERRING DIAG: P36.487 (ICD-10-CM) - Acute ischemic left MCA stroke (HCC)  THERAPY DIAG:  Difficulty in walking, not elsewhere classified  Unsteadiness on feet  Muscle weakness (generalized)  Other lack of coordination  Acute ischemic left MCA stroke (HCC)  Rationale for Evaluation and Treatment: Rehabilitation  SUBJECTIVE:                                                                                                                                                                                              SUBJECTIVE STATEMENT:  Pt reports no pain today and no falls   PERTINENT HISTORY: Pt is a pleasant 83 y/o male referred to PT s/p L MCA stroke. Presented 11/16/2023 to [facility] with right facial droop left gaze preference and dysarthria. CT/MRI showed small acute nonhemorrhagic left MCA distribution infarction involving the left frontal lobe. Pt admitted to rehab 11/19/2023 PT/ST/OT. Pt now presents to PT eval in  [place]. He reports he primarily uses a 4WW at home, but does not ambulate much during the day (maybe ten minutes total). He reports difficulty with standing up from chairs. He must use grab bars in his bathroom due to difficulty standing up from toilet. He has difficulty with stairs, requires help getting up step to enter his home. He reports no recent falls, but that he does stumble with his 4WW when fatigued. 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  PAIN:  Are you having pain? No- knee continues to feel much improved.   PRECAUTIONS: Fall  WEIGHT BEARING RESTRICTIONS: No  FALLS: Has patient fallen in last 6 months? No but does report stumbling with fatigue  LIVING ENVIRONMENT: Lives with: lives with their family son and DIL Lives in: House/apartment, two level home but not using upstairs, stays on first floor Stairs: 1 step to enter home, says no handrails but he has help getting up step Has following equipment at home: Walker - 4 wheeled, shower chair, and Grab bars  PLOF: Independent  PATIENT GOALS: everything: walking, balance, strength   OBJECTIVE:  Note: Objective measures were completed at Evaluation unless otherwise noted.  DIAGNOSTIC FINDINGS: MR brain 11/16/23:  IMPRESSION: 1. Small acute nonhemorrhagic left MCA distribution infarct involving the left frontal  lobe. No associated mass effect. 2. Additional punctate subcentimeter acute ischemic nonhemorrhagic infarct within the contralateral right basal ganglia. 3. Underlying moderately advanced chronic microvascular ischemic disease with a few scattered remote lacunar infarcts as above. 4. Chronic occlusion of the left vertebral artery.    Electronically Signed   By: Morene Hoard M.D.   On: 11/16/2023 23:48  CT ANGIO HEAD NECK 11/16/23  IMPRESSION: No large vessel occlusion or evidence of findings amenable to neurovascular intervention.   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   Multiple intracranial vascular stenoses as above. Focal short segment occlusion of an M2 inferior division branch of the right MCA with reconstitution noted.   Mild-to-moderate stenosis of the V4 segment right vertebral artery.   Emphysema (ICD10-J43.9).     Electronically Signed   By: Donnice Mania M.D.   On: 11/16/2023 15:50  CT HEAD 11/16/23: IMPRESSION: 1. 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 2. Subtle hypoattenuation at the anterior limb of the right internal capsule. 3. 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 4. Age indeterminate infarct in the left lentiform nucleus. 5. Moderate atrophy and white matter disease likely reflects the sequela of chronic microvascular ischemia. 6. No acute hemorrhage or mass lesion.   These results were called by telephone at the time of interpretation on 11/16/2023 at 3:13 pm to provider Dr. Matthews, who verbally acknowledged these results.    Electronically Signed   By: Lonni Necessary M.D.   On: 11/16/2023 15:14  TREATMENT DATE 04/02/24 :    Physical therapy treatment session today consisted of completing assessment of goals and administration of testing as demonstrated and documented in flow sheet, treatment, and goals section of this note. Addition treatments may be found below.    Pt performed 5 time sit<>stand (5xSTS): 19.08 sec with min BUE support from arm rest; 04/02/2024= 32.35 sec without UE Support sec (>15 sec indicates increased fall risk)      10 Meter Walk Test: Patient instructed to walk 10 meters (32.8 ft) as quickly and as safely as possible at their normal speed x2 and at a fast speed x2. Time measured from 2 meter mark to 8 meter mark to accommodate ramp-up and ramp-down.  Normal speed 1: 0.69 m/s Normal speed 2: 0.67 m/s Average Normal speed: 0.68 m/s Cut off scores: <0.4 m/s = household Ambulator, 0.4-0.8 m/s = limited community Ambulator, >0.8 m/s = community Ambulator, >1.2 m/s = crossing a street, <1.0 = increased fall risk MCID 0.05 m/s (small), 0.13 m/s (moderate), 0.06 m/s (significant)  (ANPTA Core Set of Outcome Measures for Adults with Neurologic Conditions, 2018)   6 Min Walk Test:  Instructed patient to ambulate as quickly and as safely as possible for 6 minutes using LRAD. Patient was allowed to take standing rest breaks without stopping the test, but if the patient required a sitting rest break the clock would be stopped and the test would be over.  Results: 790 feet (240.8 meters, Avg speed 0.21m/s) using a 4WW with SBA. Results indicate that the patient has reduced endurance with ambulation compared to age matched norms.  Age Matched Norms: 70-69 yo M: 77 F: 44, 59-79 yo M: 76 F: 471, 1-89 yo M: 417 F: 392 MDC: 58.21 meters (190.98 feet) or 50 meters (ANPTA Core Set of Outcome Measures for Adults with Neurologic Conditions, 2018)     [facility] PT Assessment - 04/02/24 1046       Berg Balance Test   Sit to Stand Able to stand without using hands and stabilize  independently    Standing Unsupported Able to stand safely 2 minutes    Sitting with Back Unsupported but Feet Supported on Floor or Stool Able to sit safely and securely 2 minutes    Stand to Sit Controls descent by using hands    Transfers Able to transfer safely, minor use of hands    Standing Unsupported with Eyes Closed Able to stand 10 seconds safely    Standing Unsupported with Feet Together Able to place feet together independently and stand 1 minute safely    From Standing, Reach Forward with Outstretched Arm Can reach confidently >25 cm (10)    From Standing Position, Pick up Object from Floor Able to pick up shoe safely and easily    From Standing Position, Turn to Look Behind Over each Shoulder Turn sideways only but maintains balance    Turn 360 Degrees Able to turn 360 degrees safely but slowly    Standing Unsupported, Alternately Place Feet on Step/Stool Able to stand independently and safely and complete 8 steps in 20 seconds    Standing Unsupported, One Foot in Front Able to take small step independently and hold 30 seconds    Standing on One Leg Tries to lift leg/unable to hold 3 seconds but remains standing independently    Total Score 46          Self-care/Home management:  Reviewed walking program and current activities listed in HEP   PATIENT  EDUCATION: Education details: Pt educated throughout session about proper posture and technique with exercises. Improved exercise technique, movement at target joints, use of target muscles after min to mod verbal, visual, tactile cues.  Person educated: Patient Education method: Explanation, Demonstration, and Verbal cues Education comprehension: verbalized understanding and returned demonstration  HOME EXERCISE PROGRAM: Access Code: [email or web address] URL: [email or web address] Date: 01/30/2024 Prepared by: Massie Dollar  Exercises - Seated March  - 1 x daily - 7 x weekly - 2 sets - 10 reps - Seated Long Arc  Quad  - 1 x daily - 7 x weekly - 2 sets - 10 reps - 3 sec hold - Standing Knee Flexion AROM with Chair Support  - 1 x daily - 7 x weekly - 2 sets - 10 reps - Side Stepping with Counter Support  - 1 x daily - 7 x weekly - 3 sets - 10 reps - Sit to Stand with Armchair  - 1 x daily - 7 x weekly - 3 sets - 10 reps - Walking with Counter Support  - 1 x daily - 7 x weekly - 3 sets - 10 reps - Semi-Tandem Balance at [employer] Open  - 1 x daily - 7 x weekly - 2 sets - 4 reps - 15 seconds hold - Standing March with Counter Support  - 1 x daily - 7 x weekly - 3 sets - 10 reps  GOALS: Goals reviewed with patient? Yes  SHORT TERM GOALS: Target date: 01/18/2024 Patient will be independent in home exercise program to improve strength/mobility for better functional independence with ADLs. Baseline: 01/15/2024= Patient able to verbalize and demonstrate his seated HEP without prompting- states compliance.  Goal status: MET  LONG TERM GOALS: Target date: 04/24/2024 1. Patient will increase SIS-16 score by at least 10 points  to demonstrate increased ease with ADLs and quality of life.  Baseline: 54, 7/23: 51 Goal status: ONGOING  2.  Patient (> 13 years old) will complete five times sit to stand test in < 15 seconds indicating an increased LE strength and improved balance. Baseline: 44.2 sec with use of BUE; 01/15/2024= 36.46 sec with BUE Support (Still unable to rise without UE Support and some posterior lean- CGA)  7/14 22.08 sec with UE pushing from arm rest. 39.30 sec with no UE support and min assist to prevent posterior LOB. 04/02/2024= 19.08 sec with min BUE support from arm rest; 04/02/2024= 32.35 sec without UE Support Goal status: PROGRESSING   3.  Patient will increase Berg Balance score by > 6 points to demonstrate decreased fall risk during functional activities Baseline: 34 7/14: 38 04/02/2024= 46 Goal status: ONGOING  4.  Patient will increase 10 meter walk test to >1.75m/s as to improve  gait speed for better community ambulation and to reduce fall risk. Baseline: 0.53 m/s with 4WW; 01/15/2024= 0.58 m/s with 4WW 7/14: 0.24m/s; 04/02/2024= 0.68 m/s Goal status: PROGRESSING  5.  Patient will reduce timed up and go to <11 seconds to reduce fall risk and demonstrate improved transfer/gait ability. Baseline: 33 sec with 4WW; 01/15/2024= 25.42 sec avg with 4WW 7/14: 20.72 sec avg with 4WW  Goal status: PROGRESSING  6. Patient will increase six minute walk test distance to >1000 for progression to community ambulator and improve gait ability Baseline: 310 feet using a 4WW with CGA and only completes 3:17 sec prior to requesting to sit secondary to fatigue; 01/15/2024= 610 feet in with 4WW 7/14: 614ft with 5TT; 03/21/24: 921ft  41m13sec (achieved a negative split); 04/02/2024= 790 feet using 4WW Goal status: PROGRESSING  ASSESSMENT:  CLINICAL IMPRESSION: Patient presents with good motivation to complete some reassessment of goals. He performed well overall- improving toward all goals. He demonstrated improved 5 x STS, BERG, 10 mwt and 6 min walk test. He presents with decreased risk of falling yet still considered to be at large risk of falling. Patient's condition has the potential to improve in response to therapy. Maximum improvement is yet to be obtained. The anticipated improvement is attainable and reasonable in a generally predictable time.  Pt will continue to benefit from skilled physical therapy intervention to address impairments, improve QOL, and attain therapy goals.    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   REHAB POTENTIAL: Good  CLINICAL DECISION MAKING: Evolving/moderate complexity  EVALUATION COMPLEXITY: Moderate  PLAN:  PT FREQUENCY: 1-2x/week  PT DURATION: 12 weeks  PLANNED INTERVENTIONS: [id number]- PT Re-evaluation, 97750- Physical Performance Testing, 97110-Therapeutic exercises, 97530- Therapeutic activity, 97112- Neuromuscular re-education, 97535- Self Care, [id number]- Manual therapy, [phone]- Gait training, [phone]- Orthotic Initial, [phone]- Orthotic/Prosthetic subsequent, [phone]- Canalith repositioning, Patient/Family education, Balance training, Stair training, Taping, Joint mobilization, Spinal mobilization, Vestibular training, DME instructions, Wheelchair mobility training, Cryotherapy, and Moist heat  PLAN FOR NEXT SESSION:   Bring attention to anterior compartment weakness and motor control impairment Chyrl London, PT  Physical Therapist- [facility]  [facility] 10:34 PM, 04/02/24

## 2024-04-02 ENCOUNTER — Ambulatory Visit

## 2024-04-02 ENCOUNTER — Encounter: Admitting: Speech Pathology

## 2024-04-02 DIAGNOSIS — R278 Other lack of coordination: Secondary | ICD-10-CM

## 2024-04-02 DIAGNOSIS — R262 Difficulty in walking, not elsewhere classified: Secondary | ICD-10-CM

## 2024-04-02 DIAGNOSIS — R2681 Unsteadiness on feet: Secondary | ICD-10-CM

## 2024-04-02 DIAGNOSIS — M6281 Muscle weakness (generalized): Secondary | ICD-10-CM

## 2024-04-02 DIAGNOSIS — I63512 Cerebral infarction due to unspecified occlusion or stenosis of left middle cerebral artery: Secondary | ICD-10-CM

## 2024-04-04 ENCOUNTER — Encounter: Admitting: Speech Pathology

## 2024-04-04 ENCOUNTER — Other Ambulatory Visit: Payer: Self-pay

## 2024-04-04 ENCOUNTER — Ambulatory Visit: Admitting: Physical Therapy

## 2024-04-04 ENCOUNTER — Other Ambulatory Visit: Payer: Self-pay | Admitting: Physical Medicine and Rehabilitation

## 2024-04-04 DIAGNOSIS — M6281 Muscle weakness (generalized): Secondary | ICD-10-CM

## 2024-04-04 DIAGNOSIS — R2681 Unsteadiness on feet: Secondary | ICD-10-CM

## 2024-04-04 DIAGNOSIS — R262 Difficulty in walking, not elsewhere classified: Secondary | ICD-10-CM

## 2024-04-04 NOTE — Therapy (Signed)
 OUTPATIENT PHYSICAL THERAPY TREATMENT    Patient Name: [name] [name] MRN: [id number] DOB:1941/01/11, 83 y.o., male Today's Date: 04/04/2024  PCP: Rudy Alyce RAMAN, MD REFERRING PROVIDER:   Pegge Toribio PARAS, PA-C   END OF SESSION:  PT End of Session - 04/04/24 1032     Visit Number 31    Number of Visits 39    Date for PT Re-Evaluation 04/24/24    Progress Note Due on Visit 30    PT Start Time 1016    PT Stop Time 1057    PT Time Calculation (min) 41 min    Equipment Utilized During Treatment Gait belt    Activity Tolerance Patient tolerated treatment well;No increased pain    Behavior During Therapy WFL for tasks assessed/performed            Past Medical History:  Diagnosis Date   Arthritis    Depression    Diabetes mellitus without complication (HCC)    Gout    Hyperlipidemia    Hypertension    Ischemic cardiomyopathy    No past surgical history on file. 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   Ischemic cardiomyopathy 06/12/2018   Thrombocytosis 04/16/2016   Adjustment reaction with prolonged depressive reaction 01/22/2014   Visual loss, right eye 07/30/2012   Type 2 diabetes mellitus (HCC) 06/16/2011   Coronary artery disease 06/16/2011   Hypertension associated with diabetes (HCC) 06/16/2011   ONSET DATE: 11/16/2023 REFERRING DIAG: P36.487 (ICD-10-CM) - Acute ischemic left MCA stroke (HCC)  THERAPY DIAG:  No diagnosis found.  Rationale for Evaluation and Treatment: Rehabilitation  SUBJECTIVE:                                                                                                                                                                                             SUBJECTIVE STATEMENT:  Pt reports no pain today and no falls.   PERTINENT HISTORY: Pt is a pleasant 83 y/o male  referred to PT s/p L MCA stroke. Presented 11/16/2023 to [facility] with right facial droop left gaze preference and dysarthria. CT/MRI showed small acute nonhemorrhagic left MCA distribution infarction involving the left frontal lobe. Pt admitted to rehab 11/19/2023 PT/ST/OT. Pt now presents to PT eval in WC. He reports he primarily uses a 4WW at home, but does not ambulate much during the day (maybe ten minutes total). He reports difficulty with standing up from chairs. He must use grab bars in his bathroom due to  difficulty standing up from toilet. He has difficulty with stairs, requires help getting up step to enter his home. He reports no recent falls, but that he does stumble with his 4WW when fatigued. 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  PAIN:  Are you having pain? No- knee continues to feel much improved.   PRECAUTIONS: Fall  WEIGHT BEARING RESTRICTIONS: No  FALLS: Has patient fallen in last 6 months? No but does report stumbling with fatigue  LIVING ENVIRONMENT: Lives with: lives with their family son and DIL Lives in: House/apartment, two level home but not using upstairs, stays on first floor Stairs: 1 step to enter home, says no handrails but he has help getting up step Has following equipment at home: Walker - 4 wheeled, shower chair, and Grab bars  PLOF: Independent  PATIENT GOALS: everything: walking, balance, strength   OBJECTIVE:  Note: Objective measures were completed at Evaluation unless otherwise noted.  DIAGNOSTIC FINDINGS: MR brain 11/16/23:  IMPRESSION: 1. Small acute nonhemorrhagic left MCA distribution infarct involving the left frontal lobe. No associated mass effect. 2. Additional punctate subcentimeter acute ischemic nonhemorrhagic infarct within the contralateral right basal ganglia. 3. Underlying moderately advanced chronic microvascular ischemic disease with a few scattered  remote lacunar infarcts as above. 4. Chronic occlusion of the left vertebral artery.    Electronically Signed   By: Morene Hoard M.D.   On: 11/16/2023 23:48  CT ANGIO HEAD NECK 11/16/23  IMPRESSION: No large vessel occlusion or evidence of findings amenable to neurovascular intervention.   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   Multiple intracranial vascular stenoses as above. Focal short segment occlusion of an M2 inferior division branch of the right MCA with reconstitution noted.   Mild-to-moderate stenosis of the V4 segment right vertebral artery.   Emphysema (ICD10-J43.9).     Electronically Signed   By: Donnice Mania M.D.   On: 11/16/2023 15:50  CT HEAD 11/16/23: IMPRESSION: 1. 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 2. Subtle hypoattenuation at the anterior limb of the right internal capsule. 3. 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 4. Age indeterminate infarct in the left lentiform nucleus. 5. Moderate atrophy and white matter disease likely reflects the sequela of chronic microvascular ischemia. 6. No acute hemorrhage or mass lesion.   These results were called by telephone at the time of interpretation on 11/16/2023 at 3:13 pm to provider Dr. Matthews, who verbally acknowledged these results.    Electronically Signed   By: Lonni Necessary M.D.   On: 11/16/2023 15:14                                                                                 TREATMENT DATE 04/04/24 :    TA- To improve functional movements patterns for everyday tasks   Nustep B UE and LE reciprocal movement training x 6 min at level 3   Gait with 4WW x  40 ft   STS with airex in seat 2 x 10 reps   Step ups on 6 in  step 2 x 10 reps with UE support  Gait training Gait with 3# AW and RW x 450 ft with cues for increased speed   Step over 1/2 bolster training 4 x 5 reps ea LE  with UE support with cues to focus on this being like ambulating with instructions for him to pick up his feet.  Gait with transport chair from clinic to elevators instructed patient to utilize rollator to get to and from physical therapy clinic and completed this activity to ensure patient had endurance and stability to get to and from clinic at end of longer sessions.   PATIENT EDUCATION: Education details: Pt educated throughout session about proper posture and technique with exercises. Improved exercise technique, movement at target joints, use of target muscles after min to mod verbal, visual, tactile cues.  Person educated: Patient Education method: Explanation, Demonstration, and Verbal cues Education comprehension: verbalized understanding and returned demonstration  HOME EXERCISE PROGRAM: Access Code: [email or web address] URL: [email or web address] Date: 01/30/2024 Prepared by: Massie Dollar  Exercises - Seated March  - 1 x daily - 7 x weekly - 2 sets - 10 reps - Seated Long Arc Quad  - 1 x daily - 7 x weekly - 2 sets - 10 reps - 3 sec hold - Standing Knee Flexion AROM with Chair Support  - 1 x daily - 7 x weekly - 2 sets - 10 reps - Side Stepping with Counter Support  - 1 x daily - 7 x weekly - 3 sets - 10 reps - Sit to Stand with Armchair  - 1 x daily - 7 x weekly - 3 sets - 10 reps - Walking with Counter Support  - 1 x daily - 7 x weekly - 3 sets - 10 reps - Semi-Tandem Balance at [employer] Open  - 1 x daily - 7 x weekly - 2 sets - 4 reps - 15 seconds hold - Standing March with Counter Support  - 1 x daily - 7 x weekly - 3 sets - 10 reps  GOALS: Goals reviewed with patient? Yes  SHORT TERM GOALS: Target date: 01/18/2024 Patient will be independent in home exercise program to improve strength/mobility  for better functional independence with ADLs. Baseline: 01/15/2024= Patient able to verbalize and demonstrate his seated HEP without prompting- states compliance.  Goal status: MET  LONG TERM GOALS: Target date: 04/24/2024 1. Patient will increase SIS-16 score by at least 10 points  to demonstrate increased ease with ADLs and quality of life.  Baseline: 54, 7/23: 51 Goal status: ONGOING  2.  Patient (> 95 years old) will complete five times sit to stand test in < 15 seconds indicating an increased LE strength and improved balance. Baseline: 44.2 sec with use of BUE; 01/15/2024= 36.46 sec with BUE Support (Still unable to rise without UE Support and some posterior lean- CGA)  7/14 22.08 sec with UE pushing from arm rest. 39.30 sec with no UE support and min assist to prevent posterior LOB. 04/02/2024= 19.08 sec with min BUE support from arm rest; 04/02/2024= 32.35 sec without UE Support Goal status: PROGRESSING   3.  Patient will increase Berg Balance score by > 6 points to demonstrate decreased fall risk during functional activities Baseline: 34 7/14: 38 04/02/2024= 46 Goal status: ONGOING  4.  Patient will increase 10 meter walk test to >1.69m/s as to improve  gait speed for better community ambulation and to reduce fall risk. Baseline: 0.53 m/s with 4WW; 01/15/2024= 0.58 m/s with 4WW 7/14: 0.59m/s; 04/02/2024= 0.68 m/s Goal status: PROGRESSING  5.  Patient will reduce timed up and go to <11 seconds to reduce fall risk and demonstrate improved transfer/gait ability. Baseline: 33 sec with 4WW; 01/15/2024= 25.42 sec avg with 4WW 7/14: 20.72 sec avg with 4WW  Goal status: PROGRESSING  6. Patient will increase six minute walk test distance to >1000 for progression to community ambulator and improve gait ability Baseline: 310 feet using a 4WW with CGA and only completes 3:17 sec prior to requesting to sit secondary to fatigue; 01/15/2024= 610 feet in with 4WW 7/14: 654ft with 4WW; 03/21/24: 956ft  29m13sec (achieved a negative split); 04/02/2024= 790 feet using 4WW Goal status: PROGRESSING  ASSESSMENT:  CLINICAL IMPRESSION: Patient presents with good motivation for completion of physical therapy activities.  Patient instructed to utilize rollator to access clinic in order to get more steps throughout the day as well as to improve his confidence with community related gait and ambulation.  Patient progressing with lower extremity strengthening still has some soreness and weakness in his lower extremities but shows good progress from last time he was with this physical therapist. Pt will continue to benefit from skilled physical therapy intervention to address impairments, improve QOL, and attain therapy goals.    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   REHAB POTENTIAL: Good  CLINICAL DECISION MAKING: Evolving/moderate complexity  EVALUATION COMPLEXITY: Moderate  PLAN:  PT FREQUENCY: 1-2x/week  PT DURATION: 12 weeks  PLANNED INTERVENTIONS: 97164- PT Re-evaluation, 97750- Physical Performance Testing, 97110-Therapeutic exercises, 97530- Therapeutic activity, 97112- Neuromuscular re-education, 97535- Self Care, [id number]- Manual therapy, [phone]- Gait training, [phone]- Orthotic Initial, [phone]- Orthotic/Prosthetic subsequent, [phone]- Canalith repositioning, Patient/Family education, Balance training, Stair training, Taping, Joint mobilization, Spinal mobilization, Vestibular training, DME instructions, Wheelchair mobility training, Cryotherapy, and  Moist heat  PLAN FOR NEXT SESSION:   Bring attention to anterior compartment weakness and motor control impairment  Note: Portions of this document were prepared using Dragon voice recognition software and although reviewed may contain unintentional dictation errors in syntax, grammar, or spelling.  Lonni KATHEE Gainer PT ,DPT Physical Therapist- [place]  [facility]   10:32 AM, 04/04/24

## 2024-04-05 ENCOUNTER — Other Ambulatory Visit: Payer: Self-pay

## 2024-04-09 ENCOUNTER — Other Ambulatory Visit: Payer: Self-pay | Admitting: Physical Medicine and Rehabilitation

## 2024-04-09 ENCOUNTER — Encounter: Admitting: Speech Pathology

## 2024-04-09 ENCOUNTER — Other Ambulatory Visit: Payer: Self-pay

## 2024-04-09 ENCOUNTER — Ambulatory Visit: Attending: Physician Assistant | Admitting: Physical Therapy

## 2024-04-09 ENCOUNTER — Ambulatory Visit: Admitting: Physical Medicine and Rehabilitation

## 2024-04-09 DIAGNOSIS — R262 Difficulty in walking, not elsewhere classified: Secondary | ICD-10-CM | POA: Diagnosis present

## 2024-04-09 DIAGNOSIS — I63512 Cerebral infarction due to unspecified occlusion or stenosis of left middle cerebral artery: Secondary | ICD-10-CM | POA: Insufficient documentation

## 2024-04-09 DIAGNOSIS — R2681 Unsteadiness on feet: Secondary | ICD-10-CM | POA: Diagnosis present

## 2024-04-09 DIAGNOSIS — M6281 Muscle weakness (generalized): Secondary | ICD-10-CM | POA: Diagnosis present

## 2024-04-09 DIAGNOSIS — R278 Other lack of coordination: Secondary | ICD-10-CM | POA: Diagnosis present

## 2024-04-09 NOTE — Therapy (Signed)
 OUTPATIENT PHYSICAL THERAPY TREATMENT    Patient Name: [name] [name] MRN: [id number] DOB:10/30/1940, 83 y.o., male Today's Date: 04/09/2024  PCP: Rudy Alyce RAMAN, MD REFERRING PROVIDER:   Pegge Toribio PARAS, PA-C   END OF SESSION:  PT End of Session - 04/09/24 1103     Visit Number 32    Number of Visits 39    Date for PT Re-Evaluation 04/24/24    Progress Note Due on Visit 30    PT Start Time 1016    PT Stop Time 1058    PT Time Calculation (min) 42 min    Equipment Utilized During Treatment Gait belt    Activity Tolerance Patient tolerated treatment well;No increased pain    Behavior During Therapy WFL for tasks assessed/performed             Past Medical History:  Diagnosis Date   Arthritis    Depression    Diabetes mellitus without complication (HCC)    Gout    Hyperlipidemia    Hypertension    Ischemic cardiomyopathy    No past surgical history on file. 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   Ischemic cardiomyopathy 06/12/2018   Thrombocytosis 04/16/2016   Adjustment reaction with prolonged depressive reaction 01/22/2014   Visual loss, right eye 07/30/2012   Type 2 diabetes mellitus (HCC) 06/16/2011   Coronary artery disease 06/16/2011   Hypertension associated with diabetes (HCC) 06/16/2011   ONSET DATE: 11/16/2023 REFERRING DIAG: P36.487 (ICD-10-CM) - Acute ischemic left MCA stroke (HCC)  THERAPY DIAG:  Difficulty in walking, not elsewhere classified  Unsteadiness on feet  Rationale for Evaluation and Treatment: Rehabilitation  SUBJECTIVE:                                                                                                                                                                                             SUBJECTIVE STATEMENT:  Pt reports no pain today and no falls. Pt  uses rollator to get to clinic compared to transport chair per PT instructions.   PERTINENT HISTORY: Pt is a pleasant 83 y/o male referred to PT s/p L MCA stroke. Presented 11/16/2023 to [facility] with right facial droop left gaze preference and dysarthria. CT/MRI showed small acute nonhemorrhagic left MCA distribution infarction involving the left frontal lobe. Pt admitted to rehab 11/19/2023 PT/ST/OT. Pt now presents to PT eval in WC. He reports he primarily uses a 4WW at home, but does not ambulate much during the day (  maybe ten minutes total). He reports difficulty with standing up from chairs. He must use grab bars in his bathroom due to difficulty standing up from toilet. He has difficulty with stairs, requires help getting up step to enter his home. He reports no recent falls, but that he does stumble with his 4WW when fatigued. 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  PAIN:  Are you having pain? No- knee continues to feel much improved.   PRECAUTIONS: Fall  WEIGHT BEARING RESTRICTIONS: No  FALLS: Has patient fallen in last 6 months? No but does report stumbling with fatigue  LIVING ENVIRONMENT: Lives with: lives with their family son and DIL Lives in: House/apartment, two level home but not using upstairs, stays on first floor Stairs: 1 step to enter home, says no handrails but he has help getting up step Has following equipment at home: Walker - 4 wheeled, shower chair, and Grab bars  PLOF: Independent  PATIENT GOALS: everything: walking, balance, strength   OBJECTIVE:  Note: Objective measures were completed at Evaluation unless otherwise noted.  DIAGNOSTIC FINDINGS: MR brain 11/16/23:  IMPRESSION: 1. Small acute nonhemorrhagic left MCA distribution infarct involving the left frontal lobe. No associated mass effect. 2. Additional punctate subcentimeter acute ischemic nonhemorrhagic infarct within the  contralateral right basal ganglia. 3. Underlying moderately advanced chronic microvascular ischemic disease with a few scattered remote lacunar infarcts as above. 4. Chronic occlusion of the left vertebral artery.    Electronically Signed   By: Morene Hoard M.D.   On: 11/16/2023 23:48  CT ANGIO HEAD NECK 11/16/23  IMPRESSION: No large vessel occlusion or evidence of findings amenable to neurovascular intervention.   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   Multiple intracranial vascular stenoses as above. Focal short segment occlusion of an M2 inferior division branch of the right MCA with reconstitution noted.   Mild-to-moderate stenosis of the V4 segment right vertebral artery.   Emphysema (ICD10-J43.9).     Electronically Signed   By: Donnice Mania M.D.   On: 11/16/2023 15:50  CT HEAD 11/16/23: IMPRESSION: 1. 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 2. Subtle hypoattenuation at the anterior limb of the right internal capsule. 3. 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 4. Age indeterminate infarct in the left lentiform nucleus. 5. Moderate atrophy and white matter disease likely reflects the sequela of chronic microvascular ischemia. 6. No acute hemorrhage or mass lesion.   These results were called by telephone at the time of interpretation on 11/16/2023 at 3:13 pm to provider Dr. Matthews, who verbally acknowledged these results.    Electronically Signed   By: Lonni Necessary M.D.   On: 11/16/2023 15:14                                                                                 TREATMENT DATE 04/09/24 :    TA- To improve functional movements patterns for everyday tasks  Nustep B UE and LE  reciprocal movement training x 6 min at level 3   Gait with 4WW x 150 ft after adjusting height of personal rollator to more appropriate height  STS with airex in seat 2 x 10 reps   Gait with SPC in L UE x 170 ft, starts with 3 point then intermittently progresses to 2 point gait, cues for proper sequencing at times   Step ups on 6 in step 2 x 10 reps with UE support  Gait with SPC in L UE x 80 ft with weaving between obstacles , starts with 3 point then intermittently progresses to 2 point gait, cues for proper sequencing at times   TE- To improve strength, endurance, mobility, and function of specific targeted muscle groups or improve joint range of motion or improve muscle flexibility  LAQ 2 x 12 ea LE with 5# AW   Seated march 2 x 12 ea LE with 5# AW  Unless otherwise stated, CGA was provided and gait belt donned in order to ensure pt safety   PATIENT EDUCATION: Education details: Pt educated throughout session about proper posture and technique with exercises. Improved exercise technique, movement at target joints, use of target muscles after min to mod verbal, visual, tactile cues.  Person educated: Patient Education method: Explanation, Demonstration, and Verbal cues Education comprehension: verbalized understanding and returned demonstration  HOME EXERCISE PROGRAM: Access Code: [email or web address] URL: [email or web address] Date: 01/30/2024 Prepared by: Massie Dollar  Exercises - Seated March  - 1 x daily - 7 x weekly - 2 sets - 10 reps - Seated Long Arc Quad  - 1 x daily - 7 x weekly - 2 sets - 10 reps - 3 sec hold - Standing Knee Flexion AROM with Chair Support  - 1 x daily - 7 x weekly - 2 sets - 10 reps - Side Stepping with Counter Support  - 1 x daily - 7 x weekly - 3 sets - 10 reps - Sit to Stand with Armchair  - 1 x daily - 7 x weekly - 3 sets - 10 reps - Walking with Counter Support  - 1 x daily - 7 x weekly - 3 sets - 10 reps - Semi-Tandem Balance at  [employer] Open  - 1 x daily - 7 x weekly - 2 sets - 4 reps - 15 seconds hold - Standing March with Counter Support  - 1 x daily - 7 x weekly - 3 sets - 10 reps  GOALS: Goals reviewed with patient? Yes  SHORT TERM GOALS: Target date: 01/18/2024 Patient will be independent in home exercise program to improve strength/mobility for better functional independence with ADLs. Baseline: 01/15/2024= Patient able to verbalize and demonstrate his seated HEP without prompting- states compliance.  Goal status: MET  LONG TERM GOALS: Target date: 04/24/2024 1. Patient will increase SIS-16 score by at least 10 points  to demonstrate increased ease with ADLs and quality of life.  Baseline: 54, 7/23: 51 Goal status: ONGOING  2.  Patient (> 72 years old) will complete five times sit to stand test in < 15 seconds indicating an increased LE strength and improved balance. Baseline: 44.2 sec with use of BUE; 01/15/2024= 36.46 sec with BUE Support (Still unable to rise without UE Support and some posterior lean- CGA)  7/14 22.08 sec with UE pushing from arm rest. 39.30 sec with no UE support and min assist to prevent posterior LOB. 04/02/2024= 19.08 sec with min BUE support from  arm rest; 04/02/2024= 32.35 sec without UE Support Goal status: PROGRESSING   3.  Patient will increase Berg Balance score by > 6 points to demonstrate decreased fall risk during functional activities Baseline: 34 7/14: 38 04/02/2024= 46 Goal status: ONGOING  4.  Patient will increase 10 meter walk test to >1.58m/s as to improve gait speed for better community ambulation and to reduce fall risk. Baseline: 0.53 m/s with 4WW; 01/15/2024= 0.58 m/s with 4WW 7/14: 0.32m/s; 04/02/2024= 0.68 m/s Goal status: PROGRESSING  5.  Patient will reduce timed up and go to <11 seconds to reduce fall risk and demonstrate improved transfer/gait ability. Baseline: 33 sec with 4WW; 01/15/2024= 25.42 sec avg with 4WW 7/14: 20.72 sec avg with 4WW  Goal status:  PROGRESSING  6. Patient will increase six minute walk test distance to >1000 for progression to community ambulator and improve gait ability Baseline: 310 feet using a 4WW with CGA and only completes 3:17 sec prior to requesting to sit secondary to fatigue; 01/15/2024= 610 feet in with 4WW 7/14: 663ft with 4WW; 03/21/24: 945ft 60m13sec (achieved a negative split); 04/02/2024= 790 feet using 4WW Goal status: PROGRESSING  ASSESSMENT:  CLINICAL IMPRESSION:  Patient presents with good motivation for completion of physical therapy activities. Pt brings rollator to clinic and walks in and out with it. Pt instructed in cane use and was able to practice some in clinic. Pt still fatigues easily with large LE movements like step ups and sit to stands.  Pt will continue to benefit from skilled physical therapy intervention to address impairments, improve QOL, and attain therapy goals.    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   REHAB POTENTIAL: Good  CLINICAL DECISION MAKING: Evolving/moderate complexity  EVALUATION COMPLEXITY: Moderate  PLAN:  PT FREQUENCY: 1-2x/week  PT DURATION: 12 weeks  PLANNED INTERVENTIONS: 97164- PT Re-evaluation, 97750- Physical Performance Testing, 97110-Therapeutic exercises, 97530- Therapeutic activity, 97112- Neuromuscular re-education, 97535- Self Care, 02859- Manual therapy, [phone]- Gait training, [phone]- Orthotic Initial, [phone]- Orthotic/Prosthetic subsequent, [phone]- Canalith repositioning, Patient/Family education,  Balance training, Stair training, Taping, Joint mobilization, Spinal mobilization, Vestibular training, DME instructions, Wheelchair mobility training, Cryotherapy, and Moist heat  PLAN FOR NEXT SESSION:   Bring attention to anterior compartment weakness and motor control impairment  Note: Portions of this document were prepared using Dragon voice recognition software and although reviewed may contain unintentional dictation errors in syntax, grammar, or spelling.  Lonni KATHEE Gainer PT ,DPT Physical Therapist- [facility]   11:03 AM, 04/09/24

## 2024-04-10 ENCOUNTER — Other Ambulatory Visit: Payer: Self-pay

## 2024-04-11 ENCOUNTER — Encounter: Admitting: Speech Pathology

## 2024-04-11 ENCOUNTER — Other Ambulatory Visit: Payer: Self-pay

## 2024-04-11 ENCOUNTER — Ambulatory Visit: Admitting: Physical Therapy

## 2024-04-11 NOTE — Therapy (Deleted)
 OUTPATIENT PHYSICAL THERAPY TREATMENT    Patient Name: [name] [name] MRN: [id number] DOB:[birth year], 83 y.o., male Today's Date: 04/11/2024  PCP: Rudy Alyce RAMAN, MD REFERRING PROVIDER:   Pegge Toribio PARAS, PA-C   END OF SESSION:       Past Medical History:  Diagnosis Date   Arthritis    Depression    Diabetes mellitus without complication (HCC)    Gout    Hyperlipidemia    Hypertension    Ischemic cardiomyopathy    No past surgical history on file. 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   Ischemic cardiomyopathy 06/12/2018   Thrombocytosis 04/16/2016   Adjustment reaction with prolonged depressive reaction 01/22/2014   Visual loss, right eye 07/30/2012   Type 2 diabetes mellitus (HCC) 06/16/2011   Coronary artery disease 06/16/2011   Hypertension associated with diabetes (HCC) 06/16/2011   ONSET DATE: 11/16/2023 REFERRING DIAG: P36.487 (ICD-10-CM) - Acute ischemic left MCA stroke (HCC)  THERAPY DIAG:  Difficulty in walking, not elsewhere classified  Unsteadiness on feet  Muscle weakness (generalized)  Rationale for Evaluation and Treatment: Rehabilitation  SUBJECTIVE:                                                                                                                                                                                             SUBJECTIVE STATEMENT:  Pt reports no pain today and no falls. Pt uses rollator to get to clinic compared to transport chair per PT instructions.   PERTINENT HISTORY: Pt is a pleasant 83 y/o male referred to PT s/p L MCA stroke. Presented 11/16/2023 to [facility] with right facial droop left gaze preference and dysarthria. CT/MRI showed small acute nonhemorrhagic left MCA distribution infarction involving the left frontal lobe. Pt admitted to rehab 11/19/2023  PT/ST/OT. Pt now presents to PT eval in WC. He reports he primarily uses a 4WW at home, but does not ambulate much during the day (maybe ten minutes total). He reports difficulty with standing up from chairs. He must use grab bars in his bathroom due to difficulty standing up from toilet. He has difficulty with stairs, requires help getting up step to enter his home. He reports no recent falls, but that he does stumble with his 4WW when fatigued. 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  PAIN:  Are you having pain? No- knee continues to feel much improved.   PRECAUTIONS: Fall  WEIGHT BEARING RESTRICTIONS:  No  FALLS: Has patient fallen in last 6 months? No but does report stumbling with fatigue  LIVING ENVIRONMENT: Lives with: lives with their family son and DIL Lives in: House/apartment, two level home but not using upstairs, stays on first floor Stairs: 1 step to enter home, says no handrails but he has help getting up step Has following equipment at home: Walker - 4 wheeled, shower chair, and Grab bars  PLOF: Independent  PATIENT GOALS: everything: walking, balance, strength   OBJECTIVE:  Note: Objective measures were completed at Evaluation unless otherwise noted.  DIAGNOSTIC FINDINGS: MR brain 11/16/23:  IMPRESSION: 1. Small acute nonhemorrhagic left MCA distribution infarct involving the left frontal lobe. No associated mass effect. 2. Additional punctate subcentimeter acute ischemic nonhemorrhagic infarct within the contralateral right basal ganglia. 3. Underlying moderately advanced chronic microvascular ischemic disease with a few scattered remote lacunar infarcts as above. 4. Chronic occlusion of the left vertebral artery.    Electronically Signed   By: Morene Hoard M.D.   On: 11/16/2023 23:48  CT ANGIO HEAD NECK 11/16/23  IMPRESSION: No large vessel occlusion or evidence of  findings amenable to neurovascular intervention.   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   Multiple intracranial vascular stenoses as above. Focal short segment occlusion of an M2 inferior division branch of the right MCA with reconstitution noted.   Mild-to-moderate stenosis of the V4 segment right vertebral artery.   Emphysema (ICD10-J43.9).     Electronically Signed   By: Donnice Mania M.D.   On: 11/16/2023 15:50  CT HEAD 11/16/23: IMPRESSION: 1. 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 2. Subtle hypoattenuation at the anterior limb of the right internal capsule. 3. 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 4. Age indeterminate infarct in the left lentiform nucleus. 5. Moderate atrophy and white matter disease likely reflects the sequela of chronic microvascular ischemia. 6. No acute hemorrhage or mass lesion.   These results were called by telephone at the time of interpretation on 11/16/2023 at 3:13 pm to provider Dr. Matthews, who verbally acknowledged these results.    Electronically Signed   By: Lonni Necessary M.D.   On: 11/16/2023 15:14                                                                                 TREATMENT DATE 04/11/24 :    TA- To improve functional movements patterns for everyday tasks   Nustep B UE and LE reciprocal movement training x 6 min at level 3   Gait with 4WW x 150 ft after adjusting height of personal rollator to more appropriate height  STS with airex in seat 2 x 10 reps   Gait with SPC in L UE x 170 ft, starts with 3 point then intermittently progresses to 2 point gait, cues for proper sequencing at times   Step ups on 6 in step 2 x 10 reps with UE support  Gait with  SPC in L  UE x 80 ft with weaving between obstacles , starts with 3 point then intermittently progresses to 2 point gait, cues for proper sequencing at times   TE- To improve strength, endurance, mobility, and function of specific targeted muscle groups or improve joint range of motion or improve muscle flexibility  LAQ 2 x 12 ea LE with 5# AW   Seated march 2 x 12 ea LE with 5# AW  Unless otherwise stated, CGA was provided and gait belt donned in order to ensure pt safety   PATIENT EDUCATION: Education details: Pt educated throughout session about proper posture and technique with exercises. Improved exercise technique, movement at target joints, use of target muscles after min to mod verbal, visual, tactile cues.  Person educated: Patient Education method: Explanation, Demonstration, and Verbal cues Education comprehension: verbalized understanding and returned demonstration  HOME EXERCISE PROGRAM: Access Code: [email or web address] URL: [email or web address] Date: 01/30/2024 Prepared by: Massie Dollar  Exercises - Seated March  - 1 x daily - 7 x weekly - 2 sets - 10 reps - Seated Long Arc Quad  - 1 x daily - 7 x weekly - 2 sets - 10 reps - 3 sec hold - Standing Knee Flexion AROM with Chair Support  - 1 x daily - 7 x weekly - 2 sets - 10 reps - Side Stepping with Counter Support  - 1 x daily - 7 x weekly - 3 sets - 10 reps - Sit to Stand with Armchair  - 1 x daily - 7 x weekly - 3 sets - 10 reps - Walking with Counter Support  - 1 x daily - 7 x weekly - 3 sets - 10 reps - Semi-Tandem Balance at [employer] Open  - 1 x daily - 7 x weekly - 2 sets - 4 reps - 15 seconds hold - Standing March with Counter Support  - 1 x daily - 7 x weekly - 3 sets - 10 reps  GOALS: Goals reviewed with patient? Yes  SHORT TERM GOALS: Target date: 01/18/2024 Patient will be independent in home exercise program to improve strength/mobility for better functional independence with ADLs. Baseline:  01/15/2024= Patient able to verbalize and demonstrate his seated HEP without prompting- states compliance.  Goal status: MET  LONG TERM GOALS: Target date: 04/24/2024 1. Patient will increase SIS-16 score by at least 10 points  to demonstrate increased ease with ADLs and quality of life.  Baseline: 54, 7/23: 51 Goal status: ONGOING  2.  Patient (> 59 years old) will complete five times sit to stand test in < 15 seconds indicating an increased LE strength and improved balance. Baseline: 44.2 sec with use of BUE; 01/15/2024= 36.46 sec with BUE Support (Still unable to rise without UE Support and some posterior lean- CGA)  7/14 22.08 sec with UE pushing from arm rest. 39.30 sec with no UE support and min assist to prevent posterior LOB. 04/02/2024= 19.08 sec with min BUE support from arm rest; 04/02/2024= 32.35 sec without UE Support Goal status: PROGRESSING   3.  Patient will increase Berg Balance score by > 6 points to demonstrate decreased fall risk during functional activities Baseline: 34 7/14: 38 04/02/2024= 46 Goal status: ONGOING  4.  Patient will increase 10 meter walk test to >1.51m/s as to improve gait speed for better community ambulation and to reduce fall risk. Baseline: 0.53 m/s with 4WW; 01/15/2024= 0.58 m/s with 4WW 7/14: 0.53m/s; 04/02/2024= 0.68 m/s Goal status: PROGRESSING  5.  Patient will reduce  timed up and go to <11 seconds to reduce fall risk and demonstrate improved transfer/gait ability. Baseline: 33 sec with 4WW; 01/15/2024= 25.42 sec avg with 4WW 7/14: 20.72 sec avg with 4WW  Goal status: PROGRESSING  6. Patient will increase six minute walk test distance to >1000 for progression to community ambulator and improve gait ability Baseline: 310 feet using a 4WW with CGA and only completes 3:17 sec prior to requesting to sit secondary to fatigue; 01/15/2024= 610 feet in with 4WW 7/14: 614ft with 4WW; 03/21/24: 965ft 63m13sec (achieved a negative split); 04/02/2024= 790 feet  using 4WW Goal status: PROGRESSING  ASSESSMENT:  CLINICAL IMPRESSION:  Patient presents with good motivation for completion of physical therapy activities. Pt brings rollator to clinic and walks in and out with it. Pt instructed in cane use and was able to practice some in clinic. Pt still fatigues easily with large LE movements like step ups and sit to stands.  Pt will continue to benefit from skilled physical therapy intervention to address impairments, improve QOL, and attain therapy goals.    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   REHAB POTENTIAL: Good  CLINICAL DECISION MAKING: Evolving/moderate complexity  EVALUATION COMPLEXITY: Moderate  PLAN:  PT FREQUENCY: 1-2x/week  PT DURATION: 12 weeks  PLANNED INTERVENTIONS: 97164- PT Re-evaluation, 97750- Physical Performance Testing, 97110-Therapeutic exercises, 97530- Therapeutic activity, 97112- Neuromuscular re-education, 97535- Self Care, 02859- Manual therapy, [phone]- Gait training, [phone]- Orthotic Initial, [phone]- Orthotic/Prosthetic subsequent, [phone]- Canalith repositioning, Patient/Family education, Balance training, Stair training, Taping, Joint mobilization, Spinal mobilization, Vestibular training, DME instructions, Wheelchair mobility training, Cryotherapy, and Moist heat  PLAN FOR NEXT SESSION:   Bring attention to anterior compartment weakness and motor control impairment  Note: Portions of this document were prepared using Dragon voice recognition software and although reviewed  may contain unintentional dictation errors in syntax, grammar, or spelling.  Lonni KATHEE Gainer PT ,DPT Physical Therapist- [place]  [facility]   8:23 AM, 04/11/24

## 2024-04-16 ENCOUNTER — Encounter: Admitting: Speech Pathology

## 2024-04-16 ENCOUNTER — Ambulatory Visit: Admitting: Physical Therapy

## 2024-04-16 ENCOUNTER — Other Ambulatory Visit: Payer: Self-pay

## 2024-04-16 DIAGNOSIS — R262 Difficulty in walking, not elsewhere classified: Secondary | ICD-10-CM | POA: Diagnosis not present

## 2024-04-16 DIAGNOSIS — M6281 Muscle weakness (generalized): Secondary | ICD-10-CM

## 2024-04-16 DIAGNOSIS — R2681 Unsteadiness on feet: Secondary | ICD-10-CM

## 2024-04-16 NOTE — Therapy (Signed)
 OUTPATIENT PHYSICAL THERAPY TREATMENT    Patient Name: [name] [name] MRN: [id number] DOB:[birth year], 83 y.o., male Today's Date: 04/16/2024  PCP: Rudy Alyce RAMAN, MD REFERRING PROVIDER:   Pegge Toribio PARAS, PA-C   END OF SESSION:  PT End of Session - 04/16/24 1204     Visit Number 33    Number of Visits 39    Date for PT Re-Evaluation 04/24/24    Progress Note Due on Visit 40    PT Start Time 1016    PT Stop Time 1057    PT Time Calculation (min) 41 min    Equipment Utilized During Treatment Gait belt    Activity Tolerance Patient tolerated treatment well;No increased pain    Behavior During Therapy WFL for tasks assessed/performed              Past Medical History:  Diagnosis Date   Arthritis    Depression    Diabetes mellitus without complication (HCC)    Gout    Hyperlipidemia    Hypertension    Ischemic cardiomyopathy    No past surgical history on file. 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   Ischemic cardiomyopathy 06/12/2018   Thrombocytosis 04/16/2016   Adjustment reaction with prolonged depressive reaction 01/22/2014   Visual loss, right eye 07/30/2012   Type 2 diabetes mellitus (HCC) 06/16/2011   Coronary artery disease 06/16/2011   Hypertension associated with diabetes (HCC) 06/16/2011   ONSET DATE: 11/16/2023 REFERRING DIAG: P36.487 (ICD-10-CM) - Acute ischemic left MCA stroke (HCC)  THERAPY DIAG:  Difficulty in walking, not elsewhere classified  Unsteadiness on feet  Muscle weakness (generalized)  Rationale for Evaluation and Treatment: Rehabilitation  SUBJECTIVE:                                                                                                                                                                                             SUBJECTIVE STATEMENT:  Pt  reports no pain today and no falls. Pt uses rollator to get to clinic compared to transport chair per PT instructions. Pt reports he practiced with cane some at home with no issues.   PERTINENT HISTORY: Pt is a pleasant 83 y/o male referred to PT s/p L MCA stroke. Presented 11/16/2023 to [facility] with right facial droop left gaze preference and dysarthria. CT/MRI showed small acute nonhemorrhagic left MCA distribution infarction involving the left frontal lobe. Pt admitted to rehab 11/19/2023 PT/ST/OT. Pt now presents to PT eval in [place].  He reports he primarily uses a 4WW at home, but does not ambulate much during the day (maybe ten minutes total). He reports difficulty with standing up from chairs. He must use grab bars in his bathroom due to difficulty standing up from toilet. He has difficulty with stairs, requires help getting up step to enter his home. He reports no recent falls, but that he does stumble with his 4WW when fatigued. 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  PAIN:  Are you having pain? No- knee continues to feel much improved.   PRECAUTIONS: Fall  WEIGHT BEARING RESTRICTIONS: No  FALLS: Has patient fallen in last 6 months? No but does report stumbling with fatigue  LIVING ENVIRONMENT: Lives with: lives with their family son and DIL Lives in: House/apartment, two level home but not using upstairs, stays on first floor Stairs: 1 step to enter home, says no handrails but he has help getting up step Has following equipment at home: Walker - 4 wheeled, shower chair, and Grab bars  PLOF: Independent  PATIENT GOALS: everything: walking, balance, strength   OBJECTIVE:  Note: Objective measures were completed at Evaluation unless otherwise noted.  DIAGNOSTIC FINDINGS: MR brain 11/16/23:  IMPRESSION: 1. Small acute nonhemorrhagic left MCA distribution infarct involving the left frontal lobe. No associated  mass effect. 2. Additional punctate subcentimeter acute ischemic nonhemorrhagic infarct within the contralateral right basal ganglia. 3. Underlying moderately advanced chronic microvascular ischemic disease with a few scattered remote lacunar infarcts as above. 4. Chronic occlusion of the left vertebral artery.    Electronically Signed   By: Morene Hoard M.D.   On: 11/16/2023 23:48  CT ANGIO HEAD NECK 11/16/23  IMPRESSION: No large vessel occlusion or evidence of findings amenable to neurovascular intervention.   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   Multiple intracranial vascular stenoses as above. Focal short segment occlusion of an M2 inferior division branch of the right MCA with reconstitution noted.   Mild-to-moderate stenosis of the V4 segment right vertebral artery.   Emphysema (ICD10-J43.9).     Electronically Signed   By: Donnice Mania M.D.   On: 11/16/2023 15:50  CT HEAD 11/16/23: IMPRESSION: 1. 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 2. Subtle hypoattenuation at the anterior limb of the right internal capsule. 3. 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 4. Age indeterminate infarct in the left lentiform nucleus. 5. Moderate atrophy and white matter disease likely reflects the sequela of chronic microvascular ischemia. 6. No acute hemorrhage or mass lesion.   These results were called by telephone at the time of interpretation on 11/16/2023 at 3:13 pm to provider Dr. Matthews, who verbally acknowledged these results.    Electronically Signed   By: Lonni Necessary M.D.   On: 11/16/2023 15:14  TREATMENT DATE  04/16/24 :    TA- To improve functional movements patterns for everyday tasks / TE- To improve strength, endurance, mobility, and function of specific targeted muscle groups or improve joint range of motion or improve muscle flexibility (for seated exercises listed below)  STS with airex in seat 2 x 12 reps  -BP after round 1 155/82   Nustep B UE and LE reciprocal movement training x 6 min at level 3   Gait with SPC in L UE x 170 ft, mostly 3 point pattern  Seated march with 3# AW and erect posture while sitting on airex pad 2 x 10 ea  Gait with SPC around 4 large obstacles x 120 ft x 2 rounds with rest break   March with step over cane seated 2 x 15 ea LE with 3# AW   Step up and over obstacle x 3 rounds with cane and cues for sequencing, cane in L and a little instability at times   Step ups on 6 in step  x 10 reps with UE support  Unless otherwise stated, CGA was provided and gait belt donned in order to ensure pt safety   PATIENT EDUCATION: Education details: Pt educated throughout session about proper posture and technique with exercises. Improved exercise technique, movement at target joints, use of target muscles after min to mod verbal, visual, tactile cues.  Person educated: Patient Education method: Explanation, Demonstration, and Verbal cues Education comprehension: verbalized understanding and returned demonstration  HOME EXERCISE PROGRAM: Access Code: [email or web address] URL: [email or web address] Date: 01/30/2024 Prepared by: Massie Dollar  Exercises - Seated March  - 1 x daily - 7 x weekly - 2 sets - 10 reps - Seated Long Arc Quad  - 1 x daily - 7 x weekly - 2 sets - 10 reps - 3 sec hold - Standing Knee Flexion AROM with Chair Support  - 1 x daily - 7 x weekly - 2 sets - 10 reps - Side Stepping with Counter Support  - 1 x daily - 7 x weekly - 3 sets - 10 reps - Sit to Stand with Armchair  - 1 x daily - 7 x weekly - 3 sets - 10 reps - Walking with Counter  Support  - 1 x daily - 7 x weekly - 3 sets - 10 reps - Semi-Tandem Balance at [employer] Open  - 1 x daily - 7 x weekly - 2 sets - 4 reps - 15 seconds hold - Standing March with Counter Support  - 1 x daily - 7 x weekly - 3 sets - 10 reps  GOALS: Goals reviewed with patient? Yes  SHORT TERM GOALS: Target date: 01/18/2024 Patient will be independent in home exercise program to improve strength/mobility for better functional independence with ADLs. Baseline: 01/15/2024= Patient able to verbalize and demonstrate his seated HEP without prompting- states compliance.  Goal status: MET  LONG TERM GOALS: Target date: 04/24/2024 1. Patient will increase SIS-16 score by at least 10 points  to demonstrate increased ease with ADLs and quality of life.  Baseline: 54, 7/23: 51 Goal status: ONGOING  2.  Patient (> 61 years old) will complete five times sit to stand test in < 15 seconds indicating an increased LE strength and improved balance. Baseline: 44.2 sec with use of BUE; 01/15/2024= 36.46 sec with BUE Support (Still unable to rise without UE Support and some posterior lean- CGA)  7/14 22.08 sec with UE pushing from arm rest. 39.30 sec  with no UE support and min assist to prevent posterior LOB. 04/02/2024= 19.08 sec with min BUE support from arm rest; 04/02/2024= 32.35 sec without UE Support Goal status: PROGRESSING   3.  Patient will increase Berg Balance score by > 6 points to demonstrate decreased fall risk during functional activities Baseline: 34 7/14: 38 04/02/2024= 46 Goal status: ONGOING  4.  Patient will increase 10 meter walk test to >1.68m/s as to improve gait speed for better community ambulation and to reduce fall risk. Baseline: 0.53 m/s with 4WW; 01/15/2024= 0.58 m/s with 4WW 7/14: 0.24m/s; 04/02/2024= 0.68 m/s Goal status: PROGRESSING  5.  Patient will reduce timed up and go to <11 seconds to reduce fall risk and demonstrate improved transfer/gait ability. Baseline: 33 sec with 4WW;  01/15/2024= 25.42 sec avg with 4WW 7/14: 20.72 sec avg with 4WW  Goal status: PROGRESSING  6. Patient will increase six minute walk test distance to >1000 for progression to community ambulator and improve gait ability Baseline: 310 feet using a 4WW with CGA and only completes 3:17 sec prior to requesting to sit secondary to fatigue; 01/15/2024= 610 feet in with 4WW 7/14: 654ft with 4WW; 03/21/24: 953ft 69m13sec (achieved a negative split); 04/02/2024= 790 feet using 4WW Goal status: PROGRESSING  ASSESSMENT:  CLINICAL IMPRESSION:  Patient presents with good motivation for completion of physical therapy activities. Pt continues to practice with cane in clinic and showing improved sequencing with gait with SPC. Pt still fatigues easily with large LE movements like step ups and sit to stands but did complete increased repetitions with STS this date without UE assist but still form elevated surface.  Pt will continue to benefit from skilled physical therapy intervention to address impairments, improve QOL, and attain therapy goals.   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   REHAB POTENTIAL: Good  CLINICAL DECISION MAKING: Evolving/moderate complexity  EVALUATION COMPLEXITY: Moderate  PLAN:  PT FREQUENCY: 1-2x/week  PT DURATION: 12 weeks  PLANNED INTERVENTIONS: 97164- PT Re-evaluation, 97750- Physical Performance Testing, 97110-Therapeutic exercises, 97530- Therapeutic activity, 97112- Neuromuscular re-education, 97535- Self Care, [id number]- Manual  therapy, [phone]- Gait training, [phone]- Orthotic Initial, [phone]- Orthotic/Prosthetic subsequent, [phone]- Canalith repositioning, Patient/Family education, Balance training, Stair training, Taping, Joint mobilization, Spinal mobilization, Vestibular training, DME instructions, Wheelchair mobility training, Cryotherapy, and Moist heat  PLAN FOR NEXT SESSION:   Bring attention to anterior compartment weakness and motor control impairment  Note: Portions of this document were prepared using Dragon voice recognition software and although reviewed may contain unintentional dictation errors in syntax, grammar, or spelling.  Lonni KATHEE Gainer PT ,DPT Physical Therapist- [place]  [facility]   12:12 PM, 04/16/24

## 2024-04-17 ENCOUNTER — Other Ambulatory Visit: Payer: Self-pay

## 2024-04-18 ENCOUNTER — Encounter: Admitting: Speech Pathology

## 2024-04-18 ENCOUNTER — Ambulatory Visit: Admitting: Physical Therapy

## 2024-04-18 ENCOUNTER — Other Ambulatory Visit: Payer: Self-pay

## 2024-04-18 DIAGNOSIS — R262 Difficulty in walking, not elsewhere classified: Secondary | ICD-10-CM | POA: Diagnosis not present

## 2024-04-18 DIAGNOSIS — R2681 Unsteadiness on feet: Secondary | ICD-10-CM

## 2024-04-18 DIAGNOSIS — M6281 Muscle weakness (generalized): Secondary | ICD-10-CM

## 2024-04-18 NOTE — Therapy (Signed)
 OUTPATIENT PHYSICAL THERAPY TREATMENT    Patient Name: [name] [name] MRN: [id number] DOB:1940-10-31, 83 y.o., male Today's Date: 04/18/2024  PCP: Rudy Alyce RAMAN, MD REFERRING PROVIDER:   Pegge Toribio PARAS, PA-C   END OF SESSION:  PT End of Session - 04/18/24 1022     Visit Number 34    Number of Visits 39    Date for PT Re-Evaluation 04/24/24    Progress Note Due on Visit 40    PT Start Time 1017    PT Stop Time 1057    PT Time Calculation (min) 40 min    Equipment Utilized During Treatment Gait belt    Activity Tolerance Patient tolerated treatment well;No increased pain    Behavior During Therapy WFL for tasks assessed/performed               Past Medical History:  Diagnosis Date   Arthritis    Depression    Diabetes mellitus without complication (HCC)    Gout    Hyperlipidemia    Hypertension    Ischemic cardiomyopathy    No past surgical history on file. 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   Ischemic cardiomyopathy 06/12/2018   Thrombocytosis 04/16/2016   Adjustment reaction with prolonged depressive reaction 01/22/2014   Visual loss, right eye 07/30/2012   Type 2 diabetes mellitus (HCC) 06/16/2011   Coronary artery disease 06/16/2011   Hypertension associated with diabetes (HCC) 06/16/2011   ONSET DATE: 11/16/2023 REFERRING DIAG: P36.487 (ICD-10-CM) - Acute ischemic left MCA stroke (HCC)  THERAPY DIAG:  No diagnosis found.  Rationale for Evaluation and Treatment: Rehabilitation  SUBJECTIVE:                                                                                                                                                                                             SUBJECTIVE STATEMENT:   Pt reports no pain today and no falls. Pt uses rollator to get to clinic compared to  transport chair per PT instructions. Pt having increased difficulty ambulating this date.   PERTINENT HISTORY: Pt is a pleasant 83 y/o male referred to PT s/p L MCA stroke. [facility]/16/[facility] to [facility] with right facial droop left gaze preference and dysarthria. CT/MRI showed small acute nonhemorrhagic left MCA distribution infarction involving the left frontal lobe. Pt admitted to rehab 11/19/2023 PT/ST/OT. Pt now presents to PT eval in WC. He reports he primarily uses a 4WW at home, but does not ambulate much  during the day (maybe ten minutes total). He reports difficulty with standing up from chairs. He must use grab bars in his bathroom due to difficulty standing up from toilet. He has difficulty with stairs, requires help getting up step to enter his home. He reports no recent falls, but that he does stumble with his 4WW when fatigued. 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  PAIN:  Are you having pain? No- knee continues to feel much improved.   PRECAUTIONS: Fall  WEIGHT BEARING RESTRICTIONS: No  FALLS: Has patient fallen in last 6 months? No but does report stumbling with fatigue  LIVING ENVIRONMENT: Lives with: lives with their family son and DIL Lives in: House/apartment, two level home but not using upstairs, stays on first floor Stairs: 1 step to enter home, says no handrails but he has help getting up step Has following equipment at home: Walker - 4 wheeled, shower chair, and Grab bars  PLOF: Independent  PATIENT GOALS: everything: walking, balance, strength   OBJECTIVE:  Note: Objective measures were completed at Evaluation unless otherwise noted.  DIAGNOSTIC FINDINGS: MR brain 11/16/23:  IMPRESSION: 1. Small acute nonhemorrhagic left MCA distribution infarct involving the left frontal lobe. No associated mass effect. 2. Additional punctate subcentimeter acute ischemic nonhemorrhagic infarct  within the contralateral right basal ganglia. 3. Underlying moderately advanced chronic microvascular ischemic disease with a few scattered remote lacunar infarcts as above. 4. Chronic occlusion of the left vertebral artery.    Electronically Signed   By: Morene Hoard M.D.   On: 11/16/2023 23:48  CT ANGIO HEAD NECK 11/16/23  IMPRESSION: No large vessel occlusion or evidence of findings amenable to neurovascular intervention.   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   Multiple intracranial vascular stenoses as above. Focal short segment occlusion of an M2 inferior division branch of the right MCA with reconstitution noted.   Mild-to-moderate stenosis of the V4 segment right vertebral artery.   Emphysema (ICD10-J43.9).     Electronically Signed   By: Donnice Mania M.D.   On: 11/16/2023 15:50  CT HEAD 11/16/23: IMPRESSION: 1. 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 2. Subtle hypoattenuation at the anterior limb of the right internal capsule. 3. 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 4. Age indeterminate infarct in the left lentiform nucleus. 5. Moderate atrophy and white matter disease likely reflects the sequela of chronic microvascular ischemia. 6. No acute hemorrhage or mass lesion.   These results were called by telephone at the time of interpretation on 11/16/2023 at 3:13 pm to provider Dr. Matthews, who verbally acknowledged these results.    Electronically Signed   By: Lonni Necessary M.D.   On: 11/16/2023 15:14                                                                                 TREATMENT DATE 04/18/24 :    TA- To improve functional movements patterns  for everyday tasks / TE- To  improve strength, endurance, mobility, and function of specific targeted muscle groups or improve joint range of motion or improve muscle flexibility (for seated exercises listed below)  BP: 161/90  mmHg with HR 65 after walking in to clinic  After 5 min seated : BP: 156/84  mmHg with HR 65  STS from plinth table x 5 reps at 21 in ( floor to hard surface of table)  3 x 5 at 20 in   Seated march 2 x 10 reps ea LE  Gait with 4# AW and walker x 170 ft - cues for foot clearance   Seated LAQ with 4#AW 2 x 10 Gait with SPC in L UE x 170 ft, mostly 3 point pattern  Seated march with 3# AW and erect posture  2 x 10 ea  Gait with SPC  x 30 ft, pt very unsteady this date showing hip weakness on R side.  Gait to // bars with walker  X 3 laps with L UE assist and retro walk X 5 laps with no UE forward and UE retro gait   -instability noted in R hip (trendelenburg)  Unless otherwise stated, CGA was provided and gait belt donned in order to ensure pt safety   PATIENT EDUCATION: Education details: Pt educated throughout session about proper posture and technique with exercises. Improved exercise technique, movement at target joints, use of target muscles after min to mod verbal, visual, tactile cues.  Person educated: Patient Education method: Explanation, Demonstration, and Verbal cues Education comprehension: verbalized understanding and returned demonstration  HOME EXERCISE PROGRAM: Access Code: [email or web address] URL: [email or web address] Date: 01/30/2024 Prepared by: Massie Dollar  Exercises - Seated March  - 1 x daily - 7 x weekly - 2 sets - 10 reps - Seated Long Arc Quad  - 1 x daily - 7 x weekly - 2 sets - 10 reps - 3 sec hold - Standing Knee Flexion AROM with Chair Support  - 1 x daily - 7 x weekly - 2 sets - 10 reps - Side Stepping with Counter Support  - 1 x daily - 7 x weekly - 3 sets - 10 reps - Sit to Stand with Armchair  - 1 x daily - 7 x weekly - 3 sets - 10 reps -  Walking with Counter Support  - 1 x daily - 7 x weekly - 3 sets - 10 reps - Semi-Tandem Balance at [employer] Open  - 1 x daily - 7 x weekly - 2 sets - 4 reps - 15 seconds hold - Standing March with Counter Support  - 1 x daily - 7 x weekly - 3 sets - 10 reps  GOALS: Goals reviewed with patient? Yes  SHORT TERM GOALS: Target date: 01/18/2024 Patient will be independent in home exercise program to improve strength/mobility for better functional independence with ADLs. Baseline: 01/15/2024= Patient able to verbalize and demonstrate his seated HEP without prompting- states compliance.  Goal status: MET  LONG TERM GOALS: Target date: 04/24/2024 1. Patient will increase SIS-16 score by at least 10 points  to demonstrate increased ease with ADLs and quality of life.  Baseline: 54, 7/23: 51 Goal status: ONGOING  2.  Patient (> 30 years old) will complete five times sit to stand test in < 15 seconds indicating an increased LE strength and improved balance. Baseline: 44.2 sec with use of BUE; 01/15/2024= 36.46 sec with BUE Support (Still unable to rise without UE Support  and some posterior lean- CGA)  7/14 22.08 sec with UE pushing from arm rest. 39.30 sec with no UE support and min assist to prevent posterior LOB. 04/02/2024= 19.08 sec with min BUE support from arm rest; 04/02/2024= 32.35 sec without UE Support Goal status: PROGRESSING   3.  Patient will increase Berg Balance score by > 6 points to demonstrate decreased fall risk during functional activities Baseline: 34 7/14: 38 04/02/2024= 46 Goal status: ONGOING  4.  Patient will increase 10 meter walk test to >1.30m/s as to improve gait speed for better community ambulation and to reduce fall risk. Baseline: 0.53 m/s with 4WW; 01/15/2024= 0.58 m/s with 4WW 7/14: 0.9m/s; 04/02/2024= 0.68 m/s Goal status: PROGRESSING  5.  Patient will reduce timed up and go to <11 seconds to reduce fall risk and demonstrate improved transfer/gait  ability. Baseline: 33 sec with 4WW; 01/15/2024= 25.42 sec avg with 4WW 7/14: 20.72 sec avg with 4WW  Goal status: PROGRESSING  6. Patient will increase six minute walk test distance to >1000 for progression to community ambulator and improve gait ability Baseline: 310 feet using a 4WW with CGA and only completes 3:17 sec prior to requesting to sit secondary to fatigue; 01/15/2024= 610 feet in with 4WW 7/14: 613ft with 4WW; 03/21/24: 966ft 85m13sec (achieved a negative split); 04/02/2024= 790 feet using 4WW Goal status: PROGRESSING  ASSESSMENT:  CLINICAL IMPRESSION:  Patient presents with good motivation for completion of physical therapy activities. Pt showing increased fatigue this date and unable to ambulate safely with a cane. Seems to be coming form increased weakness and instability on the R LE hip musculature. Instructed pt on ways to improve this at home with hip abduction standing exercise. Pt will continue to benefit from skilled physical therapy intervention to address impairments, improve QOL, and attain therapy goals.   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   REHAB POTENTIAL: Good  CLINICAL DECISION MAKING: Evolving/moderate complexity  EVALUATION COMPLEXITY: Moderate  PLAN:  PT FREQUENCY: 1-2x/week  PT DURATION: 12 weeks  PLANNED INTERVENTIONS: 97164- PT Re-evaluation, 97750- Physical Performance Testing, 97110-Therapeutic exercises, 97530- Therapeutic activity, 97112- Neuromuscular re-education, 97535- Self Care, [id number]- Manual  therapy, [phone]- Gait training, [phone]- Orthotic Initial, [phone]- Orthotic/Prosthetic subsequent, [phone]- Canalith repositioning, Patient/Family education, Balance training, Stair training, Taping, Joint mobilization, Spinal mobilization, Vestibular training, DME instructions, Wheelchair mobility training, Cryotherapy, and Moist heat  PLAN FOR NEXT SESSION:    Bring attention to anterior compartment weakness and motor control impairment  Note: Portions of this document were prepared using Dragon voice recognition software and although reviewed may contain unintentional dictation errors in syntax, grammar, or spelling.  Lonni KATHEE Gainer PT ,DPT Physical Therapist- [place]  [facility]   10:23 AM, 04/18/24

## 2024-04-19 ENCOUNTER — Other Ambulatory Visit: Payer: Self-pay | Admitting: Physical Medicine and Rehabilitation

## 2024-04-20 ENCOUNTER — Other Ambulatory Visit: Payer: Self-pay

## 2024-04-20 ENCOUNTER — Other Ambulatory Visit: Payer: Self-pay | Admitting: Cardiology

## 2024-04-22 ENCOUNTER — Other Ambulatory Visit: Payer: Self-pay

## 2024-04-22 MED FILL — Losartan Potassium Tab 50 MG: ORAL | 90 days supply | Qty: 90 | Fill #0 | Status: AC

## 2024-04-22 MED FILL — Metoprolol Tartrate Tab 25 MG: ORAL | 90 days supply | Qty: 180 | Fill #0 | Status: AC

## 2024-04-22 MED FILL — Rosuvastatin Calcium Tab 20 MG: ORAL | 90 days supply | Qty: 90 | Fill #0 | Status: AC

## 2024-04-22 MED FILL — Clopidogrel Bisulfate Tab 75 MG (Base Equiv): ORAL | 90 days supply | Qty: 90 | Fill #0 | Status: AC

## 2024-04-23 ENCOUNTER — Ambulatory Visit: Admitting: Physical Therapy

## 2024-04-23 ENCOUNTER — Other Ambulatory Visit: Payer: Self-pay

## 2024-04-23 ENCOUNTER — Encounter: Admitting: Speech Pathology

## 2024-04-25 ENCOUNTER — Encounter: Admitting: Speech Pathology

## 2024-04-25 ENCOUNTER — Ambulatory Visit: Admitting: Physical Therapy

## 2024-04-25 DIAGNOSIS — R278 Other lack of coordination: Secondary | ICD-10-CM

## 2024-04-25 DIAGNOSIS — R262 Difficulty in walking, not elsewhere classified: Secondary | ICD-10-CM

## 2024-04-25 DIAGNOSIS — I63512 Cerebral infarction due to unspecified occlusion or stenosis of left middle cerebral artery: Secondary | ICD-10-CM

## 2024-04-25 DIAGNOSIS — R2681 Unsteadiness on feet: Secondary | ICD-10-CM

## 2024-04-25 DIAGNOSIS — M6281 Muscle weakness (generalized): Secondary | ICD-10-CM

## 2024-04-25 NOTE — Therapy (Signed)
 OUTPATIENT PHYSICAL THERAPY TREATMENT/Re-certification    Patient Name: [name] [name] MRN: [id number] DOB:10/29/1940, 83 y.o., male Today's Date: 04/25/2024  PCP: Rudy Alyce RAMAN, MD REFERRING PROVIDER:   Pegge Toribio PARAS, PA-C   END OF SESSION:   PT End of Session - 04/25/24 1336     Visit Number 35    Number of Visits 39    Date for Recertification  07/18/24    Authorization Time Period --    Progress Note Due on Visit 40    PT Start Time 1150    PT Stop Time 1230    PT Time Calculation (min) 40 min    Equipment Utilized During Treatment Gait belt    Activity Tolerance Patient tolerated treatment well;No increased pain;Other (comment)   elevated BP noted throughout session   Behavior During Therapy [facility] for tasks assessed/performed                Past Medical History:  Diagnosis Date   Arthritis    Depression    Diabetes mellitus without complication (HCC)    Gout    Hyperlipidemia    Hypertension    Ischemic cardiomyopathy    No past surgical history on file. 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   Ischemic cardiomyopathy 06/12/2018   Thrombocytosis 04/16/2016   Adjustment reaction with prolonged depressive reaction 01/22/2014   Visual loss, right eye 07/30/2012   Type 2 diabetes mellitus (HCC) 06/16/2011   Coronary artery disease 06/16/2011   Hypertension associated with diabetes (HCC) 06/16/2011   ONSET DATE: 11/16/2023 REFERRING DIAG: P36.487 (ICD-10-CM) - Acute ischemic left MCA stroke (HCC)  THERAPY DIAG:  Difficulty in walking, not elsewhere classified  Other lack of coordination  Unsteadiness on feet  Acute ischemic left MCA stroke (HCC)  Muscle weakness (generalized)  Rationale for Evaluation and Treatment: Rehabilitation  SUBJECTIVE:                                                                                                                                                                                              SUBJECTIVE STATEMENT:  Pt states that he is doing fine today, denies any pain or falls/stumbles. Pt continuing to use rollator to get to clinic.    PERTINENT HISTORY: Pt is a pleasant 83 y/o male referred to PT s/p L MCA stroke. Presented 11/16/2023 to [facility] with right facial droop left gaze preference and dysarthria. CT/MRI showed small acute nonhemorrhagic left MCA distribution infarction involving  the left frontal lobe. Pt admitted to rehab 11/19/2023 PT/ST/OT. Pt now presents to PT eval in WC. He reports he primarily uses a 4WW at home, but does not ambulate much during the day (maybe ten minutes total). He reports difficulty with standing up from chairs. He must use grab bars in his bathroom due to difficulty standing up from toilet. He has difficulty with stairs, requires help getting up step to enter his home. He reports no recent falls, but that he does stumble with his 4WW when fatigued. 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  PAIN:  Are you having pain? No- knee continues to feel much improved.   PRECAUTIONS: Fall; Per neurology 01/22/24, goal BP of 130-140/70-80  WEIGHT BEARING RESTRICTIONS: No  FALLS: Has patient fallen in last 6 months? No but does report stumbling with fatigue  LIVING ENVIRONMENT: Lives with: lives with their family son and DIL Lives in: House/apartment, two level home but not using upstairs, stays on first floor Stairs: 1 step to enter home, says no handrails but he has help getting up step Has following equipment at home: Walker - 4 wheeled, shower chair, and Grab bars  PLOF: Independent  PATIENT GOALS: everything: walking, balance, strength   OBJECTIVE:  Note: Objective measures were completed at Evaluation unless otherwise  noted.  DIAGNOSTIC FINDINGS: MR brain 11/16/23:  IMPRESSION: 1. Small acute nonhemorrhagic left MCA distribution infarct involving the left frontal lobe. No associated mass effect. 2. Additional punctate subcentimeter acute ischemic nonhemorrhagic infarct within the contralateral right basal ganglia. 3. Underlying moderately advanced chronic microvascular ischemic disease with a few scattered remote lacunar infarcts as above. 4. Chronic occlusion of the left vertebral artery.    Electronically Signed   By: Morene Hoard M.D.   On: 11/16/2023 23:48  CT ANGIO HEAD NECK 11/16/23  IMPRESSION: No large vessel occlusion or evidence of findings amenable to neurovascular intervention.   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   Multiple intracranial vascular stenoses as above. Focal short segment occlusion of an M2 inferior division branch of the right MCA with reconstitution noted.   Mild-to-moderate stenosis of the V4 segment right vertebral artery.   Emphysema (ICD10-J43.9).     Electronically Signed   By: Donnice Mania M.D.   On: 11/16/2023 15:50  CT HEAD 11/16/23: IMPRESSION: 1. 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 2. Subtle hypoattenuation at the anterior limb of the right internal capsule. 3. 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 4. Age indeterminate infarct in the left lentiform nucleus. 5. Moderate atrophy and white matter disease likely reflects the sequela of chronic microvascular ischemia. 6. No acute hemorrhage or mass lesion.   These results were called by telephone at the time of interpretation on 11/16/2023 at 3:13 pm to provider Dr. Matthews, who verbally acknowledged these results.     Electronically Signed   By: Lonni Necessary M.D.   On: 11/16/2023 15:14  TREATMENT DATE 04/25/24 :  Assessed vitals seated after gait into clinic: 165/92 (113), 61 bpm    After 5 min., reassessed BP: 155/90 mmHg   Gait 150' w/ 4# AWs With use of 4WW; pt demonstrating improved ability to maintain foot clearance this date RPE 13 BP after: 169/92 (115) 64 bpm   Seated LAQ w 4# AWs, 2x10 each LE  Standing hip extension w/ 4# Aws w/ BUE support at support bar, 2x10 each LE  Stair stepping, 2x10 each LE w/ BUE support at railings Verbal cueing for increased R glute activation with stance RPE 14; pt with some mild DOE Vitals immediately after: 175/88 (110), 65 bpm  After ~3 min. rest break: 149/86 (104)  Gait 150' w/ 4# Aws With use of 4WW; pt requiring some verbal cueing to maintain foot clearance with fatigue  Continued to monitor vitals:  167/80 (104) 64 bpm 156/88 (108) 64 bpm Pt educated throughout session of elevated BP levels vs. goal BP range set by neurology. Educated pt to contact PCP/provider regarding elevated BP levels. Pt verbalized understanding, stated that he would call doctor this evening regarding BP with intent to schedule an appointment.   Unless otherwise stated, CGA was provided and gait belt donned in order to ensure pt safety.    PATIENT EDUCATION: Education details: Pt educated throughout session about proper posture and technique with exercises. Improved exercise technique, movement at target joints, use of target muscles after min to mod verbal, visual, tactile cues.  Person educated: Patient Education method: Explanation, Demonstration, and Verbal cues Education comprehension: verbalized understanding and returned demonstration  HOME EXERCISE PROGRAM: Access Code: [email or web address] URL: [email or web address] Date: 01/30/2024 Prepared by: Massie Dollar  Exercises - Seated March  - 1 x daily - 7 x weekly - 2 sets - 10 reps - Seated Long Arc Quad  - 1 x daily - 7 x weekly - 2 sets - 10 reps - 3 sec hold - Standing Knee Flexion AROM with Chair Support  - 1 x daily - 7 x weekly - 2 sets - 10 reps - Side Stepping with Counter Support  - 1 x daily - 7 x weekly - 3 sets - 10 reps - Sit to Stand with Armchair  - 1 x daily - 7 x weekly - 3 sets - 10 reps - Walking with Counter Support  - 1 x daily - 7 x weekly - 3 sets - 10 reps - Semi-Tandem Balance at [employer] Open  - 1 x daily - 7 x weekly - 2 sets - 4 reps - 15 seconds hold - Standing March with Counter Support  - 1 x daily - 7 x weekly - 3 sets - 10 reps  GOALS: Goals reviewed with patient? Yes  SHORT TERM GOALS: Target date: 01/18/2024 Patient will be independent in home exercise program to improve strength/mobility for better functional independence with ADLs. Baseline: 01/15/2024= Patient able to verbalize and demonstrate his seated HEP without prompting- states compliance.  Goal status: MET  LONG TERM GOALS: Target date: 07/18/2024 1. Patient will increase SIS-16 score by at least 10 points  to demonstrate increased ease with ADLs and quality of life.  Baseline: 54, 7/23: 51 Goal status: ONGOING  2.  Patient (> 73 years old) will complete five times sit to stand test in < 15 seconds indicating an increased LE strength and improved balance. Baseline: 44.2 sec with use of BUE; 01/15/2024= 36.46 sec with BUE Support (Still unable to rise without  UE Support and some posterior lean- CGA)  7/14 22.08 sec with UE pushing from arm rest. 39.30 sec with no UE support and min assist to prevent posterior LOB. 04/02/2024= 19.08 sec with min BUE support from arm rest; 04/02/2024= 32.35 sec without UE Support Goal status: PROGRESSING   3.  Patient will increase Berg Balance score by > 6 points to demonstrate decreased fall risk during functional activities Baseline: 34 7/14: 38 04/02/2024=  46 Goal status: ONGOING  4.  Patient will increase 10 meter walk test to >1.47m/s as to improve gait speed for better community ambulation and to reduce fall risk. Baseline: 0.53 m/s with 4WW; 01/15/2024= 0.58 m/s with 4WW 7/14: 0.31m/s; 04/02/2024= 0.68 m/s Goal status: PROGRESSING  5.  Patient will reduce timed up and go to <11 seconds to reduce fall risk and demonstrate improved transfer/gait ability. Baseline: 33 sec with 4WW; 01/15/2024= 25.42 sec avg with 4WW 7/14: 20.72 sec avg with 4WW  Goal status: PROGRESSING  6. Patient will increase six minute walk test distance to >1000 for progression to community ambulator and improve gait ability Baseline: 310 feet using a 4WW with CGA and only completes 3:17 sec prior to requesting to sit secondary to fatigue; 01/15/2024= 610 feet in with 4WW 7/14: 675ft with 4WW; 03/21/24: 913ft 16m13sec (achieved a negative split); 04/02/2024= 790 feet using 4WW Goal status: PROGRESSING  ASSESSMENT:  CLINICAL IMPRESSION:   Pt with increased BP throughout session, limiting ability to participate with higher intensity interventions this date. Pt stated that he would contact MD regarding elevated BP this afternoon. Pt demonstrating some improvement with foot clearance during gait this date; continuing to require verbal cueing, especially with fatigue. Plan to assess all long term goals at next session. Pt continuing to demonstrate R glute weakness this date; requiring verbal cueing for increased glute activation during stair stepping activity. Pt will continue to benefit from skilled physical therapy intervention to address impairments, improve QOL, and attain therapy goals. Patient's condition has the potential to improve in response to therapy. Maximum improvement is yet to be obtained. The anticipated improvement is attainable and reasonable in a generally predictable time.     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   REHAB POTENTIAL: Good  CLINICAL DECISION MAKING: Evolving/moderate complexity  EVALUATION COMPLEXITY: Moderate  PLAN:  PT FREQUENCY: 1-2x/week  PT DURATION: 12 weeks  PLANNED INTERVENTIONS: [id number]- PT Re-evaluation, 97750- Physical Performance Testing, 97110-Therapeutic exercises, 97530- Therapeutic activity, 97112- Neuromuscular re-education, 97535- Self Care, [id number]- Manual therapy, [phone]- Gait training, [phone]- Orthotic Initial, [phone]- Orthotic/Prosthetic subsequent, [phone]- Canalith repositioning, Patient/Family education, Balance training, Stair training, Taping, Joint mobilization, Spinal mobilization, Vestibular training, DME instructions, Wheelchair mobility training, Cryotherapy, and Moist heat  PLAN FOR NEXT SESSION:   *Assess all LTG's at next visit*  Per neurology note 01/22/2024: target BP of 130-140/70-80 Activities to promote increased glute strengthening Bring attention to anterior compartment weakness and motor control impairment   Chiquita Silvan, SPT Physical Therapy Student - [place]  [facility]  5:01 PM, 04/25/24

## 2024-04-30 ENCOUNTER — Encounter: Admitting: Speech Pathology

## 2024-04-30 ENCOUNTER — Other Ambulatory Visit: Payer: Self-pay

## 2024-04-30 ENCOUNTER — Other Ambulatory Visit: Payer: Self-pay | Admitting: Physical Medicine and Rehabilitation

## 2024-04-30 ENCOUNTER — Ambulatory Visit: Admitting: Physical Therapy

## 2024-04-30 DIAGNOSIS — R262 Difficulty in walking, not elsewhere classified: Secondary | ICD-10-CM | POA: Diagnosis not present

## 2024-04-30 DIAGNOSIS — R2681 Unsteadiness on feet: Secondary | ICD-10-CM

## 2024-04-30 DIAGNOSIS — M6281 Muscle weakness (generalized): Secondary | ICD-10-CM

## 2024-04-30 NOTE — Therapy (Addendum)
 OUTPATIENT PHYSICAL THERAPY TREATMENT    Patient Name: [name] [name] MRN: [id number] DOB:08-01-1941, 83 y.o., male Today's Date: 04/30/2024  PCP: Rudy Alyce RAMAN, MD REFERRING PROVIDER:   Pegge Toribio PARAS, PA-C   END OF SESSION:   PT End of Session - 04/30/24 1031     Visit Number 36    Number of Visits 55    Date for Recertification  07/18/24    Progress Note Due on Visit 40    PT Start Time 1017    PT Stop Time 1057    PT Time Calculation (min) 40 min    Equipment Utilized During Treatment Gait belt    Activity Tolerance Patient tolerated treatment well;No increased pain;Other (comment)   elevated BP noted throughout session   Behavior During Therapy [facility] for tasks assessed/performed                Past Medical History:  Diagnosis Date   Arthritis    Depression    Diabetes mellitus without complication (HCC)    Gout    Hyperlipidemia    Hypertension    Ischemic cardiomyopathy    No past surgical history on file. 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   Ischemic cardiomyopathy 06/12/2018   Thrombocytosis 04/16/2016   Adjustment reaction with prolonged depressive reaction 01/22/2014   Visual loss, right eye 07/30/2012   Type 2 diabetes mellitus (HCC) 06/16/2011   Coronary artery disease 06/16/2011   Hypertension associated with diabetes (HCC) 06/16/2011   ONSET DATE: 11/16/2023 REFERRING DIAG: P36.487 (ICD-10-CM) - Acute ischemic left MCA stroke (HCC)  THERAPY DIAG:  Difficulty in walking, not elsewhere classified  Unsteadiness on feet  Muscle weakness (generalized)  Rationale for Evaluation and Treatment: Rehabilitation  SUBJECTIVE:                                                                                                                                                                                              SUBJECTIVE STATEMENT:  Pt states that he is doing fine today, denies any pain or falls/stumbles. Pt continuing to use rollator to get to clinic. Reports he has been checking his BP regularly.    PERTINENT HISTORY: Pt is a pleasant 83 y/o male referred to PT s/p L MCA stroke. Presented 11/16/2023 to [facility] with right facial droop left gaze preference and dysarthria. CT/MRI showed small acute nonhemorrhagic left MCA distribution infarction involving the left frontal lobe. Pt admitted to rehab 11/19/2023 PT/ST/OT. Pt  now presents to PT eval in WC. He reports he primarily uses a 4WW at home, but does not ambulate much during the day (maybe ten minutes total). He reports difficulty with standing up from chairs. He must use grab bars in his bathroom due to difficulty standing up from toilet. He has difficulty with stairs, requires help getting up step to enter his home. He reports no recent falls, but that he does stumble with his 4WW when fatigued. 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  PAIN:  Are you having pain? No- knee continues to feel much improved.   PRECAUTIONS: Fall; Per neurology 01/22/24, goal BP of 130-140/70-80  WEIGHT BEARING RESTRICTIONS: No  FALLS: Has patient fallen in last 6 months? No but does report stumbling with fatigue  LIVING ENVIRONMENT: Lives with: lives with their family son and DIL Lives in: House/apartment, two level home but not using upstairs, stays on first floor Stairs: 1 step to enter home, says no handrails but he has help getting up step Has following equipment at home: Walker - 4 wheeled, shower chair, and Grab bars  PLOF: Independent  PATIENT GOALS: everything: walking, balance, strength   OBJECTIVE:  Note: Objective measures were completed at Evaluation unless otherwise noted.  DIAGNOSTIC FINDINGS: MR brain 11/16/23:  IMPRESSION: 1. Small acute  nonhemorrhagic left MCA distribution infarct involving the left frontal lobe. No associated mass effect. 2. Additional punctate subcentimeter acute ischemic nonhemorrhagic infarct within the contralateral right basal ganglia. 3. Underlying moderately advanced chronic microvascular ischemic disease with a few scattered remote lacunar infarcts as above. 4. Chronic occlusion of the left vertebral artery.    Electronically Signed   By: Morene Hoard M.D.   On: 11/16/2023 23:48  CT ANGIO HEAD NECK 11/16/23  IMPRESSION: No large vessel occlusion or evidence of findings amenable to neurovascular intervention.   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   Multiple intracranial vascular stenoses as above. Focal short segment occlusion of an M2 inferior division branch of the right MCA with reconstitution noted.   Mild-to-moderate stenosis of the V4 segment right vertebral artery.   Emphysema (ICD10-J43.9).     Electronically Signed   By: Donnice Mania M.D.   On: 11/16/2023 15:50  CT HEAD 11/16/23: IMPRESSION: 1. 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 2. Subtle hypoattenuation at the anterior limb of the right internal capsule. 3. 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 4. Age indeterminate infarct in the left lentiform nucleus. 5. Moderate atrophy and white matter disease likely reflects the sequela of chronic microvascular ischemia. 6. No acute hemorrhage or mass lesion.   These results were called by telephone at the time of interpretation on 11/16/2023 at 3:13 pm to provider Dr. Matthews, who verbally acknowledged these results.    Electronically Signed   By: Lonni Necessary M.D.   On: 11/16/2023 15:14  TREATMENT DATE 04/30/24 :  TA- To improve functional movements patterns for everyday tasks    Nustep rolling hills x 6 min for B UE and LE reciprocal movement training.  BP: 146/77HR 66  mmHg    Gait 170' w/ 4# AWs With use of 4WW; pt demonstrating improved ability to maintain foot clearance this date RPE 13 Standing hip extension w/ 4# Aws w/ BUE support at support bar, 2x10 each LE  Stair stepping, 2x10 R LE only with L Uni UE support   Gait 150' w/ 4# Aws With use of 4WW; good foot clearance and step length rhroughout''  TE- To improve strength, endurance, mobility, and function of specific targeted muscle groups or improve joint range of motion or improve muscle flexibility  Seated LAQ 2 x 10 ea LE with 4#AW  Seated clamshell x 15 with black TB   Unless otherwise stated, CGA was provided and gait belt donned in order to ensure pt safety.    PATIENT EDUCATION: Education details: Pt educated throughout session about proper posture and technique with exercises. Improved exercise technique, movement at target joints, use of target muscles after min to mod verbal, visual, tactile cues.  Person educated: Patient Education method: Explanation, Demonstration, and Verbal cues Education comprehension: verbalized understanding and returned demonstration  HOME EXERCISE PROGRAM: Access Code: [email or web address] URL: [email or web address] Date: 01/30/2024 Prepared by: Massie Dollar  Exercises - Seated March  - 1 x daily - 7 x weekly - 2 sets - 10 reps - Seated Long Arc Quad  - 1 x daily - 7 x weekly - 2 sets - 10 reps - 3 sec hold - Standing Knee Flexion AROM with Chair Support  - 1 x daily - 7 x weekly - 2 sets - 10 reps - Side Stepping with Counter Support  - 1 x daily - 7 x weekly - 3 sets - 10 reps - Sit to Stand with Armchair  - 1 x daily - 7 x weekly - 3 sets - 10 reps - Walking with Counter Support  -  1 x daily - 7 x weekly - 3 sets - 10 reps - Semi-Tandem Balance at [employer] Open  - 1 x daily - 7 x weekly - 2 sets - 4 reps - 15 seconds hold - Standing March with Counter Support  - 1 x daily - 7 x weekly - 3 sets - 10 reps  GOALS: Goals reviewed with patient? Yes  SHORT TERM GOALS: Target date: 01/18/2024 Patient will be independent in home exercise program to improve strength/mobility for better functional independence with ADLs. Baseline: 01/15/2024= Patient able to verbalize and demonstrate his seated HEP without prompting- states compliance.  Goal status: MET  LONG TERM GOALS: Target date: 07/18/2024 1. Patient will increase SIS-16 score by at least 10 points  to demonstrate increased ease with ADLs and quality of life.  Baseline: 54, 7/23: 51 Goal status: ONGOING  2.  Patient (> 76 years old) will complete five times sit to stand test in < 15 seconds indicating an increased LE strength and improved balance. Baseline: 44.2 sec with use of BUE; 01/15/2024= 36.46 sec with BUE Support (Still unable to rise without UE Support and some posterior lean- CGA)  7/14 22.08 sec with UE pushing from arm rest. 39.30 sec with no UE support and min assist to prevent posterior LOB. 04/02/2024= 19.08 sec with min BUE support from arm rest; 04/02/2024= 32.35 sec without UE Support Goal status: PROGRESSING   3.  Patient will increase Berg Balance score by > 6 points to demonstrate decreased fall risk during functional activities Baseline: 34 7/14: 38 04/02/2024= 46 Goal status: ONGOING  4.  Patient will increase 10 meter walk test to >1.83m/s as to improve gait speed for better community ambulation and to reduce fall risk. Baseline: 0.53 m/s with 4WW; 01/15/2024= 0.58 m/s with 4WW 7/14: 0.39m/s; 04/02/2024= 0.68 m/s Goal status: PROGRESSING  5.  Patient will reduce timed up and go to <11 seconds to reduce fall risk and demonstrate improved transfer/gait ability. Baseline: 33 sec with 4WW; 01/15/2024=  25.42 sec avg with 4WW 7/14: 20.72 sec avg with 4WW  Goal status: PROGRESSING  6. Patient will increase six minute walk test distance to >1000 for progression to community ambulator and improve gait ability Baseline: 310 feet using a 4WW with CGA and only completes 3:17 sec prior to requesting to sit secondary to fatigue; 01/15/2024= 610 feet in with 4WW 7/14: 642ft with 4WW; 03/21/24: 958ft 68m13sec (achieved a negative split); 04/02/2024= 790 feet using 4WW Goal status: PROGRESSING  ASSESSMENT:  CLINICAL IMPRESSION:   Pt with increased BP throughout session, limiting ability to participate with higher intensity interventions this date. Pt showing improved step length and gait this date with rollator and BP was showing improved readings this date as well. Pt will continue to benefit from skilled physical therapy intervention to address impairments, improve QOL, and attain therapy goals. Patient's condition has the potential to improve in response to therapy. Maximum improvement is yet to be obtained. The anticipated improvement is attainable and reasonable in a generally predictable time.     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   REHAB POTENTIAL: Good  CLINICAL DECISION MAKING: Evolving/moderate complexity  EVALUATION COMPLEXITY: Moderate  PLAN:  PT FREQUENCY: 1-2x/week  PT DURATION: 12 weeks  PLANNED INTERVENTIONS: 97164- PT Re-evaluation, 97750- Physical Performance Testing, 97110-Therapeutic exercises, 97530- Therapeutic activity,  97112- Neuromuscular re-education, 97535- Self Care, [id number]- Manual therapy, [phone]- Gait training, [phone]- Orthotic Initial, [phone]- Orthotic/Prosthetic subsequent, [phone]- Canalith repositioning, Patient/Family education, Balance training, Stair training, Taping, Joint mobilization, Spinal mobilization, Vestibular training, DME instructions, Wheelchair mobility training, Cryotherapy, and Moist heat  PLAN FOR NEXT SESSION:   *Assess all LTG's at next visit*  Per neurology note 01/22/2024: target BP of 130-140/70-80 Activities to promote increased glute strengthening Bring attention to anterior compartment weakness and motor control impairment   Note: Portions of this document were prepared using Dragon voice recognition software and although reviewed may contain unintentional dictation errors in syntax, grammar, or spelling.  Lonni KATHEE Gainer PT ,DPT Physical Therapist- [place]  [facility]    2:52 PM, 04/30/24

## 2024-05-01 ENCOUNTER — Other Ambulatory Visit: Payer: Self-pay

## 2024-05-02 ENCOUNTER — Encounter: Admitting: Speech Pathology

## 2024-05-02 ENCOUNTER — Ambulatory Visit: Admitting: Physical Therapy

## 2024-05-02 DIAGNOSIS — M6281 Muscle weakness (generalized): Secondary | ICD-10-CM

## 2024-05-02 DIAGNOSIS — R262 Difficulty in walking, not elsewhere classified: Secondary | ICD-10-CM | POA: Diagnosis not present

## 2024-05-02 DIAGNOSIS — R2681 Unsteadiness on feet: Secondary | ICD-10-CM

## 2024-05-02 NOTE — Therapy (Addendum)
 OUTPATIENT PHYSICAL THERAPY TREATMENT    Patient Name: [name] [name] MRN: [id number] DOB:February 21, 1941, 83 y.o., male Today's Date: 05/02/2024  PCP: Rudy Alyce RAMAN, MD REFERRING PROVIDER:   Pegge Toribio PARAS, PA-C   END OF SESSION:   PT End of Session - 05/02/24 1108     Visit Number 37    Number of Visits 55    Date for Recertification  07/18/24    Progress Note Due on Visit 40    PT Start Time 1016    PT Stop Time 1058    PT Time Calculation (min) 42 min    Equipment Utilized During Treatment Gait belt    Activity Tolerance Patient tolerated treatment well;No increased pain;Other (comment)   elevated BP noted throughout session   Behavior During Therapy [facility] for tasks assessed/performed                 Past Medical History:  Diagnosis Date   Arthritis    Depression    Diabetes mellitus without complication (HCC)    Gout    Hyperlipidemia    Hypertension    Ischemic cardiomyopathy    No past surgical history on file. 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   Ischemic cardiomyopathy 06/12/2018   Thrombocytosis 04/16/2016   Adjustment reaction with prolonged depressive reaction 01/22/2014   Visual loss, right eye 07/30/2012   Type 2 diabetes mellitus (HCC) 06/16/2011   Coronary artery disease 06/16/2011   Hypertension associated with diabetes (HCC) 06/16/2011   ONSET DATE: 11/16/2023 REFERRING DIAG: P36.487 (ICD-10-CM) - Acute ischemic left MCA stroke (HCC)  THERAPY DIAG:  Difficulty in walking, not elsewhere classified  Unsteadiness on feet  Muscle weakness (generalized)  Rationale for Evaluation and Treatment: Rehabilitation  SUBJECTIVE:                                                                                                                                                                                              SUBJECTIVE STATEMENT:  Pt states that he is doing fine today, denies any pain or falls/stumbles. Pt continuing to use rollator to get to clinic.   PERTINENT HISTORY: Pt is a pleasant 83 y/o male referred to PT s/p L MCA stroke. Presented 11/16/2023 to [facility] with right facial droop left gaze preference and dysarthria. CT/MRI showed small acute nonhemorrhagic left MCA distribution infarction involving the left frontal lobe. Pt admitted to rehab 11/19/2023 PT/ST/OT. Pt now presents to PT eval in WC. He  reports he primarily uses a 4WW at home, but does not ambulate much during the day (maybe ten minutes total). He reports difficulty with standing up from chairs. He must use grab bars in his bathroom due to difficulty standing up from toilet. He has difficulty with stairs, requires help getting up step to enter his home. He reports no recent falls, but that he does stumble with his 4WW when fatigued. 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  PAIN:  Are you having pain? No- knee continues to feel much improved.   PRECAUTIONS: Fall; Per neurology 01/22/24, goal BP of 130-140/70-80  WEIGHT BEARING RESTRICTIONS: No  FALLS: Has patient fallen in last 6 months? No but does report stumbling with fatigue  LIVING ENVIRONMENT: Lives with: lives with their family son and DIL Lives in: House/apartment, two level home but not using upstairs, stays on first floor Stairs: 1 step to enter home, says no handrails but he has help getting up step Has following equipment at home: Walker - 4 wheeled, shower chair, and Grab bars  PLOF: Independent  PATIENT GOALS: everything: walking, balance, strength   OBJECTIVE:  Note: Objective measures were completed at Evaluation unless otherwise noted.  DIAGNOSTIC FINDINGS: MR brain 11/16/23:  IMPRESSION: 1. Small acute nonhemorrhagic left MCA distribution  infarct involving the left frontal lobe. No associated mass effect. 2. Additional punctate subcentimeter acute ischemic nonhemorrhagic infarct within the contralateral right basal ganglia. 3. Underlying moderately advanced chronic microvascular ischemic disease with a few scattered remote lacunar infarcts as above. 4. Chronic occlusion of the left vertebral artery.    Electronically Signed   By: Morene Hoard M.D.   On: 11/16/2023 23:48  CT ANGIO HEAD NECK 11/16/23  IMPRESSION: No large vessel occlusion or evidence of findings amenable to neurovascular intervention.   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   Multiple intracranial vascular stenoses as above. Focal short segment occlusion of an M2 inferior division branch of the right MCA with reconstitution noted.   Mild-to-moderate stenosis of the V4 segment right vertebral artery.   Emphysema (ICD10-J43.9).     Electronically Signed   By: Donnice Mania M.D.   On: 11/16/2023 15:50  CT HEAD 11/16/23: IMPRESSION: 1. 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 2. Subtle hypoattenuation at the anterior limb of the right internal capsule. 3. 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 4. Age indeterminate infarct in the left lentiform nucleus. 5. Moderate atrophy and white matter disease likely reflects the sequela of chronic microvascular ischemia. 6. No acute hemorrhage or mass lesion.   These results were called by telephone at the time of interpretation on 11/16/2023 at 3:13 pm to provider Dr. Matthews, who verbally acknowledged these results.    Electronically Signed   By: Lonni Necessary M.D.   On: 11/16/2023 15:14  TREATMENT DATE 05/02/24 :   *Assess all LTG's at next visit* Physical Performance Test or Measurement: a  physical performance test(s) or measurement (eg,  musculoskeletal, functional capacity), with written report,  each 15 mins   Five times Sit to Stand Test (FTSS)  TIME: 21.98 sec  Cut off scores indicative of increased fall risk: >12 sec CVA, >16 sec PD, >13 sec vestibular (ANPTA Core Set of Outcome Measures for Adults with Neurologic Conditions, 2018)  10 Meter Walk Test: Patient instructed to walk 10 meters (32.8 ft) as quickly and as safely as possible at their normal speed Results: .68 m/s  with 4WW  Cut off scores:   Household Ambulator  < 0.4 m/s  Limited Community Ambulator  0.4 - 0.8 m/s  [employer]  > 0.8 m/s  Increased fall risk  < 1.30m/s  Crossing a Street  >1.45m/s  MCID 0.05 m/s (small), 0.13 m/s (moderate), 0.06 m/s (significant)  (ANPTA Core Set of Outcome Measures for Adults with Neurologic Conditions, 2018)    PT instructed pt in TUG: 25 sec with rolllator  ( >13.5 sec indicates increased fall risk)  6 Min Walk Test:  Instructed patient to ambulate as quickly and as safely as possible for 6 minutes using LRAD. Patient was allowed to take standing rest breaks without stopping the test, but if the patient required a sitting rest break the clock would be stopped and the test would be over.  Results: 709 feet using a 4WW with SBA. Results indicate that the patient has reduced endurance with ambulation compared to age matched norms.  Age Matched Norms (in meters): 65-69 yo M: 25 F: 33, 29-79 yo M: 29 F: 471, 61-89 yo M: 417 F: 392 MDC: 58.21 meters (190.98 feet) or 50 meters (ANPTA Core Set of Outcome Measures for Adults with Neurologic Conditions, 2018)  TA- To improve functional movements patterns for everyday tasks   Nustep rolling hills setting x 8 min level 2-6 for B UE and LE reciprocal movement training  and for muscular and CV endurance     Unless otherwise stated, CGA was provided and gait belt donned in order to ensure pt safety.    PATIENT EDUCATION: Education details: Pt educated throughout session about proper posture and technique with exercises. Improved exercise technique, movement at target joints, use of target muscles after min to mod verbal, visual, tactile cues.  Person educated: Patient Education method: Explanation, Demonstration, and Verbal cues Education comprehension: verbalized understanding and returned demonstration  HOME EXERCISE PROGRAM: Access Code: [email or web address] URL: [email or web address] Date: 01/30/2024 Prepared by: Massie Dollar  Exercises - Seated March  - 1 x daily - 7 x weekly - 2 sets - 10 reps - Seated Long Arc Quad  - 1 x daily - 7 x weekly - 2 sets - 10 reps - 3 sec hold - Standing Knee Flexion AROM with Chair Support  - 1 x daily - 7 x weekly - 2 sets - 10 reps - Side Stepping with Counter Support  - 1 x daily - 7 x weekly - 3 sets - 10 reps - Sit to Stand with Armchair  - 1 x daily - 7 x weekly - 3 sets - 10 reps - Walking with Counter Support  - 1 x daily - 7 x weekly - 3 sets - 10 reps - Semi-Tandem Balance at [employer] Eyes Open  - 1 x daily - 7 x weekly - 2 sets - 4 reps - 15 seconds hold -  Standing March with Counter Support  - 1 x daily - 7 x weekly - 3 sets - 10 reps  GOALS: Goals reviewed with patient? Yes  SHORT TERM GOALS: Target date: 01/18/2024 Patient will be independent in home exercise program to improve strength/mobility for better functional independence with ADLs. Baseline: 01/15/2024= Patient able to verbalize and demonstrate his seated HEP without prompting- states compliance.  Goal status: MET  LONG TERM GOALS: Target date: 07/18/2024 1. Patient will increase SIS-16 score by at least 10 points  to demonstrate increased ease with ADLs and quality of life.  Baseline: 54, 7/23: 51  9/25:66 Goal status: MET  2.   Patient (> 35 years old) will complete five times sit to stand test in < 15 seconds indicating an increased LE strength and improved balance. Baseline: 44.2 sec with use of BUE; 01/15/2024= 36.46 sec with BUE Support (Still unable to rise without UE Support and some posterior lean- CGA)  7/14 22.08 sec with UE pushing from arm rest. 39.30 sec with no UE support and min assist to prevent posterior LOB. 04/02/2024= 19.08 sec with min BUE support from arm rest; 04/02/2024= 32.35 sec without UE Support 9/25: 21.98 no UE - unablewithout UE support from standard armchair  Goal status: ONGOING  3.  Patient will increase Berg Balance score by > 6 points to demonstrate decreased fall risk during functional activities Baseline: 34 7/14: 38 04/02/2024= 46 Goal status: MET  4.  Patient will increase 10 meter walk test to >1.77m/s as to improve gait speed for better community ambulation and to reduce fall risk. Baseline: 0.53 m/s with 4WW; 01/15/2024= 0.58 m/s with 4WW 7/14: 0.71m/s; 04/02/2024= 0.68 m/s 9/25.68 m/s Goal status: ONGOING  5.  Patient will reduce timed up and go to <11 seconds to reduce fall risk and demonstrate improved transfer/gait ability. Baseline: 33 sec with 4WW; 01/15/2024= 25.42 sec avg with 4WW 7/14: 20.72 sec avg with 4WW 20.19 sec with 4WW  Goal status: ONGOING  6. Patient will increase six minute walk test distance to >1000 for progression to community ambulator and improve gait ability Baseline: 310 feet using a 4WW with CGA and only completes 3:17 sec prior to requesting to sit secondary to fatigue; 01/15/2024= 610 feet in with 4WW 7/14: 646ft with 4WW; 03/21/24: 946ft 34m13sec (achieved a negative split); 04/02/2024= 790 feet using 4WW 9/25:709 ft Goal status: ONGOING  ASSESSMENT:  CLINICAL IMPRESSION:   Pt presents for goal reassessment activities this date. Pt shows little functional progress from previous goal reassessment. Instructed him he may be getting to the back end of  his PT interventions unless more significant progress is bale to be made at next assessment. Pt did meet his Stroke impact scale 16 goal indicating overall improvement in his difficulty with ADLs.  Pt verbalized understanding and importance of activity at home for progression of his function and mobility. Pt will continue to benefit from skilled physical therapy intervention to address impairments, improve QOL, and attain therapy goals.      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   REHAB POTENTIAL: Good  CLINICAL DECISION MAKING: Evolving/moderate complexity  EVALUATION COMPLEXITY: Moderate  PLAN:  PT FREQUENCY: 1-2x/week  PT DURATION: 12 weeks  PLANNED INTERVENTIONS: 97164- PT Re-evaluation, 97750- Physical Performance Testing, 97110-Therapeutic exercises, 97530- Therapeutic activity, 97112- Neuromuscular re-education, 97535- Self Care, [id number]- Manual therapy, [phone]- Gait training, [phone]- Orthotic Initial, [phone]- Orthotic/Prosthetic subsequent, [phone]- Canalith repositioning, Patient/Family education, Balance training, Stair training, Taping, Joint mobilization, Spinal mobilization, Vestibular training, DME instructions, Wheelchair mobility training, Cryotherapy, and Moist heat  PLAN FOR NEXT SESSION:     Per neurology note 01/22/2024: target BP of 130-140/70-80 Activities to promote increased glute strengthening Bring attention to anterior compartment weakness and motor control impairment   Note: Portions of this document were prepared using  Dragon voice recognition software and although reviewed may contain unintentional dictation errors in syntax, grammar, or spelling.  Lonni KATHEE Gainer PT ,DPT Physical Therapist- [place]  [facility]    11:41 AM, 05/02/24

## 2024-05-03 ENCOUNTER — Other Ambulatory Visit: Payer: Self-pay

## 2024-05-06 ENCOUNTER — Other Ambulatory Visit: Payer: Self-pay

## 2024-05-06 MED ORDER — TRAZODONE HCL 50 MG PO TABS
50.0000 mg | ORAL_TABLET | Freq: Every day | ORAL | 0 refills | Status: DC
  Filled 2024-05-06: qty 30, 30d supply, fill #0

## 2024-05-06 MED ORDER — LOSARTAN POTASSIUM 50 MG PO TABS
50.0000 mg | ORAL_TABLET | Freq: Every day | ORAL | 0 refills | Status: AC
  Filled 2024-05-06 – 2024-07-18 (×2): qty 30, 30d supply, fill #0

## 2024-05-06 MED ORDER — ROSUVASTATIN CALCIUM 20 MG PO TABS
20.0000 mg | ORAL_TABLET | Freq: Every day | ORAL | 0 refills | Status: AC
  Filled 2024-05-06 – 2024-07-18 (×2): qty 30, 30d supply, fill #0

## 2024-05-06 MED ORDER — METOPROLOL TARTRATE 25 MG PO TABS
25.0000 mg | ORAL_TABLET | Freq: Two times a day (BID) | ORAL | 0 refills | Status: AC
  Filled 2024-05-06 – 2024-07-18 (×2): qty 60, 30d supply, fill #0

## 2024-05-06 MED ORDER — CLOPIDOGREL BISULFATE 75 MG PO TABS
75.0000 mg | ORAL_TABLET | Freq: Every day | ORAL | 0 refills | Status: AC
  Filled 2024-05-06: qty 30, 30d supply, fill #0

## 2024-05-06 MED ORDER — AMANTADINE HCL 100 MG PO CAPS
100.0000 mg | ORAL_CAPSULE | Freq: Every day | ORAL | 0 refills | Status: DC
  Filled 2024-05-06: qty 30, 30d supply, fill #0

## 2024-05-07 ENCOUNTER — Ambulatory Visit: Admitting: Physical Therapy

## 2024-05-07 ENCOUNTER — Encounter: Admitting: Speech Pathology

## 2024-05-07 ENCOUNTER — Other Ambulatory Visit: Payer: Self-pay

## 2024-05-07 DIAGNOSIS — M6281 Muscle weakness (generalized): Secondary | ICD-10-CM

## 2024-05-07 DIAGNOSIS — R262 Difficulty in walking, not elsewhere classified: Secondary | ICD-10-CM

## 2024-05-07 DIAGNOSIS — R2681 Unsteadiness on feet: Secondary | ICD-10-CM

## 2024-05-07 NOTE — Therapy (Addendum)
 OUTPATIENT PHYSICAL THERAPY TREATMENT    Patient Name: [name] [name] MRN: [id number] DOB:05-Mar-1941, 83 y.o., male Today's Date: 05/07/2024  PCP: Rudy Alyce RAMAN, MD REFERRING PROVIDER:   Pegge Toribio PARAS, PA-C   END OF SESSION:   PT End of Session - 05/07/24 1009     Visit Number 38    Number of Visits 55    Date for Recertification  07/18/24    Progress Note Due on Visit 40    PT Start Time 1016    PT Stop Time 1057    PT Time Calculation (min) 41 min    Equipment Utilized During Treatment Gait belt    Activity Tolerance Patient tolerated treatment well;No increased pain;Other (comment)   elevated BP noted throughout session   Behavior During Therapy [facility] for tasks assessed/performed                 Past Medical History:  Diagnosis Date   Arthritis    Depression    Diabetes mellitus without complication (HCC)    Gout    Hyperlipidemia    Hypertension    Ischemic cardiomyopathy    No past surgical history on file. 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   Ischemic cardiomyopathy 06/12/2018   Thrombocytosis 04/16/2016   Adjustment reaction with prolonged depressive reaction 01/22/2014   Visual loss, right eye 07/30/2012   Type 2 diabetes mellitus (HCC) 06/16/2011   Coronary artery disease 06/16/2011   Hypertension associated with diabetes (HCC) 06/16/2011   ONSET DATE: 11/16/2023 REFERRING DIAG: P36.487 (ICD-10-CM) - Acute ischemic left MCA stroke (HCC)  THERAPY DIAG:  No diagnosis found.  Rationale for Evaluation and Treatment: Rehabilitation  SUBJECTIVE:                                                                                                                                                                                             SUBJECTIVE STATEMENT:  Pt states that he is doing fine  today, denies any pain or falls/stumbles. Pt continuing to use rollator to get to clinic.   PERTINENT HISTORY: Pt is a pleasant 83 y/o male referred to PT s/p L MCA stroke. Presented 11/16/2023 to [facility] with right facial droop left gaze preference and dysarthria. CT/MRI showed small acute nonhemorrhagic left MCA distribution infarction involving the left frontal lobe. Pt admitted to rehab 11/19/2023 PT/ST/OT. Pt now presents to PT eval in [place]. He reports he primarily uses a 4WW at home, but does not  ambulate much during the day (maybe ten minutes total). He reports difficulty with standing up from chairs. He must use grab bars in his bathroom due to difficulty standing up from toilet. He has difficulty with stairs, requires help getting up step to enter his home. He reports no recent falls, but that he does stumble with his 4WW when fatigued. 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  PAIN:  Are you having pain? No- knee continues to feel much improved.   PRECAUTIONS: Fall; Per neurology 01/22/24, goal BP of 130-140/70-80  WEIGHT BEARING RESTRICTIONS: No  FALLS: Has patient fallen in last 6 months? No but does report stumbling with fatigue  LIVING ENVIRONMENT: Lives with: lives with their family son and DIL Lives in: House/apartment, two level home but not using upstairs, stays on first floor Stairs: 1 step to enter home, says no handrails but he has help getting up step Has following equipment at home: Walker - 4 wheeled, shower chair, and Grab bars  PLOF: Independent  PATIENT GOALS: everything: walking, balance, strength   OBJECTIVE:  Note: Objective measures were completed at Evaluation unless otherwise noted.  DIAGNOSTIC FINDINGS: MR brain 11/16/23:  IMPRESSION: 1. Small acute nonhemorrhagic left MCA distribution infarct involving the left frontal lobe. No associated mass effect. 2. Additional punctate  subcentimeter acute ischemic nonhemorrhagic infarct within the contralateral right basal ganglia. 3. Underlying moderately advanced chronic microvascular ischemic disease with a few scattered remote lacunar infarcts as above. 4. Chronic occlusion of the left vertebral artery.    Electronically Signed   By: Morene Hoard M.D.   On: 11/16/2023 23:48  CT ANGIO HEAD NECK 11/16/23  IMPRESSION: No large vessel occlusion or evidence of findings amenable to neurovascular intervention.   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   Multiple intracranial vascular stenoses as above. Focal short segment occlusion of an M2 inferior division branch of the right MCA with reconstitution noted.   Mild-to-moderate stenosis of the V4 segment right vertebral artery.   Emphysema (ICD10-J43.9).     Electronically Signed   By: Donnice Mania M.D.   On: 11/16/2023 15:50  CT HEAD 11/16/23: IMPRESSION: 1. 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 2. Subtle hypoattenuation at the anterior limb of the right internal capsule. 3. 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 4. Age indeterminate infarct in the left lentiform nucleus. 5. Moderate atrophy and white matter disease likely reflects the sequela of chronic microvascular ischemia. 6. No acute hemorrhage or mass lesion.   These results were called by telephone at the time of interpretation on 11/16/2023 at 3:13 pm to provider Dr. Matthews, who verbally acknowledged these results.    Electronically Signed   By: Lonni Necessary M.D.   On: 11/16/2023 15:14                                                                                 TREATMENT DATE 05/07/24 :  TA- To improve functional  movements patterns for everyday tasks / GAIT TRAINING   Nustep rolling hills setting x 8 min level 2-6 for B UE and LE reciprocal movement training and for muscular and CV endurance   STS from elevated plinth x6 reps no UE assist @ 21 in height of bottom of table cushion  Gait with RW with 2.5# AW x 150 ft   STS x 5 reps from 20.5 in   Gait with RW with 2.5# AW x 150 ft cues for heel to toe gait for improved step length and gait quality   STS x 4 reps from 20 in   Gait with RW with 2.5# AW x 150 ft cues for heel to toe gait for improved step length and gait quality   STS x 4 reps from 19.5 in surface   Gait with RW with 2.5# AW x 150 ft cues for heel to toe gait for improved step length and gait quality   STS x 3 reps from 19 in surface   Gait with RW with 2.5# AW x 150 ft cues for heel to toe gait for improved step length and gait quality   Negative split 48M walking: times were respectively 15.5 s, 13.36 s,  12.08 s, 11.6 sec, 11.5 sec, 11.7 sec, 10.8 sec - good quality with 4WW throughout, encouraged to use same gait patter but to improve cadence/ turnover   Unless otherwise stated, CGA was provided and gait belt donned in order to ensure pt safety.    PATIENT EDUCATION: Education details: Pt educated throughout session about proper posture and technique with exercises. Improved exercise technique, movement at target joints, use of target muscles after min to mod verbal, visual, tactile cues.  Person educated: Patient Education method: Explanation, Demonstration, and Verbal cues Education comprehension: verbalized understanding and returned demonstration  HOME EXERCISE PROGRAM: Access Code: [email or web address] URL: [email or web address] Date: 01/30/2024 Prepared by: Massie Dollar  Exercises - Seated March  - 1 x daily - 7 x weekly - 2 sets - 10 reps - Seated Long Arc Quad  - 1 x daily - 7 x weekly - 2 sets - 10 reps - 3 sec hold - Standing Knee Flexion AROM with  Chair Support  - 1 x daily - 7 x weekly - 2 sets - 10 reps - Side Stepping with Counter Support  - 1 x daily - 7 x weekly - 3 sets - 10 reps - Sit to Stand with Armchair  - 1 x daily - 7 x weekly - 3 sets - 10 reps - Walking with Counter Support  - 1 x daily - 7 x weekly - 3 sets - 10 reps - Semi-Tandem Balance at [employer] Open  - 1 x daily - 7 x weekly - 2 sets - 4 reps - 15 seconds hold - Standing March with Counter Support  - 1 x daily - 7 x weekly - 3 sets - 10 reps  GOALS: Goals reviewed with patient? Yes  SHORT TERM GOALS: Target date: 01/18/2024 Patient will be independent in home exercise program to improve strength/mobility for better functional independence with ADLs. Baseline: 01/15/2024= Patient able to verbalize and demonstrate his seated HEP without prompting- states compliance.  Goal status: MET  LONG TERM GOALS: Target date: 07/18/2024 1. Patient will increase SIS-16 score by at least 10 points  to demonstrate increased ease with ADLs and quality of life.  Baseline: 54, 7/23: 51  9/25:66 Goal status: MET  2.  Patient (> 78 years old) will complete five times sit to stand test in < 15 seconds indicating an increased LE strength and improved balance. Baseline: 44.2 sec with use of BUE; 01/15/2024= 36.46 sec with BUE Support (Still unable to rise without UE Support and some posterior lean- CGA)  7/14 22.08 sec with UE pushing from arm rest. 39.30 sec with no UE support and min assist to prevent posterior LOB. 04/02/2024= 19.08 sec with min BUE support from arm rest; 04/02/2024= 32.35 sec without UE Support 9/25: 21.98 no UE - unablewithout UE support from standard armchair  Goal status: ONGOING  3.  Patient will increase Berg Balance score by > 6 points to demonstrate decreased fall risk during functional activities Baseline: 34 7/14: 38 04/02/2024= 46 Goal status: MET  4.  Patient will increase 10 meter walk test to >1.40m/s as to improve gait speed for better community  ambulation and to reduce fall risk. Baseline: 0.53 m/s with 4WW; 01/15/2024= 0.58 m/s with 4WW 7/14: 0.36m/s; 04/02/2024= 0.68 m/s 9/25.68 m/s Goal status: ONGOING  5.  Patient will reduce timed up and go to <11 seconds to reduce fall risk and demonstrate improved transfer/gait ability. Baseline: 33 sec with 4WW; 01/15/2024= 25.42 sec avg with 4WW 7/14: 20.72 sec avg with 4WW 20.19 sec with 4WW  Goal status: ONGOING  6. Patient will increase six minute walk test distance to >1000 for progression to community ambulator and improve gait ability Baseline: 310 feet using a 4WW with CGA and only completes 3:17 sec prior to requesting to sit secondary to fatigue; 01/15/2024= 610 feet in with 4WW 7/14: 644ft with 4WW; 03/21/24: 931ft 34m13sec (achieved a negative split); 04/02/2024= 790 feet using 4WW 9/25:709 ft Goal status: ONGOING  ASSESSMENT:  CLINICAL IMPRESSION:   Continued with current plan of care as laid out in evaluation and recent prior sessions.Pt showed great progress with gait quality and speed this date as well as with STS capabilities from progressively lower height. Pt was able to maintain good form with STS at lower heights making the task easier and with the intention of improving his independence and function. Pt closely monitored throughout session pt response and to maximize patient safety during interventions. Pt continues to demonstrate progress toward goals AEB progression of interventions this date either in volume or intensity. Pt will continue to benefit from skilled physical therapy intervention to address impairments, improve QOL, and attain therapy goals.     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   REHAB POTENTIAL: Good  CLINICAL DECISION MAKING: Evolving/moderate complexity  EVALUATION COMPLEXITY: Moderate  PLAN:  PT FREQUENCY: 1-2x/week  PT DURATION: 12 weeks  PLANNED INTERVENTIONS: 97164- PT Re-evaluation, 97750- Physical Performance Testing, 97110-Therapeutic exercises, 97530- Therapeutic activity, 97112- Neuromuscular re-education, 97535- Self Care, [id number]- Manual therapy, [phone]- Gait training, [phone]- Orthotic Initial, [phone]- Orthotic/Prosthetic subsequent, [phone]- Canalith repositioning, Patient/Family education, Balance training, Stair training, Taping, Joint mobilization, Spinal mobilization, Vestibular training, DME instructions, Wheelchair mobility training, Cryotherapy, and Moist heat  PLAN FOR NEXT SESSION:     Per neurology note 01/22/2024: target BP of 130-140/70-80 Activities to promote increased glute strengthening Bring attention to anterior compartment weakness and motor  control impairment  Note: Portions of this document were prepared using Dragon voice recognition software and although reviewed may contain unintentional dictation errors in syntax, grammar, or spelling.  Lonni KATHEE Gainer PT ,DPT Physical Therapist- [place]  [facility]    10:21 AM, 05/07/24

## 2024-05-08 ENCOUNTER — Ambulatory Visit: Admitting: Cardiology

## 2024-05-09 ENCOUNTER — Ambulatory Visit: Attending: Physician Assistant

## 2024-05-09 ENCOUNTER — Encounter: Admitting: Speech Pathology

## 2024-05-09 DIAGNOSIS — R2681 Unsteadiness on feet: Secondary | ICD-10-CM | POA: Insufficient documentation

## 2024-05-09 DIAGNOSIS — R262 Difficulty in walking, not elsewhere classified: Secondary | ICD-10-CM | POA: Diagnosis present

## 2024-05-09 DIAGNOSIS — R278 Other lack of coordination: Secondary | ICD-10-CM | POA: Diagnosis present

## 2024-05-09 DIAGNOSIS — I63512 Cerebral infarction due to unspecified occlusion or stenosis of left middle cerebral artery: Secondary | ICD-10-CM | POA: Insufficient documentation

## 2024-05-09 DIAGNOSIS — M6281 Muscle weakness (generalized): Secondary | ICD-10-CM | POA: Diagnosis present

## 2024-05-09 NOTE — Therapy (Signed)
 OUTPATIENT PHYSICAL THERAPY TREATMENT  Patient Name: [name] [name] MRN: [id number] DOB:04-29-1941, 83 y.o., male Today's Date: 05/09/2024  PCP: Rudy Alyce RAMAN, MD REFERRING PROVIDER:   Pegge Toribio PARAS, PA-C   END OF SESSION:   PT End of Session - 05/09/24 1025     Visit Number 39    Number of Visits 55    Date for Recertification  07/18/24    Progress Note Due on Visit 40    PT Start Time 1015    PT Stop Time 1055    PT Time Calculation (min) 40 min    Equipment Utilized During Treatment Gait belt    Activity Tolerance Patient tolerated treatment well;No increased pain;Other (comment)    Behavior During Therapy WFL for tasks assessed/performed                 Past Medical History:  Diagnosis Date   Arthritis    Depression    Diabetes mellitus without complication (HCC)    Gout    Hyperlipidemia    Hypertension    Ischemic cardiomyopathy    No past surgical history on file. 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   Ischemic cardiomyopathy 06/12/2018   Thrombocytosis 04/16/2016   Adjustment reaction with prolonged depressive reaction 01/22/2014   Visual loss, right eye 07/30/2012   Type 2 diabetes mellitus (HCC) 06/16/2011   Coronary artery disease 06/16/2011   Hypertension associated with diabetes (HCC) 06/16/2011   ONSET DATE: 11/16/2023 REFERRING DIAG: P36.487 (ICD-10-CM) - Acute ischemic left MCA stroke (HCC)  THERAPY DIAG:  Difficulty in walking, not elsewhere classified  Unsteadiness on feet  Muscle weakness (generalized)  Other lack of coordination  Rationale for Evaluation and Treatment: Rehabilitation  SUBJECTIVE:                                                                                                                                                                                              SUBJECTIVE STATEMENT:  Pt states that he is doing fine today.   PERTINENT HISTORY: Pt is a pleasant 83 y/o male referred to PT s/p L MCA stroke. Presented 11/16/2023 to [facility] with right facial droop left gaze preference and dysarthria. CT/MRI showed small acute nonhemorrhagic left MCA distribution infarction involving the left frontal lobe. Pt admitted to rehab 11/19/2023 PT/ST/OT. Pt now presents to PT eval in WC. He reports he primarily uses a 4WW at home, but does not ambulate much during the day (maybe  ten minutes total). He reports difficulty with standing up from chairs. He must use grab bars in his bathroom due to difficulty standing up from toilet. He has difficulty with stairs, requires help getting up step to enter his home. He reports no recent falls, but that he does stumble with his 4WW when fatigued. 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  PAIN:  Are you having pain? No- knee continues to feel much improved.   PRECAUTIONS: Fall; Per neurology 01/22/24, goal BP of 130-140/70-80  WEIGHT BEARING RESTRICTIONS: No  FALLS: Has patient fallen in last 6 months? No but does report stumbling with fatigue  LIVING ENVIRONMENT: Lives with: lives with their family son and DIL Lives in: House/apartment, two level home but not using upstairs, stays on first floor Stairs: 1 step to enter home, says no handrails but he has help getting up step Has following equipment at home: Walker - 4 wheeled, shower chair, and Grab bars  PLOF: Independent  PATIENT GOALS: everything: walking, balance, strength   OBJECTIVE:  Note: Objective measures were completed at Evaluation unless otherwise noted.  DIAGNOSTIC FINDINGS: MR brain 11/16/23:  IMPRESSION: 1. Small acute nonhemorrhagic left MCA distribution infarct involving the left frontal lobe. No associated mass effect. 2. Additional punctate subcentimeter  acute ischemic nonhemorrhagic infarct within the contralateral right basal ganglia. 3. Underlying moderately advanced chronic microvascular ischemic disease with a few scattered remote lacunar infarcts as above. 4. Chronic occlusion of the left vertebral artery.    Electronically Signed   By: Morene Hoard M.D.   On: 11/16/2023 23:48  CT ANGIO HEAD NECK 11/16/23  IMPRESSION: No large vessel occlusion or evidence of findings amenable to neurovascular intervention.   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   Multiple intracranial vascular stenoses as above. Focal short segment occlusion of an M2 inferior division branch of the right MCA with reconstitution noted.   Mild-to-moderate stenosis of the V4 segment right vertebral artery.   Emphysema (ICD10-J43.9).     Electronically Signed   By: Donnice Mania M.D.   On: 11/16/2023 15:50  CT HEAD 11/16/23: IMPRESSION: 1. 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 2. Subtle hypoattenuation at the anterior limb of the right internal capsule. 3. 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 4. Age indeterminate infarct in the left lentiform nucleus. 5. Moderate atrophy and white matter disease likely reflects the sequela of chronic microvascular ischemia. 6. No acute hemorrhage or mass lesion.   These results were called by telephone at the time of interpretation on 11/16/2023 at 3:13 pm to provider Dr. Matthews, who verbally acknowledged these results.    Electronically Signed   By: Lonni Necessary M.D.   On: 11/16/2023 15:14                                                                                 TREATMENT DATE 05/09/24 :   AMB overground 640ft c  4WW, 1.5lb AW  bilat (33m42sec) 3 min recovery AMB overground 657ft c 4WW, 1.5lb AW bilat (4m31sec) 3 min recovery Seated cable resisted single leg hip extension (beginning range) 3x12 @ 17.5lb (stirrup attachment)  AMB overground 180ft c 4WW, 6lb AW bilat 16m14sec AMB overground 112ft c 4WW, 7lb AW bilat 51m19sec *Rt lateral hip instability fatigue arises more quickly as weight increases    PATIENT EDUCATION: Education details: Pt educated throughout session about proper posture and technique with exercises. Improved exercise technique, movement at target joints, use of target muscles after min to mod verbal, visual, tactile cues.  Person educated: Patient Education method: Explanation, Demonstration, and Verbal cues Education comprehension: verbalized understanding and returned demonstration  HOME EXERCISE PROGRAM: Access Code: [email or web address] URL: [email or web address] Date: 01/30/2024 Prepared by: Massie Dollar  Exercises - Seated March  - 1 x daily - 7 x weekly - 2 sets - 10 reps - Seated Long Arc Quad  - 1 x daily - 7 x weekly - 2 sets - 10 reps - 3 sec hold - Standing Knee Flexion AROM with Chair Support  - 1 x daily - 7 x weekly - 2 sets - 10 reps - Side Stepping with Counter Support  - 1 x daily - 7 x weekly - 3 sets - 10 reps - Sit to Stand with Armchair  - 1 x daily - 7 x weekly - 3 sets - 10 reps - Walking with Counter Support  - 1 x daily - 7 x weekly - 3 sets - 10 reps - Semi-Tandem Balance at [employer] Open  - 1 x daily - 7 x weekly - 2 sets - 4 reps - 15 seconds hold - Standing March with Counter Support  - 1 x daily - 7 x weekly - 3 sets - 10 reps  GOALS: Goals reviewed with patient? Yes  SHORT TERM GOALS: Target date: 01/18/2024 Patient will be independent in home exercise program to improve strength/mobility for better functional independence with ADLs. Baseline: 01/15/2024= Patient able to verbalize and demonstrate his seated HEP without prompting- states  compliance.  Goal status: MET  LONG TERM GOALS: Target date: 07/18/2024 1. Patient will increase SIS-16 score by at least 10 points  to demonstrate increased ease with ADLs and quality of life.  Baseline: 54, 7/23: 51  9/25:66 Goal status: MET  2.  Patient (> 62 years old) will complete five times sit to stand test in < 15 seconds indicating an increased LE strength and improved balance. Baseline: 44.2 sec with use of BUE; 01/15/2024= 36.46 sec with BUE Support (Still unable to rise without UE Support and some posterior lean- CGA)  7/14 22.08 sec with UE pushing from arm rest. 39.30 sec with no UE support and min assist to prevent posterior LOB. 04/02/2024= 19.08 sec with min BUE support from arm rest; 04/02/2024= 32.35 sec without UE Support 9/25: 21.98 no UE - unablewithout UE support from standard armchair  Goal status: ONGOING  3.  Patient will increase Berg Balance score by > 6 points to demonstrate decreased fall risk during functional activities Baseline: 34 7/14: 38 04/02/2024= 46 Goal status: MET  4.  Patient will increase 10 meter walk test to >1.68m/s as to improve gait speed for better community ambulation and to reduce fall risk. Baseline: 0.53 m/s with 4WW; 01/15/2024= 0.58 m/s with 4WW 7/14: 0.38m/s; 04/02/2024= 0.68 m/s 9/25.68 m/s Goal status: ONGOING  5.  Patient will reduce timed up and go to <11 seconds to reduce  fall risk and demonstrate improved transfer/gait ability. Baseline: 33 sec with 4WW; 01/15/2024= 25.42 sec avg with 4WW 7/14: 20.72 sec avg with 4WW 20.19 sec with 4WW  Goal status: ONGOING  6. Patient will increase six minute walk test distance to >1000 for progression to community ambulator and improve gait ability Baseline: 310 feet using a 4WW with CGA and only completes 3:17 sec prior to requesting to sit secondary to fatigue; 01/15/2024= 610 feet in with 4WW 7/14: 669ft with 4WW; 03/21/24: 969ft 70m13sec (achieved a negative split); 04/02/2024= 790 feet using  4WW 9/25:709 ft Goal status: ONGOING  ASSESSMENT:  CLINICAL IMPRESSION:  Made efforts to advance AMB intensity and duration this date. Pt generally does well. Also changed with strength interventions fo rhip extension which remains functionally limiting and weak. Pt continues to demonstrate progress toward goals AEB progression of interventions this date either in volume or intensity. Pt will continue to benefit from skilled physical therapy intervention to address impairments, improve QOL, and attain therapy goals.     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  REHAB POTENTIAL: Good CLINICAL DECISION MAKING: Evolving/moderate complexity  EVALUATION COMPLEXITY: Moderate  PLAN:  PT FREQUENCY: 1-2x/week  PT DURATION: 12 weeks  PLANNED INTERVENTIONS: 97164- PT Re-evaluation, 97750- Physical Performance Testing, 97110-Therapeutic exercises, 97530- Therapeutic activity, 97112- Neuromuscular re-education, 97535- Self Care, [id number]- Manual therapy, [phone]- Gait training, [phone]- Orthotic Initial, [phone]- Orthotic/Prosthetic subsequent, [phone]- Canalith repositioning, Patient/Family education, Balance training, Stair training, Taping, Joint mobilization, Spinal mobilization, Vestibular training, DME instructions, Wheelchair mobility training, Cryotherapy, and Moist heat  PLAN FOR NEXT SESSION:   Per neurology note 01/22/2024: target BP of 130-140/70-80 Activities to promote increased glute strengthening Bring attention to anterior compartment weakness and  motor control impairment   10:27 AM, 05/09/24 Peggye JAYSON Linear, PT, DPT Physical Therapist - [facility]  Outpatient Physical Therapy- Main Campus [phone]

## 2024-05-10 ENCOUNTER — Ambulatory Visit: Admitting: Physical Medicine and Rehabilitation

## 2024-05-14 ENCOUNTER — Encounter: Admitting: Speech Pathology

## 2024-05-14 ENCOUNTER — Ambulatory Visit: Admitting: Physical Therapy

## 2024-05-14 DIAGNOSIS — R2681 Unsteadiness on feet: Secondary | ICD-10-CM

## 2024-05-14 DIAGNOSIS — R278 Other lack of coordination: Secondary | ICD-10-CM

## 2024-05-14 DIAGNOSIS — R262 Difficulty in walking, not elsewhere classified: Secondary | ICD-10-CM | POA: Diagnosis not present

## 2024-05-14 DIAGNOSIS — M6281 Muscle weakness (generalized): Secondary | ICD-10-CM

## 2024-05-14 NOTE — Therapy (Signed)
 OUTPATIENT PHYSICAL THERAPY TREATMENT  Patient Name: [name] [name] MRN: [id number] DOB:1941/08/01, 83 y.o., male Today's Date: 05/14/2024  PCP: Rudy Alyce RAMAN, MD REFERRING PROVIDER:   Pegge Toribio PARAS, PA-C   END OF SESSION:   PT End of Session - 05/14/24 1017     Visit Number 40    Number of Visits 55    Date for Recertification  07/18/24    Progress Note Due on Visit 40    PT Start Time 1017    PT Stop Time 1057    PT Time Calculation (min) 40 min    Equipment Utilized During Treatment Gait belt    Activity Tolerance Patient tolerated treatment well;No increased pain;Other (comment)    Behavior During Therapy WFL for tasks assessed/performed                  Past Medical History:  Diagnosis Date   Arthritis    Depression    Diabetes mellitus without complication (HCC)    Gout    Hyperlipidemia    Hypertension    Ischemic cardiomyopathy    No past surgical history on file. 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   Ischemic cardiomyopathy 06/12/2018   Thrombocytosis 04/16/2016   Adjustment reaction with prolonged depressive reaction 01/22/2014   Visual loss, right eye 07/30/2012   Type 2 diabetes mellitus (HCC) 06/16/2011   Coronary artery disease 06/16/2011   Hypertension associated with diabetes (HCC) 06/16/2011   ONSET DATE: 11/16/2023 REFERRING DIAG: P36.487 (ICD-10-CM) - Acute ischemic left MCA stroke (HCC)  THERAPY DIAG:  Difficulty in walking, not elsewhere classified  Unsteadiness on feet  Muscle weakness (generalized)  Other lack of coordination  Rationale for Evaluation and Treatment: Rehabilitation  SUBJECTIVE:                                                                                                                                                                                              SUBJECTIVE STATEMENT:  Pt states that he is doing fine today.   PERTINENT HISTORY: Pt is a pleasant 83 y/o male referred to PT s/p L MCA stroke. Presented 11/16/2023 to [facility] with right facial droop left gaze preference and dysarthria. CT/MRI showed small acute nonhemorrhagic left MCA distribution infarction involving the left frontal lobe. Pt admitted to rehab 11/19/2023 PT/ST/OT. Pt now presents to PT eval in WC. He reports he primarily uses a 4WW at home, but does not ambulate much during the day (  maybe ten minutes total). He reports difficulty with standing up from chairs. He must use grab bars in his bathroom due to difficulty standing up from toilet. He has difficulty with stairs, requires help getting up step to enter his home. He reports no recent falls, but that he does stumble with his 4WW when fatigued. 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  PAIN:  Are you having pain? No- knee continues to feel much improved.   PRECAUTIONS: Fall; Per neurology 01/22/24, goal BP of 130-140/70-80  WEIGHT BEARING RESTRICTIONS: No  FALLS: Has patient fallen in last 6 months? No but does report stumbling with fatigue  LIVING ENVIRONMENT: Lives with: lives with their family son and DIL Lives in: House/apartment, two level home but not using upstairs, stays on first floor Stairs: 1 step to enter home, says no handrails but he has help getting up step Has following equipment at home: Walker - 4 wheeled, shower chair, and Grab bars  PLOF: Independent  PATIENT GOALS: everything: walking, balance, strength   OBJECTIVE:  Note: Objective measures were completed at Evaluation unless otherwise noted.  DIAGNOSTIC FINDINGS: MR brain 11/16/23:  IMPRESSION: 1. Small acute nonhemorrhagic left MCA distribution infarct involving the left frontal lobe. No associated mass effect. 2. Additional punctate subcentimeter  acute ischemic nonhemorrhagic infarct within the contralateral right basal ganglia. 3. Underlying moderately advanced chronic microvascular ischemic disease with a few scattered remote lacunar infarcts as above. 4. Chronic occlusion of the left vertebral artery.    Electronically Signed   By: Morene Hoard M.D.   On: 11/16/2023 23:48  CT ANGIO HEAD NECK 11/16/23  IMPRESSION: No large vessel occlusion or evidence of findings amenable to neurovascular intervention.   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   Multiple intracranial vascular stenoses as above. Focal short segment occlusion of an M2 inferior division branch of the right MCA with reconstitution noted.   Mild-to-moderate stenosis of the V4 segment right vertebral artery.   Emphysema (ICD10-J43.9).     Electronically Signed   By: Donnice Mania M.D.   On: 11/16/2023 15:50  CT HEAD 11/16/23: IMPRESSION: 1. 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 2. Subtle hypoattenuation at the anterior limb of the right internal capsule. 3. 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 4. Age indeterminate infarct in the left lentiform nucleus. 5. Moderate atrophy and white matter disease likely reflects the sequela of chronic microvascular ischemia. 6. No acute hemorrhage or mass lesion.   These results were called by telephone at the time of interpretation on 11/16/2023 at 3:13 pm to provider Dr. Matthews, who verbally acknowledged these results.    Electronically Signed   By: Lonni Necessary M.D.   On: 11/16/2023 15:14                                                                                 TREATMENT DATE 05/14/24 :  Physical Performance Test or  Measurement: a   physical performance test(s) or measurement (eg,  musculoskeletal, functional capacity), with written report,  each 15 mins   6 Min Walk Test:  Instructed patient to ambulate as quickly and as safely as possible for 6 minutes using LRAD. Patient was allowed to take standing rest breaks without stopping the test, but if the patient required a sitting rest break the clock would be stopped and the test would be over.  Results: 740 ft feet using a 4WW with SBA. Results indicate that the patient has reduced endurance with ambulation compared to age matched norms.  Age Matched Norms (in meters): 24-69 yo M: 23 F: 5, 35-79 yo M: 64 F: 471, 36-89 yo M: 417 F: 392 MDC: 58.21 meters (190.98 feet) or 50 meters (ANPTA Core Set of Outcome Measures for Adults with Neurologic Conditions, 2018)  TA- To improve functional movements patterns for everyday tasks   Standing step up 2 x 12 ea LE with UE support for balance   Standing side stepping x 15 ea side x 2 sets with UE assist, cues for slight knee flexion  Standing heel raises 3 x 15 reps   Seated cable resisted single leg hip extension (beginning range) 2x15 @ 17.5lb (stirrup attachment)    PATIENT EDUCATION: Education details: Pt educated throughout session about proper posture and technique with exercises. Improved exercise technique, movement at target joints, use of target muscles after min to mod verbal, visual, tactile cues.  Person educated: Patient Education method: Explanation, Demonstration, and Verbal cues Education comprehension: verbalized understanding and returned demonstration  HOME EXERCISE PROGRAM: Access Code: [email or web address] URL: [email or web address] Date: 01/30/2024 Prepared by: Massie Dollar  Exercises - Seated March  - 1 x daily - 7 x weekly - 2 sets - 10 reps - Seated Long Arc Quad  - 1 x daily - 7 x weekly - 2 sets - 10 reps - 3 sec hold - Standing Knee Flexion AROM with Chair Support  - 1 x daily - 7 x  weekly - 2 sets - 10 reps - Side Stepping with Counter Support  - 1 x daily - 7 x weekly - 3 sets - 10 reps - Sit to Stand with Armchair  - 1 x daily - 7 x weekly - 3 sets - 10 reps - Walking with Counter Support  - 1 x daily - 7 x weekly - 3 sets - 10 reps - Semi-Tandem Balance at [employer] Open  - 1 x daily - 7 x weekly - 2 sets - 4 reps - 15 seconds hold - Standing March with Counter Support  - 1 x daily - 7 x weekly - 3 sets - 10 reps  GOALS: Goals reviewed with patient? Yes  SHORT TERM GOALS: Target date: 01/18/2024 Patient will be independent in home exercise program to improve strength/mobility for better functional independence with ADLs. Baseline: 01/15/2024= Patient able to verbalize and demonstrate his seated HEP without prompting- states compliance.  Goal status: MET  LONG TERM GOALS: Target date: 07/18/2024 1. Patient will increase SIS-16 score by at least 10 points  to demonstrate increased ease with ADLs and quality of life.  Baseline: 54, 7/23: 51  9/25:66 Goal status: MET  2.  Patient (> 53 years old) will complete five times sit to stand test in < 15 seconds indicating an increased LE strength and improved balance. Baseline: 44.2 sec with use of BUE; 01/15/2024= 36.46 sec with BUE Support (Still unable to rise without UE Support and  some posterior lean- CGA)  7/14 22.08 sec with UE pushing from arm rest. 39.30 sec with no UE support and min assist to prevent posterior LOB. 04/02/2024= 19.08 sec with min BUE support from arm rest; 04/02/2024= 32.35 sec without UE Support 9/25: 21.98 no UE - unablewithout UE support from standard armchair  Goal status: ONGOING  3.  Patient will increase Berg Balance score by > 6 points to demonstrate decreased fall risk during functional activities Baseline: 34 7/14: 38 04/02/2024= 46 Goal status: MET  4.  Patient will increase 10 meter walk test to >1.62m/s as to improve gait speed for better community ambulation and to reduce fall  risk. Baseline: 0.53 m/s with 4WW; 01/15/2024= 0.58 m/s with 4WW 7/14: 0.3m/s; 04/02/2024= 0.68 m/s 9/25.68 m/s  Goal status: ONGOING  5.  Patient will reduce timed up and go to <11 seconds to reduce fall risk and demonstrate improved transfer/gait ability. Baseline: 33 sec with 4WW; 01/15/2024= 25.42 sec avg with 4WW 7/14: 20.72 sec avg with 4WW 20.19 sec with 4WW  Goal status: ONGOING  6. Patient will increase six minute walk test distance to >1000 for progression to community ambulator and improve gait ability Baseline: 310 feet using a 4WW with CGA and only completes 3:17 sec prior to requesting to sit secondary to fatigue; 01/15/2024= 610 feet in with 4WW 7/14: 664ft with 4WW; 03/21/24: 932ft 62m13sec (achieved a negative split); 04/02/2024= 790 feet using 4WW 9/25:709 ft 10/7: 745 ft with 4WW, good reciprocal gait for most part Goal status: ONGOING  ASSESSMENT:  CLINICAL IMPRESSION:   Patient assessed for 6-minute walk test this date.  Patient shows some improvement since last session and overall showed improved foot clearance throughout ambulatory bout.  All other goals were assessed within last several visits so did not address remainder of goals this date.  Will continue with plan of care as previously described.Patient's condition has the potential to improve in response to therapy. Maximum improvement is yet to be obtained. The anticipated improvement is attainable and reasonable in a generally predictable time.  Pt will continue to benefit from skilled physical therapy intervention to address impairments, improve QOL, and attain therapy goals.   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  REHAB POTENTIAL: Good CLINICAL DECISION MAKING: Evolving/moderate complexity  EVALUATION COMPLEXITY: Moderate  PLAN:  PT FREQUENCY: 1-2x/week  PT DURATION: 12 weeks  PLANNED INTERVENTIONS: 97164- PT Re-evaluation, 97750- Physical Performance Testing, 97110-Therapeutic exercises, 97530- Therapeutic activity, 97112- Neuromuscular re-education, 97535- Self Care, [id number]- Manual therapy, [phone]- Gait training, [phone]- Orthotic Initial, [phone]- Orthotic/Prosthetic subsequent, [phone]- Canalith repositioning, Patient/Family education, Balance training, Stair training, Taping, Joint mobilization, Spinal mobilization, Vestibular training, DME instructions, Wheelchair mobility training, Cryotherapy, and Moist heat  PLAN FOR NEXT SESSION:    Per neurology note 01/22/2024: target BP of 130-140/70-80 Activities to promote increased glute strengthening Bring attention to anterior compartment weakness and motor control impairment   1:10 PM, 05/14/24 Note: Portions of this document were prepared using Dragon voice recognition software and although reviewed may contain unintentional dictation errors in syntax, grammar, or spelling.  Lonni KATHEE Gainer PT ,DPT Physical Therapist- [place]  [facility] [place]

## 2024-05-16 ENCOUNTER — Ambulatory Visit: Admitting: Physical Therapy

## 2024-05-16 ENCOUNTER — Encounter: Admitting: Speech Pathology

## 2024-05-16 DIAGNOSIS — R278 Other lack of coordination: Secondary | ICD-10-CM

## 2024-05-16 DIAGNOSIS — I63512 Cerebral infarction due to unspecified occlusion or stenosis of left middle cerebral artery: Secondary | ICD-10-CM

## 2024-05-16 DIAGNOSIS — M6281 Muscle weakness (generalized): Secondary | ICD-10-CM

## 2024-05-16 DIAGNOSIS — R262 Difficulty in walking, not elsewhere classified: Secondary | ICD-10-CM

## 2024-05-16 DIAGNOSIS — R2681 Unsteadiness on feet: Secondary | ICD-10-CM

## 2024-05-16 NOTE — Therapy (Signed)
 OUTPATIENT PHYSICAL THERAPY TREATMENT  Patient Name: [name] [name] MRN: [id number] DOB:02/03/1941, 83 y.o., male Today's Date: 05/16/2024  PCP: Rudy Alyce RAMAN, MD REFERRING PROVIDER:   Pegge Toribio PARAS, PA-C   END OF SESSION:   PT End of Session - 05/16/24 1115     Visit Number 41    Number of Visits 55    Date for Recertification  07/18/24    Progress Note Due on Visit 50    PT Start Time 1015    PT Stop Time 1057    PT Time Calculation (min) 42 min    Equipment Utilized During Treatment Gait belt    Activity Tolerance Patient tolerated treatment well;No increased pain;Other (comment)    Behavior During Therapy WFL for tasks assessed/performed                   Past Medical History:  Diagnosis Date   Arthritis    Depression    Diabetes mellitus without complication (HCC)    Gout    Hyperlipidemia    Hypertension    Ischemic cardiomyopathy    No past surgical history on file. 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   Ischemic cardiomyopathy 06/12/2018   Thrombocytosis 04/16/2016   Adjustment reaction with prolonged depressive reaction 01/22/2014   Visual loss, right eye 07/30/2012   Type 2 diabetes mellitus (HCC) 06/16/2011   Coronary artery disease 06/16/2011   Hypertension associated with diabetes (HCC) 06/16/2011   ONSET DATE: 11/16/2023 REFERRING DIAG: P36.487 (ICD-10-CM) - Acute ischemic left MCA stroke (HCC)  THERAPY DIAG:  Difficulty in walking, not elsewhere classified  Unsteadiness on feet  Muscle weakness (generalized)  Other lack of coordination  Acute ischemic left MCA stroke (HCC)  Rationale for Evaluation and Treatment: Rehabilitation  SUBJECTIVE:                                                                                                                                                                                              SUBJECTIVE STATEMENT:  Pt states that he is doing fine today. Has tried some more guitar practice and was able to get some notes but still no chords.   PERTINENT HISTORY: Pt is a pleasant 83 y/o male referred to PT s/p L MCA stroke. Presented [facility]/16/2023 to [facility] with right facial droop left gaze preference and dysarthria. CT/MRI showed small acute nonhemorrhagic left MCA distribution infarction involving the left frontal lobe. Pt admitted to rehab 11/19/2023 PT/ST/OT.  Pt now presents to PT eval in WC. He reports he primarily uses a 4WW at home, but does not ambulate much during the day (maybe ten minutes total). He reports difficulty with standing up from chairs. He must use grab bars in his bathroom due to difficulty standing up from toilet. He has difficulty with stairs, requires help getting up step to enter his home. He reports no recent falls, but that he does stumble with his 4WW when fatigued. 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  PAIN:  Are you having pain? No- knee continues to feel much improved.   PRECAUTIONS: Fall; Per neurology 01/22/24, goal BP of 130-140/70-80  WEIGHT BEARING RESTRICTIONS: No  FALLS: Has patient fallen in last 6 months? No but does report stumbling with fatigue  LIVING ENVIRONMENT: Lives with: lives with their family son and DIL Lives in: House/apartment, two level home but not using upstairs, stays on first floor Stairs: 1 step to enter home, says no handrails but he has help getting up step Has following equipment at home: Walker - 4 wheeled, shower chair, and Grab bars  PLOF: Independent  PATIENT GOALS: everything: walking, balance, strength   OBJECTIVE:  Note: Objective measures were completed at Evaluation unless otherwise noted.  DIAGNOSTIC FINDINGS: MR brain 11/16/23:  IMPRESSION: 1. Small acute nonhemorrhagic  left MCA distribution infarct involving the left frontal lobe. No associated mass effect. 2. Additional punctate subcentimeter acute ischemic nonhemorrhagic infarct within the contralateral right basal ganglia. 3. Underlying moderately advanced chronic microvascular ischemic disease with a few scattered remote lacunar infarcts as above. 4. Chronic occlusion of the left vertebral artery.    Electronically Signed   By: Morene Hoard M.D.   On: 11/16/2023 23:48  CT ANGIO HEAD NECK 11/16/23  IMPRESSION: No large vessel occlusion or evidence of findings amenable to neurovascular intervention.   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   Multiple intracranial vascular stenoses as above. Focal short segment occlusion of an M2 inferior division branch of the right MCA with reconstitution noted.   Mild-to-moderate stenosis of the V4 segment right vertebral artery.   Emphysema (ICD10-J43.9).     Electronically Signed   By: Donnice Mania M.D.   On: 11/16/2023 15:50  CT HEAD 11/16/23: IMPRESSION: 1. 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 2. Subtle hypoattenuation at the anterior limb of the right internal capsule. 3. 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 4. Age indeterminate infarct in the left lentiform nucleus. 5. Moderate atrophy and white matter disease likely reflects the sequela of chronic microvascular ischemia. 6. No acute hemorrhage or mass lesion.   These results were called by telephone at the time of interpretation on 11/16/2023 at 3:13 pm to provider Dr. Matthews, who verbally acknowledged these results.    Electronically Signed   By: Lonni Necessary M.D.   On: 11/16/2023 15:14  TREATMENT DATE 05/16/24 :   TA- To improve functional movements patterns for everyday tasks   STS from elevated plinth x 8 reps   Gait around lower level x 500 ft with 3# AW and with 4WW   STS from elevated plinth x 9 reps   Gait around lower level x 500 ft with 3# AW and with 4WW   Standing side step up LLE only 2 x 10 reps with UE A   Standing step up 2 x 12 ea LE with UE support for balance   Standing side stepping x 15 ea side x 2 sets with UE assist, cues for slight knee flexion and with RTB around knees   Standing heel raises 3 x 15 reps   NMR: To facilitate reeducation of movement, balance, posture, coordination, and/or proprioception/kinesthetic sense.  RLE on airex other on step 3 x 30 sec - high difficulty, able to maintain about 10 sec at a time before resetting or requiring PT assist   Unless otherwise stated, CGA was provided and gait belt donned in order to ensure pt safety    PATIENT EDUCATION: Education details: Pt educated throughout session about proper posture and technique with exercises. Improved exercise technique, movement at target joints, use of target muscles after min to mod verbal, visual, tactile cues.  Person educated: Patient Education method: Explanation, Demonstration, and Verbal cues Education comprehension: verbalized understanding and returned demonstration  HOME EXERCISE PROGRAM: Access Code: [email or web address] URL: [email or web address] Date: 01/30/2024 Prepared by: Massie Dollar  Exercises - Seated March  - 1 x daily - 7 x weekly - 2 sets - 10 reps - Seated Long Arc Quad  - 1 x daily - 7 x weekly - 2 sets - 10 reps - 3 sec hold - Standing Knee Flexion AROM with Chair Support  - 1 x daily - 7 x weekly - 2 sets - 10 reps - Side Stepping with Counter Support  - 1 x daily - 7 x weekly - 3 sets - 10 reps - Sit to Stand with Armchair  - 1 x daily - 7 x weekly - 3 sets - 10  reps - Walking with Counter Support  - 1 x daily - 7 x weekly - 3 sets - 10 reps - Semi-Tandem Balance at [employer] Open  - 1 x daily - 7 x weekly - 2 sets - 4 reps - 15 seconds hold - Standing March with Counter Support  - 1 x daily - 7 x weekly - 3 sets - 10 reps  GOALS: Goals reviewed with patient? Yes  SHORT TERM GOALS: Target date: 01/18/2024 Patient will be independent in home exercise program to improve strength/mobility for better functional independence with ADLs. Baseline: 01/15/2024= Patient able to verbalize and demonstrate his seated HEP without prompting- states compliance.  Goal status: MET  LONG TERM GOALS: Target date: 07/18/2024 1. Patient will increase SIS-16 score by at least 10 points  to demonstrate increased ease with ADLs and quality of life.  Baseline: 54, 7/23: 51  9/25:66 Goal status: MET  2.  Patient (> 60 years old) will complete five times sit to stand test in < 15 seconds indicating an increased LE strength and improved balance. Baseline: 44.2 sec with use of BUE; 01/15/2024= 36.46 sec with BUE Support (Still unable to rise without UE Support and some posterior lean- CGA)  7/14 22.08 sec with UE pushing from arm rest. 39.30 sec with no UE support and min assist to prevent posterior  LOB. 04/02/2024= 19.08 sec with min BUE support from arm rest; 04/02/2024= 32.35 sec without UE Support 9/25: 21.98 no UE - unablewithout UE support from standard armchair  Goal status: ONGOING  3.  Patient will increase Berg Balance score by > 6 points to demonstrate decreased fall risk during functional activities Baseline: 34 7/14: 38 04/02/2024= 46 Goal status: MET  4.  Patient will increase 10 meter walk test to >1.1m/s as to improve gait speed for better community ambulation and to reduce fall risk. Baseline: 0.53 m/s with 4WW; 01/15/2024= 0.58 m/s with 4WW 7/14: 0.18m/s; 04/02/2024= 0.68 m/s 9/25.68 m/s  Goal status: ONGOING  5.  Patient will reduce timed up and go to <11  seconds to reduce fall risk and demonstrate improved transfer/gait ability. Baseline: 33 sec with 4WW; 01/15/2024= 25.42 sec avg with 4WW 7/14: 20.72 sec avg with 4WW 20.19 sec with 4WW  Goal status: ONGOING  6. Patient will increase six minute walk test distance to >1000 for progression to community ambulator and improve gait ability Baseline: 310 feet using a 4WW with CGA and only completes 3:17 sec prior to requesting to sit secondary to fatigue; 01/15/2024= 610 feet in with 4WW 7/14: 652ft with 4WW; 03/21/24: 971ft 68m13sec (achieved a negative split); 04/02/2024= 790 feet using 4WW 9/25:709 ft 10/7: 745 ft with 4WW, good reciprocal gait for most part Goal status: ONGOING  ASSESSMENT:  CLINICAL IMPRESSION:   Patient arrived with good motivation for completion of pt activities.  Pt ambulates longer distances with rollater with resistance this date and able to have fairly good reciprocal movements. Pt continues to have profound R hip weakness targeted with many interventions this date. Pt will continue to benefit from skilled physical therapy intervention to address impairments, improve QOL, and attain therapy goals.    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  REHAB POTENTIAL: Good CLINICAL DECISION MAKING: Evolving/moderate complexity  EVALUATION COMPLEXITY: Moderate  PLAN:  PT FREQUENCY: 1-2x/week  PT DURATION: 12 weeks  PLANNED INTERVENTIONS: 97164- PT Re-evaluation, 97750- Physical Performance Testing, 97110-Therapeutic exercises, 97530-  Therapeutic activity, 97112- Neuromuscular re-education, 97535- Self Care, [id number]- Manual therapy, [phone]- Gait training, [phone]- Orthotic Initial, [phone]- Orthotic/Prosthetic subsequent, [phone]- Canalith repositioning, Patient/Family education, Balance training, Stair training, Taping, Joint mobilization, Spinal mobilization, Vestibular training, DME instructions, Wheelchair mobility training, Cryotherapy, and Moist heat  PLAN FOR NEXT SESSION:    Per neurology note 01/22/2024: target BP of 130-140/70-80 Activities to promote increased glute strengthening Bring attention to anterior compartment weakness and motor control impairment   11:20 AM, 05/16/24 Note: Portions of this document were prepared using Dragon voice recognition software and although reviewed may contain unintentional dictation errors in syntax, grammar, or spelling.  Lonni KATHEE Gainer PT ,DPT Physical Therapist- [place]  [facility]

## 2024-05-21 ENCOUNTER — Ambulatory Visit: Admitting: Physical Therapy

## 2024-05-21 ENCOUNTER — Encounter: Admitting: Speech Pathology

## 2024-05-21 DIAGNOSIS — R262 Difficulty in walking, not elsewhere classified: Secondary | ICD-10-CM

## 2024-05-21 DIAGNOSIS — R2681 Unsteadiness on feet: Secondary | ICD-10-CM

## 2024-05-21 DIAGNOSIS — M6281 Muscle weakness (generalized): Secondary | ICD-10-CM

## 2024-05-21 NOTE — Therapy (Signed)
 OUTPATIENT PHYSICAL THERAPY TREATMENT  Patient Name: [name] [name] MRN: [id number] DOB:04-05-1941, 83 y.o., male Today's Date: 05/21/2024  PCP: Rudy Alyce RAMAN, MD REFERRING PROVIDER:   Pegge Toribio PARAS, PA-C   END OF SESSION:   PT End of Session - 05/21/24 1023     Visit Number 42    Number of Visits 55    Date for Recertification  07/18/24    Progress Note Due on Visit 50    PT Start Time 1019    PT Stop Time 1057    PT Time Calculation (min) 38 min    Equipment Utilized During Treatment Gait belt    Activity Tolerance Patient tolerated treatment well;No increased pain;Other (comment)    Behavior During Therapy WFL for tasks assessed/performed                    Past Medical History:  Diagnosis Date   Arthritis    Depression    Diabetes mellitus without complication (HCC)    Gout    Hyperlipidemia    Hypertension    Ischemic cardiomyopathy    No past surgical history on file. 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   Ischemic cardiomyopathy 06/12/2018   Thrombocytosis 04/16/2016   Adjustment reaction with prolonged depressive reaction 01/22/2014   Visual loss, right eye 07/30/2012   Type 2 diabetes mellitus (HCC) 06/16/2011   Coronary artery disease 06/16/2011   Hypertension associated with diabetes (HCC) 06/16/2011   ONSET DATE: 11/16/2023 REFERRING DIAG: P36.487 (ICD-10-CM) - Acute ischemic left MCA stroke (HCC)  THERAPY DIAG:  Difficulty in walking, not elsewhere classified  Unsteadiness on feet  Muscle weakness (generalized)  Rationale for Evaluation and Treatment: Rehabilitation  SUBJECTIVE:                                                                                                                                                                                              SUBJECTIVE STATEMENT:  Pt states that he is feeling weakn today. Rode down in transport chair instead of using rollator. Had used the rollator the past several weeks.   PERTINENT HISTORY: Pt is a pleasant 83 y/o male referred to PT s/p L MCA stroke. Presented 11/16/2023 to [facility] with right facial droop left gaze preference and dysarthria. CT/MRI showed small acute nonhemorrhagic left MCA distribution infarction involving the left frontal lobe. Pt admitted to rehab 11/19/2023 PT/ST/OT. Pt now presents to PT eval in WC. He reports he  primarily uses a 4WW at home, but does not ambulate much during the day (maybe ten minutes total). He reports difficulty with standing up from chairs. He must use grab bars in his bathroom due to difficulty standing up from toilet. He has difficulty with stairs, requires help getting up step to enter his home. He reports no recent falls, but that he does stumble with his 4WW when fatigued. 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  PAIN:  Are you having pain? No- knee continues to feel much improved.   PRECAUTIONS: Fall; Per neurology 01/22/24, goal BP of 130-140/70-80  WEIGHT BEARING RESTRICTIONS: No  FALLS: Has patient fallen in last 6 months? No but does report stumbling with fatigue  LIVING ENVIRONMENT: Lives with: lives with their family son and DIL Lives in: House/apartment, two level home but not using upstairs, stays on first floor Stairs: 1 step to enter home, says no handrails but he has help getting up step Has following equipment at home: Walker - 4 wheeled, shower chair, and Grab bars  PLOF: Independent  PATIENT GOALS: everything: walking, balance, strength   OBJECTIVE:  Note: Objective measures were completed at Evaluation unless otherwise noted.  DIAGNOSTIC FINDINGS: MR brain 11/16/23:  IMPRESSION: 1. Small acute nonhemorrhagic left MCA distribution infarct involving  the left frontal lobe. No associated mass effect. 2. Additional punctate subcentimeter acute ischemic nonhemorrhagic infarct within the contralateral right basal ganglia. 3. Underlying moderately advanced chronic microvascular ischemic disease with a few scattered remote lacunar infarcts as above. 4. Chronic occlusion of the left vertebral artery.    Electronically Signed   By: Morene Hoard M.D.   On: 11/16/2023 23:48  CT ANGIO HEAD NECK 11/16/23  IMPRESSION: No large vessel occlusion or evidence of findings amenable to neurovascular intervention.   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   Multiple intracranial vascular stenoses as above. Focal short segment occlusion of an M2 inferior division branch of the right MCA with reconstitution noted.   Mild-to-moderate stenosis of the V4 segment right vertebral artery.   Emphysema (ICD10-J43.9).     Electronically Signed   By: Donnice Mania M.D.   On: 11/16/2023 15:50  CT HEAD 11/16/23: IMPRESSION: 1. 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 2. Subtle hypoattenuation at the anterior limb of the right internal capsule. 3. 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 4. Age indeterminate infarct in the left lentiform nucleus. 5. Moderate atrophy and white matter disease likely reflects the sequela of chronic microvascular ischemia. 6. No acute hemorrhage or mass lesion.   These results were called by telephone at the time of interpretation on 11/16/2023 at 3:13 pm to provider Dr. Matthews, who verbally acknowledged these results.    Electronically Signed   By: Lonni Necessary M.D.   On: 11/16/2023 15:14  TREATMENT DATE 05/21/24 :  Pt feeling weak this date BP assessed as below   BP: 130/72  mmHg with HR 72    TA- To improve functional movements patterns for everyday tasks / TE- To improve strength, endurance, mobility, and function of specific targeted muscle groups or improve joint range of motion or improve muscle flexibility  Nustep for B UE and lE reciprocal movement training and endurance x 9 min level 2   Seated LAQ 2 x 10 with 4# AW   STS x 8 reps   Seated heel raise 3 x 20 reps   STS from elevated plinth x 8 reps   Seated march 2 x 10 ea LE with 4# AW   STS from elevated plinth x 8 reps   Transfer to transport chair to end session   Pt required occasional rest breaks due fatigue, PT was attentive to when pt appeared to be tired or winded in order to prevent excessive fatigue.   Unless otherwise stated, CGA was provided and gait belt donned in order to ensure pt safety    PATIENT EDUCATION: Education details: Pt educated throughout session about proper posture and technique with exercises. Improved exercise technique, movement at target joints, use of target muscles after min to mod verbal, visual, tactile cues.  Person educated: Patient Education method: Explanation, Demonstration, and Verbal cues Education comprehension: verbalized understanding and returned demonstration  HOME EXERCISE PROGRAM: Access Code: [email or web address] URL: [email or web address] Date: 01/30/2024 Prepared by: Massie Dollar  Exercises - Seated March  - 1 x daily - 7 x weekly - 2 sets - 10 reps - Seated Long Arc Quad  - 1 x daily - 7 x weekly - 2 sets - 10 reps - 3 sec hold - Standing Knee Flexion AROM with Chair Support  - 1 x daily - 7 x weekly - 2 sets - 10 reps - Side Stepping with Counter Support  - 1 x daily - 7 x weekly - 3 sets - 10 reps - Sit to Stand with Armchair  - 1 x daily - 7 x weekly - 3 sets - 10 reps - Walking with Counter Support  - 1 x daily - 7 x  weekly - 3 sets - 10 reps - Semi-Tandem Balance at [employer] Open  - 1 x daily - 7 x weekly - 2 sets - 4 reps - 15 seconds hold - Standing March with Counter Support  - 1 x daily - 7 x weekly - 3 sets - 10 reps  GOALS: Goals reviewed with patient? Yes  SHORT TERM GOALS: Target date: 01/18/2024 Patient will be independent in home exercise program to improve strength/mobility for better functional independence with ADLs. Baseline: 01/15/2024= Patient able to verbalize and demonstrate his seated HEP without prompting- states compliance.  Goal status: MET  LONG TERM GOALS: Target date: 07/18/2024 1. Patient will increase SIS-16 score by at least 10 points  to demonstrate increased ease with ADLs and quality of life.  Baseline: 54, 7/23: 51  9/25:66 Goal status: MET  2.  Patient (> 28 years old) will complete five times sit to stand test in < 15 seconds indicating an increased LE strength and improved balance. Baseline: 44.2 sec with use of BUE; 01/15/2024= 36.46 sec with BUE Support (Still unable to rise without UE Support and some posterior lean- CGA)  7/14 22.08 sec with UE pushing from arm rest. 39.30 sec with no UE support and min assist to prevent posterior LOB. 04/02/2024= 19.08  sec with min BUE support from arm rest; 04/02/2024= 32.35 sec without UE Support 9/25: 21.98 no UE - unablewithout UE support from standard armchair  Goal status: ONGOING  3.  Patient will increase Berg Balance score by > 6 points to demonstrate decreased fall risk during functional activities Baseline: 34 7/14: 38 04/02/2024= 46 Goal status: MET  4.  Patient will increase 10 meter walk test to >1.13m/s as to improve gait speed for better community ambulation and to reduce fall risk. Baseline: 0.53 m/s with 4WW; 01/15/2024= 0.58 m/s with 4WW 7/14: 0.110m/s; 04/02/2024= 0.68 m/s 9/25.68 m/s  Goal status: ONGOING  5.  Patient will reduce timed up and go to <11 seconds to reduce fall risk and demonstrate improved  transfer/gait ability. Baseline: 33 sec with 4WW; 01/15/2024= 25.42 sec avg with 4WW 7/14: 20.72 sec avg with 4WW 20.19 sec with 4WW  Goal status: ONGOING  6. Patient will increase six minute walk test distance to >1000 for progression to community ambulator and improve gait ability Baseline: 310 feet using a 4WW with CGA and only completes 3:17 sec prior to requesting to sit secondary to fatigue; 01/15/2024= 610 feet in with 4WW 7/14: 670ft with 4WW; 03/21/24: 957ft 22m13sec (achieved a negative split); 04/02/2024= 790 feet using 4WW 9/25:709 ft 10/7: 745 ft with 4WW, good reciprocal gait for most part Goal status: ONGOING  ASSESSMENT:  CLINICAL IMPRESSION:   Patient arrived with good motivation for completion of pt activities.  Pt having a weak feeling day so PT interventions were less in volume compared to previous sessions.  Pt continues to have profound R hip weakness targeted with many interventions this date. Pt will continue to benefit from skilled physical therapy intervention to address impairments, improve QOL, and attain therapy goals.    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  REHAB POTENTIAL: Good CLINICAL DECISION MAKING: Evolving/moderate complexity  EVALUATION COMPLEXITY: Moderate  PLAN:  PT FREQUENCY: 1-2x/week  PT DURATION: 12 weeks  PLANNED INTERVENTIONS: 97164- PT Re-evaluation, 97750- Physical Performance Testing, 97110-Therapeutic exercises, 97530- Therapeutic activity, 97112- Neuromuscular re-education, 97535- Self  Care, 02859- Manual therapy, [phone]- Gait training, [phone]- Orthotic Initial, [phone]- Orthotic/Prosthetic subsequent, [phone]- Canalith repositioning, Patient/Family education, Balance training, Stair training, Taping, Joint mobilization, Spinal mobilization, Vestibular training, DME instructions, Wheelchair mobility training, Cryotherapy, and Moist heat  PLAN FOR NEXT SESSION:    Per neurology note 01/22/2024: target BP of 130-140/70-80 Activities to promote increased glute strengthening Bring attention to anterior compartment weakness and motor control impairment   1:42 PM, 05/21/24 Note: Portions of this document were prepared using Dragon voice recognition software and although reviewed may contain unintentional dictation errors in syntax, grammar, or spelling.  Lonni KATHEE Gainer PT ,DPT Physical Therapist- [place]  [facility]

## 2024-05-21 NOTE — Progress Notes (Deleted)
 Electrophysiology Office Follow up Visit Note:    Date:  05/21/2024   ID:  [name] [name], DOB [birth year], MRN [id number]  PCP:  Rudy Alyce RAMAN, MD  [facility] Cardiologist:  None  [facility] Electrophysiologist:  OLE ONEIDA HOLTS, MD    Interval History:     [name] [name] is a 83 y.o. male who presents for a follow up visit.   The patient last saw Elvie February 06, 2024.  The patient has a history of coronary artery disease with prior PCI, ischemic cardiomyopathy, hypertension, hyperlipidemia, PVCs, diabetes and recent cryptogenic stroke.  The patient was admitted in April of this year with a cryptogenic stroke.  He was discharged to inpatient rehab.        Past medical, surgical, social and family history were reviewed.  ROS:   Please see the history of present illness.    All other systems reviewed and are negative.  EKGs/Labs/Other Studies Reviewed:    The following studies were reviewed today:  March 15, 2024 heart monitor reviewed.  No atrial fibrillation.  November 17, 2023 echo EF 45% RV normal No significant valve abnormalities       Physical Exam:    VS:  There were no vitals taken for this visit.    Wt Readings from Last 3 Encounters:  01/08/24 186 lb 6.4 oz (84.6 kg)  11/21/23 195 lb 12.3 oz (88.8 kg)  11/17/23 203 lb 4.2 oz (92.2 kg)     GEN: no distress CARD: RRR, No MRG RESP: No IWOB. CTAB.      ASSESSMENT:    No diagnosis found. PLAN:    In order of problems listed above:  #Cryptogenic Stroke Pathophysiology of cryptogenic stroke was discussed in detail during today's clinic appointment. I discussed the role of loop recorder monitoring in patients who have suffered a CVA/TIA. There has been no evidence of AF thus far in the patient's evaluation. Loop recorder monitors were discussed in detail including the implant procedure and its risks. I discussed the monthly monitoring costs associated with loop recorder monitoring.  The patient would like to proceed with ILR implant.    Signed, OLE HOLTS, MD, [facility], [facility] 05/21/2024 8:40 AM    Electrophysiology [facility]  ---------------------------  SURGEON:  OLE ONEIDA HOLTS, MD     PREPROCEDURE DIAGNOSIS:  Cryptogenic stroke    POSTPROCEDURE DIAGNOSIS: Cryptogenic stroke     PROCEDURES:   1. Implantable loop recorder implantation    INTRODUCTION:  [name] [name] presents with a history of cryptogenic stroke The costs of loop recorder monitoring have been discussed with the patient.    DESCRIPTION OF PROCEDURE:  Informed written consent was obtained.  A preprocedural timeout was performed with the RN [place]). The patient required no sedation for the procedure today.  Mapping over the patient's chest was performed to identify the area where electrograms were most prominent for ILR recording.  This area was found to be the left parasternal region over the 4th intercostal space. The patients left chest was therefore prepped and draped in the usual sterile fashion. The skin overlying the left parasternal region was infiltrated with lidocaine for local analgesia.  A 0.5-cm incision was made over the left parasternal region over the 3rd intercostal space.  A subcutaneous ILR pocket was fashioned using a combination of sharp and blunt dissection.  A {LOOPLIST:[id number]} implantable loop recorder was then placed into the pocket  R waves were very prominent and measured >0.29mV.  Steri-  Strips and a sterile dressing were then applied.  There were no early apparent complications.     CONCLUSIONS:   1. Successful implantation of a implantable loop recorder for Cryptogenic stroke  2. No early apparent complications.   Ole T. Cindie, MD, [facility], [facility] Electrophysiology

## 2024-05-22 ENCOUNTER — Ambulatory Visit: Admitting: Cardiology

## 2024-05-23 ENCOUNTER — Encounter: Admitting: Speech Pathology

## 2024-05-23 ENCOUNTER — Ambulatory Visit: Admitting: Physical Therapy

## 2024-05-23 DIAGNOSIS — R262 Difficulty in walking, not elsewhere classified: Secondary | ICD-10-CM | POA: Diagnosis not present

## 2024-05-23 DIAGNOSIS — M6281 Muscle weakness (generalized): Secondary | ICD-10-CM

## 2024-05-23 DIAGNOSIS — R2681 Unsteadiness on feet: Secondary | ICD-10-CM

## 2024-05-23 DIAGNOSIS — R278 Other lack of coordination: Secondary | ICD-10-CM

## 2024-05-23 NOTE — Therapy (Addendum)
 OUTPATIENT PHYSICAL THERAPY TREATMENT  Patient Name: [name] [name] MRN: [id number] DOB:[birth year], 83 y.o., male Today's Date: 05/23/2024  PCP: Rudy Alyce RAMAN, MD REFERRING PROVIDER:   Pegge Toribio PARAS, PA-C   END OF SESSION:   PT End of Session - 05/23/24 1023     Visit Number 43   Number of Visits 55    Date for Recertification  07/18/24    Progress Note Due on Visit 50    PT Start Time 1021    PT Stop Time 1100    PT Time Calculation (min) 39 min    Equipment Utilized During Treatment Gait belt    Activity Tolerance Patient tolerated treatment well;No increased pain;Other (comment)    Behavior During Therapy WFL for tasks assessed/performed                    Past Medical History:  Diagnosis Date   Arthritis    Depression    Diabetes mellitus without complication (HCC)    Gout    Hyperlipidemia    Hypertension    Ischemic cardiomyopathy    No past surgical history on file. 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   Ischemic cardiomyopathy 06/12/2018   Thrombocytosis 04/16/2016   Adjustment reaction with prolonged depressive reaction 01/22/2014   Visual loss, right eye 07/30/2012   Type 2 diabetes mellitus (HCC) 06/16/2011   Coronary artery disease 06/16/2011   Hypertension associated with diabetes (HCC) 06/16/2011   ONSET DATE: 11/16/2023 REFERRING DIAG: P36.487 (ICD-10-CM) - Acute ischemic left MCA stroke (HCC)  THERAPY DIAG:  Difficulty in walking, not elsewhere classified  Unsteadiness on feet  Muscle weakness (generalized)  Other lack of coordination  Rationale for Evaluation and Treatment: Rehabilitation  SUBJECTIVE:                                                                                                                                                                                              SUBJECTIVE STATEMENT:  Pt states that he is feeling much better today. Ready to continue PT treatment.   PERTINENT HISTORY: Pt is a pleasant 83 y/o male referred to PT s/p L MCA stroke. Presented 11/16/2023 to [facility] with right facial droop left gaze preference and dysarthria. CT/MRI showed small acute nonhemorrhagic left MCA distribution infarction involving the left frontal lobe. Pt admitted to rehab 11/19/2023 PT/ST/OT. Pt now presents to PT eval in [place]. He reports he primarily uses a 4WW at home, but  does not ambulate much during the day (maybe ten minutes total). He reports difficulty with standing up from chairs. He must use grab bars in his bathroom due to difficulty standing up from toilet. He has difficulty with stairs, requires help getting up step to enter his home. He reports no recent falls, but that he does stumble with his 4WW when fatigued. 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  PAIN:  Are you having pain? No- knee continues to feel much improved.   PRECAUTIONS: Fall; Per neurology 01/22/24, goal BP of 130-140/70-80  WEIGHT BEARING RESTRICTIONS: No  FALLS: Has patient fallen in last 6 months? No but does report stumbling with fatigue  LIVING ENVIRONMENT: Lives with: lives with their family son and DIL Lives in: House/apartment, two level home but not using upstairs, stays on first floor Stairs: 1 step to enter home, says no handrails but he has help getting up step Has following equipment at home: Walker - 4 wheeled, shower chair, and Grab bars  PLOF: Independent  PATIENT GOALS: everything: walking, balance, strength   OBJECTIVE:  Note: Objective measures were completed at Evaluation unless otherwise noted.  DIAGNOSTIC FINDINGS: MR brain 11/16/23:  IMPRESSION: 1. Small acute nonhemorrhagic left MCA distribution infarct involving the left frontal lobe. No associated mass  effect. 2. Additional punctate subcentimeter acute ischemic nonhemorrhagic infarct within the contralateral right basal ganglia. 3. Underlying moderately advanced chronic microvascular ischemic disease with a few scattered remote lacunar infarcts as above. 4. Chronic occlusion of the left vertebral artery.    Electronically Signed   By: Morene Hoard M.D.   On: 11/16/2023 23:48  CT ANGIO HEAD NECK 11/16/23  IMPRESSION: No large vessel occlusion or evidence of findings amenable to neurovascular intervention.   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   Multiple intracranial vascular stenoses as above. Focal short segment occlusion of an M2 inferior division branch of the right MCA with reconstitution noted.   Mild-to-moderate stenosis of the V4 segment right vertebral artery.   Emphysema (ICD10-J43.9).     Electronically Signed   By: Donnice Mania M.D.   On: 11/16/2023 15:50  CT HEAD 11/16/23: IMPRESSION: 1. 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 2. Subtle hypoattenuation at the anterior limb of the right internal capsule. 3. 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 4. Age indeterminate infarct in the left lentiform nucleus. 5. Moderate atrophy and white matter disease likely reflects the sequela of chronic microvascular ischemia. 6. No acute hemorrhage or mass lesion.   These results were called by telephone at the time of interpretation on 11/16/2023 at 3:13 pm to provider Dr. Matthews, who verbally acknowledged these results.    Electronically Signed   By: Lonni Necessary M.D.   On: 11/16/2023 15:14                                                                                 TREATMENT DATE  05/23/24 :      Gait training with 5TT / TE- To improve strength, endurance, mobility, and function of specific targeted muscle groups or improve joint range of motion or improve muscle flexibility  Circuit training: performed x 3 bouts  Weighted gait for improved neuromotor recruitment and activity tolerance 366ft LAQ 4# x 10 bil  Hip flexion 4# x 15 bil  Hip abduction x 15 GTB  Sit<>stand with UE pushing from thighs on first bout and from arm rest on susequent bouts x 5. CGA- with intermittent min assist for full anterior weight shift.   Unless otherwise stated, CGA was provided and gait belt donned in order to ensure pt safety Therapeutic rest break between bouts. Min cues for improved hip flexion and heel strike throughout weighted gait training.     PATIENT EDUCATION: Education details: Pt educated throughout session about proper posture and technique with exercises. Improved exercise technique, movement at target joints, use of target muscles after min to mod verbal, visual, tactile cues.  Person educated: Patient Education method: Explanation, Demonstration, and Verbal cues Education comprehension: verbalized understanding and returned demonstration  HOME EXERCISE PROGRAM: Access Code: [email or web address] URL: [email or web address] Date: 01/30/2024 Prepared by: Massie Dollar  Exercises - Seated March  - 1 x daily - 7 x weekly - 2 sets - 10 reps - Seated Long Arc Quad  - 1 x daily - 7 x weekly - 2 sets - 10 reps - 3 sec hold - Standing Knee Flexion AROM with Chair Support  - 1 x daily - 7 x weekly - 2 sets - 10 reps - Side Stepping with Counter Support  - 1 x daily - 7 x weekly - 3 sets - 10 reps - Sit to Stand with Armchair  - 1 x daily - 7 x weekly - 3 sets - 10 reps - Walking with Counter Support  - 1 x daily - 7 x weekly - 3 sets - 10 reps - Semi-Tandem Balance at [employer] Open  - 1 x daily - 7 x weekly - 2 sets - 4 reps - 15 seconds hold - Standing March with Counter  Support  - 1 x daily - 7 x weekly - 3 sets - 10 reps  GOALS: Goals reviewed with patient? Yes  SHORT TERM GOALS: Target date: 01/18/2024 Patient will be independent in home exercise program to improve strength/mobility for better functional independence with ADLs. Baseline: 01/15/2024= Patient able to verbalize and demonstrate his seated HEP without prompting- states compliance.  Goal status: MET  LONG TERM GOALS: Target date: 07/18/2024 1. Patient will increase SIS-16 score by at least 10 points  to demonstrate increased ease with ADLs and quality of life.  Baseline: 54, 7/23: 51  9/25:66 Goal status: MET  2.  Patient (> 33 years old) will complete five times sit to stand test in < 15 seconds indicating an increased LE strength and improved balance. Baseline: 44.2 sec with use of BUE; 01/15/2024= 36.46 sec with BUE Support (Still unable to rise without UE Support and some posterior lean- CGA)  7/14 22.08 sec with UE pushing from arm rest. 39.30 sec with no UE support and min assist to prevent posterior LOB. 04/02/2024= 19.08 sec with min BUE support from arm rest; 04/02/2024= 32.35 sec without UE Support 9/25: 21.98 no UE - unablewithout UE support from standard armchair  Goal status: ONGOING  3.  Patient will increase Berg Balance score by > 6 points to demonstrate decreased fall risk during  functional activities Baseline: 34 7/14: 38 04/02/2024= 46 Goal status: MET  4.  Patient will increase 10 meter walk test to >1.33m/s as to improve gait speed for better community ambulation and to reduce fall risk. Baseline: 0.53 m/s with 4WW; 01/15/2024= 0.58 m/s with 4WW 7/14: 0.9m/s; 04/02/2024= 0.68 m/s 9/25.68 m/s  Goal status: ONGOING  5.  Patient will reduce timed up and go to <11 seconds to reduce fall risk and demonstrate improved transfer/gait ability. Baseline: 33 sec with 4WW; 01/15/2024= 25.42 sec avg with 4WW 7/14: 20.72 sec avg with 4WW 20.19 sec with 4WW  Goal status: ONGOING  6. Patient  will increase six minute walk test distance to >1000 for progression to community ambulator and improve gait ability Baseline: 310 feet using a 4WW with CGA and only completes 3:17 sec prior to requesting to sit secondary to fatigue; 01/15/2024= 610 feet in with 4WW 7/14: 628ft with 4WW; 03/21/24: 977ft 85m13sec (achieved a negative split); 04/02/2024= 790 feet using 4WW 9/25:709 ft 10/7: 745 ft with 4WW, good reciprocal gait for most part Goal status: ONGOING  ASSESSMENT:  CLINICAL IMPRESSION:   Patient arrived with good motivation for completion of pt activities. Continued high volume gait training with circuit training BLE weakness between bout of gait training. Was able to stand with UE supported on thighs for 1 bouts today, but still demonstrates difficulty with anterior weight shift.  Pt will continue to benefit from skilled physical therapy intervention to address impairments, improve QOL, and attain therapy goals.    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  REHAB POTENTIAL: Good CLINICAL DECISION MAKING: Evolving/moderate complexity  EVALUATION COMPLEXITY: Moderate  PLAN:  PT FREQUENCY: 1-2x/week  PT DURATION: 12 weeks  PLANNED INTERVENTIONS: [id number]- PT Re-evaluation, 97750- Physical Performance Testing, 97110-Therapeutic exercises, 97530- Therapeutic activity, 97112- Neuromuscular re-education, 97535- Self Care, [id number]- Manual therapy, [phone]- Gait training, [phone]- Orthotic Initial, [phone]- Orthotic/Prosthetic subsequent, [phone]- Canalith  repositioning, Patient/Family education, Balance training, Stair training, Taping, Joint mobilization, Spinal mobilization, Vestibular training, DME instructions, Wheelchair mobility training, Cryotherapy, and Moist heat  PLAN FOR NEXT SESSION:    Per neurology note 01/22/2024: target BP of 130-140/70-80 Activities to promote increased glute strengthening High intensity gait training with appropriate UE support    Massie FORBES Dollar PT ,DPT Physical Therapist- [place]  [facility]

## 2024-05-28 ENCOUNTER — Ambulatory Visit: Admitting: Physical Therapy

## 2024-05-30 ENCOUNTER — Other Ambulatory Visit: Payer: Self-pay

## 2024-05-30 ENCOUNTER — Ambulatory Visit: Admitting: Physical Therapy

## 2024-05-30 DIAGNOSIS — M6281 Muscle weakness (generalized): Secondary | ICD-10-CM

## 2024-05-30 DIAGNOSIS — R262 Difficulty in walking, not elsewhere classified: Secondary | ICD-10-CM

## 2024-05-30 DIAGNOSIS — R2681 Unsteadiness on feet: Secondary | ICD-10-CM

## 2024-05-30 NOTE — Therapy (Signed)
 OUTPATIENT PHYSICAL THERAPY TREATMENT  Patient Name: [name] [name] MRN: [id number] DOB:08/09/1940, 83 y.o., male Today's Date: 05/30/2024  PCP: Rudy Alyce RAMAN, MD REFERRING PROVIDER:   Pegge Toribio PARAS, PA-C   END OF SESSION:   PT End of Session - 05/30/24 1020     Visit Number 44    Number of Visits 55    Date for Recertification  07/18/24    Progress Note Due on Visit 50    PT Start Time 1017    PT Stop Time 1056    PT Time Calculation (min) 39 min    Equipment Utilized During Treatment Gait belt    Activity Tolerance Patient tolerated treatment well;No increased pain;Other (comment)    Behavior During Therapy WFL for tasks assessed/performed                     Past Medical History:  Diagnosis Date   Arthritis    Depression    Diabetes mellitus without complication (HCC)    Gout    Hyperlipidemia    Hypertension    Ischemic cardiomyopathy    No past surgical history on file. 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   Ischemic cardiomyopathy 06/12/2018   Thrombocytosis 04/16/2016   Adjustment reaction with prolonged depressive reaction 01/22/2014   Visual loss, right eye 07/30/2012   Type 2 diabetes mellitus (HCC) 06/16/2011   Coronary artery disease 06/16/2011   Hypertension associated with diabetes (HCC) 06/16/2011   ONSET DATE: 11/16/2023 REFERRING DIAG: P36.487 (ICD-10-CM) - Acute ischemic left MCA stroke (HCC)  THERAPY DIAG:  Difficulty in walking, not elsewhere classified  Unsteadiness on feet  Muscle weakness (generalized)  Rationale for Evaluation and Treatment: Rehabilitation  SUBJECTIVE:                                                                                                                                                                                              SUBJECTIVE STATEMENT:  Pt reports he is doing well. Reports he was abel to play Gcand D on guitar chords and is trying to progress to playing songs!   PERTINENT HISTORY: Pt is a pleasant 83 y/o male referred to PT s/p L MCA stroke. [facility]/16/[facility] to [name] with right facial droop left gaze preference and dysarthria. CT/MRI showed small acute nonhemorrhagic left MCA distribution infarction involving the left frontal lobe. Pt admitted to rehab 11/19/2023 PT/ST/OT. Pt now presents to PT eval in WC. He reports  he primarily uses a 4WW at home, but does not ambulate much during the day (maybe ten minutes total). He reports difficulty with standing up from chairs. He must use grab bars in his bathroom due to difficulty standing up from toilet. He has difficulty with stairs, requires help getting up step to enter his home. He reports no recent falls, but that he does stumble with his 4WW when fatigued. 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  PAIN:  Are you having pain? No- knee continues to feel much improved.   PRECAUTIONS: Fall; Per neurology 01/22/24, goal BP of 130-140/70-80  WEIGHT BEARING RESTRICTIONS: No  FALLS: Has patient fallen in last 6 months? No but does report stumbling with fatigue  LIVING ENVIRONMENT: Lives with: lives with their family son and DIL Lives in: House/apartment, two level home but not using upstairs, stays on first floor Stairs: 1 step to enter home, says no handrails but he has help getting up step Has following equipment at home: Walker - 4 wheeled, shower chair, and Grab bars  PLOF: Independent  PATIENT GOALS: everything: walking, balance, strength   OBJECTIVE:  Note: Objective measures were completed at Evaluation unless otherwise noted.  DIAGNOSTIC FINDINGS: MR brain 11/16/23:  IMPRESSION: 1. Small acute nonhemorrhagic left MCA distribution infarct involving the left frontal  lobe. No associated mass effect. 2. Additional punctate subcentimeter acute ischemic nonhemorrhagic infarct within the contralateral right basal ganglia. 3. Underlying moderately advanced chronic microvascular ischemic disease with a few scattered remote lacunar infarcts as above. 4. Chronic occlusion of the left vertebral artery.    Electronically Signed   By: Morene Hoard M.D.   On: 11/16/2023 23:48  CT ANGIO HEAD NECK 11/16/23  IMPRESSION: No large vessel occlusion or evidence of findings amenable to neurovascular intervention.   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   Multiple intracranial vascular stenoses as above. Focal short segment occlusion of an M2 inferior division branch of the right MCA with reconstitution noted.   Mild-to-moderate stenosis of the V4 segment right vertebral artery.   Emphysema (ICD10-J43.9).     Electronically Signed   By: Donnice Mania M.D.   On: 11/16/2023 15:50  CT HEAD 11/16/23: IMPRESSION: 1. 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 2. Subtle hypoattenuation at the anterior limb of the right internal capsule. 3. 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 4. Age indeterminate infarct in the left lentiform nucleus. 5. Moderate atrophy and white matter disease likely reflects the sequela of chronic microvascular ischemia. 6. No acute hemorrhage or mass lesion.   These results were called by telephone at the time of interpretation on 11/16/2023 at 3:13 pm to provider Dr. Matthews, who verbally acknowledged these results.    Electronically Signed   By: Lonni Necessary M.D.   On: 11/16/2023 15:14  TREATMENT DATE 05/30/24 :     TA- To improve functional movements patterns for everyday tasks    Gait with 4WW x 900 ft to medical arts building with 3# AW - cues for foot clearance  Sidestepping no UE support 2 x 5 laps along length of mat table. With 3# AW   Standing heel raise with UE support and 3# AW 4 x 10 reps with UE support   Standing side step on and off of 4 in step 2 x 10 reps ea LE   STS from elevated plinth 2 x 10 reps      PATIENT EDUCATION: Education details: Pt educated throughout session about proper posture and technique with exercises. Improved exercise technique, movement at target joints, use of target muscles after min to mod verbal, visual, tactile cues.  Person educated: Patient Education method: Explanation, Demonstration, and Verbal cues Education comprehension: verbalized understanding and returned demonstration  HOME EXERCISE PROGRAM: Access Code: [email or web address] URL: [email or web address] Date: 01/30/2024 Prepared by: Massie Dollar  Exercises - Seated March  - 1 x daily - 7 x weekly - 2 sets - 10 reps - Seated Long Arc Quad  - 1 x daily - 7 x weekly - 2 sets - 10 reps - 3 sec hold - Standing Knee Flexion AROM with Chair Support  - 1 x daily - 7 x weekly - 2 sets - 10 reps - Side Stepping with Counter Support  - 1 x daily - 7 x weekly - 3 sets - 10 reps - Sit to Stand with Armchair  - 1 x daily - 7 x weekly - 3 sets - 10 reps - Walking with Counter Support  - 1 x daily - 7 x weekly - 3 sets - 10 reps - Semi-Tandem Balance at [employer] Open  - 1 x daily - 7 x weekly - 2 sets - 4 reps - 15 seconds hold - Standing March with Counter Support  - 1 x daily - 7 x weekly - 3 sets - 10 reps  GOALS: Goals reviewed with patient? Yes  SHORT TERM GOALS: Target date: 01/18/2024 Patient will be independent in home exercise program to improve strength/mobility for better functional independence with ADLs. Baseline: 01/15/2024= Patient able to  verbalize and demonstrate his seated HEP without prompting- states compliance.  Goal status: MET  LONG TERM GOALS: Target date: 07/18/2024 1. Patient will increase SIS-16 score by at least 10 points  to demonstrate increased ease with ADLs and quality of life.  Baseline: 54, 7/23: 51  9/25:66 Goal status: MET  2.  Patient (> 64 years old) will complete five times sit to stand test in < 15 seconds indicating an increased LE strength and improved balance. Baseline: 44.2 sec with use of BUE; 01/15/2024= 36.46 sec with BUE Support (Still unable to rise without UE Support and some posterior lean- CGA)  7/14 22.08 sec with UE pushing from arm rest. 39.30 sec with no UE support and min assist to prevent posterior LOB. 04/02/2024= 19.08 sec with min BUE support from arm rest; 04/02/2024= 32.35 sec without UE Support 9/25: 21.98 no UE - unablewithout UE support from standard armchair  Goal status: ONGOING  3.  Patient will increase Berg Balance score by > 6 points to demonstrate decreased fall risk during functional activities Baseline: 34 7/14: 38 04/02/2024= 46 Goal status: MET  4.  Patient will increase 10 meter walk test to >1.56m/s as to improve gait speed for better community ambulation and to  reduce fall risk. Baseline: 0.53 m/s with 4WW; 01/15/2024= 0.58 m/s with 4WW 7/14: 0.57m/s; 04/02/2024= 0.68 m/s 9/25.68 m/s  Goal status: ONGOING  5.  Patient will reduce timed up and go to <11 seconds to reduce fall risk and demonstrate improved transfer/gait ability. Baseline: 33 sec with 4WW; 01/15/2024= 25.42 sec avg with 4WW 7/14: 20.72 sec avg with 4WW 20.19 sec with 4WW  Goal status: ONGOING  6. Patient will increase six minute walk test distance to >1000 for progression to community ambulator and improve gait ability Baseline: 310 feet using a 4WW with CGA and only completes 3:17 sec prior to requesting to sit secondary to fatigue; 01/15/2024= 610 feet in with 4WW 7/14: 635ft with 4WW; 03/21/24:  912ft 59m13sec (achieved a negative split); 04/02/2024= 790 feet using 4WW 9/25:709 ft 10/7: 745 ft with 4WW, good reciprocal gait for most part Goal status: ONGOING  ASSESSMENT:  CLINICAL IMPRESSION:   Patient arrived with good motivation for completion of pt activities.  Patient increased ambulation and distance with ankle weights this date and was able to maintain good foot clearance throughout showing improved lower extremity endurance.  Patient also continues to progress with frontal plane movements in order to strengthen his hip abductors as well as heel raises to continue to strengthen his gastrocnemius complex.  Patient making progress at home with functional activities as well as recreational activities such as being able to play some cords on his guitar for the first time since his stroke.  Pt will continue to benefit from skilled physical therapy intervention to address impairments, improve QOL, and attain therapy goals.    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  REHAB POTENTIAL: Good CLINICAL DECISION MAKING: Evolving/moderate complexity  EVALUATION COMPLEXITY: Moderate  PLAN:  PT FREQUENCY: 1-2x/week  PT DURATION: 12 weeks  PLANNED INTERVENTIONS: [id number]- PT Re-evaluation, 97750- Physical Performance Testing, 97110-Therapeutic exercises, 97530- Therapeutic activity, 97112- Neuromuscular re-education, 97535- Self Care, [id number]- Manual therapy, [phone]- Gait training, [phone]- Orthotic Initial, [phone]- Orthotic/Prosthetic subsequent, [phone]-  Canalith repositioning, Patient/Family education, Balance training, Stair training, Taping, Joint mobilization, Spinal mobilization, Vestibular training, DME instructions, Wheelchair mobility training, Cryotherapy, and Moist heat  PLAN FOR NEXT SESSION:    Per neurology note 01/22/2024: target BP of 130-140/70-80 Activities to promote increased glute strengthening High intensity gait training with appropriate UE support    Lonni KATHEE Gainer PT ,DPT Physical Therapist- [place]  [facility]

## 2024-05-31 ENCOUNTER — Other Ambulatory Visit: Payer: Self-pay

## 2024-05-31 MED ORDER — AMANTADINE HCL 100 MG PO CAPS
100.0000 mg | ORAL_CAPSULE | Freq: Every day | ORAL | 1 refills | Status: AC
  Filled 2024-05-31: qty 90, 90d supply, fill #0
  Filled 2024-07-18: qty 90, 90d supply, fill #1

## 2024-05-31 MED ORDER — TRAZODONE HCL 50 MG PO TABS
50.0000 mg | ORAL_TABLET | Freq: Every day | ORAL | 1 refills | Status: AC
  Filled 2024-05-31: qty 90, 90d supply, fill #0

## 2024-06-04 ENCOUNTER — Ambulatory Visit: Admitting: Physical Therapy

## 2024-06-04 DIAGNOSIS — R262 Difficulty in walking, not elsewhere classified: Secondary | ICD-10-CM

## 2024-06-04 DIAGNOSIS — R2681 Unsteadiness on feet: Secondary | ICD-10-CM

## 2024-06-04 DIAGNOSIS — M6281 Muscle weakness (generalized): Secondary | ICD-10-CM

## 2024-06-04 NOTE — Therapy (Signed)
 OUTPATIENT PHYSICAL THERAPY TREATMENT  Patient Name: [name] [name] MRN: [id number] DOB:1940/12/04, 83 y.o., male Today's Date: 06/04/2024  PCP: Rudy Alyce RAMAN, MD REFERRING PROVIDER:   Pegge Toribio PARAS, PA-C   END OF SESSION:   PT End of Session - 06/04/24 1249     Visit Number 45    Number of Visits 55    Date for Recertification  07/18/24    Progress Note Due on Visit 50    PT Start Time 1015    PT Stop Time 1057    PT Time Calculation (min) 42 min    Equipment Utilized During Treatment Gait belt    Activity Tolerance Patient tolerated treatment well;No increased pain;Other (comment)    Behavior During Therapy WFL for tasks assessed/performed                      Past Medical History:  Diagnosis Date   Arthritis    Depression    Diabetes mellitus without complication (HCC)    Gout    Hyperlipidemia    Hypertension    Ischemic cardiomyopathy    No past surgical history on file. 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   Ischemic cardiomyopathy 06/12/2018   Thrombocytosis 04/16/2016   Adjustment reaction with prolonged depressive reaction 01/22/2014   Visual loss, right eye 07/30/2012   Type 2 diabetes mellitus (HCC) 06/16/2011   Coronary artery disease 06/16/2011   Hypertension associated with diabetes (HCC) 06/16/2011   ONSET DATE: 11/16/2023 REFERRING DIAG: P36.487 (ICD-10-CM) - Acute ischemic left MCA stroke (HCC)  THERAPY DIAG:  Difficulty in walking, not elsewhere classified  Unsteadiness on feet  Muscle weakness (generalized)  Rationale for Evaluation and Treatment: Rehabilitation  SUBJECTIVE:                                                                                                                                                                                              SUBJECTIVE STATEMENT:  Pt reports he is doing well. No changes since last date.   PERTINENT HISTORY: Pt is a pleasant 83 y/o male referred to PT s/p L MCA stroke. Presented 11/16/2023 to [facility] with right facial droop left gaze preference and dysarthria. CT/MRI showed small acute nonhemorrhagic left MCA distribution infarction involving the left frontal lobe. Pt admitted to rehab 11/19/2023 PT/ST/OT. Pt now presents to PT eval in WC. He reports he primarily uses a 4WW at home, but does not ambulate much during  the day (maybe ten minutes total). He reports difficulty with standing up from chairs. He must use grab bars in his bathroom due to difficulty standing up from toilet. He has difficulty with stairs, requires help getting up step to enter his home. He reports no recent falls, but that he does stumble with his 4WW when fatigued. 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  PAIN:  Are you having pain? No- knee continues to feel much improved.   PRECAUTIONS: Fall; Per neurology 01/22/24, goal BP of 130-140/70-80  WEIGHT BEARING RESTRICTIONS: No  FALLS: Has patient fallen in last 6 months? No but does report stumbling with fatigue  LIVING ENVIRONMENT: Lives with: lives with their family son and DIL Lives in: House/apartment, two level home but not using upstairs, stays on first floor Stairs: 1 step to enter home, says no handrails but he has help getting up step Has following equipment at home: Walker - 4 wheeled, shower chair, and Grab bars  PLOF: Independent  PATIENT GOALS: everything: walking, balance, strength   OBJECTIVE:  Note: Objective measures were completed at Evaluation unless otherwise noted.  DIAGNOSTIC FINDINGS: MR brain 11/16/23:  IMPRESSION: 1. Small acute nonhemorrhagic left MCA distribution infarct involving the left frontal lobe. No associated mass effect. 2. Additional punctate subcentimeter  acute ischemic nonhemorrhagic infarct within the contralateral right basal ganglia. 3. Underlying moderately advanced chronic microvascular ischemic disease with a few scattered remote lacunar infarcts as above. 4. Chronic occlusion of the left vertebral artery.    Electronically Signed   By: Morene Hoard M.D.   On: 11/16/2023 23:48  CT ANGIO HEAD NECK 11/16/23  IMPRESSION: No large vessel occlusion or evidence of findings amenable to neurovascular intervention.   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   Multiple intracranial vascular stenoses as above. Focal short segment occlusion of an M2 inferior division branch of the right MCA with reconstitution noted.   Mild-to-moderate stenosis of the V4 segment right vertebral artery.   Emphysema (ICD10-J43.9).     Electronically Signed   By: Donnice Mania M.D.   On: 11/16/2023 15:50  CT HEAD 11/16/23: IMPRESSION: 1. 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 2. Subtle hypoattenuation at the anterior limb of the right internal capsule. 3. 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 4. Age indeterminate infarct in the left lentiform nucleus. 5. Moderate atrophy and white matter disease likely reflects the sequela of chronic microvascular ischemia. 6. No acute hemorrhage or mass lesion.   These results were called by telephone at the time of interpretation on 11/16/2023 at 3:13 pm to provider Dr. Matthews, who verbally acknowledged these results.    Electronically Signed   By: Lonni Necessary M.D.   On: 11/16/2023 15:14                                                                                 TREATMENT DATE 06/04/24 :  TA- To improve functional movements  patterns for everyday tasks  Sidestepping no UE support 3 x 4 laps along length of mat table. With 3# AW   Gait with 4WW x 170 ft to medical arts building with 3# AW - cues for foot clearance  Gait no AD x 20 ft to asses hip drop R side trendelenberg noteds   Side step up with R LE 2 x 10 reps on step trainer   STS with LLE elevated on step trainer 2 x 10 reps on elevated plinth   NMR: To facilitate reeducation of movement, balance, posture, coordination, and/or proprioception/kinesthetic sense.  RLE on airex left on step anterior 3 x 45 sec   Activity Description: sidestepping with blaze pod taps with dual task color recognition for which foot to tap with  Activity Setting:  random Number of Pods:  3 Cycles/Sets:  3 Duration (Time or Hit Count):  1:10 sec  Pt required occasional rest breaks due fatigue, PT was attentive to when pt appeared to be tired or winded in order to prevent excessive fatigue.        PATIENT EDUCATION: Education details: Pt educated throughout session about proper posture and technique with exercises. Improved exercise technique, movement at target joints, use of target muscles after min to mod verbal, visual, tactile cues.  Person educated: Patient Education method: Explanation, Demonstration, and Verbal cues Education comprehension: verbalized understanding and returned demonstration  HOME EXERCISE PROGRAM: Access Code: [email or web address] URL: [email or web address] Date: 01/30/2024 Prepared by: Massie Dollar  Exercises - Seated March  - 1 x daily - 7 x weekly - 2 sets - 10 reps - Seated Long Arc Quad  - 1 x daily - 7 x weekly - 2 sets - 10 reps - 3 sec hold - Standing Knee Flexion AROM with Chair Support  - 1 x daily - 7 x weekly - 2 sets - 10 reps - Side Stepping with Counter Support  - 1 x daily - 7 x weekly - 3 sets - 10 reps - Sit to Stand with Armchair  - 1 x daily - 7 x weekly - 3 sets - 10 reps - Walking with Counter Support  - 1 x  daily - 7 x weekly - 3 sets - 10 reps - Semi-Tandem Balance at [employer] Open  - 1 x daily - 7 x weekly - 2 sets - 4 reps - 15 seconds hold - Standing March with Counter Support  - 1 x daily - 7 x weekly - 3 sets - 10 reps  GOALS: Goals reviewed with patient? Yes  SHORT TERM GOALS: Target date: 01/18/2024 Patient will be independent in home exercise program to improve strength/mobility for better functional independence with ADLs. Baseline: 01/15/2024= Patient able to verbalize and demonstrate his seated HEP without prompting- states compliance.  Goal status: MET  LONG TERM GOALS: Target date: 07/18/2024 1. Patient will increase SIS-16 score by at least 10 points  to demonstrate increased ease with ADLs and quality of life.  Baseline: 54, 7/23: 51  9/25:66 Goal status: MET  2.  Patient (> 72 years old) will complete five times sit to stand test in < 15 seconds indicating an increased LE strength and improved balance. Baseline: 44.2 sec with use of BUE; 01/15/2024= 36.46 sec with BUE Support (Still unable to rise without UE Support and some posterior lean- CGA)  7/14 22.08 sec with UE pushing from arm rest. 39.30 sec with no UE support and min assist to  prevent posterior LOB. 04/02/2024= 19.08 sec with min BUE support from arm rest; 04/02/2024= 32.35 sec without UE Support 9/25: 21.98 no UE - unablewithout UE support from standard armchair  Goal status: ONGOING  3.  Patient will increase Berg Balance score by > 6 points to demonstrate decreased fall risk during functional activities Baseline: 34 7/14: 38 04/02/2024= 46 Goal status: MET  4.  Patient will increase 10 meter walk test to >1.16m/s as to improve gait speed for better community ambulation and to reduce fall risk. Baseline: 0.53 m/s with 4WW; 01/15/2024= 0.58 m/s with 4WW 7/14: 0.66m/s; 04/02/2024= 0.68 m/s 9/25.68 m/s  Goal status: ONGOING  5.  Patient will reduce timed up and go to <11 seconds to reduce fall risk and demonstrate  improved transfer/gait ability. Baseline: 33 sec with 4WW; 01/15/2024= 25.42 sec avg with 4WW 7/14: 20.72 sec avg with 4WW 20.19 sec with 4WW  Goal status: ONGOING  6. Patient will increase six minute walk test distance to >1000 for progression to community ambulator and improve gait ability Baseline: 310 feet using a 4WW with CGA and only completes 3:17 sec prior to requesting to sit secondary to fatigue; 01/15/2024= 610 feet in with 4WW 7/14: 663ft with 4WW; 03/21/24: 920ft 66m13sec (achieved a negative split); 04/02/2024= 790 feet using 4WW 9/25:709 ft 10/7: 745 ft with 4WW, good reciprocal gait for most part Goal status: ONGOING  ASSESSMENT:  CLINICAL IMPRESSION:   Patient arrived with good motivation for completion of pt activities. Focussed on R LE hip weakness throughout session. Pt does demonstrate R trendelenberg gait with gait without device. The patient demonstrated significant progress while utilizing [employer], showcasing improved coordination, balance, and cognitive function. The incorporation of dual-tasking technology with color recognition and association with specific movements in Blaze Pods was strategically chosen to provide a dynamic training environment, enabling the patient to engage in simultaneous physical and cognitive tasks. This unique approach enhances not only their physical abilities but also fosters increased neural connectivity and mental awareness, contributing to a well-rounded and effective rehabilitation and training experience.  Pt will continue to benefit from skilled physical therapy intervention to address impairments, improve QOL, and attain therapy goals.    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  REHAB POTENTIAL: Good CLINICAL DECISION MAKING: Evolving/moderate complexity  EVALUATION COMPLEXITY: Moderate  PLAN:  PT FREQUENCY: 1-2x/week  PT DURATION: 12 weeks  PLANNED INTERVENTIONS: [id number]- PT Re-evaluation, 97750- Physical Performance Testing, 97110-Therapeutic exercises, 97530- Therapeutic activity, 97112- Neuromuscular re-education, 97535- Self Care, [id number]- Manual therapy, [phone]- Gait training, [phone]- Orthotic Initial, [phone]- Orthotic/Prosthetic subsequent, [phone]- Canalith repositioning, Patient/Family education, Balance training, Stair training, Taping, Joint mobilization, Spinal mobilization, Vestibular training, DME instructions, Wheelchair mobility training, Cryotherapy, and Moist heat  PLAN FOR NEXT SESSION:    Per neurology note 01/22/2024: target BP of 130-140/70-80 Activities to promote increased glute strengthening High intensity gait training with appropriate UE support    Lonni KATHEE Gainer PT ,DPT Physical Therapist- [place]  [facility]

## 2024-06-06 ENCOUNTER — Ambulatory Visit: Admitting: Physical Therapy

## 2024-06-06 DIAGNOSIS — R262 Difficulty in walking, not elsewhere classified: Secondary | ICD-10-CM

## 2024-06-06 DIAGNOSIS — R2681 Unsteadiness on feet: Secondary | ICD-10-CM

## 2024-06-06 DIAGNOSIS — M6281 Muscle weakness (generalized): Secondary | ICD-10-CM

## 2024-06-06 NOTE — Therapy (Signed)
 OUTPATIENT PHYSICAL THERAPY TREATMENT  Patient Name: [name] [name] MRN: [id number] DOB:18-May-1941, 83 y.o., male Today's Date: 06/06/2024  PCP: Rudy Alyce RAMAN, MD REFERRING PROVIDER:   Pegge Toribio PARAS, PA-C   END OF SESSION:   PT End of Session - 06/06/24 1012     Visit Number 46    Number of Visits 55    Date for Recertification  07/18/24    Progress Note Due on Visit 50    PT Start Time 1015    PT Stop Time 1057    PT Time Calculation (min) 42 min    Equipment Utilized During Treatment Gait belt    Activity Tolerance Patient tolerated treatment well;No increased pain;Other (comment)    Behavior During Therapy WFL for tasks assessed/performed                      Past Medical History:  Diagnosis Date   Arthritis    Depression    Diabetes mellitus without complication (HCC)    Gout    Hyperlipidemia    Hypertension    Ischemic cardiomyopathy    No past surgical history on file. 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   Ischemic cardiomyopathy 06/12/2018   Thrombocytosis 04/16/2016   Adjustment reaction with prolonged depressive reaction 01/22/2014   Visual loss, right eye 07/30/2012   Type 2 diabetes mellitus (HCC) 06/16/2011   Coronary artery disease 06/16/2011   Hypertension associated with diabetes (HCC) 06/16/2011   ONSET DATE: 11/16/2023 REFERRING DIAG: P36.487 (ICD-10-CM) - Acute ischemic left MCA stroke (HCC)  THERAPY DIAG:  Difficulty in walking, not elsewhere classified  Unsteadiness on feet  Muscle weakness (generalized)  Rationale for Evaluation and Treatment: Rehabilitation  SUBJECTIVE:                                                                                                                                                                                              SUBJECTIVE STATEMENT:  Pt reports he is doing well. No changes since last date.   PERTINENT HISTORY: Pt is a pleasant 83 y/o male referred to PT s/p L MCA stroke. Presented 11/16/2023 to [facility] with right facial droop left gaze preference and dysarthria. CT/MRI showed small acute nonhemorrhagic left MCA distribution infarction involving the left frontal lobe. Pt admitted to rehab 11/19/2023 PT/ST/OT. Pt now presents to PT eval in WC. He reports he primarily uses a 4WW at home, but does not ambulate much during  the day (maybe ten minutes total). He reports difficulty with standing up from chairs. He must use grab bars in his bathroom due to difficulty standing up from toilet. He has difficulty with stairs, requires help getting up step to enter his home. He reports no recent falls, but that he does stumble with his 4WW when fatigued. 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  PAIN:  Are you having pain? No- knee continues to feel much improved.   PRECAUTIONS: Fall; Per neurology 01/22/24, goal BP of 130-140/70-80  WEIGHT BEARING RESTRICTIONS: No  FALLS: Has patient fallen in last 6 months? No but does report stumbling with fatigue  LIVING ENVIRONMENT: Lives with: lives with their family son and DIL Lives in: House/apartment, two level home but not using upstairs, stays on first floor Stairs: 1 step to enter home, says no handrails but he has help getting up step Has following equipment at home: Walker - 4 wheeled, shower chair, and Grab bars  PLOF: Independent  PATIENT GOALS: everything: walking, balance, strength   OBJECTIVE:  Note: Objective measures were completed at Evaluation unless otherwise noted.  DIAGNOSTIC FINDINGS: MR brain 11/16/23:  IMPRESSION: 1. Small acute nonhemorrhagic left MCA distribution infarct involving the left frontal lobe. No associated mass effect. 2. Additional punctate subcentimeter  acute ischemic nonhemorrhagic infarct within the contralateral right basal ganglia. 3. Underlying moderately advanced chronic microvascular ischemic disease with a few scattered remote lacunar infarcts as above. 4. Chronic occlusion of the left vertebral artery.    Electronically Signed   By: Morene Hoard M.D.   On: 11/16/2023 23:48  CT ANGIO HEAD NECK 11/16/23  IMPRESSION: No large vessel occlusion or evidence of findings amenable to neurovascular intervention.   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   Multiple intracranial vascular stenoses as above. Focal short segment occlusion of an M2 inferior division branch of the right MCA with reconstitution noted.   Mild-to-moderate stenosis of the V4 segment right vertebral artery.   Emphysema (ICD10-J43.9).     Electronically Signed   By: Donnice Mania M.D.   On: 11/16/2023 15:50  CT HEAD 11/16/23: IMPRESSION: 1. 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 2. Subtle hypoattenuation at the anterior limb of the right internal capsule. 3. 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 4. Age indeterminate infarct in the left lentiform nucleus. 5. Moderate atrophy and white matter disease likely reflects the sequela of chronic microvascular ischemia. 6. No acute hemorrhage or mass lesion.   These results were called by telephone at the time of interpretation on 11/16/2023 at 3:13 pm to provider Dr. Matthews, who verbally acknowledged these results.    Electronically Signed   By: Lonni Necessary M.D.   On: 11/16/2023 15:14                                                                                 TREATMENT DATE 06/06/24 :  TA- To improve functional movements  patterns for everyday tasks  STS from elevated plinth x 10   Sidestepping x 4 laps along plinth table   STS from elevated plinth x 10   Sidestepping x 4 laps along plinth table   STS from elevated plinth x 10   Sidestepping x 4 laps along plinth table   Heel raises standing with UE support x 20   Side step up with R LE 2 x 10 reps on step trainer   Heel raises standing with UE support x 20   Side step up with R LE 2 x 10 reps on step trainer   Heel raises standing with UE support x 20   Side step up with R LE 2 x 10 reps on step trainer   Gait no AD around various obstacles x 50 ft x 2 rounds   NMR: To facilitate reeducation of movement, balance, posture, coordination, and/or proprioception/kinesthetic sense.  RLE on airex left on step anterior 3 x 45 sec  - progressed to ball hand off laterally x 10 e side with standing rest between.   Airex lateral stepping x 10 total without UE support - UE support on a few practice attempts at start -   Pt required occasional rest breaks due fatigue, PT was attentive to when pt appeared to be tired or winded in order to prevent excessive fatigue.        PATIENT EDUCATION: Education details: Pt educated throughout session about proper posture and technique with exercises. Improved exercise technique, movement at target joints, use of target muscles after min to mod verbal, visual, tactile cues.  Person educated: Patient Education method: Explanation, Demonstration, and Verbal cues Education comprehension: verbalized understanding and returned demonstration  HOME EXERCISE PROGRAM: Access Code: [email or web address] URL: [email or web address] Date: 01/30/2024 Prepared by: Massie Dollar  Exercises - Seated March  - 1 x daily - 7 x weekly - 2 sets - 10 reps - Seated Long Arc Quad  - 1 x daily - 7 x weekly - 2 sets - 10 reps - 3 sec hold - Standing Knee Flexion AROM with Chair Support  - 1 x daily - 7 x weekly - 2 sets - 10  reps - Side Stepping with Counter Support  - 1 x daily - 7 x weekly - 3 sets - 10 reps - Sit to Stand with Armchair  - 1 x daily - 7 x weekly - 3 sets - 10 reps - Walking with Counter Support  - 1 x daily - 7 x weekly - 3 sets - 10 reps - Semi-Tandem Balance at [employer] Open  - 1 x daily - 7 x weekly - 2 sets - 4 reps - 15 seconds hold - Standing March with Counter Support  - 1 x daily - 7 x weekly - 3 sets - 10 reps  GOALS: Goals reviewed with patient? Yes  SHORT TERM GOALS: Target date: 01/18/2024 Patient will be independent in home exercise program to improve strength/mobility for better functional independence with ADLs. Baseline: 01/15/2024= Patient able to verbalize and demonstrate his seated HEP without prompting- states compliance.  Goal status: MET  LONG TERM GOALS: Target date: 07/18/2024 1. Patient will increase SIS-16 score by at least 10 points  to demonstrate increased ease with ADLs and quality of life.  Baseline: 54, 7/23: 51  9/25:66 Goal status: MET  2.  Patient (> 78 years old) will complete five times sit to stand test in < 15  seconds indicating an increased LE strength and improved balance. Baseline: 44.2 sec with use of BUE; 01/15/2024= 36.46 sec with BUE Support (Still unable to rise without UE Support and some posterior lean- CGA)  7/14 22.08 sec with UE pushing from arm rest. 39.30 sec with no UE support and min assist to prevent posterior LOB. 04/02/2024= 19.08 sec with min BUE support from arm rest; 04/02/2024= 32.35 sec without UE Support 9/25: 21.98 no UE - unablewithout UE support from standard armchair  Goal status: ONGOING  3.  Patient will increase Berg Balance score by > 6 points to demonstrate decreased fall risk during functional activities Baseline: 34 7/14: 38 04/02/2024= 46 Goal status: MET  4.  Patient will increase 10 meter walk test to >1.11m/s as to improve gait speed for better community ambulation and to reduce fall risk. Baseline: 0.53 m/s  with 4WW; 01/15/2024= 0.58 m/s with 4WW 7/14: 0.38m/s; 04/02/2024= 0.68 m/s 9/25.68 m/s  Goal status: ONGOING  5.  Patient will reduce timed up and go to <11 seconds to reduce fall risk and demonstrate improved transfer/gait ability. Baseline: 33 sec with 4WW; 01/15/2024= 25.42 sec avg with 4WW 7/14: 20.72 sec avg with 4WW 20.19 sec with 4WW  Goal status: ONGOING  6. Patient will increase six minute walk test distance to >1000 for progression to community ambulator and improve gait ability Baseline: 310 feet using a 4WW with CGA and only completes 3:17 sec prior to requesting to sit secondary to fatigue; 01/15/2024= 610 feet in with 4WW 7/14: 646ft with 4WW; 03/21/24: 953ft 79m13sec (achieved a negative split); 04/02/2024= 790 feet using 4WW 9/25:709 ft 10/7: 745 ft with 4WW, good reciprocal gait for most part Goal status: ONGOING  ASSESSMENT:  CLINICAL IMPRESSION:   Patient arrived with good motivation for completion of pt activities. Focussed on R LE hip weakness throughout session. Pt showing improved R hip muscle activation and stability in stance phase of gait without device this date compared to last session. Pt also tolerating decreased rest breaks well this date.  Pt will continue to benefit from skilled physical therapy intervention to address impairments, improve QOL, and attain therapy goals.    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  REHAB POTENTIAL: Good CLINICAL DECISION MAKING: Evolving/moderate complexity  EVALUATION COMPLEXITY:  Moderate  PLAN:  PT FREQUENCY: 1-2x/week  PT DURATION: 12 weeks  PLANNED INTERVENTIONS: 97164- PT Re-evaluation, 97750- Physical Performance Testing, 97110-Therapeutic exercises, 97530- Therapeutic activity, 97112- Neuromuscular re-education, 97535- Self Care, [id number]- Manual therapy, [phone]- Gait training, [phone]- Orthotic Initial, [phone]- Orthotic/Prosthetic subsequent, [phone]- Canalith repositioning, Patient/Family education, Balance training, Stair training, Taping, Joint mobilization, Spinal mobilization, Vestibular training, DME instructions, Wheelchair mobility training, Cryotherapy, and Moist heat  PLAN FOR NEXT SESSION:    Per neurology note 01/22/2024: target BP of 130-140/70-80 Activities to promote increased glute strengthening High intensity gait training with appropriate UE support    Lonni KATHEE Gainer PT ,DPT Physical Therapist- [place]  [facility]

## 2024-06-07 ENCOUNTER — Encounter: Payer: Self-pay | Admitting: Physical Medicine and Rehabilitation

## 2024-06-07 ENCOUNTER — Encounter: Attending: Physical Medicine and Rehabilitation | Admitting: Physical Medicine and Rehabilitation

## 2024-06-07 ENCOUNTER — Other Ambulatory Visit: Payer: Self-pay

## 2024-06-07 VITALS — BP 166/69 | HR 86 | Ht 71.0 in | Wt 176.4 lb

## 2024-06-07 DIAGNOSIS — Z794 Long term (current) use of insulin: Secondary | ICD-10-CM | POA: Diagnosis not present

## 2024-06-07 DIAGNOSIS — I152 Hypertension secondary to endocrine disorders: Secondary | ICD-10-CM | POA: Insufficient documentation

## 2024-06-07 DIAGNOSIS — E1159 Type 2 diabetes mellitus with other circulatory complications: Secondary | ICD-10-CM | POA: Insufficient documentation

## 2024-06-07 DIAGNOSIS — I63512 Cerebral infarction due to unspecified occlusion or stenosis of left middle cerebral artery: Secondary | ICD-10-CM | POA: Insufficient documentation

## 2024-06-07 MED ORDER — AMLODIPINE BESYLATE 5 MG PO TABS
10.0000 mg | ORAL_TABLET | Freq: Every day | ORAL | 3 refills | Status: AC
  Filled 2024-06-07 – 2024-07-29 (×5): qty 90, 45d supply, fill #0

## 2024-06-07 NOTE — Progress Notes (Signed)
 Subjective:    Patient ID: [name] [name], male    DOB: Apr 11, 1941, 83 y.o.   MRN: [id number]  HPI [name] [name] is an 83 year old man who presents for HFU of CVA.  1) CVA -doing well at home -he is benefiting from outpatient therapy -he has graduated from SLP -walking with RW -no falls -walking outside -living with youngest son -continue with therapy  2) Hypophonia: -does not want to follow-up with ENT  3) HTN: BP has been 140s systolic at home  Pain Inventory Average Pain 0 Pain Right Now 0 My pain no pain  LOCATION OF PAIN  no pain     Family History  Problem Relation Age of Onset   Diabetes Sister    Social History   Socioeconomic History   Marital status: Widowed    Spouse name: Not on file   Number of children: Not on file   Years of education: Not on file   Highest education level: Not on file  Occupational History   Not on file  Tobacco Use   Smoking status: Former    Current packs/day: 0.00    Types: Cigarettes    Quit date: 08/08/1989    Years since quitting: 34.8   Smokeless tobacco: Never  Substance and Sexual Activity   Alcohol use: Yes    Alcohol/week: 3.0 standard drinks of alcohol    Types: 3 Shots of liquor per week   Drug use: Never   Sexual activity: Not on file  Other Topics Concern   Not on file  Social History Narrative   Not on file   Social Drivers of Health   Financial Resource Strain: Low Risk  (01/22/2024)   Received from [facility]   Overall Financial Resource Strain (CARDIA)    Difficulty of Paying Living Expenses: Not hard at all  Food Insecurity: No Food Insecurity (01/22/2024)   Received from [facility]   Hunger Vital Sign    Within the past 12 months, you worried that your food would run out before you got the money to buy more.: Never true    Within the past 12 months, the food you bought just didn't last and you didn't have money to get more.: Never true   Transportation Needs: No Transportation Needs (01/22/2024)   Received from [facility] - Transportation    In the past 12 months, has lack of transportation kept you from medical appointments or from getting medications?: No    Lack of Transportation (Non-Medical): No  Physical Activity: Not on file  Stress: Not on file  Social Connections: Patient Unable To Answer (11/16/2023)   Social Connection and Isolation Panel    Frequency of Communication with Friends and Family: Patient unable to answer    Frequency of Social Gatherings with Friends and Family: Patient unable to answer    Attends Religious Services: Patient unable to answer    Active Member of Clubs or Organizations: Patient unable to answer    Attends Banker Meetings: Patient unable to answer    Marital Status: Patient unable to answer   No past surgical history on file. Past Medical History:  Diagnosis Date   Arthritis    Depression    Diabetes mellitus without complication (HCC)    Gout    Hyperlipidemia    Hypertension    Ischemic cardiomyopathy    BP (!) 165/87   Pulse 86   Ht 5'  11 (1.803 m)   Wt 176 lb 6.4 oz (80 kg)   SpO2 98%   BMI 24.60 kg/m   Opioid Risk Score:   Fall Risk Score:  `1  Depression screen [facility] 2/9     06/07/2024   11:29 AM 01/08/2024    2:18 PM 12/16/2020    3:47 PM  Depression screen PHQ 2/9  Decreased Interest 0 0 0  Down, Depressed, Hopeless 0 0 0  PHQ - 2 Score 0 0 0     Review of Systems +hypophpnia     Objective:   Physical Exam Gen: no distress, normal appearing HEENT: oral mucosa pink and moist, NCAT Cardio: Reg rate Chest: normal effort, normal rate of breathing Abd: soft, non-distended Ext: no edema Psych: pleasant, normal affect Skin: intact Neuro: Alert and oriented x3, 5/5 strength in upper and lower extremities, Slow but steady gait with RW, stable 10/31       Assessment & Plan:  1) CVA -continue outpatient  therapy -discussed that his kids have been taking good care of him -continue ambulating with RW  2) Hypophonia: -continue outpatient therapy -discussed ENT referral but he defers  3) HTN: -BP is 165/89 -continue cozaar  Increase amlodipine  to 10mg  daily -Advised checking BP daily at home and logging results to bring into follow-up appointment with PCP and myself. -Reviewed BP meds today.  -Advised regarding healthy foods that can help lower blood pressure and provided with a list: 1) citrus foods- high in vitamins and minerals 2) salmon and other fatty fish - reduces inflammation and oxylipins 3) swiss chard (leafy green)- high level of nitrates 4) pumpkin seeds- one of the best natural sources of magnesium  5) Beans and lentils- high in fiber, magnesium , and potassium 6) Berries- high in flavonoids 7) Amaranth (whole grain, can be cooked similarly to rice and oats)- high in magnesium  and fiber 8) Pistachios- even more effective at reducing BP than other nuts 9) Carrots- high in phenolic compounds that relax blood vessels and reduce inflammation 10) Celery- contain phthalides that relax tissues of arterial walls 11) Tomatoes- can also improve cholesterol and reduce risk of heart disease 12) Broccoli- good source of magnesium , calcium , and potassium 13) Greek yogurt: high in potassium and calcium  14) Herbs and spices: Celery seed, cilantro, saffron, lemongrass, black cumin, ginseng, cinnamon, cardamom, sweet basil, and ginger 15) Chia and flax seeds- also help to lower cholesterol and blood sugar 16) Beets- high levels of nitrates that relax blood vessels  17) spinach and bananas- high in potassium  -Provided lise of supplements that can help with hypertension:  1) magnesium : one high quality brand is Bioptemizers since it contains all 7 types of magnesium , otherwise over the counter magnesium  gluconate 400mg  is a good option 2) B vitamins 3) vitamin D  4) potassium 5) CoQ10 6)  L-arginine 7) Vitamin C 8) Beetroot -Educated that goal BP is 120/80. -Made goal to incorporate some of the above foods into diet.

## 2024-06-07 NOTE — Patient Instructions (Signed)
HTN: -BP is ___today.  -Advised checking BP daily at home and logging results to bring into follow-up appointment with PCP and myself. -Reviewed BP meds today.  -Advised regarding healthy foods that can help lower blood pressure and provided with a list: 1) citrus foods- high in vitamins and minerals 2) salmon and other fatty fish - reduces inflammation and oxylipins 3) swiss chard (leafy green)- high level of nitrates 4) pumpkin seeds- one of the best natural sources of magnesium 5) Beans and lentils- high in fiber, magnesium, and potassium 6) Berries- high in flavonoids 7) Amaranth (whole grain, can be cooked similarly to rice and oats)- high in magnesium and fiber 8) Pistachios- even more effective at reducing BP than other nuts 9) Carrots- high in phenolic compounds that relax blood vessels and reduce inflammation 10) Celery- contain phthalides that relax tissues of arterial walls 11) Tomatoes- can also improve cholesterol and reduce risk of heart disease 12) Broccoli- good source of magnesium, calcium, and potassium 13) Greek yogurt: high in potassium and calcium 14) Herbs and spices: Celery seed, cilantro, saffron, lemongrass, black cumin, ginseng, cinnamon, cardamom, sweet basil, and ginger 15) Chia and flax seeds- also help to lower cholesterol and blood sugar 16) Beets- high levels of nitrates that relax blood vessels  17) spinach and bananas- high in potassium  -Provided lise of supplements that can help with hypertension:  1) magnesium: one high quality brand is Bioptemizers since it contains all 7 types of magnesium, otherwise over the counter magnesium gluconate 400mg is a good option 2) B vitamins 3) vitamin D 4) potassium 5) CoQ10 6) L-arginine 7) Vitamin C 8) Beetroot -Educated that goal BP is 120/80. -Made goal to incorporate some of the above foods into diet.   

## 2024-06-11 ENCOUNTER — Ambulatory Visit: Attending: Physician Assistant | Admitting: Physical Therapy

## 2024-06-11 DIAGNOSIS — R2681 Unsteadiness on feet: Secondary | ICD-10-CM | POA: Insufficient documentation

## 2024-06-11 DIAGNOSIS — R262 Difficulty in walking, not elsewhere classified: Secondary | ICD-10-CM | POA: Insufficient documentation

## 2024-06-11 DIAGNOSIS — R278 Other lack of coordination: Secondary | ICD-10-CM | POA: Diagnosis present

## 2024-06-11 DIAGNOSIS — I63512 Cerebral infarction due to unspecified occlusion or stenosis of left middle cerebral artery: Secondary | ICD-10-CM | POA: Diagnosis present

## 2024-06-11 DIAGNOSIS — M6281 Muscle weakness (generalized): Secondary | ICD-10-CM | POA: Insufficient documentation

## 2024-06-11 NOTE — Therapy (Signed)
 OUTPATIENT PHYSICAL THERAPY TREATMENT  Patient Name: [name] [name] MRN: [id number] DOB:[birth year], 83 y.o., male Today's Date: 06/11/2024  PCP: Rudy Alyce RAMAN, MD REFERRING PROVIDER:   Pegge Toribio PARAS, PA-C   END OF SESSION:   PT End of Session - 06/11/24 1021     Visit Number 47    Number of Visits 55    Date for Recertification  07/18/24    Progress Note Due on Visit 50    PT Start Time 1017    PT Stop Time 1057    PT Time Calculation (min) 40 min    Equipment Utilized During Treatment Gait belt    Activity Tolerance Patient tolerated treatment well;No increased pain;Other (comment)    Behavior During Therapy WFL for tasks assessed/performed                       Past Medical History:  Diagnosis Date   Arthritis    Depression    Diabetes mellitus without complication (HCC)    Gout    Hyperlipidemia    Hypertension    Ischemic cardiomyopathy    No past surgical history on file. 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   Ischemic cardiomyopathy 06/12/2018   Thrombocytosis 04/16/2016   Adjustment reaction with prolonged depressive reaction 01/22/2014   Visual loss, right eye 07/30/2012   Type 2 diabetes mellitus (HCC) 06/16/2011   Coronary artery disease 06/16/2011   Hypertension associated with diabetes (HCC) 06/16/2011   ONSET DATE: 11/16/2023 REFERRING DIAG: P36.487 (ICD-10-CM) - Acute ischemic left MCA stroke (HCC)  THERAPY DIAG:  Difficulty in walking, not elsewhere classified  Unsteadiness on feet  Muscle weakness (generalized)  Rationale for Evaluation and Treatment: Rehabilitation  SUBJECTIVE:                                                                                                                                                                                              SUBJECTIVE STATEMENT:  Pt reports he is doing well. No changes since last date. Had a nice weekend.   PERTINENT HISTORY: Pt is a pleasant 83 y/o male referred to PT s/p L MCA stroke. Presented 11/16/2023 to [facility] with right facial droop left gaze preference and dysarthria. CT/MRI showed small acute nonhemorrhagic left MCA distribution infarction involving the left frontal lobe. Pt admitted to rehab 11/19/2023 PT/ST/OT. Pt now presents to PT eval in WC. He reports he primarily uses a 4WW at home, but  does not ambulate much during the day (maybe ten minutes total). He reports difficulty with standing up from chairs. He must use grab bars in his bathroom due to difficulty standing up from toilet. He has difficulty with stairs, requires help getting up step to enter his home. He reports no recent falls, but that he does stumble with his 4WW when fatigued. 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  PAIN:  Are you having pain? No- knee continues to feel much improved.   PRECAUTIONS: Fall; Per neurology 01/22/24, goal BP of 130-140/70-80  WEIGHT BEARING RESTRICTIONS: No  FALLS: Has patient fallen in last 6 months? No but does report stumbling with fatigue  LIVING ENVIRONMENT: Lives with: lives with their family son and DIL Lives in: House/apartment, two level home but not using upstairs, stays on first floor Stairs: 1 step to enter home, says no handrails but he has help getting up step Has following equipment at home: Walker - 4 wheeled, shower chair, and Grab bars  PLOF: Independent  PATIENT GOALS: everything: walking, balance, strength   OBJECTIVE:  Note: Objective measures were completed at Evaluation unless otherwise noted.  DIAGNOSTIC FINDINGS: MR brain 11/16/23:  IMPRESSION: 1. Small acute nonhemorrhagic left MCA distribution infarct involving the left frontal lobe. No associated mass effect. 2. Additional  punctate subcentimeter acute ischemic nonhemorrhagic infarct within the contralateral right basal ganglia. 3. Underlying moderately advanced chronic microvascular ischemic disease with a few scattered remote lacunar infarcts as above. 4. Chronic occlusion of the left vertebral artery.    Electronically Signed   By: Morene Hoard M.D.   On: 11/16/2023 23:48  CT ANGIO HEAD NECK 11/16/23  IMPRESSION: No large vessel occlusion or evidence of findings amenable to neurovascular intervention.   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   Multiple intracranial vascular stenoses as above. Focal short segment occlusion of an M2 inferior division branch of the right MCA with reconstitution noted.   Mild-to-moderate stenosis of the V4 segment right vertebral artery.   Emphysema (ICD10-J43.9).     Electronically Signed   By: Donnice Mania M.D.   On: 11/16/2023 15:50  CT HEAD 11/16/23: IMPRESSION: 1. 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 2. Subtle hypoattenuation at the anterior limb of the right internal capsule. 3. 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 4. Age indeterminate infarct in the left lentiform nucleus. 5. Moderate atrophy and white matter disease likely reflects the sequela of chronic microvascular ischemia. 6. No acute hemorrhage or mass lesion.   These results were called by telephone at the time of interpretation on 11/16/2023 at 3:13 pm to provider Dr. Matthews, who verbally acknowledged these results.    Electronically Signed   By: Lonni Necessary M.D.   On: 11/16/2023 15:14                                                                                 TREATMENT DATE  06/11/24 :     TA- To improve  functional movements patterns for everyday tasks   Resisted gait x 320 ft with 3# AW donned and using RW   STS from elevated plinth x 10 - cues for preventing back of knees on table   Resisted gait x x 320 ft with 3# AW donned and using RW   STS from elevated plinth x 10 - cues for preventing back of knees on table   NMR: To facilitate reeducation of movement, balance, posture, coordination, and/or proprioception/kinesthetic sense.   Activity Description: SLS tapping forward, lat and post Activity Setting:  sequence Number of Pods:  3 Cycles/Sets:  2 x 2 ea side Duration (Time or Hit Count):  1 min rounds   On airex pad basktetball shot until 10 shots are made ( about 3 min - SPT guarding while PT rebounding and tossing ball back to pt for further perturbation  - rest then move basket further then another round ( 5 min of longer distance target)   TE- To improve strength, endurance, mobility, and function of specific targeted muscle groups or improve joint range of motion or improve muscle flexibility  Seated LAQ with 3# AW x 10 ea with cues for hold ties for quad activation   Seated hip abduction with GTB x 15 with 1 sec hold at end range   Pt required occasional rest breaks due fatigue, PT was attentive to when pt appeared to be tired or winded in order to prevent excessive fatigue.        PATIENT EDUCATION: Education details: Pt educated throughout session about proper posture and technique with exercises. Improved exercise technique, movement at target joints, use of target muscles after min to mod verbal, visual, tactile cues.  Person educated: Patient Education method: Explanation, Demonstration, and Verbal cues Education comprehension: verbalized understanding and returned demonstration  HOME EXERCISE PROGRAM: Access Code: [email or web address] URL: [email or web address] Date: 01/30/2024 Prepared by: Massie Dollar  Exercises - Seated March  - 1 x daily - 7 x  weekly - 2 sets - 10 reps - Seated Long Arc Quad  - 1 x daily - 7 x weekly - 2 sets - 10 reps - 3 sec hold - Standing Knee Flexion AROM with Chair Support  - 1 x daily - 7 x weekly - 2 sets - 10 reps - Side Stepping with Counter Support  - 1 x daily - 7 x weekly - 3 sets - 10 reps - Sit to Stand with Armchair  - 1 x daily - 7 x weekly - 3 sets - 10 reps - Walking with Counter Support  - 1 x daily - 7 x weekly - 3 sets - 10 reps - Semi-Tandem Balance at [employer] Open  - 1 x daily - 7 x weekly - 2 sets - 4 reps - 15 seconds hold - Standing March with Counter Support  - 1 x daily - 7 x weekly - 3 sets - 10 reps  GOALS: Goals reviewed with patient? Yes  SHORT TERM GOALS: Target date: 01/18/2024 Patient will be independent in home exercise program to improve strength/mobility for better functional independence with ADLs. Baseline: 01/15/2024= Patient able to verbalize and demonstrate his seated HEP without prompting- states compliance.  Goal status: MET  LONG TERM GOALS: Target date: 07/18/2024 1. Patient will increase SIS-16 score by at least 10 points  to demonstrate increased ease with ADLs and quality of life.  Baseline: 54, 7/23: 51  9/25:66 Goal  status: MET  2.  Patient (> 77 years old) will complete five times sit to stand test in < 15 seconds indicating an increased LE strength and improved balance. Baseline: 44.2 sec with use of BUE; 01/15/2024= 36.46 sec with BUE Support (Still unable to rise without UE Support and some posterior lean- CGA)  7/14 22.08 sec with UE pushing from arm rest. 39.30 sec with no UE support and min assist to prevent posterior LOB. 04/02/2024= 19.08 sec with min BUE support from arm rest; 04/02/2024= 32.35 sec without UE Support 9/25: 21.98 no UE - unablewithout UE support from standard armchair  Goal status: ONGOING  3.  Patient will increase Berg Balance score by > 6 points to demonstrate decreased fall risk during functional activities Baseline: 34 7/14:  38 04/02/2024= 46 Goal status: MET  4.  Patient will increase 10 meter walk test to >1.68m/s as to improve gait speed for better community ambulation and to reduce fall risk. Baseline: 0.53 m/s with 4WW; 01/15/2024= 0.58 m/s with 4WW 7/14: 0.30m/s; 04/02/2024= 0.68 m/s 9/25.68 m/s  Goal status: ONGOING  5.  Patient will reduce timed up and go to <11 seconds to reduce fall risk and demonstrate improved transfer/gait ability. Baseline: 33 sec with 4WW; 01/15/2024= 25.42 sec avg with 4WW 7/14: 20.72 sec avg with 4WW 20.19 sec with 4WW  Goal status: ONGOING  6. Patient will increase six minute walk test distance to >1000 for progression to community ambulator and improve gait ability Baseline: 310 feet using a 4WW with CGA and only completes 3:17 sec prior to requesting to sit secondary to fatigue; 01/15/2024= 610 feet in with 4WW 7/14: 645ft with 4WW; 03/21/24: 953ft 3m13sec (achieved a negative split); 04/02/2024= 790 feet using 4WW 9/25:709 ft 10/7: 745 ft with 4WW, good reciprocal gait for most part Goal status: ONGOING  ASSESSMENT:  CLINICAL IMPRESSION:   Patient arrived with good motivation for completion of pt activities. Pt did well with stance on unstable surface with dual task balance this date. Pt did struggle with post movement into hip extension in a SLS position with blaze pod activities, will be a good target for future interventions.  Pt will continue to benefit from skilled physical therapy intervention to address impairments, improve QOL, and attain therapy goals.    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  REHAB POTENTIAL: Good CLINICAL DECISION MAKING: Evolving/moderate complexity  EVALUATION COMPLEXITY: Moderate  PLAN:  PT FREQUENCY: 1-2x/week  PT DURATION: 12 weeks  PLANNED INTERVENTIONS: [id number]- PT Re-evaluation, 97750- Physical Performance Testing, 97110-Therapeutic exercises, 97530- Therapeutic activity, [id number]- Neuromuscular re-education, 97535- Self Care, [id number]- Manual therapy, [id number]- Gait training, [phone]- Orthotic Initial, [phone]- Orthotic/Prosthetic subsequent, [phone]- Canalith repositioning, Patient/Family education, Balance training, Stair training, Taping, Joint mobilization, Spinal mobilization, Vestibular training, DME instructions, Wheelchair mobility training, Cryotherapy, and Moist heat  PLAN FOR NEXT SESSION:    Per neurology note 01/22/2024: target BP of 130-140/70-80 Activities to promote increased glute strengthening High intensity gait training with appropriate UE support  Hip strength, unassisted gait in limited spaces, open and slosed chain targeted hip extension and abduction strength.    Lonni KATHEE Gainer PT ,DPT Physical Therapist- [place]  [facility]

## 2024-06-13 ENCOUNTER — Ambulatory Visit: Admitting: Physical Therapy

## 2024-06-18 ENCOUNTER — Ambulatory Visit: Admitting: Physical Therapy

## 2024-06-20 ENCOUNTER — Ambulatory Visit: Admitting: Physical Therapy

## 2024-06-20 DIAGNOSIS — M6281 Muscle weakness (generalized): Secondary | ICD-10-CM

## 2024-06-20 DIAGNOSIS — R2681 Unsteadiness on feet: Secondary | ICD-10-CM

## 2024-06-20 DIAGNOSIS — R262 Difficulty in walking, not elsewhere classified: Secondary | ICD-10-CM

## 2024-06-20 NOTE — Therapy (Signed)
 OUTPATIENT PHYSICAL THERAPY TREATMENT  Patient Name: [name] [name] MRN: [id number] DOB:28-May-1941, 83 y.o., male Today's Date: 06/20/2024  PCP: Rudy Alyce RAMAN, MD REFERRING PROVIDER:   Pegge Toribio PARAS, PA-C   END OF SESSION:   PT End of Session - 06/20/24 1032     Visit Number 48    Number of Visits 55    Date for Recertification  07/18/24    Progress Note Due on Visit 50    PT Start Time 1015    PT Stop Time 1056    PT Time Calculation (min) 41 min    Equipment Utilized During Treatment Gait belt    Activity Tolerance Patient tolerated treatment well;No increased pain;Other (comment)    Behavior During Therapy WFL for tasks assessed/performed                       Past Medical History:  Diagnosis Date   Arthritis    Depression    Diabetes mellitus without complication (HCC)    Gout    Hyperlipidemia    Hypertension    Ischemic cardiomyopathy    No past surgical history on file. 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   Ischemic cardiomyopathy 06/12/2018   Thrombocytosis 04/16/2016   Adjustment reaction with prolonged depressive reaction 01/22/2014   Visual loss, right eye 07/30/2012   Type 2 diabetes mellitus (HCC) 06/16/2011   Coronary artery disease 06/16/2011   Hypertension associated with diabetes (HCC) 06/16/2011   ONSET DATE: 11/16/2023 REFERRING DIAG: P36.487 (ICD-10-CM) - Acute ischemic left MCA stroke (HCC)  THERAPY DIAG:  Difficulty in walking, not elsewhere classified  Unsteadiness on feet  Muscle weakness (generalized)  Rationale for Evaluation and Treatment: Rehabilitation  SUBJECTIVE:                                                                                                                                                                                              SUBJECTIVE STATEMENT:  Pt reports he is doing well. No reported updates since last session.   PERTINENT HISTORY: Pt is a pleasant 83 y/o male referred to PT s/p L MCA stroke. Presented 11/16/2023 to [facility] with right facial droop left gaze preference and dysarthria. CT/MRI showed small acute nonhemorrhagic left MCA distribution infarction involving the left frontal lobe. Pt admitted to rehab 11/19/2023 PT/ST/OT. Pt now presents to PT eval in [place]. He reports he primarily uses a 4WW at home, but does not ambulate  much during the day (maybe ten minutes total). He reports difficulty with standing up from chairs. He must use grab bars in his bathroom due to difficulty standing up from toilet. He has difficulty with stairs, requires help getting up step to enter his home. He reports no recent falls, but that he does stumble with his 4WW when fatigued. 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  PAIN:  Are you having pain? No- knee continues to feel much improved.   PRECAUTIONS: Fall; Per neurology 01/22/24, goal BP of 130-140/70-80  WEIGHT BEARING RESTRICTIONS: No  FALLS: Has patient fallen in last 6 months? No but does report stumbling with fatigue  LIVING ENVIRONMENT: Lives with: lives with their family son and DIL Lives in: House/apartment, two level home but not using upstairs, stays on first floor Stairs: 1 step to enter home, says no handrails but he has help getting up step Has following equipment at home: Walker - 4 wheeled, shower chair, and Grab bars  PLOF: Independent  PATIENT GOALS: everything: walking, balance, strength   OBJECTIVE:  Note: Objective measures were completed at Evaluation unless otherwise noted.  DIAGNOSTIC FINDINGS: MR brain 11/16/23:  IMPRESSION: 1. Small acute nonhemorrhagic left MCA distribution infarct involving the left frontal lobe. No associated mass effect. 2. Additional punctate  subcentimeter acute ischemic nonhemorrhagic infarct within the contralateral right basal ganglia. 3. Underlying moderately advanced chronic microvascular ischemic disease with a few scattered remote lacunar infarcts as above. 4. Chronic occlusion of the left vertebral artery.    Electronically Signed   By: Morene Hoard M.D.   On: 11/16/2023 23:48  CT ANGIO HEAD NECK 11/16/23  IMPRESSION: No large vessel occlusion or evidence of findings amenable to neurovascular intervention.   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   Multiple intracranial vascular stenoses as above. Focal short segment occlusion of an M2 inferior division branch of the right MCA with reconstitution noted.   Mild-to-moderate stenosis of the V4 segment right vertebral artery.   Emphysema (ICD10-J43.9).     Electronically Signed   By: Donnice Mania M.D.   On: 11/16/2023 15:50  CT HEAD 11/16/23: IMPRESSION: 1. 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 2. Subtle hypoattenuation at the anterior limb of the right internal capsule. 3. 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 4. Age indeterminate infarct in the left lentiform nucleus. 5. Moderate atrophy and white matter disease likely reflects the sequela of chronic microvascular ischemia. 6. No acute hemorrhage or mass lesion.   These results were called by telephone at the time of interpretation on 11/16/2023 at 3:13 pm to provider Dr. Matthews, who verbally acknowledged these results.    Electronically Signed   By: Lonni Necessary M.D.   On: 11/16/2023 15:14                                                                                 TREATMENT DATE 06/20/24 :  TA- To improve  functional movements patterns for everyday tasks   Resisted gait x 320 ft with 3# AW donned and using RW   STS from standard chair x 8 - difficult on first few attempts but with encouragement continued to attempt and completed successfully  Resisted gait x 320 ft with 3# AW donned and using RW   STS from standard chair x 8 - high difficulty, improved with practice   Resisted gait x 320 ft with 2.5# AW donned and using RW   Step ups - 6 in x 5 ea LE with intermittent UE support- difficulty with sequencing; cues to improve this  Rest then another round of the above step up activity   Standing heel raises with support x 20 reps   TE- To improve strength, endurance, mobility, and function of specific targeted muscle groups or improve joint range of motion or improve muscle flexibility  Seated march 2 x 10 with 2.5# AW  NMR: To facilitate reeducation of movement, balance, posture, coordination, and/or proprioception/kinesthetic sense.  1 LE on floor other ons tep x 30 sec - easy  Added airex pad for another round x 30 sec, cues for forward gaze   Pt required occasional rest breaks due fatigue, PT was attentive to when pt appeared to be tired or winded in order to prevent excessive fatigue.  Unless otherwise stated, CGA was provided and gait belt donned in order to ensure pt safety       PATIENT EDUCATION: Education details: Pt educated throughout session about proper posture and technique with exercises. Improved exercise technique, movement at target joints, use of target muscles after min to mod verbal, visual, tactile cues.  Person educated: Patient Education method: Explanation, Demonstration, and Verbal cues Education comprehension: verbalized understanding and returned demonstration  HOME EXERCISE PROGRAM: Access Code: [email or web address] URL: [email or web address] Date: 01/30/2024 Prepared by: Massie Dollar  Exercises - Seated March  - 1 x daily - 7 x weekly - 2  sets - 10 reps - Seated Long Arc Quad  - 1 x daily - 7 x weekly - 2 sets - 10 reps - 3 sec hold - Standing Knee Flexion AROM with Chair Support  - 1 x daily - 7 x weekly - 2 sets - 10 reps - Side Stepping with Counter Support  - 1 x daily - 7 x weekly - 3 sets - 10 reps - Sit to Stand with Armchair  - 1 x daily - 7 x weekly - 3 sets - 10 reps - Walking with Counter Support  - 1 x daily - 7 x weekly - 3 sets - 10 reps - Semi-Tandem Balance at [employer] Open  - 1 x daily - 7 x weekly - 2 sets - 4 reps - 15 seconds hold - Standing March with Counter Support  - 1 x daily - 7 x weekly - 3 sets - 10 reps  GOALS: Goals reviewed with patient? Yes  SHORT TERM GOALS: Target date: 01/18/2024 Patient will be independent in home exercise program to improve strength/mobility for better functional independence with ADLs. Baseline: 01/15/2024= Patient able to verbalize and demonstrate his seated HEP without prompting- states compliance.  Goal status: MET  LONG TERM GOALS: Target date: 07/18/2024 1. Patient will increase SIS-16 score by at least 10 points  to demonstrate increased ease with ADLs and quality of life.  Baseline: 54, 7/23: 51  9/25:66 Goal status: MET  2.  Patient (> 38 years old) will complete five  times sit to stand test in < 15 seconds indicating an increased LE strength and improved balance. Baseline: 44.2 sec with use of BUE; 01/15/2024= 36.46 sec with BUE Support (Still unable to rise without UE Support and some posterior lean- CGA)  7/14 22.08 sec with UE pushing from arm rest. 39.30 sec with no UE support and min assist to prevent posterior LOB. 04/02/2024= 19.08 sec with min BUE support from arm rest; 04/02/2024= 32.35 sec without UE Support 9/25: 21.98 no UE - unablewithout UE support from standard armchair  Goal status: ONGOING  3.  Patient will increase Berg Balance score by > 6 points to demonstrate decreased fall risk during functional activities Baseline: 34 7/14:  38 04/02/2024= 46 Goal status: MET  4.  Patient will increase 10 meter walk test to >1.39m/s as to improve gait speed for better community ambulation and to reduce fall risk. Baseline: 0.53 m/s with 4WW; 01/15/2024= 0.58 m/s with 4WW 7/14: 0.11m/s; 04/02/2024= 0.68 m/s 9/25.68 m/s  Goal status: ONGOING  5.  Patient will reduce timed up and go to <11 seconds to reduce fall risk and demonstrate improved transfer/gait ability. Baseline: 33 sec with 4WW; 01/15/2024= 25.42 sec avg with 4WW 7/14: 20.72 sec avg with 4WW 20.19 sec with 4WW  Goal status: ONGOING  6. Patient will increase six minute walk test distance to >1000 for progression to community ambulator and improve gait ability Baseline: 310 feet using a 4WW with CGA and only completes 3:17 sec prior to requesting to sit secondary to fatigue; 01/15/2024= 610 feet in with 4WW 7/14: 681ft with 4WW; 03/21/24: 958ft 37m13sec (achieved a negative split); 04/02/2024= 790 feet using 4WW 9/25:709 ft 10/7: 745 ft with 4WW, good reciprocal gait for most part Goal status: ONGOING  ASSESSMENT:  CLINICAL IMPRESSION:   Patient arrived with good motivation for completion of pt activities. Pt progresses with sit to stand activities from standard chair without UE this date, the first time he has done this in therapy this far and completes multiple sets and reps. Pt still requires a few attempts for completion at times. Pt still working on closed chain strength exercises in session to continue to improve this. Pt will continue to benefit from skilled physical therapy intervention to address impairments, improve QOL, and attain therapy goals.    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  REHAB POTENTIAL: Good CLINICAL DECISION MAKING: Evolving/moderate complexity  EVALUATION COMPLEXITY: Moderate  PLAN:  PT FREQUENCY: 1-2x/week  PT DURATION: 12 weeks  PLANNED INTERVENTIONS: [id number]- PT Re-evaluation, 97750- Physical Performance Testing, 97110-Therapeutic exercises, 97530- Therapeutic activity, [id number]- Neuromuscular re-education, 97535- Self Care, [id number]- Manual therapy, [id number]- Gait training, [phone]- Orthotic Initial, [phone]- Orthotic/Prosthetic subsequent, [phone]- Canalith repositioning, Patient/Family education, Balance training, Stair training, Taping, Joint mobilization, Spinal mobilization, Vestibular training, DME instructions, Wheelchair mobility training, Cryotherapy, and Moist heat  PLAN FOR NEXT SESSION:    Per neurology note 01/22/2024: target BP of 130-140/70-80 Activities to promote increased glute strengthening High intensity gait training with appropriate UE support  Hip strength, unassisted gait in limited spaces, open and slosed chain targeted hip extension and abduction strength.    Lonni KATHEE Gainer PT ,DPT Physical  Therapist- [place]  [facility]

## 2024-06-25 ENCOUNTER — Ambulatory Visit: Admitting: Physical Therapy

## 2024-06-27 ENCOUNTER — Ambulatory Visit: Admitting: Physical Therapy

## 2024-06-27 DIAGNOSIS — M6281 Muscle weakness (generalized): Secondary | ICD-10-CM

## 2024-06-27 DIAGNOSIS — R262 Difficulty in walking, not elsewhere classified: Secondary | ICD-10-CM

## 2024-06-27 DIAGNOSIS — R278 Other lack of coordination: Secondary | ICD-10-CM

## 2024-06-27 DIAGNOSIS — R2681 Unsteadiness on feet: Secondary | ICD-10-CM

## 2024-06-27 NOTE — Therapy (Signed)
 OUTPATIENT PHYSICAL THERAPY TREATMENT  Patient Name: [name] [name] MRN: [id number] DOB:22-Aug-1940, 83 y.o., male Today's Date: 06/27/2024  PCP: Rudy Alyce RAMAN, MD REFERRING PROVIDER:   Pegge Toribio PARAS, PA-C   END OF SESSION:   PT End of Session - 06/27/24 1132     Visit Number 49    Number of Visits 55    Date for Recertification  07/18/24    Progress Note Due on Visit 50    PT Start Time 1013    PT Stop Time 1053    PT Time Calculation (min) 40 min    Equipment Utilized During Treatment Gait belt    Activity Tolerance Patient tolerated treatment well;No increased pain;Other (comment)    Behavior During Therapy WFL for tasks assessed/performed            Past Medical History:  Diagnosis Date   Arthritis    Depression    Diabetes mellitus without complication (HCC)    Gout    Hyperlipidemia    Hypertension    Ischemic cardiomyopathy    No past surgical history on file. 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   Ischemic cardiomyopathy 06/12/2018   Thrombocytosis 04/16/2016   Adjustment reaction with prolonged depressive reaction 01/22/2014   Visual loss, right eye 07/30/2012   Type 2 diabetes mellitus (HCC) 06/16/2011   Coronary artery disease 06/16/2011   Hypertension associated with diabetes (HCC) 06/16/2011   ONSET DATE: 11/16/2023 REFERRING DIAG: P36.487 (ICD-10-CM) - Acute ischemic left MCA stroke (HCC)  THERAPY DIAG:  Difficulty in walking, not elsewhere classified  Unsteadiness on feet  Muscle weakness (generalized)  Other lack of coordination  Rationale for Evaluation and Treatment: Rehabilitation  SUBJECTIVE:                                                                                                                                                                                              SUBJECTIVE STATEMENT:   Pt was sick last session, states he is feeling better today but still fatigued.  PERTINENT HISTORY: Pt is a pleasant 83 y/o male referred to PT s/p L MCA stroke. Presented 11/16/2023 to [facility] with right facial droop left gaze preference and dysarthria. CT/MRI showed small acute nonhemorrhagic left MCA distribution infarction involving the left frontal lobe. Pt admitted to rehab 11/19/2023 PT/ST/OT. Pt now presents to PT eval in WC. He reports he primarily uses a 4WW at home, but does not ambulate much during the day (  maybe ten minutes total). He reports difficulty with standing up from chairs. He must use grab bars in his bathroom due to difficulty standing up from toilet. He has difficulty with stairs, requires help getting up step to enter his home. He reports no recent falls, but that he does stumble with his 4WW when fatigued. 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  PAIN:  Are you having pain? No- knee continues to feel much improved.   PRECAUTIONS: Fall; Per neurology 01/22/24, goal BP of 130-140/70-80  WEIGHT BEARING RESTRICTIONS: No  FALLS: Has patient fallen in last 6 months? No but does report stumbling with fatigue  LIVING ENVIRONMENT: Lives with: lives with their family son and DIL Lives in: House/apartment, two level home but not using upstairs, stays on first floor Stairs: 1 step to enter home, says no handrails but he has help getting up step Has following equipment at home: Walker - 4 wheeled, shower chair, and Grab bars  PLOF: Independent  PATIENT GOALS: everything: walking, balance, strength   OBJECTIVE:  Note: Objective measures were completed at Evaluation unless otherwise noted.  DIAGNOSTIC FINDINGS: MR brain 11/16/23:  IMPRESSION: 1. Small acute nonhemorrhagic left MCA distribution infarct involving the left frontal lobe. No associated mass effect. 2.  Additional punctate subcentimeter acute ischemic nonhemorrhagic infarct within the contralateral right basal ganglia. 3. Underlying moderately advanced chronic microvascular ischemic disease with a few scattered remote lacunar infarcts as above. 4. Chronic occlusion of the left vertebral artery.    Electronically Signed   By: Morene Hoard M.D.   On: 11/16/2023 23:48  CT ANGIO HEAD NECK 11/16/23  IMPRESSION: No large vessel occlusion or evidence of findings amenable to neurovascular intervention.   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   Multiple intracranial vascular stenoses as above. Focal short segment occlusion of an M2 inferior division branch of the right MCA with reconstitution noted.   Mild-to-moderate stenosis of the V4 segment right vertebral artery.   Emphysema (ICD10-J43.9).     Electronically Signed   By: Donnice Mania M.D.   On: 11/16/2023 15:50  CT HEAD 11/16/23: IMPRESSION: 1. 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 2. Subtle hypoattenuation at the anterior limb of the right internal capsule. 3. 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 4. Age indeterminate infarct in the left lentiform nucleus. 5. Moderate atrophy and white matter disease likely reflects the sequela of chronic microvascular ischemia. 6. No acute hemorrhage or mass lesion.   These results were called by telephone at the time of interpretation on 11/16/2023 at 3:13 pm to provider Dr. Matthews, who verbally acknowledged these results.    Electronically Signed   By: Lonni Necessary M.D.   On: 11/16/2023 15:14                                                                                 TREATMENT DATE 06/27/24 :  HR:  84 SpO2:  96% BP:144/79 seated    TA- To improve functional movements patterns for everyday tasks   Gait x 300 ft using RW   STS from standard chair 2 x 8   - Airex provided for height due to fatigue  Seated lateral step over hurdle 2 x 10   -Attempted AW but unable to clear without UE assist  Blaze Pods: Activity Description: Lateral side steps to toe tap on blaze pod Activity Setting:  Random Number of Pods:  4 Cycles/Sets:  3 sets Duration (Time or Hit Count):  1:00 min  - 1st set as described above - 2nd set 2 colors, right foot on red, left foot on blue - 3rd set, 2 colors same setting, included fwd and bwd stepping to blaze pods placed at a distance from pt  Standing feet together on airex x 1 min  -Hands placed on bar at 30 sec to adjust feet  TE- To improve strength, endurance, mobility, and function of specific targeted muscle groups or improve joint range of motion or improve muscle flexibility  LAQ 3# AW 2 x10  Standing heel raises with support 3 x 10 reps   Pt required occasional rest breaks due fatigue, PT was attentive to when pt appeared to be tired or winded in order to prevent excessive fatigue.  Unless otherwise stated, CGA was provided and gait belt donned in order to ensure pt safety   PATIENT EDUCATION: Education details: Pt educated throughout session about proper posture and technique with exercises. Improved exercise technique, movement at target joints, use of target muscles after min to mod verbal, visual, tactile cues.  Person educated: Patient Education method: Explanation, Demonstration, and Verbal cues Education comprehension: verbalized understanding and returned demonstration  HOME EXERCISE PROGRAM: Access Code: [email or web address] URL: [email or web address] Date: 01/30/2024 Prepared by: Massie Dollar  Exercises - Seated March  - 1 x daily - 7 x weekly - 2 sets - 10 reps - Seated Long Arc Quad  - 1 x daily - 7 x weekly - 2 sets -  10 reps - 3 sec hold - Standing Knee Flexion AROM with Chair Support  - 1 x daily - 7 x weekly - 2 sets - 10 reps - Side Stepping with Counter Support  - 1 x daily - 7 x weekly - 3 sets - 10 reps - Sit to Stand with Armchair  - 1 x daily - 7 x weekly - 3 sets - 10 reps - Walking with Counter Support  - 1 x daily - 7 x weekly - 3 sets - 10 reps - Semi-Tandem Balance at [employer] Open  - 1 x daily - 7 x weekly - 2 sets - 4 reps - 15 seconds hold - Standing March with Counter Support  - 1 x daily - 7 x weekly - 3 sets - 10 reps  GOALS: Goals reviewed with patient? Yes  SHORT TERM GOALS: Target date: 01/18/2024 Patient will be independent in home exercise program to improve strength/mobility for better functional independence with ADLs. Baseline: 01/15/2024= Patient able to verbalize and demonstrate his seated HEP without prompting- states compliance.  Goal status: MET  LONG TERM GOALS: Target date: 07/18/2024 1. Patient will increase SIS-16 score by at least 10 points  to demonstrate increased ease with ADLs and quality of life.  Baseline: 54, 7/23: 51  9/25:66 Goal status: MET  2.  Patient (> 60 years old) will complete five times sit to stand test in < 15 seconds indicating an  increased LE strength and improved balance. Baseline: 44.2 sec with use of BUE; 01/15/2024= 36.46 sec with BUE Support (Still unable to rise without UE Support and some posterior lean- CGA)  7/14 22.08 sec with UE pushing from arm rest. 39.30 sec with no UE support and min assist to prevent posterior LOB. 04/02/2024= 19.08 sec with min BUE support from arm rest; 04/02/2024= 32.35 sec without UE Support 9/25: 21.98 no UE - unablewithout UE support from standard armchair  Goal status: ONGOING  3.  Patient will increase Berg Balance score by > 6 points to demonstrate decreased fall risk during functional activities Baseline: 34 7/14: 38 04/02/2024= 46 Goal status: MET  4.  Patient will increase 10 meter walk test to  >1.87m/s as to improve gait speed for better community ambulation and to reduce fall risk. Baseline: 0.53 m/s with 4WW; 01/15/2024= 0.58 m/s with 4WW 7/14: 0.65m/s; 04/02/2024= 0.68 m/s 9/25.68 m/s  Goal status: ONGOING  5.  Patient will reduce timed up and go to <11 seconds to reduce fall risk and demonstrate improved transfer/gait ability. Baseline: 33 sec with 4WW; 01/15/2024= 25.42 sec avg with 4WW 7/14: 20.72 sec avg with 4WW 20.19 sec with 4WW  Goal status: ONGOING  6. Patient will increase six minute walk test distance to >1000 for progression to community ambulator and improve gait ability Baseline: 310 feet using a 4WW with CGA and only completes 3:17 sec prior to requesting to sit secondary to fatigue; 01/15/2024= 610 feet in with 4WW 7/14: 651ft with 4WW; 03/21/24: 935ft 73m13sec (achieved a negative split); 04/02/2024= 790 feet using 4WW 9/25:709 ft 10/7: 745 ft with 4WW, good reciprocal gait for most part Goal status: ONGOING  ASSESSMENT:  CLINICAL IMPRESSION:   Patient arrived with fatigue today after illness that resulted in a cancelled appt earlier this week. Modified this session to include extended rest breaks and decreased resistance and reps. Pts BP, HR, and SpO2 were assessed and WNL for activities today. Pt required an airex under seat to help with success of sit to stands as compared to last session. Pt demonstrated difficulty with single leg balance when tapping on blaze pods, but was able to recover balance on own. Pt was most challenged with balance on airex feet together demonstrating increased sway and UE support after 30 sec. Pt will continue to benefit from skilled physical therapy intervention to address impairments, improve QOL, and attain therapy goals.    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  REHAB POTENTIAL: Good CLINICAL DECISION MAKING: Evolving/moderate complexity  EVALUATION COMPLEXITY: Moderate  PLAN:  PT FREQUENCY: 1-2x/week  PT DURATION: 12 weeks  PLANNED INTERVENTIONS: [id number]- PT Re-evaluation, 97750- Physical Performance Testing, 97110-Therapeutic exercises, 97530- Therapeutic activity, [id number]- Neuromuscular re-education, 97535- Self Care, [id number]- Manual therapy, [phone]- Gait training, [phone]- Orthotic Initial, [id number]- Orthotic/Prosthetic subsequent, [phone]- Canalith repositioning, Patient/Family education, Balance training, Stair training, Taping, Joint mobilization, Spinal mobilization, Vestibular training, DME instructions, Wheelchair mobility training, Cryotherapy, and Moist heat  PLAN FOR NEXT SESSION:    Per neurology note 01/22/2024: target BP of 130-140/70-80 Activities to promote increased glute strengthening High intensity gait training with appropriate UE support  Hip strength, unassisted gait  in limited spaces, open and slosed chain targeted hip extension and abduction strength.  Progress Note   Leonor Rode, SPT

## 2024-07-02 ENCOUNTER — Ambulatory Visit: Admitting: Physical Therapy

## 2024-07-02 DIAGNOSIS — R262 Difficulty in walking, not elsewhere classified: Secondary | ICD-10-CM

## 2024-07-02 DIAGNOSIS — R278 Other lack of coordination: Secondary | ICD-10-CM

## 2024-07-02 DIAGNOSIS — R2681 Unsteadiness on feet: Secondary | ICD-10-CM

## 2024-07-02 DIAGNOSIS — I63512 Cerebral infarction due to unspecified occlusion or stenosis of left middle cerebral artery: Secondary | ICD-10-CM

## 2024-07-02 DIAGNOSIS — M6281 Muscle weakness (generalized): Secondary | ICD-10-CM

## 2024-07-02 NOTE — Therapy (Signed)
 OUTPATIENT PHYSICAL THERAPY TREATMENT/ PROGRESS NOTE  Patient Name: [name] [name] MRN: [id number] DOB:1941-06-09, 83 y.o., male Today's Date: 07/02/2024  Dates of reporting period  05/14/2024   to   07/02/2024   PCP: Rudy Alyce RAMAN, MD REFERRING PROVIDER:   Pegge Toribio PARAS, PA-C   END OF SESSION:   PT End of Session - 07/02/24 1003     Visit Number 50    Number of Visits 55    Date for Recertification  07/18/24    Progress Note Due on Visit 50    PT Start Time 1010    PT Stop Time 1050    PT Time Calculation (min) 40 min    Equipment Utilized During Treatment Gait belt    Activity Tolerance Patient tolerated treatment well;No increased pain    Behavior During Therapy WFL for tasks assessed/performed          Past Medical History:  Diagnosis Date   Arthritis    Depression    Diabetes mellitus without complication (HCC)    Gout    Hyperlipidemia    Hypertension    Ischemic cardiomyopathy    No past surgical history on file. 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   Ischemic cardiomyopathy 06/12/2018   Thrombocytosis 04/16/2016   Adjustment reaction with prolonged depressive reaction 01/22/2014   Visual loss, right eye 07/30/2012   Type 2 diabetes mellitus (HCC) 06/16/2011   Coronary artery disease 06/16/2011   Hypertension associated with diabetes (HCC) 06/16/2011   ONSET DATE: 11/16/2023 REFERRING DIAG: P36.487 (ICD-10-CM) - Acute ischemic left MCA stroke (HCC)  THERAPY DIAG:  Difficulty in walking, not elsewhere classified  Unsteadiness on feet  Muscle weakness (generalized)  Other lack of coordination  Acute ischemic left MCA stroke (HCC)  Rationale for Evaluation and Treatment: Rehabilitation  SUBJECTIVE:                                                                                                                                                                                              SUBJECTIVE STATEMENT:   Pt is feeling better compared to last visit. States he is walking better.   PERTINENT HISTORY: Pt is a pleasant 83 y/o male referred to PT s/p L MCA stroke. Presented 11/16/2023 to [facility] with right facial droop left gaze preference and dysarthria. CT/MRI showed small acute nonhemorrhagic left MCA distribution infarction involving the left frontal lobe. Pt admitted to rehab 11/19/2023 PT/ST/OT. Pt now presents to PT  eval in WC. He reports he primarily uses a 4WW at home, but does not ambulate much during the day (maybe ten minutes total). He reports difficulty with standing up from chairs. He must use grab bars in his bathroom due to difficulty standing up from toilet. He has difficulty with stairs, requires help getting up step to enter his home. He reports no recent falls, but that he does stumble with his 4WW when fatigued. 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  PAIN:  Are you having pain? No- knee continues to feel much improved.   PRECAUTIONS: Fall; Per neurology 01/22/24, goal BP of 130-140/70-80  WEIGHT BEARING RESTRICTIONS: No  FALLS: Has patient fallen in last 6 months? No but does report stumbling with fatigue  LIVING ENVIRONMENT: Lives with: lives with their family son and DIL Lives in: House/apartment, two level home but not using upstairs, stays on first floor Stairs: 1 step to enter home, says no handrails but he has help getting up step Has following equipment at home: Walker - 4 wheeled, shower chair, and Grab bars  PLOF: Independent  PATIENT GOALS: everything: walking, balance, strength   OBJECTIVE:  Note: Objective measures were completed at Evaluation unless otherwise noted.  DIAGNOSTIC FINDINGS: MR brain 11/16/23:  IMPRESSION: 1. Small acute nonhemorrhagic left MCA  distribution infarct involving the left frontal lobe. No associated mass effect. 2. Additional punctate subcentimeter acute ischemic nonhemorrhagic infarct within the contralateral right basal ganglia. 3. Underlying moderately advanced chronic microvascular ischemic disease with a few scattered remote lacunar infarcts as above. 4. Chronic occlusion of the left vertebral artery.    Electronically Signed   By: Morene Hoard M.D.   On: 11/16/2023 23:48  CT ANGIO HEAD NECK 11/16/23  IMPRESSION: No large vessel occlusion or evidence of findings amenable to neurovascular intervention.   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   Multiple intracranial vascular stenoses as above. Focal short segment occlusion of an M2 inferior division branch of the right MCA with reconstitution noted.   Mild-to-moderate stenosis of the V4 segment right vertebral artery.   Emphysema (ICD10-J43.9).     Electronically Signed   By: Donnice Mania M.D.   On: 11/16/2023 15:50  CT HEAD 11/16/23: IMPRESSION: 1. 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 2. Subtle hypoattenuation at the anterior limb of the right internal capsule. 3. 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 4. Age indeterminate infarct in the left lentiform nucleus. 5. Moderate atrophy and white matter disease likely reflects the sequela of chronic microvascular ischemia. 6. No acute hemorrhage or mass lesion.   These results were called by telephone at the time of interpretation on 11/16/2023 at 3:13 pm to provider Dr. Matthews, who verbally acknowledged these results.    Electronically Signed   By: Lonni Necessary M.D.   On: 11/16/2023 15:14  TREATMENT DATE 07/02/24 :  Physical Performance Test or Measurement: a  physical performance test(s) or measurement (eg,  musculoskeletal, functional capacity), with written report,  each 15 mins    Five times Sit to Stand Test (FTSS)  TIME: 33.80 sec  Cut off scores indicative of increased fall risk: >12 sec CVA, >16 sec PD, >13 sec vestibular (ANPTA Core Set of Outcome Measures for Adults with Neurologic Conditions, 2018)   10 Meter Walk Test: Patient instructed to walk 10 meters (32.8 ft) as quickly and as safely as possible at their normal speed Results: 0.60 m/s (16.76 seconds using 4WW)  Cut off scores:   Household Ambulator  < 0.4 m/s  Limited Community Ambulator  0.4 - 0.8 m/s  [employer]  > 0.8 m/s  Increased fall risk  < 1.10m/s  Crossing a Street  >1.63m/s  MCID 0.05 m/s (small), 0.13 m/s (moderate), 0.06 m/s (significant)  (ANPTA Core Set of Outcome Measures for Adults with Neurologic Conditions, 2018)     PT instructed pt in TUG: 24.12 sec ( >13.5 sec indicates increased fall risk)   6 Min Walk Test:  Instructed patient to ambulate as quickly and as safely as possible for 6 minutes using LRAD. Patient was allowed to take standing rest breaks without stopping the test, but if the patient required a sitting rest break the clock would be stopped and the test would be over.  Results: 723 feet using a 4WW. Results indicate that the patient has reduced endurance with ambulation compared to age matched norms.  Age Matched Norms (in meters): 33-82 yo M: 37 F: 26, 25-79 yo M: 55 F: 471, 27-89 yo M: 417 F: 392 MDC: 58.21 meters (190.98 feet) or 50 meters (ANPTA Core Set of Outcome Measures for Adults with Neurologic Conditions, 2018)   TE- To improve strength, endurance, mobility, and function of specific targeted muscle groups or improve joint range of motion or improve muscle flexibility  Standing Hip abduction  2 x10 ea LE  -VC to maintain upright trunk and neutral hip  Lateral step ups with bilateral UE support at stairs 1 x 10  SLS with foot elevated on step 2 x 30 sec ea LE  TA- To improve functional movements patterns for everyday tasks   Lateral facing at bar, toe taps to cone fwd/ side x 5 standing on left LE, x 3 right LE fwd tap  - First rep performed with UE support - Pt right hip fatigued after previous TE only able to complete 3 fwd taps  Pt required occasional rest breaks due fatigue, PT was attentive to when pt appeared to be tired or winded in order to prevent excessive fatigue.  Unless otherwise stated, CGA was provided and gait belt donned in order to ensure pt safety   PATIENT EDUCATION: Education details: Pt educated throughout session about proper posture and technique with exercises. Improved exercise technique, movement at target joints, use of target muscles after min to mod verbal, visual, tactile cues.  Person educated: Patient Education method: Explanation, Demonstration, and Verbal cues Education comprehension: verbalized understanding and returned demonstration  HOME EXERCISE PROGRAM: Access Code: [email or web address] URL: [email or web address] Date: 07/02/2024 Prepared by: Lonni Gainer Exercises - Side Stepping with Counter Support  - 1 x daily - 7 x weekly - 3 sets - 10 reps - Sit to Stand with Armchair  - 1 x daily - 7 x weekly - 3 sets - 10 reps - Walking with Counter Support  - 1 x  daily - 7 x weekly - 3 sets - 10 reps - Semi-Tandem Balance at [employer]  - 1 x daily - 7 x weekly - 2 sets - 4 reps - 15 seconds hold - Standing March with Counter Support  - 1 x daily - 7 x weekly - 3 sets - 10 reps - Lateral Step Up with Counter Support  - 1 x daily - 7 x weekly - 2 sets - 10 reps - Heel Raises with Counter Support  - 1 x daily - 7 x weekly - 3 sets - 15 reps - Standing Hip Abduction with Counter Support  - 1 x daily - 7 x weekly - 2 sets - 10 reps -  One leg on ground other on shelf of low cabinet   - 1 x daily - 7 x weekly - 2 sets - 30 sec hold   GOALS: Goals reviewed with patient? Yes  SHORT TERM GOALS: Target date: 01/18/2024 Patient will be independent in home exercise program to improve strength/mobility for better functional independence with ADLs. Baseline: 01/15/2024= Patient able to verbalize and demonstrate his seated HEP without prompting- states compliance.  Goal status: MET  LONG TERM GOALS: Target date: 07/18/2024 1. Patient will increase SIS-16 score by at least 10 points  to demonstrate increased ease with ADLs and quality of life.  Baseline: 54, 7/23: 51  9/25:66 Goal status: MET  2.  Patient (> 19 years old) will complete five times sit to stand test in < 15 seconds indicating an increased LE strength and improved balance. Baseline: 44.2 sec with use of BUE; 01/15/2024= 36.46 sec with BUE Support (Still unable to rise without UE Support and some posterior lean- CGA)  7/14 22.08 sec with UE pushing from arm rest. 39.30 sec with no UE support and min assist to prevent posterior LOB. 04/02/2024= 19.08 sec with min BUE support from arm rest; 04/02/2024= 32.35 sec without UE Support 9/25: 21.98 no UE - unablewithout UE support from standard armchair, 11/25: 33.80 without UE support Goal status: ONGOING  3.  Patient will increase Berg Balance score by > 6 points to demonstrate decreased fall risk during functional activities Baseline: 34 7/14: 38 04/02/2024= 46 Goal status: MET  4.  Patient will increase 10 meter walk test to >1.9m/s as to improve gait speed for better community ambulation and to reduce fall risk. Baseline: 0.53 m/s with 4WW; 01/15/2024= 0.58 m/s with 4WW 7/14: 0.19m/s; 04/02/2024= 0.68 m/s 9/25.68 m/s, 11/25 0.60 m/s Goal status: ONGOING  5.  Patient will reduce timed up and go to <11 seconds to reduce fall risk and demonstrate improved transfer/gait ability. Baseline: 33 sec with 4WW; 01/15/2024= 25.42 sec avg  with 4WW 7/14: 20.72 sec avg with 4WW 20.19 sec with 4WW, 11/25 24.12 sec Goal status: ONGOING  6. Patient will increase six minute walk test distance to >1000 for progression to community ambulator and improve gait ability Baseline: 310 feet using a 4WW with CGA and only completes 3:17 sec prior to requesting to sit secondary to fatigue; 01/15/2024= 610 feet in with 4WW 7/14: 69ft with 4WW; 03/21/24: 956ft 25m13sec (achieved a negative split); 04/02/2024= 790 feet using 4WW 9/25:709 ft 10/7: 745 ft with 4WW, good reciprocal gait for most part, 11/25 723 ft Goal status: ONGOING  ASSESSMENT:  CLINICAL IMPRESSION:   Pt arrived with good motivation for completion of PT activities. This session was a progress note. 5xSTS, , , and TUG were completed. 5xSTS time was  increased as compared to last progress update, but pt can now complete without UE support showing increased strength and power needed for transfers. TUG, , and were decreased overall indicating further room for improvement. Pt believes hip weakness is contributing to decreased endurance for walking, stating it gets tired easily. Pt instructed on updated HEP to target hip musculature. Pt encouraged to complete HEP as prescribed to work toward above goals when not at therapy. Patient's condition has the potential to improve in response to therapy. Maximum improvement is yet to be obtained. The anticipated improvement is attainable and reasonable in a generally predictable time.   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  REHAB POTENTIAL: Good CLINICAL DECISION MAKING: Evolving/moderate complexity  EVALUATION COMPLEXITY: Moderate  PLAN:  PT FREQUENCY: 1-2x/week  PT DURATION: 12 weeks  PLANNED INTERVENTIONS: [id number]- PT Re-evaluation, 97750- Physical Performance Testing, 97110-Therapeutic exercises, 97530- Therapeutic activity, 97112- Neuromuscular re-education, 97535- Self Care, [id number]- Manual therapy, [phone]- Gait training, [phone]- Orthotic Initial, [phone]- Orthotic/Prosthetic subsequent, [phone]- Canalith repositioning, Patient/Family education, Balance training, Stair training, Taping, Joint mobilization, Spinal mobilization, Vestibular training, DME instructions, Wheelchair mobility training, Cryotherapy, and Moist heat  PLAN FOR NEXT SESSION:    Per neurology note 01/22/2024: target BP of 130-140/70-80 Activities to promote increased glute/ hip strengthening High intensity gait training with appropriate UE support  Activities that encourage single leg support    Leonor Rode, SPT

## 2024-07-09 ENCOUNTER — Ambulatory Visit: Attending: Physician Assistant | Admitting: Physical Therapy

## 2024-07-09 DIAGNOSIS — R2681 Unsteadiness on feet: Secondary | ICD-10-CM | POA: Insufficient documentation

## 2024-07-09 DIAGNOSIS — I63512 Cerebral infarction due to unspecified occlusion or stenosis of left middle cerebral artery: Secondary | ICD-10-CM | POA: Insufficient documentation

## 2024-07-09 DIAGNOSIS — M6281 Muscle weakness (generalized): Secondary | ICD-10-CM | POA: Diagnosis present

## 2024-07-09 DIAGNOSIS — R262 Difficulty in walking, not elsewhere classified: Secondary | ICD-10-CM | POA: Insufficient documentation

## 2024-07-09 DIAGNOSIS — R278 Other lack of coordination: Secondary | ICD-10-CM | POA: Insufficient documentation

## 2024-07-09 NOTE — Therapy (Signed)
 OUTPATIENT PHYSICAL THERAPY TREATMENT  Patient Name: [name] [name] MRN: [id number] DOB:1940/09/30, 83 y.o., male Today's Date: 07/09/2024  PCP: Rudy Alyce RAMAN, MD REFERRING PROVIDER:   Pegge Toribio PARAS, PA-C   END OF SESSION:   PT End of Session - 07/09/24 1018     Visit Number 51    Number of Visits 55    Date for Recertification  07/18/24    PT Start Time 1018    PT Stop Time 1100    PT Time Calculation (min) 42 min    Equipment Utilized During Treatment Gait belt    Activity Tolerance Patient tolerated treatment well;No increased pain    Behavior During Therapy WFL for tasks assessed/performed           Past Medical History:  Diagnosis Date   Arthritis    Depression    Diabetes mellitus without complication (HCC)    Gout    Hyperlipidemia    Hypertension    Ischemic cardiomyopathy    No past surgical history on file. 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   Ischemic cardiomyopathy 06/12/2018   Thrombocytosis 04/16/2016   Adjustment reaction with prolonged depressive reaction 01/22/2014   Visual loss, right eye 07/30/2012   Type 2 diabetes mellitus (HCC) 06/16/2011   Coronary artery disease 06/16/2011   Hypertension associated with diabetes (HCC) 06/16/2011   ONSET DATE: 11/16/2023 REFERRING DIAG: P36.487 (ICD-10-CM) - Acute ischemic left MCA stroke (HCC)  THERAPY DIAG:  No diagnosis found.  Rationale for Evaluation and Treatment: Rehabilitation  SUBJECTIVE:                                                                                                                                                                                             SUBJECTIVE STATEMENT:   Pt states he is feeling good today. No changes since last time. Still reports issues with walking and the cold making his bones ache.  No reported pain today.  PERTINENT HISTORY: Pt is a pleasant 83 y/o male referred to PT s/p L MCA stroke. Presented 11/16/2023 to [facility] with right facial droop left gaze preference and dysarthria. CT/MRI showed small acute nonhemorrhagic left MCA distribution infarction involving the left frontal lobe. Pt admitted to rehab 11/19/2023 PT/ST/OT. Pt now presents to PT eval in WC. He reports he primarily uses a 4WW at home, but does not ambulate much during the day (maybe ten minutes total). He reports difficulty with standing up from chairs. He  must use grab bars in his bathroom due to difficulty standing up from toilet. He has difficulty with stairs, requires help getting up step to enter his home. He reports no recent falls, but that he does stumble with his 4WW when fatigued. 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  PAIN:  Are you having pain? No   PRECAUTIONS: Fall; Per neurology 01/22/24, goal BP of 130-140/70-80  WEIGHT BEARING RESTRICTIONS: No  FALLS: Has patient fallen in last 6 months? No but does report stumbling with fatigue  LIVING ENVIRONMENT: Lives with: lives with their family son and DIL Lives in: House/apartment, two level home but not using upstairs, stays on first floor Stairs: 1 step to enter home, says no handrails but he has help getting up step Has following equipment at home: Walker - 4 wheeled, shower chair, and Grab bars  PLOF: Independent  PATIENT GOALS: everything: walking, balance, strength   OBJECTIVE:  Note: Objective measures were completed at Evaluation unless otherwise noted.  DIAGNOSTIC FINDINGS: MR brain 11/16/23:  IMPRESSION: 1. Small acute nonhemorrhagic left MCA distribution infarct involving the left frontal lobe. No associated mass effect. 2. Additional punctate subcentimeter acute ischemic nonhemorrhagic infarct within the contralateral right basal ganglia. 3. Underlying  moderately advanced chronic microvascular ischemic disease with a few scattered remote lacunar infarcts as above. 4. Chronic occlusion of the left vertebral artery.    Electronically Signed   By: Morene Hoard M.D.   On: 11/16/2023 23:48  CT ANGIO HEAD NECK 11/16/23  IMPRESSION: No large vessel occlusion or evidence of findings amenable to neurovascular intervention.   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   Multiple intracranial vascular stenoses as above. Focal short segment occlusion of an M2 inferior division branch of the right MCA with reconstitution noted.   Mild-to-moderate stenosis of the V4 segment right vertebral artery.   Emphysema (ICD10-J43.9).     Electronically Signed   By: Donnice Mania M.D.   On: 11/16/2023 15:50  CT HEAD 11/16/23: IMPRESSION: 1. 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 2. Subtle hypoattenuation at the anterior limb of the right internal capsule. 3. 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 4. Age indeterminate infarct in the left lentiform nucleus. 5. Moderate atrophy and white matter disease likely reflects the sequela of chronic microvascular ischemia. 6. No acute hemorrhage or mass lesion.   These results were called by telephone at the time of interpretation on 11/16/2023 at 3:13 pm to provider Dr. Matthews, who verbally acknowledged these results.    Electronically Signed   By: Lonni Necessary M.D.   On: 11/16/2023 15:14                                                                                 TREATMENT DATE 07/09/24 :  **Vitals seated: 138/69, HR 89**  Glute/hip mm strengthening circuit (2 rounds) standing hip flexion w/ 2# AW on B LE x20ea heel  raises w/  2# AW on B LE x15 standing hip abduction w/ 2# AW on B LE, x20ea Standing hip extension w/ 2# AW on B LE, x20ea Sit<>stand w/ airex pad on seat for increased height, 3x5, arms crossed on chest, occasional min assist from SPT for full stand position Side step-ups/step-downs onto 4in step w/  2# AW on B LE, 2x8 RW ambulation with AW 2# AW on B LE, 2 laps, 354ft   *Unless otherwise stated, CGA was provided and gait belt donned in order to ensure pt safety*   PATIENT EDUCATION: Education details: Pt educated throughout session about proper posture and technique with exercises. Improved exercise technique, movement at target joints, use of target muscles after min to mod verbal, visual, tactile cues.  Person educated: Patient Education method: Explanation, Demonstration, and Verbal cues Education comprehension: verbalized understanding and returned demonstration  HOME EXERCISE PROGRAM: Access Code: [email or web address] URL: [email or web address] Date: 07/02/2024 Prepared by: Lonni Gainer Exercises - Side Stepping with Counter Support  - 1 x daily - 7 x weekly - 3 sets - 10 reps - Sit to Stand with Armchair  - 1 x daily - 7 x weekly - 3 sets - 10 reps - Walking with Counter Support  - 1 x daily - 7 x weekly - 3 sets - 10 reps - Semi-Tandem Balance at [employer] Open  - 1 x daily - 7 x weekly - 2 sets - 4 reps - 15 seconds hold - Standing March with Counter Support  - 1 x daily - 7 x weekly - 3 sets - 10 reps - Lateral Step Up with Counter Support  - 1 x daily - 7 x weekly - 2 sets - 10 reps - Heel Raises with Counter Support  - 1 x daily - 7 x weekly - 3 sets - 15 reps - Standing Hip Abduction with Counter Support  - 1 x daily - 7 x weekly - 2 sets - 10 reps - One leg on ground other on shelf of low cabinet   - 1 x daily - 7 x weekly - 2 sets - 30 sec hold   GOALS: Goals reviewed with patient? Yes  SHORT TERM GOALS: Target date: 01/18/2024 Patient will be independent in home exercise  program to improve strength/mobility for better functional independence with ADLs. Baseline: 01/15/2024= Patient able to verbalize and demonstrate his seated HEP without prompting- states compliance.  Goal status: MET  LONG TERM GOALS: Target date: 07/18/2024 1. Patient will increase SIS-16 score by at least 10 points  to demonstrate increased ease with ADLs and quality of life.  Baseline: 54, 7/23: 51  9/25:66 Goal status: MET  2.  Patient (> 59 years old) will complete five times sit to stand test in < 15 seconds indicating an increased LE strength and improved balance. Baseline: 44.2 sec with use of BUE; 01/15/2024= 36.46 sec with BUE Support (Still unable to rise without UE Support and some posterior lean- CGA)  7/14 22.08 sec with UE pushing from arm rest. 39.30 sec with no UE support and min assist to prevent posterior LOB. 04/02/2024= 19.08 sec with min BUE support from arm rest; 04/02/2024= 32.35 sec without UE Support 9/25: 21.98 no UE - unablewithout UE support from standard armchair, 11/25: 33.80 without UE support Goal status: ONGOING  3.  Patient will increase Berg Balance score by > 6 points to demonstrate decreased fall risk during functional activities Baseline: 34 7/14: 38 04/02/2024= 46 Goal status: MET  4.  Patient will increase 10 meter walk test to >1.58m/s as to improve gait speed for better community ambulation and to reduce fall risk. Baseline: 0.53 m/s with 4WW; 01/15/2024= 0.58 m/s with 4WW 7/14: 0.25m/s; 04/02/2024= 0.68 m/s 9/25.68 m/s, 11/25 0.60 m/s Goal status: ONGOING  5.  Patient will reduce timed up and go to <11 seconds to reduce fall risk and demonstrate improved transfer/gait ability. Baseline: 33 sec with 4WW; 01/15/2024= 25.42 sec avg with 4WW 7/14: 20.72 sec avg with 4WW 20.19 sec with 4WW, 11/25 24.12 sec Goal status: ONGOING  6. Patient will increase six minute walk test distance to >1000 for progression to community ambulator and improve gait  ability Baseline: 310 feet using a 4WW with CGA and only completes 3:17 sec prior to requesting to sit secondary to fatigue; 01/15/2024= 610 feet in with 4WW 7/14: 651ft with 4WW; 03/21/24: 927ft 30m13sec (achieved a negative split); 04/02/2024= 790 feet using 4WW 9/25:709 ft 10/7: 745 ft with 4WW, good reciprocal gait for most part, 11/25 723 ft Goal status: ONGOING  ASSESSMENT:  CLINICAL IMPRESSION: Session today focusing on increasing glute/hip musculature for improved ambulation endurance and decrease risk of falls by increasing stability of LE. Vitals taken today show within recommended target window. Pt tolerated entirety of session today while 2# weighted donned to B ankles. Pt noted most difficulty with standing hip ext, needing cuing for proper form and keeping knee in extension throughout movement. Pt required short seated rest breaks occasionally due to minor SoB and pt reported fatigue. Pt showed good form and decreased lateral lean during standing hip abduction exercise. Pt able to perform sit<>stand exercise today with no UE support with arms crossed across chest using blue foam pad for additional seat height, with SPT min assist for 2 repetitions for increased forward lean for full stand. Pt reported he thought it was a great and enjoyable session while exiting building. Pt will continue to benefit from skilled therapy to address remaining deficits in order to improve overall QoL and return to PLOF.    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   REHAB POTENTIAL: Good CLINICAL DECISION MAKING: Evolving/moderate complexity  EVALUATION COMPLEXITY: Moderate  PLAN:  PT FREQUENCY: 1-2x/week  PT DURATION: 12 weeks  PLANNED INTERVENTIONS: 97164- PT Re-evaluation, 97750- Physical Performance Testing, 97110-Therapeutic exercises, 97530- Therapeutic activity, 97112- Neuromuscular re-education, 97535- Self Care, [id number]- Manual therapy, [phone]- Gait training, [phone]- Orthotic Initial, [phone]- Orthotic/Prosthetic subsequent, [phone]- Canalith repositioning, Patient/Family education, Balance training, Stair training, Taping, Joint mobilization, Spinal mobilization, Vestibular training, DME instructions, Wheelchair mobility training, Cryotherapy, and Moist heat  PLAN FOR NEXT SESSION:    *Per neurology note 01/22/2024: target BP of 130-140/70-80* Activities to promote increased glute/ hip strengthening High intensity gait training with appropriate UE support  Activities that encourage single leg support   Renna Helling, SPT

## 2024-07-11 ENCOUNTER — Ambulatory Visit: Admitting: Physical Therapy

## 2024-07-16 ENCOUNTER — Ambulatory Visit: Admitting: Physical Therapy

## 2024-07-16 ENCOUNTER — Telehealth: Payer: Self-pay | Admitting: Physical Therapy

## 2024-07-16 NOTE — Telephone Encounter (Signed)
 Attempted to contact patient regarding missed appointment on 12/9. Unable to leave voicemail due to mailbox setup.   Lonni Gainer PT, DPT

## 2024-07-16 NOTE — Therapy (Incomplete)
 OUTPATIENT PHYSICAL THERAPY TREATMENT  Patient Name: [name] [name] MRN: [id number] DOB:01-09-1941, 83 y.o., male Today's Date: 07/16/2024  PCP: Rudy Alyce RAMAN, MD REFERRING PROVIDER:   Pegge Toribio PARAS, PA-C   END OF SESSION:      Past Medical History:  Diagnosis Date   Arthritis    Depression    Diabetes mellitus without complication (HCC)    Gout    Hyperlipidemia    Hypertension    Ischemic cardiomyopathy    No past surgical history on file. 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   Ischemic cardiomyopathy 06/12/2018   Thrombocytosis 04/16/2016   Adjustment reaction with prolonged depressive reaction 01/22/2014   Visual loss, right eye 07/30/2012   Type 2 diabetes mellitus (HCC) 06/16/2011   Coronary artery disease 06/16/2011   Hypertension associated with diabetes (HCC) 06/16/2011   ONSET DATE: 11/16/2023 REFERRING DIAG: P36.487 (ICD-10-CM) - Acute ischemic left MCA stroke (HCC)  THERAPY DIAG:  No diagnosis found.  Rationale for Evaluation and Treatment: Rehabilitation  SUBJECTIVE:                                                                                                                                                                                             SUBJECTIVE STATEMENT:   Pt states he is feeling good today. No changes since last time. Still reports issues with walking and the cold making his bones ache. No reported pain today.  PERTINENT HISTORY: Pt is a pleasant 83 y/o male referred to PT s/p L MCA stroke. Present[facility]/16/20[facility] to [facility] with right facial droop left gaze preference and dysarthria. CT/MRI showed small acute nonhemorrhagic left MCA distribution infarction involving the left frontal lobe. Pt admitted to rehab 11/19/2023 PT/ST/OT. Pt now presents to PT eval in [place]. He reports he  primarily uses a 4WW at home, but does not ambulate much during the day (maybe ten minutes total). He reports difficulty with standing up from chairs. He must use grab bars in his bathroom due to difficulty standing up from toilet. He has difficulty with stairs, requires help getting up step to enter his home. He reports no recent falls, but that he does stumble with his 4WW when fatigued. 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  PAIN:  Are you having pain? No   PRECAUTIONS: Fall; Per neurology 01/22/24, goal BP of 130-140/70-80  WEIGHT BEARING RESTRICTIONS: No  FALLS: Has patient fallen in  last 6 months? No but does report stumbling with fatigue  LIVING ENVIRONMENT: Lives with: lives with their family son and DIL Lives in: House/apartment, two level home but not using upstairs, stays on first floor Stairs: 1 step to enter home, says no handrails but he has help getting up step Has following equipment at home: Walker - 4 wheeled, shower chair, and Grab bars  PLOF: Independent  PATIENT GOALS: everything: walking, balance, strength   OBJECTIVE:  Note: Objective measures were completed at Evaluation unless otherwise noted.  DIAGNOSTIC FINDINGS: MR brain 11/16/23:  IMPRESSION: 1. Small acute nonhemorrhagic left MCA distribution infarct involving the left frontal lobe. No associated mass effect. 2. Additional punctate subcentimeter acute ischemic nonhemorrhagic infarct within the contralateral right basal ganglia. 3. Underlying moderately advanced chronic microvascular ischemic disease with a few scattered remote lacunar infarcts as above. 4. Chronic occlusion of the left vertebral artery.    Electronically Signed   By: Morene Hoard M.D.   On: 11/16/2023 23:48  CT ANGIO HEAD NECK 11/16/23  IMPRESSION: No large vessel occlusion or evidence of findings amenable to neurovascular intervention.    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   Multiple intracranial vascular stenoses as above. Focal short segment occlusion of an M2 inferior division branch of the right MCA with reconstitution noted.   Mild-to-moderate stenosis of the V4 segment right vertebral artery.   Emphysema (ICD10-J43.9).     Electronically Signed   By: Donnice Mania M.D.   On: 11/16/2023 15:50  CT HEAD 11/16/23: IMPRESSION: 1. 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 2. Subtle hypoattenuation at the anterior limb of the right internal capsule. 3. 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 4. Age indeterminate infarct in the left lentiform nucleus. 5. Moderate atrophy and white matter disease likely reflects the sequela of chronic microvascular ischemia. 6. No acute hemorrhage or mass lesion.   These results were called by telephone at the time of interpretation on 11/16/2023 at 3:13 pm to provider Dr. Matthews, who verbally acknowledged these results.    Electronically Signed   By: Lonni Necessary M.D.   On: 11/16/2023 15:14                                                                                 TREATMENT DATE 07/16/24 :  **Vitals seated: 138/69, HR 89**  Glute/hip mm strengthening circuit (2 rounds) standing hip flexion w/ 2# AW on B LE x20ea heel raises w/ 2# AW on B LE x15 standing hip abduction w/ 2# AW on B LE, x20ea Standing hip extension w/ 2# AW on B LE, x20ea Sit<>stand w/ airex pad on seat for increased height, 3x5, arms crossed on chest, occasional min assist from SPT for full stand position Side step-ups/step-downs onto 4in step w/  2# AW on B LE, 2x8 RW ambulation with AW 2# AW on B LE, 2 laps, 326ft   *Unless otherwise  stated, CGA was provided and gait belt donned  in order to ensure pt safety*   PATIENT EDUCATION: Education details: Pt educated throughout session about proper posture and technique with exercises. Improved exercise technique, movement at target joints, use of target muscles after min to mod verbal, visual, tactile cues.  Person educated: Patient Education method: Explanation, Demonstration, and Verbal cues Education comprehension: verbalized understanding and returned demonstration  HOME EXERCISE PROGRAM: Access Code: [email or web address] URL: [email or web address] Date: 07/02/2024 Prepared by: Lonni Gainer Exercises - Side Stepping with Counter Support  - 1 x daily - 7 x weekly - 3 sets - 10 reps - Sit to Stand with Armchair  - 1 x daily - 7 x weekly - 3 sets - 10 reps - Walking with Counter Support  - 1 x daily - 7 x weekly - 3 sets - 10 reps - Semi-Tandem Balance at [employer] Open  - 1 x daily - 7 x weekly - 2 sets - 4 reps - 15 seconds hold - Standing March with Counter Support  - 1 x daily - 7 x weekly - 3 sets - 10 reps - Lateral Step Up with Counter Support  - 1 x daily - 7 x weekly - 2 sets - 10 reps - Heel Raises with Counter Support  - 1 x daily - 7 x weekly - 3 sets - 15 reps - Standing Hip Abduction with Counter Support  - 1 x daily - 7 x weekly - 2 sets - 10 reps - One leg on ground other on shelf of low cabinet   - 1 x daily - 7 x weekly - 2 sets - 30 sec hold   GOALS: Goals reviewed with patient? Yes  SHORT TERM GOALS: Target date: 01/18/2024 Patient will be independent in home exercise program to improve strength/mobility for better functional independence with ADLs. Baseline: 01/15/2024= Patient able to verbalize and demonstrate his seated HEP without prompting- states compliance.  Goal status: MET  LONG TERM GOALS: Target date: 07/18/2024 1. Patient will increase SIS-16 score by at least 10 points  to demonstrate increased ease with ADLs and quality of life.   Baseline: 54, 7/23: 51  9/25:66 Goal status: MET  2.  Patient (> 50 years old) will complete five times sit to stand test in < 15 seconds indicating an increased LE strength and improved balance. Baseline: 44.2 sec with use of BUE; 01/15/2024= 36.46 sec with BUE Support (Still unable to rise without UE Support and some posterior lean- CGA)  7/14 22.08 sec with UE pushing from arm rest. 39.30 sec with no UE support and min assist to prevent posterior LOB. 04/02/2024= 19.08 sec with min BUE support from arm rest; 04/02/2024= 32.35 sec without UE Support 9/25: 21.98 no UE - unablewithout UE support from standard armchair, 11/25: 33.80 without UE support Goal status: ONGOING  3.  Patient will increase Berg Balance score by > 6 points to demonstrate decreased fall risk during functional activities Baseline: 34 7/14: 38 04/02/2024= 46 Goal status: MET  4.  Patient will increase 10 meter walk test to >1.52m/s as to improve gait speed for better community ambulation and to reduce fall risk. Baseline: 0.53 m/s with 4WW; 01/15/2024= 0.58 m/s with 4WW 7/14: 0.26m/s; 04/02/2024= 0.68 m/s 9/25.68 m/s, 11/25 0.60 m/s Goal status: ONGOING  5.  Patient will reduce timed up and go to <11 seconds to reduce fall risk and demonstrate improved transfer/gait ability. Baseline: 33 sec with 4WW; 01/15/2024= 25.42 sec avg with 4WW 7/14: 20.72 sec avg with 4WW 20.19  sec with 4WW, 11/25 24.12 sec Goal status: ONGOING  6. Patient will increase six minute walk test distance to >1000 for progression to community ambulator and improve gait ability Baseline: 310 feet using a 4WW with CGA and only completes 3:17 sec prior to requesting to sit secondary to fatigue; 01/15/2024= 610 feet in with 4WW 7/14: 625ft with 4WW; 03/21/24: 921ft 14m13sec (achieved a negative split); 04/02/2024= 790 feet using 4WW 9/25:709 ft 10/7: 745 ft with 4WW, good reciprocal gait for most part, 11/25 723 ft Goal status: ONGOING  ASSESSMENT:  CLINICAL  IMPRESSION: Session today focusing on increasing glute/hip musculature for improved ambulation endurance and decrease risk of falls by increasing stability of LE. Vitals taken today show within recommended target window. Pt tolerated entirety of session today while 2# weighted donned to B ankles. Pt noted most difficulty with standing hip ext, needing cuing for proper form and keeping knee in extension throughout movement. Pt required short seated rest breaks occasionally due to minor SoB and pt reported fatigue. Pt showed good form and decreased lateral lean during standing hip abduction exercise. Pt able to perform sit<>stand exercise today with no UE support with arms crossed across chest using blue foam pad for additional seat height, with SPT min assist for 2 repetitions for increased forward lean for full stand. Pt reported he thought it was a great and enjoyable session while exiting building. Pt will continue to benefit from skilled therapy to address remaining deficits in order to improve overall QoL and return to PLOF.    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  REHAB POTENTIAL: Good CLINICAL DECISION MAKING: Evolving/moderate complexity  EVALUATION COMPLEXITY: Moderate  PLAN:  PT FREQUENCY: 1-2x/week  PT DURATION: 12 weeks  PLANNED INTERVENTIONS: 97164- PT Re-evaluation, 97750- Physical Performance Testing, 97110-Therapeutic exercises, 97530- Therapeutic activity, 97112- Neuromuscular re-education, 97535- Self Care, [id number]- Manual therapy,  [phone]- Gait training, [phone]- Orthotic Initial, [phone]- Orthotic/Prosthetic subsequent, [phone]- Canalith repositioning, Patient/Family education, Balance training, Stair training, Taping, Joint mobilization, Spinal mobilization, Vestibular training, DME instructions, Wheelchair mobility training, Cryotherapy, and Moist heat  PLAN FOR NEXT SESSION:    *Per neurology note 01/22/2024: target BP of 130-140/70-80* Activities to promote increased glute/ hip strengthening High intensity gait training with appropriate UE support  Activities that encourage single leg support   Renna Helling, SPT

## 2024-07-18 ENCOUNTER — Other Ambulatory Visit: Payer: Self-pay

## 2024-07-18 ENCOUNTER — Ambulatory Visit: Admitting: Physical Therapy

## 2024-07-18 MED FILL — Rosuvastatin Calcium Tab 20 MG: ORAL | 90 days supply | Qty: 90 | Fill #1 | Status: CN

## 2024-07-18 MED FILL — Clopidogrel Bisulfate Tab 75 MG (Base Equiv): ORAL | 90 days supply | Qty: 90 | Fill #1 | Status: CN

## 2024-07-18 MED FILL — Metoprolol Tartrate Tab 25 MG: ORAL | 90 days supply | Qty: 180 | Fill #1 | Status: CN

## 2024-07-18 NOTE — Therapy (Incomplete)
 OUTPATIENT PHYSICAL THERAPY TREATMENT  Patient Name: [name] [name] MRN: [id number] DOB:12/29/1940, 83 y.o., male Today's Date: 07/18/2024  PCP: Rudy Alyce RAMAN, MD REFERRING PROVIDER:   Pegge Toribio PARAS, PA-C   END OF SESSION:      Past Medical History:  Diagnosis Date   Arthritis    Depression    Diabetes mellitus without complication (HCC)    Gout    Hyperlipidemia    Hypertension    Ischemic cardiomyopathy    No past surgical history on file. 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   Ischemic cardiomyopathy 06/12/2018   Thrombocytosis 04/16/2016   Adjustment reaction with prolonged depressive reaction 01/22/2014   Visual loss, right eye 07/30/2012   Type 2 diabetes mellitus (HCC) 06/16/2011   Coronary artery disease 06/16/2011   Hypertension associated with diabetes (HCC) 06/16/2011   ONSET DATE: 11/16/2023 REFERRING DIAG: P36.487 (ICD-10-CM) - Acute ischemic left MCA stroke (HCC)  THERAPY DIAG:  No diagnosis found.  Rationale for Evaluation and Treatment: Rehabilitation  SUBJECTIVE:                                                                                                                                                                                             SUBJECTIVE STATEMENT:   Pt states he is feeling good today. No changes since last time. Still reports issues with walking and the cold making his bones ache. No reported pain today.  PERTINENT HISTORY: Pt is a pleasant 83 y/o male referred to PT s/p L MCA stroke. Presented 11/16/2023 to [facility] with right facial droop left gaze preference and dysarthria. CT/MRI showed small acute nonhemorrhagic left MCA distribution infarction involving the left frontal lobe. Pt admitted to rehab 11/19/2023 PT/ST/OT. Pt now presents to PT eval in [place]. He reports he  primarily uses a 4WW at home, but does not ambulate much during the day (maybe ten minutes total). He reports difficulty with standing up from chairs. He must use grab bars in his bathroom due to difficulty standing up from toilet. He has difficulty with stairs, requires help getting up step to enter his home. He reports no recent falls, but that he does stumble with his 4WW when fatigued. 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  PAIN:  Are you having pain? No   PRECAUTIONS: Fall; Per neurology 01/22/24, goal BP of 130-140/70-80  WEIGHT BEARING RESTRICTIONS: No  FALLS: Has patient fallen in  last 6 months? No but does report stumbling with fatigue  LIVING ENVIRONMENT: Lives with: lives with their family son and DIL Lives in: House/apartment, two level home but not using upstairs, stays on first floor Stairs: 1 step to enter home, says no handrails but he has help getting up step Has following equipment at home: Walker - 4 wheeled, shower chair, and Grab bars  PLOF: Independent  PATIENT GOALS: everything: walking, balance, strength   OBJECTIVE:  Note: Objective measures were completed at Evaluation unless otherwise noted.  DIAGNOSTIC FINDINGS: MR brain 11/16/23:  IMPRESSION: 1. Small acute nonhemorrhagic left MCA distribution infarct involving the left frontal lobe. No associated mass effect. 2. Additional punctate subcentimeter acute ischemic nonhemorrhagic infarct within the contralateral right basal ganglia. 3. Underlying moderately advanced chronic microvascular ischemic disease with a few scattered remote lacunar infarcts as above. 4. Chronic occlusion of the left vertebral artery.    Electronically Signed   By: Morene Hoard M.D.   On: 11/16/2023 23:48  CT ANGIO HEAD NECK 11/16/23  IMPRESSION: No large vessel occlusion or evidence of findings amenable to neurovascular intervention.    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   Multiple intracranial vascular stenoses as above. Focal short segment occlusion of an M2 inferior division branch of the right MCA with reconstitution noted.   Mild-to-moderate stenosis of the V4 segment right vertebral artery.   Emphysema (ICD10-J43.9).     Electronically Signed   By: Donnice Mania M.D.   On: 11/16/2023 15:50  CT HEAD 11/16/23: IMPRESSION: 1. 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 2. Subtle hypoattenuation at the anterior limb of the right internal capsule. 3. 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 4. Age indeterminate infarct in the left lentiform nucleus. 5. Moderate atrophy and white matter disease likely reflects the sequela of chronic microvascular ischemia. 6. No acute hemorrhage or mass lesion.   These results were called by telephone at the time of interpretation on 11/16/2023 at 3:13 pm to provider Dr. Matthews, who verbally acknowledged these results.    Electronically Signed   By: Lonni Necessary M.D.   On: 11/16/2023 15:14                                                                                 TREATMENT DATE 07/18/2024 :  **Vitals seated: 138/69, HR 89**  Glute/hip mm strengthening circuit (2 rounds) standing hip flexion w/ 2# AW on B LE x20ea heel raises w/ 2# AW on B LE x15 standing hip abduction w/ 2# AW on B LE, x20ea Standing hip extension w/ 2# AW on B LE, x20ea Sit<>stand w/ airex pad on seat for increased height, 3x5, arms crossed on chest, occasional min assist from SPT for full stand position Side step-ups/step-downs onto 4in step w/  2# AW on B LE, 2x8 RW ambulation with AW 2# AW on B LE, 2 laps, 358ft   *Unless otherwise  stated, CGA was provided and gait belt donned  in order to ensure pt safety*   PATIENT EDUCATION: Education details: Pt educated throughout session about proper posture and technique with exercises. Improved exercise technique, movement at target joints, use of target muscles after min to mod verbal, visual, tactile cues.  Person educated: Patient Education method: Explanation, Demonstration, and Verbal cues Education comprehension: verbalized understanding and returned demonstration  HOME EXERCISE PROGRAM: Access Code: [email or web address] URL: [email or web address] Date: 07/02/2024 Prepared by: Lonni Gainer Exercises - Side Stepping with Counter Support  - 1 x daily - 7 x weekly - 3 sets - 10 reps - Sit to Stand with Armchair  - 1 x daily - 7 x weekly - 3 sets - 10 reps - Walking with Counter Support  - 1 x daily - 7 x weekly - 3 sets - 10 reps - Semi-Tandem Balance at [employer]  - 1 x daily - 7 x weekly - 2 sets - 4 reps - 15 seconds hold - Standing March with Counter Support  - 1 x daily - 7 x weekly - 3 sets - 10 reps - Lateral Step Up with Counter Support  - 1 x daily - 7 x weekly - 2 sets - 10 reps - Heel Raises with Counter Support  - 1 x daily - 7 x weekly - 3 sets - 15 reps - Standing Hip Abduction with Counter Support  - 1 x daily - 7 x weekly - 2 sets - 10 reps - One leg on ground other on shelf of low cabinet   - 1 x daily - 7 x weekly - 2 sets - 30 sec hold   GOALS: Goals reviewed with patient? Yes  SHORT TERM GOALS: Target date: 01/18/2024 Patient will be independent in home exercise program to improve strength/mobility for better functional independence with ADLs. Baseline: 01/15/2024= Patient able to verbalize and demonstrate his seated HEP without prompting- states compliance.  Goal status: MET  LONG TERM GOALS: Target date: 07/18/2024 1. Patient will increase SIS-16 score by at least 10 points  to demonstrate increased ease with ADLs and quality of life.   Baseline: 54, 7/23: 51  9/25:66 Goal status: MET  2.  Patient (> 76 years old) will complete five times sit to stand test in < 15 seconds indicating an increased LE strength and improved balance. Baseline: 44.2 sec with use of BUE; 01/15/2024= 36.46 sec with BUE Support (Still unable to rise without UE Support and some posterior lean- CGA)  7/14 22.08 sec with UE pushing from arm rest. 39.30 sec with no UE support and min assist to prevent posterior LOB. 04/02/2024= 19.08 sec with min BUE support from arm rest; 04/02/2024= 32.35 sec without UE Support 9/25: 21.98 no UE - unablewithout UE support from standard armchair, 11/25: 33.80 without UE support Goal status: ONGOING  3.  Patient will increase Berg Balance score by > 6 points to demonstrate decreased fall risk during functional activities Baseline: 34 7/14: 38 04/02/2024= 46 Goal status: MET  4.  Patient will increase 10 meter walk test to >1.22m/s as to improve gait speed for better community ambulation and to reduce fall risk. Baseline: 0.53 m/s with 4WW; 01/15/2024= 0.58 m/s with 4WW 7/14: 0.51m/s; 04/02/2024= 0.68 m/s 9/25.68 m/s, 11/25 0.60 m/s Goal status: ONGOING  5.  Patient will reduce timed up and go to <11 seconds to reduce fall risk and demonstrate improved transfer/gait ability. Baseline: 33 sec with 4WW; 01/15/2024= 25.42 sec avg with 4WW 7/14: 20.72 sec avg with 4WW 20.19  sec with 4WW, 11/25 24.12 sec Goal status: ONGOING  6. Patient will increase six minute walk test distance to >1000 for progression to community ambulator and improve gait ability Baseline: 310 feet using a 4WW with CGA and only completes 3:17 sec prior to requesting to sit secondary to fatigue; 01/15/2024= 610 feet in with 4WW 7/14: 636ft with 4WW; 03/21/24: 939ft 58m13sec (achieved a negative split); 04/02/2024= 790 feet using 4WW 9/25:709 ft 10/7: 745 ft with 4WW, good reciprocal gait for most part, 11/25 723 ft Goal status: ONGOING  ASSESSMENT:  CLINICAL  IMPRESSION: Session today focusing on increasing glute/hip musculature for improved ambulation endurance and decrease risk of falls by increasing stability of LE. Vitals taken today show within recommended target window. Pt tolerated entirety of session today while 2# weighted donned to B ankles. Pt noted most difficulty with standing hip ext, needing cuing for proper form and keeping knee in extension throughout movement. Pt required short seated rest breaks occasionally due to minor SoB and pt reported fatigue. Pt showed good form and decreased lateral lean during standing hip abduction exercise. Pt able to perform sit<>stand exercise today with no UE support with arms crossed across chest using blue foam pad for additional seat height, with SPT min assist for 2 repetitions for increased forward lean for full stand. Pt reported he thought it was a great and enjoyable session while exiting building. Pt will continue to benefit from skilled therapy to address remaining deficits in order to improve overall QoL and return to PLOF.    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  REHAB POTENTIAL: Good CLINICAL DECISION MAKING: Evolving/moderate complexity  EVALUATION COMPLEXITY: Moderate  PLAN:  PT FREQUENCY: 1-2x/week  PT DURATION: 12 weeks  PLANNED INTERVENTIONS: 97164- PT Re-evaluation, 97750- Physical Performance Testing, 97110-Therapeutic exercises, 97530- Therapeutic activity, 97112- Neuromuscular re-education, 97535- Self Care, [id number]- Manual therapy,  [phone]- Gait training, [phone]- Orthotic Initial, [phone]- Orthotic/Prosthetic subsequent, [phone]- Canalith repositioning, Patient/Family education, Balance training, Stair training, Taping, Joint mobilization, Spinal mobilization, Vestibular training, DME instructions, Wheelchair mobility training, Cryotherapy, and Moist heat  PLAN FOR NEXT SESSION:    *Per neurology note 01/22/2024: target BP of 130-140/70-80* Activities to promote increased glute/ hip strengthening High intensity gait training with appropriate UE support  Activities that encourage single leg support  Note: Portions of this document were prepared using Dragon voice recognition software and although reviewed may contain unintentional dictation errors in syntax, grammar, or spelling.  Lonni KATHEE Gainer PT ,DPT Physical Therapist- [place]  [facility]

## 2024-07-18 NOTE — Telephone Encounter (Signed)
 Contacted pt via telephone and son answered phone. Informed of next appointment date and time and requested call back if anything is needed.   Note: Portions of this document were prepared using Dragon voice recognition software and although reviewed may contain unintentional dictation errors in syntax, grammar, or spelling.  Lonni KATHEE Gainer  ,DPT Physical Therapist- [place]  [facility]

## 2024-07-22 ENCOUNTER — Other Ambulatory Visit: Payer: Self-pay

## 2024-07-22 MED FILL — Losartan Potassium Tab 50 MG: ORAL | 90 days supply | Qty: 90 | Fill #1 | Status: CN

## 2024-07-22 MED FILL — Clopidogrel Bisulfate Tab 75 MG (Base Equiv): ORAL | 90 days supply | Qty: 90 | Fill #1 | Status: CN

## 2024-07-22 MED FILL — Metoprolol Tartrate Tab 25 MG: ORAL | 90 days supply | Qty: 180 | Fill #1 | Status: CN

## 2024-07-22 MED FILL — Rosuvastatin Calcium Tab 20 MG: ORAL | 90 days supply | Qty: 90 | Fill #1 | Status: CN

## 2024-07-23 ENCOUNTER — Other Ambulatory Visit: Payer: Self-pay

## 2024-07-23 ENCOUNTER — Ambulatory Visit: Admitting: Physical Therapy

## 2024-07-23 MED FILL — Metoprolol Tartrate Tab 25 MG: ORAL | 90 days supply | Qty: 180 | Fill #1 | Status: CN

## 2024-07-23 MED FILL — Losartan Potassium Tab 50 MG: ORAL | 90 days supply | Qty: 90 | Fill #1 | Status: CN

## 2024-07-23 MED FILL — Rosuvastatin Calcium Tab 20 MG: ORAL | 90 days supply | Qty: 90 | Fill #1 | Status: CN

## 2024-07-23 MED FILL — Clopidogrel Bisulfate Tab 75 MG (Base Equiv): ORAL | 90 days supply | Qty: 90 | Fill #1 | Status: CN

## 2024-07-25 ENCOUNTER — Encounter: Payer: Self-pay | Admitting: Physical Therapy

## 2024-07-25 ENCOUNTER — Ambulatory Visit: Admitting: Physical Therapy

## 2024-07-25 DIAGNOSIS — R262 Difficulty in walking, not elsewhere classified: Secondary | ICD-10-CM

## 2024-07-25 DIAGNOSIS — R278 Other lack of coordination: Secondary | ICD-10-CM

## 2024-07-25 DIAGNOSIS — I63512 Cerebral infarction due to unspecified occlusion or stenosis of left middle cerebral artery: Secondary | ICD-10-CM

## 2024-07-25 DIAGNOSIS — R2681 Unsteadiness on feet: Secondary | ICD-10-CM

## 2024-07-25 DIAGNOSIS — M6281 Muscle weakness (generalized): Secondary | ICD-10-CM

## 2024-07-25 NOTE — Therapy (Signed)
 OUTPATIENT PHYSICAL THERAPY TREATMENT/ Re-certification   Patient Name: [name] [name] MRN: [id number] DOB:[birth year], 83 y.o., male Today's Date: 07/25/2024  PCP: Rudy Alyce RAMAN, MD REFERRING PROVIDER:   Pegge Toribio PARAS, PA-C   END OF SESSION:   PT End of Session - 07/25/24 1019     Visit Number 52    Number of Visits 55    Date for Recertification  09/05/24    PT Start Time 1017    PT Stop Time 1100    PT Time Calculation (min) 43 min    Equipment Utilized During Treatment Gait belt    Activity Tolerance Patient tolerated treatment well;No increased pain    Behavior During Therapy WFL for tasks assessed/performed           Past Medical History:  Diagnosis Date   Arthritis    Depression    Diabetes mellitus without complication (HCC)    Gout    Hyperlipidemia    Hypertension    Ischemic cardiomyopathy    History reviewed. No pertinent surgical history. 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   Ischemic cardiomyopathy 06/12/2018   Thrombocytosis 04/16/2016   Adjustment reaction with prolonged depressive reaction 01/22/2014   Visual loss, right eye 07/30/2012   Type 2 diabetes mellitus (HCC) 06/16/2011   Coronary artery disease 06/16/2011   Hypertension associated with diabetes (HCC) 06/16/2011   ONSET DATE: 11/16/2023 REFERRING DIAG: P36.487 (ICD-10-CM) - Acute ischemic left MCA stroke (HCC)  THERAPY DIAG:  Difficulty in walking, not elsewhere classified  Unsteadiness on feet  Muscle weakness (generalized)  Other lack of coordination  Acute ischemic left MCA stroke (HCC)  Rationale for Evaluation and Treatment: Rehabilitation  SUBJECTIVE:                                                                                                                                                                                              SUBJECTIVE STATEMENT:   Pt states he walks on the treadmill 3x per day 6 minutes at a time. Reports L shoulder pain, no LE pain today.  PERTINENT HISTORY: Pt is a pleasant 83 y/o male referred to PT s/p L MCA stroke. Presented 11/16/2023 to [facility] with right facial droop left gaze preference and dysarthria. CT/MRI showed small acute nonhemorrhagic left MCA distribution infarction involving the left frontal lobe. Pt admitted to rehab 11/19/2023 PT/ST/OT. Pt now presents to PT eval in WC. He reports he primarily uses a 4WW at home, but  does not ambulate much during the day (maybe ten minutes total). He reports difficulty with standing up from chairs. He must use grab bars in his bathroom due to difficulty standing up from toilet. He has difficulty with stairs, requires help getting up step to enter his home. He reports no recent falls, but that he does stumble with his 4WW when fatigued. 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  PAIN:  Are you having pain? No   PRECAUTIONS: Fall; Per neurology 01/22/24, goal BP of 130-140/70-80  WEIGHT BEARING RESTRICTIONS: No  FALLS: Has patient fallen in last 6 months? No but does report stumbling with fatigue  LIVING ENVIRONMENT: Lives with: lives with their family son and DIL Lives in: House/apartment, two level home but not using upstairs, stays on first floor Stairs: 1 step to enter home, says no handrails but he has help getting up step Has following equipment at home: Walker - 4 wheeled, shower chair, and Grab bars  PLOF: Independent  PATIENT GOALS: everything: walking, balance, strength   OBJECTIVE:  Note: Objective measures were completed at Evaluation unless otherwise noted.  DIAGNOSTIC FINDINGS: MR brain 11/16/23:  IMPRESSION: 1. Small acute nonhemorrhagic left MCA distribution infarct involving the left frontal lobe. No associated mass  effect. 2. Additional punctate subcentimeter acute ischemic nonhemorrhagic infarct within the contralateral right basal ganglia. 3. Underlying moderately advanced chronic microvascular ischemic disease with a few scattered remote lacunar infarcts as above. 4. Chronic occlusion of the left vertebral artery.    Electronically Signed   By: Morene Hoard M.D.   On: 11/16/2023 23:48  CT ANGIO HEAD NECK 11/16/23  IMPRESSION: No large vessel occlusion or evidence of findings amenable to neurovascular intervention.   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   Multiple intracranial vascular stenoses as above. Focal short segment occlusion of an M2 inferior division branch of the right MCA with reconstitution noted.   Mild-to-moderate stenosis of the V4 segment right vertebral artery.   Emphysema (ICD10-J43.9).     Electronically Signed   By: Donnice Mania M.D.   On: 11/16/2023 15:50  CT HEAD 11/16/23: IMPRESSION: 1. 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 2. Subtle hypoattenuation at the anterior limb of the right internal capsule. 3. 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 4. Age indeterminate infarct in the left lentiform nucleus. 5. Moderate atrophy and white matter disease likely reflects the sequela of chronic microvascular ischemia. 6. No acute hemorrhage or mass lesion.   These results were called by telephone at the time of interpretation on 11/16/2023 at 3:13 pm to provider Dr. Matthews, who verbally acknowledged these results.    Electronically Signed   By: Lonni Necessary M.D.   On: 11/16/2023 15:14                                                                                 TREATMENT DATE 07/25/2024  :  **Vitals seated:  130/76, HR 91**  Circuit performed two times with 3 minutes seated break in between standing hip flexion 3# AW on B LE, 2x20 standing hip abduction 3# AW on B LE, 2x20 standing hip extension 3# AW on B LE, 2x20 standing heel raises at bar # AW on B LE, 2x20 Sit<>stand, arms at chest 3x8 Seated LAQ 3# AW on B LE, 2x20 Seated lateral step over hurdle 2 x 10 each side Resisted gait, 3# AW on B LE, 334ft   PATIENT EDUCATION: Education details: Pt educated throughout session about proper posture and technique with exercises. Improved exercise technique, movement at target joints, use of target muscles after min to mod verbal, visual, tactile cues.  Person educated: Patient Education method: Explanation, Demonstration, and Verbal cues Education comprehension: verbalized understanding and returned demonstration  HOME EXERCISE PROGRAM: Access Code: [email or web address] URL: [email or web address] Date: 07/02/2024 Prepared by: Lonni Gainer Exercises - Side Stepping with Counter Support  - 1 x daily - 7 x weekly - 3 sets - 10 reps - Sit to Stand with Armchair  - 1 x daily - 7 x weekly - 3 sets - 10 reps - Walking with Counter Support  - 1 x daily - 7 x weekly - 3 sets - 10 reps - Semi-Tandem Balance at [employer] Open  - 1 x daily - 7 x weekly - 2 sets - 4 reps - 15 seconds hold - Standing March with Counter Support  - 1 x daily - 7 x weekly - 3 sets - 10 reps - Lateral Step Up with Counter Support  - 1 x daily - 7 x weekly - 2 sets - 10 reps - Heel Raises with Counter Support  - 1 x daily - 7 x weekly - 3 sets - 15 reps - Standing Hip Abduction with Counter Support  - 1 x daily - 7 x weekly - 2 sets - 10 reps - One leg on ground other on shelf of low cabinet   - 1 x daily - 7 x weekly - 2 sets - 30 sec hold   GOALS: Goals reviewed with patient? Yes  SHORT TERM GOALS: Target date: 01/18/2024 Patient will be independent in home exercise program to improve  strength/mobility for better functional independence with ADLs. Baseline: 01/15/2024= Patient able to verbalize and demonstrate his seated HEP without prompting- states compliance.  Goal status: MET  LONG TERM GOALS: Target date: 09/05/2024 1. Patient will increase SIS-16 score by at least 10 points  to demonstrate increased ease with ADLs and quality of life.  Baseline: 54, 7/23: 51  9/25:66 Goal status: MET  2.  Patient (> 43 years old) will complete five times sit to stand test in < 15 seconds indicating an increased LE strength and improved balance. Baseline: 44.2 sec with use of BUE; 01/15/2024= 36.46 sec with BUE Support (Still unable to rise without UE Support and some posterior lean- CGA)  7/14 22.08 sec with UE pushing from arm rest. 39.30 sec with no UE support and min assist to prevent posterior LOB. 04/02/2024= 19.08 sec with min BUE support from arm rest; 04/02/2024= 32.35 sec without UE Support 9/25: 21.98 no UE - unablewithout UE support from standard armchair, 11/25: 33.80 without UE support Goal status: ONGOING  3.  Patient will increase Berg Balance score by > 6 points to demonstrate decreased fall risk during functional activities Baseline: 34 7/14: 38 04/02/2024= 46 Goal status: MET  4.  Patient will increase 10 meter walk test to >  1.43m/s as to improve gait speed for better community ambulation and to reduce fall risk. Baseline: 0.53 m/s with 4WW; 01/15/2024= 0.58 m/s with 4WW 7/14: 0.46m/s; 04/02/2024= 0.68 m/s 9/25.68 m/s, 11/25 0.60 m/s Goal status: ONGOING  5.  Patient will reduce timed up and go to <11 seconds to reduce fall risk and demonstrate improved transfer/gait ability. Baseline: 33 sec with 4WW; 01/15/2024= 25.42 sec avg with 4WW 7/14: 20.72 sec avg with 4WW 20.19 sec with 4WW, 11/25 24.12 sec Goal status: ONGOING  6. Patient will increase six minute walk test distance to >1000 for progression to community ambulator and improve gait ability Baseline: 310 feet using  a 4WW with CGA and only completes 3:17 sec prior to requesting to sit secondary to fatigue; 01/15/2024= 610 feet in with 4WW 7/14: 665ft with 4WW; 03/21/24: 950ft 49m13sec (achieved a negative split); 04/02/2024= 790 feet using 4WW 9/25:709 ft 10/7: 745 ft with 4WW, good reciprocal gait for most part, 11/25 723 ft Goal status: ONGOING  ASSESSMENT:  CLINICAL IMPRESSION:   Goals assessed 2 visits prior, see Goals for functional progress. Appointment today focusing on increasing glute/hip musculature for improved stability and decreased fall risk. Vitals taken today beginning session showed within recommended target window given by neurologist. Pt tolerated entirety of session today while 2# weighted donned to B ankles. Pt required short seated rest breaks occasionally due to pt reported fatigue. Pt was able to show corrected form of standing hip ext following verbal cuing. Most difficulty seen today with hip abduction exercises, noting discomfort in the R hip. Pt able to perform sit<>stand exercise today with no UE support with arms crossed across chest, but only with blue foam pad for additional seat height. Pt reported enjoying the session upon exiting. Pt will continue to benefit from skilled therapy to address remaining deficits in order to improve overall QoL and return to PLOF. Patient's condition has the potential to improve in response to therapy. Maximum improvement is yet to be obtained. The anticipated improvement is attainable and reasonable in a generally predictable time.    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  REHAB POTENTIAL: Good CLINICAL DECISION MAKING: Evolving/moderate complexity  EVALUATION COMPLEXITY: Moderate  PLAN:  PT FREQUENCY: 1-2x/week  PT DURATION: 12 weeks  PLANNED INTERVENTIONS: 97164- PT Re-evaluation, 97750- Physical Performance Testing, 97110-Therapeutic exercises, 97530- Therapeutic activity, 97112- Neuromuscular re-education, 97535- Self Care, [id number]- Manual therapy, [phone]- Gait training, [phone]- Orthotic Initial, [phone]- Orthotic/Prosthetic subsequent, [phone]- Canalith repositioning, Patient/Family education, Balance training, Stair training, Taping, Joint mobilization, Spinal mobilization, Vestibular training, DME instructions, Wheelchair mobility training, Cryotherapy, and Moist heat  PLAN FOR NEXT SESSION:    *Per neurology note 01/22/2024: target BP of 130-140/70-80* Activities to promote increased glute/ hip strengthening High intensity gait training with appropriate UE support  Activities that encourage single leg support    Renna Helling, SPT  I have read and reviewed the attached note and am in agreement with the documentation provided.     This licensed clinician was present and actively directing care throughout the session at all times.   Massie Dollar PT, DPT  Physical Therapist - [place]  [facility]  5:48 PM 07/25/2024

## 2024-07-29 ENCOUNTER — Other Ambulatory Visit: Payer: Self-pay

## 2024-07-29 MED FILL — Metoprolol Tartrate Tab 25 MG: ORAL | 90 days supply | Qty: 180 | Fill #1 | Status: CN

## 2024-07-29 MED FILL — Losartan Potassium Tab 50 MG: ORAL | 90 days supply | Qty: 90 | Fill #1 | Status: CN

## 2024-07-29 MED FILL — Clopidogrel Bisulfate Tab 75 MG (Base Equiv): ORAL | 90 days supply | Qty: 90 | Fill #1 | Status: CN

## 2024-07-29 MED FILL — Rosuvastatin Calcium Tab 20 MG: ORAL | 90 days supply | Qty: 90 | Fill #1 | Status: CN

## 2024-07-30 ENCOUNTER — Ambulatory Visit: Admitting: Physical Therapy

## 2024-07-30 DIAGNOSIS — M6281 Muscle weakness (generalized): Secondary | ICD-10-CM

## 2024-07-30 DIAGNOSIS — I63512 Cerebral infarction due to unspecified occlusion or stenosis of left middle cerebral artery: Secondary | ICD-10-CM

## 2024-07-30 DIAGNOSIS — R2681 Unsteadiness on feet: Secondary | ICD-10-CM

## 2024-07-30 DIAGNOSIS — R262 Difficulty in walking, not elsewhere classified: Secondary | ICD-10-CM | POA: Diagnosis not present

## 2024-07-30 NOTE — Therapy (Signed)
 " OUTPATIENT PHYSICAL THERAPY TREATMENT  Patient Name: [name] [name] MRN: [id number] DOB:1941/07/15, 83 y.o., male Today's Date: 07/30/2024  PCP: Rudy Alyce RAMAN, MD REFERRING PROVIDER:   Pegge Toribio PARAS, PA-C   END OF SESSION:   PT End of Session - 07/30/24 1010     Visit Number 53    Number of Visits 55    Date for Recertification  09/05/24    PT Start Time 1015    PT Stop Time 1057    PT Time Calculation (min) 42 min    Equipment Utilized During Treatment Gait belt    Activity Tolerance Patient tolerated treatment well;No increased pain    Behavior During Therapy WFL for tasks assessed/performed            Past Medical History:  Diagnosis Date   Arthritis    Depression    Diabetes mellitus without complication (HCC)    Gout    Hyperlipidemia    Hypertension    Ischemic cardiomyopathy    No past surgical history on file. 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   Ischemic cardiomyopathy 06/12/2018   Thrombocytosis 04/16/2016   Adjustment reaction with prolonged depressive reaction 01/22/2014   Visual loss, right eye 07/30/2012   Type 2 diabetes mellitus (HCC) 06/16/2011   Coronary artery disease 06/16/2011   Hypertension associated with diabetes (HCC) 06/16/2011   ONSET DATE: 11/16/2023 REFERRING DIAG: P36.487 (ICD-10-CM) - Acute ischemic left MCA stroke (HCC)  THERAPY DIAG:  Difficulty in walking, not elsewhere classified  Unsteadiness on feet  Muscle weakness (generalized)  Acute ischemic left MCA stroke (HCC)  Rationale for Evaluation and Treatment: Rehabilitation  SUBJECTIVE:                                                                                                                                                                                             SUBJECTIVE STATEMENT:   Pt  states he walks on the treadmill 3x per day 6 minutes at a time.  No changes to report since last session.  PERTINENT HISTORY: Pt is a pleasant 83 y/o male referred to PT s/p L MCA stroke. Presented 11/16/2023 to [facility] with right facial droop left gaze preference and dysarthria. CT/MRI showed small acute nonhemorrhagic left MCA distribution infarction involving the left frontal lobe. Pt admitted to rehab 11/19/2023 PT/ST/OT. Pt now presents to PT eval in WC. He reports he primarily uses a 4WW at home, but does not ambulate much during  the day (maybe ten minutes total). He reports difficulty with standing up from chairs. He must use grab bars in his bathroom due to difficulty standing up from toilet. He has difficulty with stairs, requires help getting up step to enter his home. He reports no recent falls, but that he does stumble with his 4WW when fatigued. 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  PAIN:  Are you having pain? No   PRECAUTIONS: Fall; Per neurology 01/22/24, goal BP of 130-140/70-80  WEIGHT BEARING RESTRICTIONS: No  FALLS: Has patient fallen in last 6 months? No but does report stumbling with fatigue  LIVING ENVIRONMENT: Lives with: lives with their family son and DIL Lives in: House/apartment, two level home but not using upstairs, stays on first floor Stairs: 1 step to enter home, says no handrails but he has help getting up step Has following equipment at home: Walker - 4 wheeled, shower chair, and Grab bars  PLOF: Independent  PATIENT GOALS: everything: walking, balance, strength   OBJECTIVE:  Note: Objective measures were completed at Evaluation unless otherwise noted.  DIAGNOSTIC FINDINGS: MR brain 11/16/23:  IMPRESSION: 1. Small acute nonhemorrhagic left MCA distribution infarct involving the left frontal lobe. No associated mass effect. 2. Additional punctate subcentimeter acute ischemic  nonhemorrhagic infarct within the contralateral right basal ganglia. 3. Underlying moderately advanced chronic microvascular ischemic disease with a few scattered remote lacunar infarcts as above. 4. Chronic occlusion of the left vertebral artery.    Electronically Signed   By: Morene Hoard M.D.   On: 11/16/2023 23:48  CT ANGIO HEAD NECK 11/16/23  IMPRESSION: No large vessel occlusion or evidence of findings amenable to neurovascular intervention.   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   Multiple intracranial vascular stenoses as above. Focal short segment occlusion of an M2 inferior division branch of the right MCA with reconstitution noted.   Mild-to-moderate stenosis of the V4 segment right vertebral artery.   Emphysema (ICD10-J43.9).     Electronically Signed   By: Donnice Mania M.D.   On: 11/16/2023 15:50  CT HEAD 11/16/23: IMPRESSION: 1. 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 2. Subtle hypoattenuation at the anterior limb of the right internal capsule. 3. 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 4. Age indeterminate infarct in the left lentiform nucleus. 5. Moderate atrophy and white matter disease likely reflects the sequela of chronic microvascular ischemia. 6. No acute hemorrhage or mass lesion.   These results were called by telephone at the time of interpretation on 11/16/2023 at 3:13 pm to provider Dr. Matthews, who verbally acknowledged these results.    Electronically Signed   By: Lonni Necessary M.D.   On: 11/16/2023 15:14                                                                                 TREATMENT DATE 07/30/2024 :   TA- To improve functional movements patterns for  everyday tasks    Circuit performed two times with 3 minutes seated break in between penultimate and last set standing hip flexion 3# AW on B LE, x20 standing hip abduction 3# AW on B LE, x20 standing hip extension 3# AW on B LE, x20 standing heel raises at bar # AW on B LE, x20 Seated LAQ 3# AW on B LE, 2x10ea LE Resisted gait, 3# AW on B LE, 2 x 161ft no AD, close CGA STS x 8 no UE assist   NMR: To facilitate reeducation of movement, balance, posture, coordination, and/or proprioception/kinesthetic sense.  Activity Description: on airex pad tapping light pods ant to pt on step  Activity Setting:  random Number of Pods:  4 Cycles/Sets:  3 Duration (Time or Hit Count):  1 min       PATIENT EDUCATION: Education details: Pt educated throughout session about proper posture and technique with exercises. Improved exercise technique, movement at target joints, use of target muscles after min to mod verbal, visual, tactile cues.  Person educated: Patient Education method: Explanation, Demonstration, and Verbal cues Education comprehension: verbalized understanding and returned demonstration  HOME EXERCISE PROGRAM: Access Code: [email or web address] URL: [email or web address] Date: 07/02/2024 Prepared by: Lonni Gainer Exercises - Side Stepping with Counter Support  - 1 x daily - 7 x weekly - 3 sets - 10 reps - Sit to Stand with Armchair  - 1 x daily - 7 x weekly - 3 sets - 10 reps - Walking with Counter Support  - 1 x daily - 7 x weekly - 3 sets - 10 reps - Semi-Tandem Balance at [employer] Open  - 1 x daily - 7 x weekly - 2 sets - 4 reps - 15 seconds hold - Standing March with Counter Support  - 1 x daily - 7 x weekly - 3 sets - 10 reps - Lateral Step Up with Counter Support  - 1 x daily - 7 x weekly - 2 sets - 10 reps - Heel Raises with Counter Support  - 1 x daily - 7 x weekly - 3 sets - 15 reps - Standing Hip Abduction with Counter Support  - 1 x daily - 7 x weekly - 2 sets -  10 reps - One leg on ground other on shelf of low cabinet   - 1 x daily - 7 x weekly - 2 sets - 30 sec hold   GOALS: Goals reviewed with patient? Yes  SHORT TERM GOALS: Target date: 01/18/2024 Patient will be independent in home exercise program to improve strength/mobility for better functional independence with ADLs. Baseline: 01/15/2024= Patient able to verbalize and demonstrate his seated HEP without prompting- states compliance.  Goal status: MET  LONG TERM GOALS: Target date: 09/05/2024 1. Patient will increase SIS-16 score by at least 10 points  to demonstrate increased ease with ADLs and quality of life.  Baseline: 54, 7/23: 51  9/25:66 Goal status: MET  2.  Patient (> 18 years old) will complete five times sit to stand test in < 15 seconds indicating an increased LE strength and improved balance. Baseline: 44.2 sec with use of BUE; 01/15/2024= 36.46 sec with BUE Support (Still unable to rise without UE Support and some posterior lean- CGA)  7/14 22.08 sec with UE pushing from arm rest. 39.30 sec with no UE support and min assist to prevent posterior LOB. 04/02/2024= 19.08 sec with min BUE support from arm rest; 04/02/2024= 32.35 sec without UE Support 9/25: 21.98 no UE -  unablewithout UE support from standard armchair, 11/25: 33.80 without UE support Goal status: ONGOING  3.  Patient will increase Berg Balance score by > 6 points to demonstrate decreased fall risk during functional activities Baseline: 34 7/14: 38 04/02/2024= 46 Goal status: MET  4.  Patient will increase 10 meter walk test to >1.36m/s as to improve gait speed for better community ambulation and to reduce fall risk. Baseline: 0.53 m/s with 4WW; 01/15/2024= 0.58 m/s with 4WW 7/14: 0.78m/s; 04/02/2024= 0.68 m/s 9/25.68 m/s, 11/25 0.60 m/s Goal status: ONGOING  5.  Patient will reduce timed up and go to <11 seconds to reduce fall risk and demonstrate improved transfer/gait ability. Baseline: 33 sec with 4WW; 01/15/2024= 25.42  sec avg with 4WW 7/14: 20.72 sec avg with 4WW 20.19 sec with 4WW, 11/25 24.12 sec Goal status: ONGOING  6. Patient will increase six minute walk test distance to >1000 for progression to community ambulator and improve gait ability Baseline: 310 feet using a 4WW with CGA and only completes 3:17 sec prior to requesting to sit secondary to fatigue; 01/15/2024= 610 feet in with 4WW 7/14: 674ft with 4WW; 03/21/24: 952ft 47m13sec (achieved a negative split); 04/02/2024= 790 feet using 4WW 9/25:709 ft 10/7: 745 ft with 4WW, good reciprocal gait for most part, 11/25 723 ft Goal status: ONGOING  ASSESSMENT:  CLINICAL IMPRESSION:   Patient presents with good motivation for completion of physical therapy activities.  Patient continues with lower extremity strength and functional ambulation and strengthening training.  Patient also continues to progress with his balance training and showed high difficulty with activities performed blaze pods this date.Pt will continue to benefit from skilled physical therapy intervention to address impairments, improve QOL, and attain therapy goals.     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  REHAB POTENTIAL: Good CLINICAL DECISION MAKING: Evolving/moderate complexity  EVALUATION COMPLEXITY: Moderate  PLAN:  PT FREQUENCY: 1-2x/week  PT DURATION: 12 weeks  PLANNED INTERVENTIONS: 97164- PT Re-evaluation, 97750- Physical Performance Testing, 97110-Therapeutic exercises, 97530- Therapeutic activity, 97112- Neuromuscular  re-education, 97535- Self Care, [id number]- Manual therapy, [phone]- Gait training, [phone]- Orthotic Initial, [phone]- Orthotic/Prosthetic subsequent, [phone]- Canalith repositioning, Patient/Family education, Balance training, Stair training, Taping, Joint mobilization, Spinal mobilization, Vestibular training, DME instructions, Wheelchair mobility training, Cryotherapy, and Moist heat  PLAN FOR NEXT SESSION:    *Per neurology note 01/22/2024: target BP of 130-140/70-80* Activities to promote increased glute/ hip strengthening High intensity gait training with appropriate UE support  Activities that encourage single leg support    Note: Portions of this document were prepared using Dragon voice recognition software and although reviewed may contain unintentional dictation errors in syntax, grammar, or spelling.  Lonni KATHEE Gainer PT ,DPT Physical Therapist- [facility]   10:10 AM 07/30/2024      "

## 2024-08-06 ENCOUNTER — Ambulatory Visit: Admitting: Physical Therapy

## 2024-08-06 NOTE — Therapy (Deleted)
 " OUTPATIENT PHYSICAL THERAPY TREATMENT  Patient Name: [name] [name] MRN: [id number] DOB:03-Nov-1940, 83 y.o., male Today's Date: 08/06/2024  PCP: Rudy Alyce RAMAN, MD REFERRING PROVIDER:   Pegge Toribio PARAS, PA-C   END OF SESSION:       Past Medical History:  Diagnosis Date   Arthritis    Depression    Diabetes mellitus without complication (HCC)    Gout    Hyperlipidemia    Hypertension    Ischemic cardiomyopathy    No past surgical history on file. 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   Ischemic cardiomyopathy 06/12/2018   Thrombocytosis 04/16/2016   Adjustment reaction with prolonged depressive reaction 01/22/2014   Visual loss, right eye 07/30/2012   Type 2 diabetes mellitus (HCC) 06/16/2011   Coronary artery disease 06/16/2011   Hypertension associated with diabetes (HCC) 06/16/2011   ONSET DATE: 11/16/2023 REFERRING DIAG: P36.487 (ICD-10-CM) - Acute ischemic left MCA stroke (HCC)  THERAPY DIAG:  No diagnosis found.  Rationale for Evaluation and Treatment: Rehabilitation  SUBJECTIVE:                                                                                                                                                                                             SUBJECTIVE STATEMENT:   Pt states he walks on the treadmill 3x per day 6 minutes at a time.  No changes to report since last session.  PERTINENT HISTORY: Pt is a pleasant 83 y/o male referred to PT s/p L MCA stroke. Presented 11/16/2023 to [facility] with right facial droop left gaze preference and dysarthria. CT/MRI showed small acute nonhemorrhagic left MCA distribution infarction involving the left frontal lobe. Pt admitted to rehab 11/19/2023 PT/ST/OT. Pt now presents to PT eval in WC. He reports he primarily uses a 4WW at home, but does not  ambulate much during the day (maybe ten minutes total). He reports difficulty with standing up from chairs. He must use grab bars in his bathroom due to difficulty standing up from toilet. He has difficulty with stairs, requires help getting up step to enter his home. He reports no recent falls, but that he does stumble with his 4WW when fatigued. 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  PAIN:  Are you having pain? No   PRECAUTIONS: Fall; Per neurology 01/22/24, goal BP of 130-140/70-80  WEIGHT BEARING RESTRICTIONS: No  FALLS: Has patient fallen in last 6 months?  No but does report stumbling with fatigue  LIVING ENVIRONMENT: Lives with: lives with their family son and DIL Lives in: House/apartment, two level home but not using upstairs, stays on first floor Stairs: 1 step to enter home, says no handrails but he has help getting up step Has following equipment at home: Walker - 4 wheeled, shower chair, and Grab bars  PLOF: Independent  PATIENT GOALS: everything: walking, balance, strength   OBJECTIVE:  Note: Objective measures were completed at Evaluation unless otherwise noted.  DIAGNOSTIC FINDINGS: MR brain 11/16/23:  IMPRESSION: 1. Small acute nonhemorrhagic left MCA distribution infarct involving the left frontal lobe. No associated mass effect. 2. Additional punctate subcentimeter acute ischemic nonhemorrhagic infarct within the contralateral right basal ganglia. 3. Underlying moderately advanced chronic microvascular ischemic disease with a few scattered remote lacunar infarcts as above. 4. Chronic occlusion of the left vertebral artery.    Electronically Signed   By: Morene Hoard M.D.   On: 11/16/2023 23:48  CT ANGIO HEAD NECK 11/16/23  IMPRESSION: No large vessel occlusion or evidence of findings amenable to neurovascular intervention.   Occlusion of the nondominant left vertebral  artery from the V3 segment of the distal V4 segment which may be chronic and related to atherosclerosis.   Multiple intracranial vascular stenoses as above. Focal short segment occlusion of an M2 inferior division branch of the right MCA with reconstitution noted.   Mild-to-moderate stenosis of the V4 segment right vertebral artery.   Emphysema (ICD10-J43.9).     Electronically Signed   By: Donnice Mania M.D.   On: 11/16/2023 15:50  CT HEAD 11/16/23: IMPRESSION: 1. 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 2. Subtle hypoattenuation at the anterior limb of the right internal capsule. 3. 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 4. Age indeterminate infarct in the left lentiform nucleus. 5. Moderate atrophy and white matter disease likely reflects the sequela of chronic microvascular ischemia. 6. No acute hemorrhage or mass lesion.   These results were called by telephone at the time of interpretation on 11/16/2023 at 3:13 pm to provider Dr. Matthews, who verbally acknowledged these results.    Electronically Signed   By: Lonni Necessary M.D.   On: 11/16/2023 15:14                                                                                 TREATMENT DATE 08/06/2024 :   TA- To improve functional movements patterns for everyday tasks    Circuit performed two times with 3 minutes seated break in between penultimate and last set standing hip flexion 3# AW on B LE, x20 standing hip abduction 3# AW on B LE, x20 standing hip extension 3# AW on B LE, x20 standing heel raises at bar # AW on B LE, x20 Seated LAQ 3# AW on B LE, 2x10ea LE Resisted gait, 3# AW on B LE, 2 x 172ft no AD, close CGA STS x 8 no UE assist   NMR: To facilitate reeducation of movement, balance, posture, coordination, and/or proprioception/kinesthetic  sense.  Activity Description: on airex  pad tapping light pods ant to pt on step  Activity Setting:  random Number of Pods:  4 Cycles/Sets:  3 Duration (Time or Hit Count):  1 min       PATIENT EDUCATION: Education details: Pt educated throughout session about proper posture and technique with exercises. Improved exercise technique, movement at target joints, use of target muscles after min to mod verbal, visual, tactile cues.  Person educated: Patient Education method: Explanation, Demonstration, and Verbal cues Education comprehension: verbalized understanding and returned demonstration  HOME EXERCISE PROGRAM: Access Code: [email or web address] URL: [email or web address] Date: 07/02/2024 Prepared by: Lonni Gainer Exercises - Side Stepping with Counter Support  - 1 x daily - 7 x weekly - 3 sets - 10 reps - Sit to Stand with Armchair  - 1 x daily - 7 x weekly - 3 sets - 10 reps - Walking with Counter Support  - 1 x daily - 7 x weekly - 3 sets - 10 reps - Semi-Tandem Balance at [employer] Open  - 1 x daily - 7 x weekly - 2 sets - 4 reps - 15 seconds hold - Standing March with Counter Support  - 1 x daily - 7 x weekly - 3 sets - 10 reps - Lateral Step Up with Counter Support  - 1 x daily - 7 x weekly - 2 sets - 10 reps - Heel Raises with Counter Support  - 1 x daily - 7 x weekly - 3 sets - 15 reps - Standing Hip Abduction with Counter Support  - 1 x daily - 7 x weekly - 2 sets - 10 reps - One leg on ground other on shelf of low cabinet   - 1 x daily - 7 x weekly - 2 sets - 30 sec hold   GOALS: Goals reviewed with patient? Yes  SHORT TERM GOALS: Target date: 01/18/2024 Patient will be independent in home exercise program to improve strength/mobility for better functional independence with ADLs. Baseline: 01/15/2024= Patient able to verbalize and demonstrate his seated HEP without prompting- states compliance.  Goal status: MET  LONG TERM GOALS: Target date: 09/05/2024 1. Patient  will increase SIS-16 score by at least 10 points  to demonstrate increased ease with ADLs and quality of life.  Baseline: 54, 7/23: 51  9/25:66 Goal status: MET  2.  Patient (> 35 years old) will complete five times sit to stand test in < 15 seconds indicating an increased LE strength and improved balance. Baseline: 44.2 sec with use of BUE; 01/15/2024= 36.46 sec with BUE Support (Still unable to rise without UE Support and some posterior lean- CGA)  7/14 22.08 sec with UE pushing from arm rest. 39.30 sec with no UE support and min assist to prevent posterior LOB. 04/02/2024= 19.08 sec with min BUE support from arm rest; 04/02/2024= 32.35 sec without UE Support 9/25: 21.98 no UE - unablewithout UE support from standard armchair, 11/25: 33.80 without UE support Goal status: ONGOING  3.  Patient will increase Berg Balance score by > 6 points to demonstrate decreased fall risk during functional activities Baseline: 34 7/14: 38 04/02/2024= 46 Goal status: MET  4.  Patient will increase 10 meter walk test to >1.80m/s as to improve gait speed for better community ambulation and to reduce fall risk. Baseline: 0.53 m/s with 4WW; 01/15/2024= 0.58 m/s with 4WW 7/14: 0.51m/s; 04/02/2024= 0.68 m/s 9/25.68 m/s, 11/25 0.60 m/s Goal status: ONGOING  5.  Patient will reduce timed up and go to <11  seconds to reduce fall risk and demonstrate improved transfer/gait ability. Baseline: 33 sec with 4WW; 01/15/2024= 25.42 sec avg with 4WW 7/14: 20.72 sec avg with 4WW 20.19 sec with 4WW, 11/25 24.12 sec Goal status: ONGOING  6. Patient will increase six minute walk test distance to >1000 for progression to community ambulator and improve gait ability Baseline: 310 feet using a 4WW with CGA and only completes 3:17 sec prior to requesting to sit secondary to fatigue; 01/15/2024= 610 feet in with 4WW 7/14: 648ft with 4WW; 03/21/24: 939ft 2m13sec (achieved a negative split); 04/02/2024= 790 feet using 4WW 9/25:709 ft 10/7: 745  ft with 4WW, good reciprocal gait for most part, 11/25 723 ft Goal status: ONGOING  ASSESSMENT:  CLINICAL IMPRESSION:   Patient presents with good motivation for completion of physical therapy activities.  Patient continues with lower extremity strength and functional ambulation and strengthening training.  Patient also continues to progress with his balance training and showed high difficulty with activities performed blaze pods this date.Pt will continue to benefit from skilled physical therapy intervention to address impairments, improve QOL, and attain therapy goals.     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  REHAB POTENTIAL: Good CLINICAL DECISION MAKING: Evolving/moderate complexity  EVALUATION COMPLEXITY: Moderate  PLAN:  PT FREQUENCY: 1-2x/week  PT DURATION: 12 weeks  PLANNED INTERVENTIONS: [id number]- PT Re-evaluation, 97750- Physical Performance Testing, 97110-Therapeutic exercises, 97530- Therapeutic activity, 97112- Neuromuscular re-education, 97535- Self Care, [id number]- Manual therapy, [phone]- Gait training, [phone]- Orthotic Initial, [phone]- Orthotic/Prosthetic subsequent, [phone]- Canalith repositioning, Patient/Family education, Balance training, Stair training, Taping, Joint mobilization, Spinal mobilization, Vestibular training, DME instructions, Wheelchair mobility training, Cryotherapy, and Moist heat  PLAN FOR NEXT SESSION:    *Per neurology note 01/22/2024: target BP of 130-140/70-80* Activities to promote increased glute/ hip  strengthening High intensity gait training with appropriate UE support  Activities that encourage single leg support    Note: Portions of this document were prepared using Dragon voice recognition software and although reviewed may contain unintentional dictation errors in syntax, grammar, or spelling.  Lonni KATHEE Gainer PT ,DPT Physical Therapist- [place]  [facility]   7:56 AM 08/06/2024      "

## 2024-08-13 ENCOUNTER — Telehealth: Payer: Self-pay | Admitting: Physical Therapy

## 2024-08-13 ENCOUNTER — Ambulatory Visit: Attending: Physician Assistant | Admitting: Physical Therapy

## 2024-08-13 DIAGNOSIS — I63512 Cerebral infarction due to unspecified occlusion or stenosis of left middle cerebral artery: Secondary | ICD-10-CM | POA: Insufficient documentation

## 2024-08-13 DIAGNOSIS — M6281 Muscle weakness (generalized): Secondary | ICD-10-CM | POA: Insufficient documentation

## 2024-08-13 DIAGNOSIS — R262 Difficulty in walking, not elsewhere classified: Secondary | ICD-10-CM | POA: Insufficient documentation

## 2024-08-13 DIAGNOSIS — R2681 Unsteadiness on feet: Secondary | ICD-10-CM | POA: Insufficient documentation

## 2024-08-13 NOTE — Telephone Encounter (Signed)
 Pt contacted via telephone and author left voice mail informing of missed appointment and informed pt of future PT appointment date and time.       Massie Dollar PT, DPT  Physical Therapist - [place]  [facility]  10:41 AM 08/13/2024

## 2024-08-15 ENCOUNTER — Ambulatory Visit: Admitting: Physical Therapy

## 2024-08-15 NOTE — Therapy (Incomplete)
 " OUTPATIENT PHYSICAL THERAPY TREATMENT  Patient Name: [name] [name] MRN: [id number] DOB:[birth year], 84 y.o., male Today's Date: 08/15/2024  PCP: Rudy Alyce RAMAN, MD REFERRING PROVIDER:   Pegge Toribio PARAS, PA-C   END OF SESSION:       Past Medical History:  Diagnosis Date   Arthritis    Depression    Diabetes mellitus without complication (HCC)    Gout    Hyperlipidemia    Hypertension    Ischemic cardiomyopathy    No past surgical history on file. 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   Ischemic cardiomyopathy 06/12/2018   Thrombocytosis 04/16/2016   Adjustment reaction with prolonged depressive reaction 01/22/2014   Visual loss, right eye 07/30/2012   Type 2 diabetes mellitus (HCC) 06/16/2011   Coronary artery disease 06/16/2011   Hypertension associated with diabetes (HCC) 06/16/2011   ONSET DATE: 11/16/2023 REFERRING DIAG: P36.487 (ICD-10-CM) - Acute ischemic left MCA stroke (HCC)  THERAPY DIAG:  Difficulty in walking, not elsewhere classified  Unsteadiness on feet  Muscle weakness (generalized)  Rationale for Evaluation and Treatment: Rehabilitation  SUBJECTIVE:                                                                                                                                                                                             SUBJECTIVE STATEMENT:   Pt states he walks on the treadmill 3x per day 6 minutes at a time.  No changes to report since last session.  PERTINENT HISTORY: Pt is a pleasant 84 y/o male referred to PT s/p L MCA stroke. Presented 11/16/2023 to [facility] with right facial droop left gaze preference and dysarthria. CT/MRI showed small acute nonhemorrhagic left MCA distribution infarction involving the left frontal lobe. Pt admitted to rehab 11/19/2023 PT/ST/OT. Pt now  presents to PT eval in WC. He reports he primarily uses a 4WW at home, but does not ambulate much during the day (maybe ten minutes total). He reports difficulty with standing up from chairs. He must use grab bars in his bathroom due to difficulty standing up from toilet. He has difficulty with stairs, requires help getting up step to enter his home. He reports no recent falls, but that he does stumble with his 4WW when fatigued. 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  PAIN:  Are you having pain? No   PRECAUTIONS: Fall; Per neurology 01/22/24, goal BP of 130-140/70-80  WEIGHT BEARING  RESTRICTIONS: No  FALLS: Has patient fallen in last 6 months? No but does report stumbling with fatigue  LIVING ENVIRONMENT: Lives with: lives with their family son and DIL Lives in: House/apartment, two level home but not using upstairs, stays on first floor Stairs: 1 step to enter home, says no handrails but he has help getting up step Has following equipment at home: Walker - 4 wheeled, shower chair, and Grab bars  PLOF: Independent  PATIENT GOALS: everything: walking, balance, strength   OBJECTIVE:  Note: Objective measures were completed at Evaluation unless otherwise noted.  DIAGNOSTIC FINDINGS: MR brain 11/16/23:  IMPRESSION: 1. Small acute nonhemorrhagic left MCA distribution infarct involving the left frontal lobe. No associated mass effect. 2. Additional punctate subcentimeter acute ischemic nonhemorrhagic infarct within the contralateral right basal ganglia. 3. Underlying moderately advanced chronic microvascular ischemic disease with a few scattered remote lacunar infarcts as above. 4. Chronic occlusion of the left vertebral artery.    Electronically Signed   By: Morene Hoard M.D.   On: 11/16/2023 23:48  CT ANGIO HEAD NECK 11/16/23  IMPRESSION: No large vessel occlusion or evidence of findings  amenable to neurovascular intervention.   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   Multiple intracranial vascular stenoses as above. Focal short segment occlusion of an M2 inferior division branch of the right MCA with reconstitution noted.   Mild-to-moderate stenosis of the V4 segment right vertebral artery.   Emphysema (ICD10-J43.9).     Electronically Signed   By: Donnice Mania M.D.   On: 11/16/2023 15:50  CT HEAD 11/16/23: IMPRESSION: 1. 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 2. Subtle hypoattenuation at the anterior limb of the right internal capsule. 3. 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 4. Age indeterminate infarct in the left lentiform nucleus. 5. Moderate atrophy and white matter disease likely reflects the sequela of chronic microvascular ischemia. 6. No acute hemorrhage or mass lesion.   These results were called by telephone at the time of interpretation on 11/16/2023 at 3:13 pm to provider Dr. Matthews, who verbally acknowledged these results.    Electronically Signed   By: Lonni Necessary M.D.   On: 11/16/2023 15:14                                                                                 TREATMENT DATE 08/15/2024 :   TA- To improve functional movements patterns for everyday tasks   Circuit performed two times with 3 minutes seated break in between penultimate and last set standing hip flexion 3# AW on B LE, x20 standing hip abduction 3# AW on B LE, x20 standing hip extension 3# AW on B LE, x20 standing heel raises at bar # AW on B LE, x20 Seated LAQ 3# AW on B LE, 2x10ea LE Resisted gait, 3# AW on B LE, 2 x 169ft no AD, close CGA STS x 8 no UE assist   NMR: To facilitate reeducation of  movement, balance,  posture, coordination, and/or proprioception/kinesthetic sense.  Activity Description: on airex pad tapping light pods ant to pt on step  Activity Setting:  random Number of Pods:  4 Cycles/Sets:  3 Duration (Time or Hit Count):  1 min    PATIENT EDUCATION: Education details: Pt educated throughout session about proper posture and technique with exercises. Improved exercise technique, movement at target joints, use of target muscles after min to mod verbal, visual, tactile cues.  Person educated: Patient Education method: Explanation, Demonstration, and Verbal cues Education comprehension: verbalized understanding and returned demonstration  HOME EXERCISE PROGRAM: Access Code: [email or web address] URL: [email or web address] Date: 07/02/2024 Prepared by: Lonni Gainer Exercises - Side Stepping with Counter Support  - 1 x daily - 7 x weekly - 3 sets - 10 reps - Sit to Stand with Armchair  - 1 x daily - 7 x weekly - 3 sets - 10 reps - Walking with Counter Support  - 1 x daily - 7 x weekly - 3 sets - 10 reps - Semi-Tandem Balance at [employer]  - 1 x daily - 7 x weekly - 2 sets - 4 reps - 15 seconds hold - Standing March with Counter Support  - 1 x daily - 7 x weekly - 3 sets - 10 reps - Lateral Step Up with Counter Support  - 1 x daily - 7 x weekly - 2 sets - 10 reps - Heel Raises with Counter Support  - 1 x daily - 7 x weekly - 3 sets - 15 reps - Standing Hip Abduction with Counter Support  - 1 x daily - 7 x weekly - 2 sets - 10 reps - One leg on ground other on shelf of low cabinet   - 1 x daily - 7 x weekly - 2 sets - 30 sec hold   GOALS: Goals reviewed with patient? Yes  SHORT TERM GOALS: Target date: 01/18/2024 Patient will be independent in home exercise program to improve strength/mobility for better functional independence with ADLs. Baseline: 01/15/2024= Patient able to verbalize and demonstrate his seated HEP without prompting- states compliance.  Goal  status: MET  LONG TERM GOALS: Target date: 09/05/2024 1. Patient will increase SIS-16 score by at least 10 points  to demonstrate increased ease with ADLs and quality of life.  Baseline: 54, 7/23: 51  9/25:66 Goal status: MET  2.  Patient (> 70 years old) will complete five times sit to stand test in < 15 seconds indicating an increased LE strength and improved balance. Baseline: 44.2 sec with use of BUE; 01/15/2024= 36.46 sec with BUE Support (Still unable to rise without UE Support and some posterior lean- CGA)  7/14 22.08 sec with UE pushing from arm rest. 39.30 sec with no UE support and min assist to prevent posterior LOB. 04/02/2024= 19.08 sec with min BUE support from arm rest; 04/02/2024= 32.35 sec without UE Support 9/25: 21.98 no UE - unablewithout UE support from standard armchair, 11/25: 33.80 without UE support Goal status: ONGOING  3.  Patient will increase Berg Balance score by > 6 points to demonstrate decreased fall risk during functional activities Baseline: 34 7/14: 38 04/02/2024= 46 Goal status: MET  4.  Patient will increase 10 meter walk test to >1.29m/s as to improve gait speed for better community ambulation and to reduce fall risk. Baseline: 0.53 m/s with 4WW; 01/15/2024= 0.58 m/s with 4WW 7/14: 0.70m/s; 04/02/2024= 0.68 m/s 9/25.68 m/s, 11/25 0.60 m/s Goal status: ONGOING  5.  Patient will  reduce timed up and go to <11 seconds to reduce fall risk and demonstrate improved transfer/gait ability. Baseline: 33 sec with 4WW; 01/15/2024= 25.42 sec avg with 4WW 7/14: 20.72 sec avg with 4WW 20.19 sec with 4WW, 11/25 24.12 sec Goal status: ONGOING  6. Patient will increase six minute walk test distance to >1000 for progression to community ambulator and improve gait ability Baseline: 310 feet using a 4WW with CGA and only completes 3:17 sec prior to requesting to sit secondary to fatigue; 01/15/2024= 610 feet in with 4WW 7/14: 669ft with 4WW; 03/21/24: 919ft 94m13sec (achieved a  negative split); 04/02/2024= 790 feet using 4WW 9/25:709 ft 10/7: 745 ft with 4WW, good reciprocal gait for most part, 11/25 723 ft Goal status: ONGOING  ASSESSMENT:  CLINICAL IMPRESSION:   Patient presents with good motivation for completion of physical therapy activities.  Patient continues with lower extremity strength and functional ambulation and strengthening training.  Patient also continues to progress with his balance training and showed high difficulty with activities performed blaze pods this date.Pt will continue to benefit from skilled physical therapy intervention to address impairments, improve QOL, and attain therapy goals.     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  REHAB POTENTIAL: Good CLINICAL DECISION MAKING: Evolving/moderate complexity  EVALUATION COMPLEXITY: Moderate  PLAN:  PT FREQUENCY: 1-2x/week  PT DURATION: 12 weeks  PLANNED INTERVENTIONS: 97164- PT Re-evaluation, 97750- Physical Performance Testing, 97110-Therapeutic exercises, 97530- Therapeutic activity, 97112- Neuromuscular re-education, 97535- Self Care, [id number]- Manual therapy, [phone]- Gait training, [phone]- Orthotic Initial, [phone]- Orthotic/Prosthetic subsequent, [phone]- Canalith repositioning, Patient/Family education, Balance training, Stair training, Taping, Joint mobilization, Spinal mobilization, Vestibular training, DME instructions, Wheelchair mobility training, Cryotherapy, and Moist heat  PLAN FOR NEXT SESSION:    *Per neurology note 01/22/2024: target BP  of 130-140/70-80* Activities to promote increased glute/ hip strengthening High intensity gait training with appropriate UE support  Activities that encourage single leg support    Note: Portions of this document were prepared using Dragon voice recognition software and although reviewed may contain unintentional dictation errors in syntax, grammar, or spelling.  Lonni KATHEE Gainer PT ,DPT Physical Therapist- [place]  [facility]   7:56 AM 08/15/2024      "

## 2024-08-20 ENCOUNTER — Ambulatory Visit: Admitting: Physical Therapy

## 2024-08-22 ENCOUNTER — Ambulatory Visit: Admitting: Physical Therapy

## 2024-08-22 DIAGNOSIS — R2681 Unsteadiness on feet: Secondary | ICD-10-CM | POA: Diagnosis present

## 2024-08-22 DIAGNOSIS — M6281 Muscle weakness (generalized): Secondary | ICD-10-CM | POA: Diagnosis present

## 2024-08-22 DIAGNOSIS — I63512 Cerebral infarction due to unspecified occlusion or stenosis of left middle cerebral artery: Secondary | ICD-10-CM | POA: Diagnosis present

## 2024-08-22 DIAGNOSIS — R262 Difficulty in walking, not elsewhere classified: Secondary | ICD-10-CM | POA: Diagnosis present

## 2024-08-22 NOTE — Therapy (Signed)
 " OUTPATIENT PHYSICAL THERAPY TREATMENT  Patient Name: [name] [name] MRN: [id number] DOB:16-Jul-1941, 84 y.o., male Today's Date: 08/22/2024  PCP: Rudy Alyce RAMAN, MD REFERRING PROVIDER:   Pegge Toribio PARAS, PA-C   END OF SESSION:   PT End of Session - 08/22/24 1202     Visit Number 54    Number of Visits 71    Date for Recertification  09/05/24    Progress Note Due on Visit 60    PT Start Time 1017    PT Stop Time 1057    PT Time Calculation (min) 40 min    Equipment Utilized During Treatment Gait belt    Activity Tolerance Patient tolerated treatment well;No increased pain    Behavior During Therapy WFL for tasks assessed/performed             Past Medical History:  Diagnosis Date   Arthritis    Depression    Diabetes mellitus without complication (HCC)    Gout    Hyperlipidemia    Hypertension    Ischemic cardiomyopathy    No past surgical history on file. 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   Ischemic cardiomyopathy 06/12/2018   Thrombocytosis 04/16/2016   Adjustment reaction with prolonged depressive reaction 01/22/2014   Visual loss, right eye 07/30/2012   Type 2 diabetes mellitus (HCC) 06/16/2011   Coronary artery disease 06/16/2011   Hypertension associated with diabetes (HCC) 06/16/2011   ONSET DATE: 11/16/2023 REFERRING DIAG: P36.487 (ICD-10-CM) - Acute ischemic left MCA stroke (HCC)  THERAPY DIAG:  Difficulty in walking, not elsewhere classified  Unsteadiness on feet  Muscle weakness (generalized)  Rationale for Evaluation and Treatment: Rehabilitation  SUBJECTIVE:                                                                                                                                                                                             SUBJECTIVE STATEMENT:   Pt states  he walks on the treadmill 3x per day 6 minutes at a time.  No changes to report since last session. Is feeling better from feeling ill and missing several appointments.   PERTINENT HISTORY: Pt is a pleasant 84 y/o male referred to PT s/p L MCA stroke. [facility]/16/20[facility] to [facility] with right facial droop left gaze preference and dysarthria. CT/MRI showed small acute nonhemorrhagic left MCA distribution infarction involving the left frontal lobe. Pt admitted to rehab 11/19/2023 PT/ST/OT. Pt now presents to PT eval in WC. He  reports he primarily uses a 4WW at home, but does not ambulate much during the day (maybe ten minutes total). He reports difficulty with standing up from chairs. He must use grab bars in his bathroom due to difficulty standing up from toilet. He has difficulty with stairs, requires help getting up step to enter his home. He reports no recent falls, but that he does stumble with his 4WW when fatigued. 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  PAIN:  Are you having pain? No   PRECAUTIONS: Fall; Per neurology 01/22/24, goal BP of 130-140/70-80  WEIGHT BEARING RESTRICTIONS: No  FALLS: Has patient fallen in last 6 months? No but does report stumbling with fatigue  LIVING ENVIRONMENT: Lives with: lives with their family son and DIL Lives in: House/apartment, two level home but not using upstairs, stays on first floor Stairs: 1 step to enter home, says no handrails but he has help getting up step Has following equipment at home: Walker - 4 wheeled, shower chair, and Grab bars  PLOF: Independent  PATIENT GOALS: everything: walking, balance, strength   OBJECTIVE:  Note: Objective measures were completed at Evaluation unless otherwise noted.  DIAGNOSTIC FINDINGS: MR brain 11/16/23:  IMPRESSION: 1. Small acute nonhemorrhagic left MCA distribution infarct involving the left frontal lobe. No associated mass  effect. 2. Additional punctate subcentimeter acute ischemic nonhemorrhagic infarct within the contralateral right basal ganglia. 3. Underlying moderately advanced chronic microvascular ischemic disease with a few scattered remote lacunar infarcts as above. 4. Chronic occlusion of the left vertebral artery.    Electronically Signed   By: Morene Hoard M.D.   On: 11/16/2023 23:48  CT ANGIO HEAD NECK 11/16/23  IMPRESSION: No large vessel occlusion or evidence of findings amenable to neurovascular intervention.   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   Multiple intracranial vascular stenoses as above. Focal short segment occlusion of an M2 inferior division branch of the right MCA with reconstitution noted.   Mild-to-moderate stenosis of the V4 segment right vertebral artery.   Emphysema (ICD10-J43.9).     Electronically Signed   By: Donnice Mania M.D.   On: 11/16/2023 15:50  CT HEAD 11/16/23: IMPRESSION: 1. 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 2. Subtle hypoattenuation at the anterior limb of the right internal capsule. 3. 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 4. Age indeterminate infarct in the left lentiform nucleus. 5. Moderate atrophy and white matter disease likely reflects the sequela of chronic microvascular ischemia. 6. No acute hemorrhage or mass lesion.   These results were called by telephone at the time of interpretation on 11/16/2023 at 3:13 pm to provider Dr. Matthews, who verbally acknowledged these results.    Electronically Signed   By: Lonni Necessary M.D.   On: 11/16/2023 15:14  TREATMENT DATE 08/22/24 :    TA- To improve functional movements patterns for everyday tasks   Circuit performed two times with 3 minutes seated break in between each circuit, completed circuit twice  standing heel raise x 15 3# AW standing hip extension 3# AW on B LE,2x10 standing hip abduction 3# AW on B LE, x20   Sidestepping with resistance on cable machine with UE assist in // bars x 4 laps ea direction 12.5# resistance  Sidestepping with banded resistance GTB at ankles in // bars x 6 laps   NMR: To facilitate reeducation of movement, balance, posture, coordination, and/or proprioception/kinesthetic sense.   airex pad on 6 in step 2 x 10 reps on ea LE - improved LE recovery with balance responses with reps.   1 LE on airex other on step R LE on airex 3 x 30 sec    PATIENT EDUCATION: Education details: Pt educated throughout session about proper posture and technique with exercises. Improved exercise technique, movement at target joints, use of target muscles after min to mod verbal, visual, tactile cues.  Person educated: Patient Education method: Explanation, Demonstration, and Verbal cues Education comprehension: verbalized understanding and returned demonstration  HOME EXERCISE PROGRAM: Access Code: [email or web address] URL: [email or web address] Date: 07/02/2024 Prepared by: Lonni Gainer Exercises - Side Stepping with Counter Support  - 1 x daily - 7 x weekly - 3 sets - 10 reps - Sit to Stand with Armchair  - 1 x daily - 7 x weekly - 3 sets - 10 reps - Walking with Counter Support  - 1 x daily - 7 x weekly - 3 sets - 10 reps - Semi-Tandem Balance at [employer] Open  - 1 x daily - 7 x weekly - 2 sets - 4 reps - 15 seconds hold - Standing March with Counter Support  - 1 x daily - 7 x weekly - 3 sets - 10 reps - Lateral Step Up with Counter Support  - 1 x daily - 7 x weekly - 2 sets - 10 reps - Heel Raises with Counter Support  - 1 x daily - 7 x weekly - 3 sets - 15 reps - Standing Hip Abduction  with Counter Support  - 1 x daily - 7 x weekly - 2 sets - 10 reps - One leg on ground other on shelf of low cabinet   - 1 x daily - 7 x weekly - 2 sets - 30 sec hold   GOALS: Goals reviewed with patient? Yes  SHORT TERM GOALS: Target date: 01/18/2024 Patient will be independent in home exercise program to improve strength/mobility for better functional independence with ADLs. Baseline: 01/15/2024= Patient able to verbalize and demonstrate his seated HEP without prompting- states compliance.  Goal status: MET  LONG TERM GOALS: Target date: 09/05/2024 1. Patient will increase SIS-16 score by at least 10 points  to demonstrate increased ease with ADLs and quality of life.  Baseline: 54, 7/23: 51  9/25:66 Goal status: MET  2.  Patient (> 21 years old) will complete five times sit to stand test in < 15 seconds indicating an increased LE strength and improved balance. Baseline: 44.2 sec with use of BUE; 01/15/2024= 36.46 sec with BUE Support (Still unable to rise without UE Support and some posterior lean- CGA)  7/14 22.08 sec with UE pushing from arm rest. 39.30 sec with no UE support and min assist to prevent posterior LOB. 04/02/2024= 19.08 sec with min BUE support from arm rest;  04/02/2024= 32.35 sec without UE Support 9/25: 21.98 no UE - unablewithout UE support from standard armchair, 11/25: 33.80 without UE support Goal status: ONGOING  3.  Patient will increase Berg Balance score by > 6 points to demonstrate decreased fall risk during functional activities Baseline: 34 7/14: 38 04/02/2024= 46 Goal status: MET  4.  Patient will increase 10 meter walk test to >1.20m/s as to improve gait speed for better community ambulation and to reduce fall risk. Baseline: 0.53 m/s with 4WW; 01/15/2024= 0.58 m/s with 4WW 7/14: 0.51m/s; 04/02/2024= 0.68 m/s 9/25.68 m/s, 11/25 0.60 m/s Goal status: ONGOING  5.  Patient will reduce timed up and go to <11 seconds to reduce fall risk and demonstrate improved  transfer/gait ability. Baseline: 33 sec with 4WW; 01/15/2024= 25.42 sec avg with 4WW 7/14: 20.72 sec avg with 4WW 20.19 sec with 4WW, 11/25 24.12 sec Goal status: ONGOING  6. Patient will increase six minute walk test distance to >1000 for progression to community ambulator and improve gait ability Baseline: 310 feet using a 4WW with CGA and only completes 3:17 sec prior to requesting to sit secondary to fatigue; 01/15/2024= 610 feet in with 4WW 7/14: 619ft with 4WW; 03/21/24: 926ft 80m13sec (achieved a negative split); 04/02/2024= 790 feet using 4WW 9/25:709 ft 10/7: 745 ft with 4WW, good reciprocal gait for most part, 11/25 723 ft Goal status: ONGOING  ASSESSMENT:  CLINICAL IMPRESSION:   Patient presents with good motivation for completion of physical therapy activities.  Patient continues with lower extremity strength and functional ambulation and strengthening training.  Patient continues to progress with hip strengthening as well as general lower extremity strengthening continues to be a weakness with his gait showing significant hip drop and resulting imbalance.  Patient reports activities today felt were very beneficial towards his areas of weakness and was instructed in how to properly practice similar activities at home although equipment to complete them may not be readily available outside of a gym environment. Pt will continue to benefit from skilled physical therapy intervention to address impairments, improve QOL, and attain therapy goals.     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  REHAB POTENTIAL: Good CLINICAL DECISION MAKING: Evolving/moderate complexity  EVALUATION COMPLEXITY: Moderate  PLAN:  PT FREQUENCY: 1-2x/week  PT DURATION: 12 weeks  PLANNED INTERVENTIONS: 97164- PT Re-evaluation, 97750- Physical Performance Testing, 97110-Therapeutic exercises, 97530- Therapeutic activity, 97112- Neuromuscular re-education, 97535- Self Care, [id number]- Manual therapy, [phone]- Gait training, [phone]- Orthotic Initial, [phone]- Orthotic/Prosthetic subsequent, [phone]- Canalith repositioning, Patient/Family education, Balance training, Stair training, Taping, Joint mobilization, Spinal mobilization, Vestibular training, DME instructions, Wheelchair mobility training, Cryotherapy, and Moist heat  PLAN FOR NEXT SESSION:    *Per neurology note 01/22/2024: target BP of 130-140/70-80* Activities to promote increased glute/ hip strengthening High intensity gait training with appropriate UE support  Activities that encourage single leg support    Note: Portions of this document were prepared using Dragon voice recognition software and although reviewed may contain unintentional dictation errors in syntax, grammar, or spelling.  Lonni KATHEE Gainer PT ,DPT Physical Therapist- [facility] [place]   12:04 PM 08/22/24      "

## 2024-08-27 ENCOUNTER — Ambulatory Visit: Admitting: Physical Therapy

## 2024-08-27 DIAGNOSIS — R262 Difficulty in walking, not elsewhere classified: Secondary | ICD-10-CM | POA: Diagnosis not present

## 2024-08-27 DIAGNOSIS — R2681 Unsteadiness on feet: Secondary | ICD-10-CM

## 2024-08-27 DIAGNOSIS — M6281 Muscle weakness (generalized): Secondary | ICD-10-CM

## 2024-08-27 NOTE — Therapy (Signed)
 " OUTPATIENT PHYSICAL THERAPY TREATMENT  Patient Name: [name] [name] MRN: [id number] DOB:12-16-1940, 84 y.o., male Today's Date: 08/27/2024  PCP: Rudy Alyce RAMAN, MD REFERRING PROVIDER:   Pegge Toribio PARAS, PA-C   END OF SESSION:   PT End of Session - 08/27/24 1059     Visit Number 55    Number of Visits 71    Date for Recertification  09/05/24    Progress Note Due on Visit 60    PT Start Time 1018    PT Stop Time 1058    PT Time Calculation (min) 40 min    Equipment Utilized During Treatment Gait belt    Activity Tolerance Patient tolerated treatment well;No increased pain    Behavior During Therapy WFL for tasks assessed/performed              Past Medical History:  Diagnosis Date   Arthritis    Depression    Diabetes mellitus without complication (HCC)    Gout    Hyperlipidemia    Hypertension    Ischemic cardiomyopathy    No past surgical history on file. 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   Ischemic cardiomyopathy 06/12/2018   Thrombocytosis 04/16/2016   Adjustment reaction with prolonged depressive reaction 01/22/2014   Visual loss, right eye 07/30/2012   Type 2 diabetes mellitus (HCC) 06/16/2011   Coronary artery disease 06/16/2011   Hypertension associated with diabetes (HCC) 06/16/2011   ONSET DATE: 11/16/2023 REFERRING DIAG: P36.487 (ICD-10-CM) - Acute ischemic left MCA stroke (HCC)  THERAPY DIAG:  Difficulty in walking, not elsewhere classified  Unsteadiness on feet  Muscle weakness (generalized)  Rationale for Evaluation and Treatment: Rehabilitation  SUBJECTIVE:                                                                                                                                                                                             SUBJECTIVE STATEMENT:   Pt  states he walks on the treadmill 3x per day 6 minutes at a time.  No changes to report since last session. Is feeling better from feeling ill and missing several appointments.   PERTINENT HISTORY: Pt is a pleasant 84 y/o male referred to PT s/p L MCA stroke. Presented 11/16/2023 to [facility] with right facial droop left gaze preference and dysarthria. CT/MRI showed small acute nonhemorrhagic left MCA distribution infarction involving the left frontal lobe. Pt admitted to rehab 11/19/2023 PT/ST/OT. Pt now presents to PT eval in [place].  He reports he primarily uses a 4WW at home, but does not ambulate much during the day (maybe ten minutes total). He reports difficulty with standing up from chairs. He must use grab bars in his bathroom due to difficulty standing up from toilet. He has difficulty with stairs, requires help getting up step to enter his home. He reports no recent falls, but that he does stumble with his 4WW when fatigued. 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  PAIN:  Are you having pain? No   PRECAUTIONS: Fall; Per neurology 01/22/24, goal BP of 130-140/70-80  WEIGHT BEARING RESTRICTIONS: No  FALLS: Has patient fallen in last 6 months? No but does report stumbling with fatigue  LIVING ENVIRONMENT: Lives with: lives with their family son and DIL Lives in: House/apartment, two level home but not using upstairs, stays on first floor Stairs: 1 step to enter home, says no handrails but he has help getting up step Has following equipment at home: Walker - 4 wheeled, shower chair, and Grab bars  PLOF: Independent  PATIENT GOALS: everything: walking, balance, strength   OBJECTIVE:  Note: Objective measures were completed at Evaluation unless otherwise noted.  DIAGNOSTIC FINDINGS: MR brain 11/16/23:  IMPRESSION: 1. Small acute nonhemorrhagic left MCA distribution infarct involving the left frontal lobe. No  associated mass effect. 2. Additional punctate subcentimeter acute ischemic nonhemorrhagic infarct within the contralateral right basal ganglia. 3. Underlying moderately advanced chronic microvascular ischemic disease with a few scattered remote lacunar infarcts as above. 4. Chronic occlusion of the left vertebral artery.    Electronically Signed   By: Morene Hoard M.D.   On: 11/16/2023 23:48  CT ANGIO HEAD NECK 11/16/23  IMPRESSION: No large vessel occlusion or evidence of findings amenable to neurovascular intervention.   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   Multiple intracranial vascular stenoses as above. Focal short segment occlusion of an M2 inferior division branch of the right MCA with reconstitution noted.   Mild-to-moderate stenosis of the V4 segment right vertebral artery.   Emphysema (ICD10-J43.9).     Electronically Signed   By: Donnice Mania M.D.   On: 11/16/2023 15:50  CT HEAD 11/16/23: IMPRESSION: 1. 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 2. Subtle hypoattenuation at the anterior limb of the right internal capsule. 3. 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 4. Age indeterminate infarct in the left lentiform nucleus. 5. Moderate atrophy and white matter disease likely reflects the sequela of chronic microvascular ischemia. 6. No acute hemorrhage or mass lesion.   These results were called by telephone at the time of interpretation on 11/16/2023 at 3:13 pm to provider Dr. Matthews, who verbally acknowledged these results.    Electronically Signed   By: Lonni Necessary M.D.   On: 11/16/2023 15:14  TREATMENT  DATE 08/27/24 :   TA- To improve functional movements patterns for everyday tasks   Circuit performed 2 times with 3 minutes seated break in between each circuit, completed circuit twice  standing heel raise 2 x 15 3# AW standing hip extension 3# AW on B LE,2x10 standing hip march  3# AW on B LE, x20   Sidestepping with resistance on cable machine with UE assist in // bars x 2 x 4 laps stepping to the left only with 12.5# resistance   Sidestepping with banded resistance GTB at ankles in // bars x 6 laps   NMR: To facilitate reeducation of movement, balance, posture, coordination, and/or proprioception/kinesthetic sense.   1 LE on airex other on step 3 x 30 sec ea- high challenge with both sides.    PATIENT EDUCATION: Education details: Pt educated throughout session about proper posture and technique with exercises. Improved exercise technique, movement at target joints, use of target muscles after min to mod verbal, visual, tactile cues.  Person educated: Patient Education method: Explanation, Demonstration, and Verbal cues Education comprehension: verbalized understanding and returned demonstration  HOME EXERCISE PROGRAM: Access Code: [email or web address] URL: [email or web address] Date: 07/02/2024 Prepared by: Lonni Gainer Exercises - Side Stepping with Counter Support  - 1 x daily - 7 x weekly - 3 sets - 10 reps - Sit to Stand with Armchair  - 1 x daily - 7 x weekly - 3 sets - 10 reps - Walking with Counter Support  - 1 x daily - 7 x weekly - 3 sets - 10 reps - Semi-Tandem Balance at [employer]  - 1 x daily - 7 x weekly - 2 sets - 4 reps - 15 seconds hold - Standing March with Counter Support  - 1 x daily - 7 x weekly - 3 sets - 10 reps - Lateral Step Up with Counter Support  - 1 x daily - 7 x weekly - 2 sets - 10 reps - Heel Raises with Counter Support  - 1 x daily - 7 x weekly - 3 sets - 15 reps - Standing Hip Abduction with Counter Support  - 1 x daily - 7 x weekly  - 2 sets - 10 reps - One leg on ground other on shelf of low cabinet   - 1 x daily - 7 x weekly - 2 sets - 30 sec hold   GOALS: Goals reviewed with patient? Yes  SHORT TERM GOALS: Target date: 01/18/2024 Patient will be independent in home exercise program to improve strength/mobility for better functional independence with ADLs. Baseline: 01/15/2024= Patient able to verbalize and demonstrate his seated HEP without prompting- states compliance.  Goal status: MET  LONG TERM GOALS: Target date: 09/05/2024 1. Patient will increase SIS-16 score by at least 10 points  to demonstrate increased ease with ADLs and quality of life.  Baseline: 54, 7/23: 51  9/25:66 Goal status: MET  2.  Patient (> 79 years old) will complete five times sit to stand test in < 15 seconds indicating an increased LE strength and improved balance. Baseline: 44.2 sec with use of BUE; 01/15/2024= 36.46 sec with BUE Support (Still unable to rise without UE Support and some posterior lean- CGA)  7/14 22.08 sec with UE pushing from arm rest. 39.30 sec with no UE support and min assist to prevent posterior LOB. 04/02/2024= 19.08 sec with min BUE support from arm rest; 04/02/2024= 32.35 sec without UE Support 9/25: 21.98 no UE - unablewithout UE  support from standard armchair, 11/25: 33.80 without UE support Goal status: ONGOING  3.  Patient will increase Berg Balance score by > 6 points to demonstrate decreased fall risk during functional activities Baseline: 34 7/14: 38 04/02/2024= 46 Goal status: MET  4.  Patient will increase 10 meter walk test to >1.30m/s as to improve gait speed for better community ambulation and to reduce fall risk. Baseline: 0.53 m/s with 4WW; 01/15/2024= 0.58 m/s with 4WW 7/14: 0.78m/s; 04/02/2024= 0.68 m/s 9/25.68 m/s, 11/25 0.60 m/s Goal status: ONGOING  5.  Patient will reduce timed up and go to <11 seconds to reduce fall risk and demonstrate improved transfer/gait ability. Baseline: 33 sec with 4WW;  01/15/2024= 25.42 sec avg with 4WW 7/14: 20.72 sec avg with 4WW 20.19 sec with 4WW, 11/25 24.12 sec Goal status: ONGOING  6. Patient will increase six minute walk test distance to >1000 for progression to community ambulator and improve gait ability Baseline: 310 feet using a 4WW with CGA and only completes 3:17 sec prior to requesting to sit secondary to fatigue; 01/15/2024= 610 feet in with 4WW 7/14: 660ft with 4WW; 03/21/24: 928ft 36m13sec (achieved a negative split); 04/02/2024= 790 feet using 4WW 9/25:709 ft 10/7: 745 ft with 4WW, good reciprocal gait for most part, 11/25 723 ft Goal status: ONGOING  ASSESSMENT:  CLINICAL IMPRESSION:   The patient presents with ongoing impairments in strength, balance, and functional movement patterns following CVA, demonstrated by the need for support and increased effort during therapeutic activities. The patient completed two rounds of a functional strengthening circuit including standing heel raises with ankle weights, standing hip extension with ankle weights, and standing hip marches with ankle weights to promote improved lower extremity strength and motor control for daily tasks. Lateral movement training was performed through sidestepping with cable resistance and additional sidestepping with band resistance to target hip stability and gait mechanics. Neuromuscular reeducation focused on balance and proprioceptive training using one lower extremity on an airex pad and the other on a step, which presented a high challenge bilaterally, indicating ongoing deficits in postural control. Overall, the patient tolerated the session well and continues to benefit from skilled physical therapy to improve safety, stability, and independence with functional mobility.    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  REHAB POTENTIAL: Good CLINICAL DECISION MAKING: Evolving/moderate complexity  EVALUATION COMPLEXITY: Moderate  PLAN:  PT FREQUENCY: 1-2x/week  PT DURATION: 12 weeks  PLANNED INTERVENTIONS: 97164- PT Re-evaluation, 97750- Physical Performance Testing, 97110-Therapeutic exercises, 97530- Therapeutic activity, 97112- Neuromuscular re-education, 97535- Self Care, [id number]- Manual therapy, [phone]- Gait training, [phone]- Orthotic Initial, [phone]- Orthotic/Prosthetic subsequent, [phone]- Canalith repositioning, Patient/Family education, Balance training, Stair training, Taping, Joint mobilization, Spinal mobilization, Vestibular training, DME instructions, Wheelchair mobility training, Cryotherapy, and Moist heat  PLAN FOR NEXT SESSION:    *Per neurology note 01/22/2024: target BP of 130-140/70-80* Activities to promote increased glute/ hip strengthening High intensity gait training with appropriate UE support  Activities that encourage single leg support    Note: Portions of this document were prepared using Dragon voice recognition software and although reviewed may contain unintentional dictation errors in syntax, grammar, or spelling.  Lonni NOVAK  Ebb KLEIN ,DPT Physical Therapist- [facility]   10:59 AM 08/27/24      "

## 2024-08-29 ENCOUNTER — Ambulatory Visit: Admitting: Physical Therapy

## 2024-08-29 NOTE — Therapy (Unsigned)
 " OUTPATIENT PHYSICAL THERAPY TREATMENT  Patient Name: [name] [name] MRN: [id number] DOB:1941/04/28, 84 y.o., male Today's Date: 08/29/2024  PCP: Rudy Alyce RAMAN, MD REFERRING PROVIDER:   Pegge Toribio PARAS, PA-C   END OF SESSION:         Past Medical History:  Diagnosis Date   Arthritis    Depression    Diabetes mellitus without complication (HCC)    Gout    Hyperlipidemia    Hypertension    Ischemic cardiomyopathy    No past surgical history on file. 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   Ischemic cardiomyopathy 06/12/2018   Thrombocytosis 04/16/2016   Adjustment reaction with prolonged depressive reaction 01/22/2014   Visual loss, right eye 07/30/2012   Type 2 diabetes mellitus (HCC) 06/16/2011   Coronary artery disease 06/16/2011   Hypertension associated with diabetes (HCC) 06/16/2011   ONSET DATE: 11/16/2023 REFERRING DIAG: P36.487 (ICD-10-CM) - Acute ischemic left MCA stroke (HCC)  THERAPY DIAG:  No diagnosis found.  Rationale for Evaluation and Treatment: Rehabilitation  SUBJECTIVE:                                                                                                                                                                                             SUBJECTIVE STATEMENT:   Pt states he walks on the treadmill 3x per day 6 minutes at a time.  No changes to report since last session. Is feeling better from feeling ill and missing several appointments.   PERTINENT HISTORY: Pt is a pleasant 84 y/o male referred to PT s/p L MCA stroke. Presented 11/16/2023 to [facility] with right facial droop left gaze preference and dysarthria. CT/MRI showed small acute nonhemorrhagic left MCA distribution infarction involving the left frontal lobe. Pt admitted to rehab 11/19/2023 PT/ST/OT. Pt now presents to PT  eval in WC. He reports he primarily uses a 4WW at home, but does not ambulate much during the day (maybe ten minutes total). He reports difficulty with standing up from chairs. He must use grab bars in his bathroom due to difficulty standing up from toilet. He has difficulty with stairs, requires help getting up step to enter his home. He reports no recent falls, but that he does stumble with his 4WW when fatigued. 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  PAIN:  Are you having pain? No   PRECAUTIONS: Fall; Per neurology 01/22/24, goal BP of 130-140/70-80  WEIGHT BEARING RESTRICTIONS: No  FALLS: Has patient fallen in last 6 months? No but does report stumbling with fatigue  LIVING ENVIRONMENT: Lives with: lives with their family son and DIL Lives in: House/apartment, two level home but not using upstairs, stays on first floor Stairs: 1 step to enter home, says no handrails but he has help getting up step Has following equipment at home: Walker - 4 wheeled, shower chair, and Grab bars  PLOF: Independent  PATIENT GOALS: everything: walking, balance, strength   OBJECTIVE:  Note: Objective measures were completed at Evaluation unless otherwise noted.  DIAGNOSTIC FINDINGS: MR brain 11/16/23:  IMPRESSION: 1. Small acute nonhemorrhagic left MCA distribution infarct involving the left frontal lobe. No associated mass effect. 2. Additional punctate subcentimeter acute ischemic nonhemorrhagic infarct within the contralateral right basal ganglia. 3. Underlying moderately advanced chronic microvascular ischemic disease with a few scattered remote lacunar infarcts as above. 4. Chronic occlusion of the left vertebral artery.    Electronically Signed   By: Morene Hoard M.D.   On: 11/16/2023 23:48  CT ANGIO HEAD NECK 11/16/23  IMPRESSION: No large vessel occlusion or evidence of findings amenable  to neurovascular intervention.   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   Multiple intracranial vascular stenoses as above. Focal short segment occlusion of an M2 inferior division branch of the right MCA with reconstitution noted.   Mild-to-moderate stenosis of the V4 segment right vertebral artery.   Emphysema (ICD10-J43.9).     Electronically Signed   By: Donnice Mania M.D.   On: 11/16/2023 15:50  CT HEAD 11/16/23: IMPRESSION: 1. 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 2. Subtle hypoattenuation at the anterior limb of the right internal capsule. 3. 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 4. Age indeterminate infarct in the left lentiform nucleus. 5. Moderate atrophy and white matter disease likely reflects the sequela of chronic microvascular ischemia. 6. No acute hemorrhage or mass lesion.   These results were called by telephone at the time of interpretation on 11/16/2023 at 3:13 pm to provider Dr. Matthews, who verbally acknowledged these results.    Electronically Signed   By: Lonni Necessary M.D.   On: 11/16/2023 15:14                                                                                 TREATMENT DATE 08/29/24 :   TA- To improve functional movements patterns for everyday tasks   Circuit performed 2 times with 3 minutes seated break in between each circuit, completed circuit twice  standing heel raise 2 x 15 3# AW standing hip extension 3# AW on B LE,2x10 standing hip march  3# AW on B LE, x20  Sidestepping with resistance on cable machine with UE assist in // bars x 2 x 4 laps stepping to the left only with 12.5# resistance   Sidestepping with banded resistance GTB at ankles in // bars x 6 laps    NMR: To facilitate  reeducation of movement, balance, posture, coordination, and/or proprioception/kinesthetic sense.  1 LE on airex other on step 3 x 30 sec ea- high challenge with both sides.    PATIENT EDUCATION: Education details: Pt educated throughout session about proper posture and technique with exercises. Improved exercise technique, movement at target joints, use of target muscles after min to mod verbal, visual, tactile cues.  Person educated: Patient Education method: Explanation, Demonstration, and Verbal cues Education comprehension: verbalized understanding and returned demonstration  HOME EXERCISE PROGRAM: Access Code: [email or web address] URL: [email or web address] Date: 07/02/2024 Prepared by: Lonni Gainer Exercises - Side Stepping with Counter Support  - 1 x daily - 7 x weekly - 3 sets - 10 reps - Sit to Stand with Armchair  - 1 x daily - 7 x weekly - 3 sets - 10 reps - Walking with Counter Support  - 1 x daily - 7 x weekly - 3 sets - 10 reps - Semi-Tandem Balance at [employer] Eyes Open  - 1 x daily - 7 x weekly - 2 sets - 4 reps - 15 seconds hold - Standing March with Counter Support  - 1 x daily - 7 x weekly - 3 sets - 10 reps - Lateral Step Up with Counter Support  - 1 x daily - 7 x weekly - 2 sets - 10 reps - Heel Raises with Counter Support  - 1 x daily - 7 x weekly - 3 sets - 15 reps - Standing Hip Abduction with Counter Support  - 1 x daily - 7 x weekly - 2 sets - 10 reps - One leg on ground other on shelf of low cabinet   - 1 x daily - 7 x weekly - 2 sets - 30 sec hold   GOALS: Goals reviewed with patient? Yes  SHORT TERM GOALS: Target date: 01/18/2024 Patient will be independent in home exercise program to improve strength/mobility for better functional independence with ADLs. Baseline: 01/15/2024= Patient able to verbalize and demonstrate his seated HEP without prompting- states compliance.  Goal status: MET  LONG TERM GOALS: Target date: 09/05/2024 1.  Patient will increase SIS-16 score by at least 10 points  to demonstrate increased ease with ADLs and quality of life.  Baseline: 54, 7/23: 51  9/25:66 Goal status: MET  2.  Patient (> 26 years old) will complete five times sit to stand test in < 15 seconds indicating an increased LE strength and improved balance. Baseline: 44.2 sec with use of BUE; 01/15/2024= 36.46 sec with BUE Support (Still unable to rise without UE Support and some posterior lean- CGA)  7/14 22.08 sec with UE pushing from arm rest. 39.30 sec with no UE support and min assist to prevent posterior LOB. 04/02/2024= 19.08 sec with min BUE support from arm rest; 04/02/2024= 32.35 sec without UE Support 9/25: 21.98 no UE - unablewithout UE support from standard armchair, 11/25: 33.80 without UE support Goal status: ONGOING  3.  Patient will increase Berg Balance score by > 6 points to demonstrate decreased fall risk during functional activities Baseline: 34 7/14: 38 04/02/2024= 46 Goal status: MET  4.  Patient will increase 10 meter walk test to >1.21m/s as to improve gait speed for better community ambulation and to reduce fall risk. Baseline: 0.53 m/s with 4WW; 01/15/2024= 0.58 m/s with 4WW 7/14: 0.54m/s; 04/02/2024= 0.68 m/s 9/25.68 m/s, 11/25 0.60 m/s Goal status: ONGOING  5.  Patient will reduce timed up and go to <11 seconds to reduce fall risk and  demonstrate improved transfer/gait ability. Baseline: 33 sec with 4WW; 01/15/2024= 25.42 sec avg with 4WW 7/14: 20.72 sec avg with 4WW 20.19 sec with 4WW, 11/25 24.12 sec Goal status: ONGOING  6. Patient will increase six minute walk test distance to >1000 for progression to community ambulator and improve gait ability Baseline: 310 feet using a 4WW with CGA and only completes 3:17 sec prior to requesting to sit secondary to fatigue; 01/15/2024= 610 feet in with 4WW 7/14: 617ft with 4WW; 03/21/24: 981ft 39m13sec (achieved a negative split); 04/02/2024= 790 feet using 4WW 9/25:709 ft  10/7: 745 ft with 4WW, good reciprocal gait for most part, 11/25 723 ft Goal status: ONGOING  ASSESSMENT:  CLINICAL IMPRESSION:   The patient presents with ongoing impairments in strength, balance, and functional movement patterns following CVA, demonstrated by the need for support and increased effort during therapeutic activities. The patient completed two rounds of a functional strengthening circuit including standing heel raises with ankle weights, standing hip extension with ankle weights, and standing hip marches with ankle weights to promote improved lower extremity strength and motor control for daily tasks. Lateral movement training was performed through sidestepping with cable resistance and additional sidestepping with band resistance to target hip stability and gait mechanics. Neuromuscular reeducation focused on balance and proprioceptive training using one lower extremity on an airex pad and the other on a step, which presented a high challenge bilaterally, indicating ongoing deficits in postural control. Overall, the patient tolerated the session well and continues to benefit from skilled physical therapy to improve safety, stability, and independence with functional mobility.    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  REHAB POTENTIAL: Good CLINICAL DECISION MAKING: Evolving/moderate complexity  EVALUATION COMPLEXITY: Moderate  PLAN:  PT FREQUENCY: 1-2x/week  PT DURATION: 12 weeks  PLANNED INTERVENTIONS: 97164- PT Re-evaluation,  97750- Physical Performance Testing, 97110-Therapeutic exercises, 97530- Therapeutic activity, 97112- Neuromuscular re-education, 97535- Self Care, 02859- Manual therapy, [phone]- Gait training, [phone]- Orthotic Initial, [phone]- Orthotic/Prosthetic subsequent, [phone]- Canalith repositioning, Patient/Family education, Balance training, Stair training, Taping, Joint mobilization, Spinal mobilization, Vestibular training, DME instructions, Wheelchair mobility training, Cryotherapy, and Moist heat  PLAN FOR NEXT SESSION:    *Per neurology note 01/22/2024: target BP of 130-140/70-80* Activities to promote increased glute/ hip strengthening High intensity gait training with appropriate UE support  Activities that encourage single leg support   Note: Portions of this document were prepared using Dragon voice recognition software and although reviewed may contain unintentional dictation errors in syntax, grammar, or spelling.  Lonni KATHEE Gainer PT ,DPT Physical Therapist- [facility]   7:43 AM 08/29/24      "

## 2024-09-02 NOTE — Progress Notes (Unsigned)
 "     Electrophysiology Clinic Note    Date:  09/02/2024  Patient ID:  [name] [name] [birth year], MRN [id number] PCP:  Rudy Alyce RAMAN, MD  Cardiologist:  None  Electrophysiologist:  OLE ONEIDA HOLTS, MD (Inactive)    ***refresh  Discussed the use of AI scribe software for clinical note transcription with the patient, who gave verbal consent to proceed.   Patient Profile    Chief Complaint: ***  History of Present Illness: [name] [name] is a 84 y.o. male with PMH notable for CAD s/p PCI, HFmrEF, HTN, HLD, PVCs, T2DM, CVA; seen today for for electrophysiology followup.   I last saw patient 02/2024 for hospital follow-up after cryptogenic stroke in 11/2023.  We discussed loop recorder at that appointment and he wished to proceed.  Insurance did not approve loop implant without a 2-week monitor so that was completed that did not show atrial fibrillation. He was then somewhat lost to follow-up.  Today, ***   - needs gen cards -- [place]??  Since last being seen in our clinic the patient reports doing ***.  he denies chest pain, palpitations, dyspnea, PND, orthopnea, nausea, vomiting, dizziness, syncope, edema, weight gain, or early satiety.      Arrhythmia/Device History No specialty comments available.    ROS:  Please see the history of present illness. All other systems are reviewed and otherwise negative.    Physical Exam    VS:  There were no vitals taken for this visit. BMI: There is no height or weight on file to calculate BMI.           Wt Readings from Last 3 Encounters:  06/07/24 176 lb 6.4 oz (80 kg)  01/08/24 186 lb 6.4 oz (84.6 kg)  11/21/23 195 lb 12.3 oz (88.8 kg)     ***  GEN- The patient is well appearing, alert and oriented x 3 today.   Lungs- Clear to ausculation bilaterally, normal work of breathing.  Heart- {Blank single:19197::Regular,Irregularly irregular} rate and rhythm, ***no murmurs, rubs or gallops Extremities- {EDEMA  LEVEL:28147::No} peripheral edema, warm, dry   Studies Reviewed   Previous EP, cardiology notes.    EKG is ordered. Personal review of EKG from today shows:  ***        TTE, 11/17/2023  1. Left ventricular ejection fraction, by estimation, is 40 to 45%. The left ventricle has mildly decreased function. 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 There is moderate left ventricular hypertrophy. Left ventricular diastolic parameters are consistent with Grade I diastolic dysfunction (impaired relaxation).   2. Right ventricular systolic function is normal. The right ventricular size is normal.   3. The mitral valve is normal in structure. No evidence of mitral valve regurgitation. No evidence of mitral stenosis.   4. The aortic valve is calcified. There is mild calcification of the aortic valve. Aortic valve regurgitation is not visualized. Aortic valve sclerosis/calcification is present, without any evidence of aortic stenosis.   5. The inferior vena cava is normal in size with greater than 50% respiratory variability, suggesting right atrial pressure of 3 mmHg.    TTE, 07/11/2018 (via care everywhere)   MILD LV DYSFUNCTION (See above, 45%)    NORMAL LA PRESSURES WITH DIASTOLIC DYSFUNCTION    NORMAL RIGHT VENTRICULAR SYSTOLIC FUNCTION    VALVULAR REGURGITATION: TRIVIAL TR    NO VALVULAR STENOSIS  TRIVIAL PERICARDIAL EFFUSION    ECTOPY THROUGHOUT EXAM    NO PRIOR STUDY FOR COMPARISON   Assessment and Plan     #) cryptogenic stroke   #) ***   {Are you ordering a CV Procedure (e.g. stress test, cath, DCCV, TEE, etc)?   Press F2        :[id number]}   Current medicines are reviewed at length with the patient today.   The patient {ACTIONS; HAS/DOES NOT HAVE:19233} concerns regarding his medicines.  The following changes  were made today:  {NONE DEFAULTED:18576}  Labs/ tests ordered today include: *** No orders of the defined types were placed in this encounter.    Disposition: Follow up with {EPMDS:28135::EP Team} or EP APP {EPFOLLOW UP:28173}   Signed, Chantal Needle, NP  09/02/24  6:51 PM  Electrophysiology [facility] "

## 2024-09-03 ENCOUNTER — Ambulatory Visit: Admitting: Physical Therapy

## 2024-09-03 ENCOUNTER — Ambulatory Visit: Attending: Cardiology | Admitting: Cardiology

## 2024-09-03 DIAGNOSIS — I639 Cerebral infarction, unspecified: Secondary | ICD-10-CM

## 2024-09-05 ENCOUNTER — Ambulatory Visit: Admitting: Physical Therapy

## 2024-09-05 DIAGNOSIS — M6281 Muscle weakness (generalized): Secondary | ICD-10-CM

## 2024-09-05 DIAGNOSIS — R262 Difficulty in walking, not elsewhere classified: Secondary | ICD-10-CM | POA: Diagnosis not present

## 2024-09-05 DIAGNOSIS — I63512 Cerebral infarction due to unspecified occlusion or stenosis of left middle cerebral artery: Secondary | ICD-10-CM

## 2024-09-05 DIAGNOSIS — R2681 Unsteadiness on feet: Secondary | ICD-10-CM

## 2024-09-05 NOTE — Therapy (Signed)
 " OUTPATIENT PHYSICAL THERAPY TREATMENT  Patient Name: [name] [name] MRN: [id number] DOB:1941/06/07, 84 y.o., male Today's Date: 09/05/2024  PCP: Rudy Alyce RAMAN, MD REFERRING PROVIDER:   Pegge Toribio PARAS, PA-C   END OF SESSION:   PT End of Session - 09/05/24 1016     Visit Number 56    Number of Visits 71    Date for Recertification  09/05/24    Progress Note Due on Visit 60    PT Start Time 1007    PT Stop Time 1048    PT Time Calculation (min) 41 min    Equipment Utilized During Treatment Gait belt    Activity Tolerance Patient tolerated treatment well;No increased pain    Behavior During Therapy WFL for tasks assessed/performed               Past Medical History:  Diagnosis Date   Arthritis    Depression    Diabetes mellitus without complication (HCC)    Gout    Hyperlipidemia    Hypertension    Ischemic cardiomyopathy    No past surgical history on file. 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   Ischemic cardiomyopathy 06/12/2018   Thrombocytosis 04/16/2016   Adjustment reaction with prolonged depressive reaction 01/22/2014   Visual loss, right eye 07/30/2012   Type 2 diabetes mellitus (HCC) 06/16/2011   Coronary artery disease 06/16/2011   Hypertension associated with diabetes (HCC) 06/16/2011   ONSET DATE: 11/16/2023 REFERRING DIAG: P36.487 (ICD-10-CM) - Acute ischemic left MCA stroke (HCC)  THERAPY DIAG:  Difficulty in walking, not elsewhere classified  Unsteadiness on feet  Muscle weakness (generalized)  Acute ischemic left MCA stroke (HCC)  Rationale for Evaluation and Treatment: Rehabilitation  SUBJECTIVE:                                                                                                                                                                                              SUBJECTIVE STATEMENT:   Pt states he walks on the treadmill 3x per day 6 minutes at a time.  No changes to report since last session.Had to miss last appointment due to the ice.   PERTINENT HISTORY: Pt is a pleasant 84 y/o male referred to PT s/p L MCA stroke. Presented 11/16/2023 to [facility] with right facial droop left gaze preference and dysarthria. CT/MRI showed small acute nonhemorrhagic left MCA distribution infarction involving the left frontal lobe. Pt admitted to rehab 11/19/2023 PT/ST/OT. Pt now  presents to PT eval in WC. He reports he primarily uses a 4WW at home, but does not ambulate much during the day (maybe ten minutes total). He reports difficulty with standing up from chairs. He must use grab bars in his bathroom due to difficulty standing up from toilet. He has difficulty with stairs, requires help getting up step to enter his home. He reports no recent falls, but that he does stumble with his 4WW when fatigued. 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  PAIN:  Are you having pain? No   PRECAUTIONS: Fall; Per neurology 01/22/24, goal BP of 130-140/70-80  WEIGHT BEARING RESTRICTIONS: No  FALLS: Has patient fallen in last 6 months? No but does report stumbling with fatigue  LIVING ENVIRONMENT: Lives with: lives with their family son and DIL Lives in: House/apartment, two level home but not using upstairs, stays on first floor Stairs: 1 step to enter home, says no handrails but he has help getting up step Has following equipment at home: Walker - 4 wheeled, shower chair, and Grab bars  PLOF: Independent  PATIENT GOALS: everything: walking, balance, strength   OBJECTIVE:  Note: Objective measures were completed at Evaluation unless otherwise noted.  DIAGNOSTIC FINDINGS: MR brain 11/16/23:  IMPRESSION: 1. Small acute nonhemorrhagic left MCA distribution infarct involving the left frontal lobe.  No associated mass effect. 2. Additional punctate subcentimeter acute ischemic nonhemorrhagic infarct within the contralateral right basal ganglia. 3. Underlying moderately advanced chronic microvascular ischemic disease with a few scattered remote lacunar infarcts as above. 4. Chronic occlusion of the left vertebral artery.    Electronically Signed   By: Morene Hoard M.D.   On: 11/16/2023 23:48  CT ANGIO HEAD NECK 11/16/23  IMPRESSION: No large vessel occlusion or evidence of findings amenable to neurovascular intervention.   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   Multiple intracranial vascular stenoses as above. Focal short segment occlusion of an M2 inferior division branch of the right MCA with reconstitution noted.   Mild-to-moderate stenosis of the V4 segment right vertebral artery.   Emphysema (ICD10-J43.9).     Electronically Signed   By: Donnice Mania M.D.   On: 11/16/2023 15:50  CT HEAD 11/16/23: IMPRESSION: 1. 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 2. Subtle hypoattenuation at the anterior limb of the right internal capsule. 3. 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 4. Age indeterminate infarct in the left lentiform nucleus. 5. Moderate atrophy and white matter disease likely reflects the sequela of chronic microvascular ischemia. 6. No acute hemorrhage or mass lesion.   These results were called by telephone at the time of interpretation on 11/16/2023 at 3:13 pm to provider Dr. Matthews, who verbally acknowledged these results.    Electronically Signed   By: Lonni Necessary M.D.   On: 11/16/2023 15:14  TREATMENT DATE 09/05/24 :   TA- To improve functional movements patterns for everyday tasks   Circuit performed 2 times with 3 minutes seated break in between each circuit, completed circuit twice - all with 4# AW  standing heel raise 2 x 15 3# AW standing hip extension 3# AW on B LE,2x10 standing hip open the gate 2 x 10   Sidestepping with banded resistance GTB at ankles in // bars 2 x 6 laps   NMR: To facilitate reeducation of movement, balance, posture, coordination, and/or proprioception/kinesthetic sense.  1 LE on airex other on step 3 x 30 sec ea- high challenge with both sides.   TE- To improve strength, endurance, mobility, and function of specific targeted muscle groups or improve joint range of motion or improve muscle flexibility  Seated HS curls 2 x 10 ea LE with GTB   Pt required occasional rest breaks due fatigue, PT was attentive to when pt appeared to be tired or winded in order to prevent excessive fatigue.   PATIENT EDUCATION: Education details: Pt educated throughout session about proper posture and technique with exercises. Improved exercise technique, movement at target joints, use of target muscles after min to mod verbal, visual, tactile cues.  Person educated: Patient Education method: Explanation, Demonstration, and Verbal cues Education comprehension: verbalized understanding and returned demonstration  HOME EXERCISE PROGRAM: Access Code: [email or web address] URL: [email or web address] Date: 07/02/2024 Prepared by: Lonni Gainer Exercises - Side Stepping with Counter Support  - 1 x daily - 7 x weekly - 3 sets - 10 reps - Sit to Stand with Armchair  - 1 x daily - 7 x weekly - 3 sets - 10 reps - Walking with Counter Support  - 1 x daily - 7 x weekly - 3 sets - 10 reps - Semi-Tandem Balance at [employer] Eyes Open  - 1 x daily - 7 x weekly - 2 sets - 4 reps - 15 seconds hold - Standing March with Counter Support  - 1 x daily - 7 x weekly - 3 sets - 10  reps - Lateral Step Up with Counter Support  - 1 x daily - 7 x weekly - 2 sets - 10 reps - Heel Raises with Counter Support  - 1 x daily - 7 x weekly - 3 sets - 15 reps - Standing Hip Abduction with Counter Support  - 1 x daily - 7 x weekly - 2 sets - 10 reps - One leg on ground other on shelf of low cabinet   - 1 x daily - 7 x weekly - 2 sets - 30 sec hold   GOALS: Goals reviewed with patient? Yes  SHORT TERM GOALS: Target date: 01/18/2024 Patient will be independent in home exercise program to improve strength/mobility for better functional independence with ADLs. Baseline: 01/15/2024= Patient able to verbalize and demonstrate his seated HEP without prompting- states compliance.  Goal status: MET  LONG TERM GOALS: Target date: 09/05/2024 1. Patient will increase SIS-16 score by at least 10 points  to demonstrate increased ease with ADLs and quality of life.  Baseline: 54, 7/23: 51  9/25:66 Goal status: MET  2.  Patient (> 36 years old) will complete five times sit to stand test in < 15 seconds indicating an increased LE strength and improved balance. Baseline: 44.2 sec with use of BUE; 01/15/2024= 36.46 sec with BUE Support (Still unable to rise without UE Support and some posterior lean- CGA)  7/14 22.08 sec with UE pushing from arm  rest. 39.30 sec with no UE support and min assist to prevent posterior LOB. 04/02/2024= 19.08 sec with min BUE support from arm rest; 04/02/2024= 32.35 sec without UE Support 9/25: 21.98 no UE - unablewithout UE support from standard armchair, 11/25: 33.80 without UE support Goal status: ONGOING  3.  Patient will increase Berg Balance score by > 6 points to demonstrate decreased fall risk during functional activities Baseline: 34 7/14: 38 04/02/2024= 46 Goal status: MET  4.  Patient will increase 10 meter walk test to >1.50m/s as to improve gait speed for better community ambulation and to reduce fall risk. Baseline: 0.53 m/s with 4WW; 01/15/2024= 0.58 m/s with  4WW 7/14: 0.3m/s; 04/02/2024= 0.68 m/s 9/25.68 m/s, 11/25 0.60 m/s Goal status: ONGOING  5.  Patient will reduce timed up and go to <11 seconds to reduce fall risk and demonstrate improved transfer/gait ability. Baseline: 33 sec with 4WW; 01/15/2024= 25.42 sec avg with 4WW 7/14: 20.72 sec avg with 4WW 20.19 sec with 4WW, 11/25 24.12 sec Goal status: ONGOING  6. Patient will increase six minute walk test distance to >1000 for progression to community ambulator and improve gait ability Baseline: 310 feet using a 4WW with CGA and only completes 3:17 sec prior to requesting to sit secondary to fatigue; 01/15/2024= 610 feet in with 4WW 7/14: 656ft with 4WW; 03/21/24: 91ft 48m13sec (achieved a negative split); 04/02/2024= 790 feet using 4WW 9/25:709 ft 10/7: 745 ft with 4WW, good reciprocal gait for most part, 11/25 723 ft Goal status: ONGOING  ASSESSMENT:  CLINICAL IMPRESSION:   The patient is an 84 year old male s/p stroke who continues to present with residual deficits impacting functional mobility, balance, and overall movement efficiency. During todays session he demonstrated good tolerance to therapeutic activities focused on improving functional movement patterns, including multi exercise circuits with ankle weights and resisted sidestepping to enhance lower extremity strength and stability. Neuromuscular reeducation on uneven surfaces revealed significant challenge bilaterally, indicating ongoing impairment in balance, proprioception, and postural control.  Continued physical therapy is beneficial to address these persistent deficits and reduce the patients risk of falls while promoting greater independence with daily mobility tasks. Ongoing skilled intervention will further facilitate neuromuscular reeducation, improve functional lower extremity strength, and enhance movement coordination to support safer and more efficient performance of everyday activities.  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  REHAB POTENTIAL: Good CLINICAL DECISION MAKING: Evolving/moderate complexity  EVALUATION COMPLEXITY: Moderate  PLAN:  PT FREQUENCY: 1-2x/week  PT DURATION: 12 weeks  PLANNED INTERVENTIONS: 97164- PT Re-evaluation, 97750- Physical Performance Testing, 97110-Therapeutic exercises, 97530- Therapeutic activity, 97112- Neuromuscular re-education, 97535- Self Care, [id number]- Manual therapy, [phone]- Gait training, [phone]- Orthotic Initial, [phone]- Orthotic/Prosthetic subsequent, [phone]- Canalith repositioning, Patient/Family education, Balance training, Stair training, Taping, Joint mobilization, Spinal mobilization, Vestibular training, DME instructions, Wheelchair mobility training, Cryotherapy, and Moist heat  PLAN FOR NEXT SESSION:    *Per neurology note 01/22/2024: target BP of 130-140/70-80* Activities to promote increased glute/ hip strengthening High intensity gait training with appropriate UE support  Activities that encourage single leg support  Note: Portions of this document were prepared using Dragon voice recognition software and although reviewed may  contain unintentional dictation errors in syntax, grammar, or spelling.  Lonni KATHEE Gainer PT ,DPT Physical Therapist- [facility]   10:55 AM 09/05/24      "

## 2024-09-09 ENCOUNTER — Encounter: Admitting: Physical Medicine and Rehabilitation

## 2024-09-10 ENCOUNTER — Ambulatory Visit: Admitting: Physical Therapy

## 2024-09-12 ENCOUNTER — Ambulatory Visit: Admitting: Physical Therapy

## 2024-09-12 NOTE — Therapy (Unsigned)
 " OUTPATIENT PHYSICAL THERAPY TREATMENT  Patient Name: [name] [name] MRN: [id number] DOB:25-Apr-1941, 84 y.o., male Today's Date: 09/12/2024  PCP: Rudy Alyce RAMAN, MD REFERRING PROVIDER:   Pegge Toribio PARAS, PA-C   END OF SESSION:          Past Medical History:  Diagnosis Date   Arthritis    Depression    Diabetes mellitus without complication (HCC)    Gout    Hyperlipidemia    Hypertension    Ischemic cardiomyopathy    No past surgical history on file. 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   Ischemic cardiomyopathy 06/12/2018   Thrombocytosis 04/16/2016   Adjustment reaction with prolonged depressive reaction 01/22/2014   Visual loss, right eye 07/30/2012   Type 2 diabetes mellitus (HCC) 06/16/2011   Coronary artery disease 06/16/2011   Hypertension associated with diabetes (HCC) 06/16/2011   ONSET DATE: 11/16/2023 REFERRING DIAG: P36.487 (ICD-10-CM) - Acute ischemic left MCA stroke (HCC)  THERAPY DIAG:  Difficulty in walking, not elsewhere classified  Unsteadiness on feet  Muscle weakness (generalized)  Acute ischemic left MCA stroke (HCC)  Rationale for Evaluation and Treatment: Rehabilitation  SUBJECTIVE:                                                                                                                                                                                             SUBJECTIVE STATEMENT:   Pt states he walks on the treadmill 3x per day 6 minutes at a time.  No changes to report since last session.Had to miss last appointment due to the ice.   PERTINENT HISTORY: Pt is a pleasant 84 y/o male referred to PT s/p L MCA stroke. Presented 11/16/2023 to [facility] with right facial droop left gaze preference and dysarthria. CT/MRI showed small acute nonhemorrhagic left MCA distribution  infarction involving the left frontal lobe. Pt admitted to rehab 11/19/2023 PT/ST/OT. Pt now presents to PT eval in WC. He reports he primarily uses a 4WW at home, but does not ambulate much during the day (maybe ten minutes total). He reports difficulty with standing up from chairs. He must use grab bars in his bathroom due to difficulty standing up from toilet. He has difficulty with stairs, requires help getting up step to enter his home. He reports no recent falls, but that he does stumble with his 4WW when fatigued. 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  PAIN:  Are you having pain? No   PRECAUTIONS: Fall; Per neurology 01/22/24, goal BP of 130-140/70-80  WEIGHT BEARING RESTRICTIONS: No  FALLS: Has patient fallen in last 6 months? No but does report stumbling with fatigue  LIVING ENVIRONMENT: Lives with: lives with their family son and DIL Lives in: House/apartment, two level home but not using upstairs, stays on first floor Stairs: 1 step to enter home, says no handrails but he has help getting up step Has following equipment at home: Walker - 4 wheeled, shower chair, and Grab bars  PLOF: Independent  PATIENT GOALS: everything: walking, balance, strength   OBJECTIVE:  Note: Objective measures were completed at Evaluation unless otherwise noted.  DIAGNOSTIC FINDINGS: MR brain 11/16/23:  IMPRESSION: 1. Small acute nonhemorrhagic left MCA distribution infarct involving the left frontal lobe. No associated mass effect. 2. Additional punctate subcentimeter acute ischemic nonhemorrhagic infarct within the contralateral right basal ganglia. 3. Underlying moderately advanced chronic microvascular ischemic disease with a few scattered remote lacunar infarcts as above. 4. Chronic occlusion of the left vertebral artery.    Electronically Signed   By: Morene Hoard M.D.   On: 11/16/2023 23:48  CT  ANGIO HEAD NECK 11/16/23  IMPRESSION: No large vessel occlusion or evidence of findings amenable to neurovascular intervention.   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   Multiple intracranial vascular stenoses as above. Focal short segment occlusion of an M2 inferior division branch of the right MCA with reconstitution noted.   Mild-to-moderate stenosis of the V4 segment right vertebral artery.   Emphysema (ICD10-J43.9).     Electronically Signed   By: Donnice Mania M.D.   On: 11/16/2023 15:50  CT HEAD 11/16/23: IMPRESSION: 1. 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 2. Subtle hypoattenuation at the anterior limb of the right internal capsule. 3. 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 4. Age indeterminate infarct in the left lentiform nucleus. 5. Moderate atrophy and white matter disease likely reflects the sequela of chronic microvascular ischemia. 6. No acute hemorrhage or mass lesion.   These results were called by telephone at the time of interpretation on 11/16/2023 at 3:13 pm to provider Dr. Matthews, who verbally acknowledged these results.    Electronically Signed   By: Lonni Necessary M.D.   On: 11/16/2023 15:14                                                                                 TREATMENT DATE 09/12/24 :   TA- To improve functional movements patterns for everyday tasks   Circuit performed 2 times with 3 minutes seated break in between each circuit, completed circuit twice - all with 4# AW  standing heel raise 2 x 15 3# AW standing hip extension 3# AW on B LE,2x10 standing hip open the gate 2 x 10   Sidestepping with banded resistance GTB at ankles in // bars 2 x 6 laps   NMR: To facilitate  reeducation of movement, balance, posture,  coordination, and/or proprioception/kinesthetic sense.  1 LE on airex other on step 3 x 30 sec ea- high challenge with both sides.   TE- To improve strength, endurance, mobility, and function of specific targeted muscle groups or improve joint range of motion or improve muscle flexibility  Seated HS curls 2 x 10 ea LE with GTB   Pt required occasional rest breaks due fatigue, PT was attentive to when pt appeared to be tired or winded in order to prevent excessive fatigue.   PATIENT EDUCATION: Education details: Pt educated throughout session about proper posture and technique with exercises. Improved exercise technique, movement at target joints, use of target muscles after min to mod verbal, visual, tactile cues.  Person educated: Patient Education method: Explanation, Demonstration, and Verbal cues Education comprehension: verbalized understanding and returned demonstration  HOME EXERCISE PROGRAM: Access Code: [email or web address] URL: [email or web address] Date: 07/02/2024 Prepared by: Lonni Gainer Exercises - Side Stepping with Counter Support  - 1 x daily - 7 x weekly - 3 sets - 10 reps - Sit to Stand with Armchair  - 1 x daily - 7 x weekly - 3 sets - 10 reps - Walking with Counter Support  - 1 x daily - 7 x weekly - 3 sets - 10 reps - Semi-Tandem Balance at [employer] Eyes Open  - 1 x daily - 7 x weekly - 2 sets - 4 reps - 15 seconds hold - Standing March with Counter Support  - 1 x daily - 7 x weekly - 3 sets - 10 reps - Lateral Step Up with Counter Support  - 1 x daily - 7 x weekly - 2 sets - 10 reps - Heel Raises with Counter Support  - 1 x daily - 7 x weekly - 3 sets - 15 reps - Standing Hip Abduction with Counter Support  - 1 x daily - 7 x weekly - 2 sets - 10 reps - One leg on ground other on shelf of low cabinet   - 1 x daily - 7 x weekly - 2 sets - 30 sec hold   GOALS: Goals reviewed with patient? Yes  SHORT TERM GOALS: Target  date: 01/18/2024 Patient will be independent in home exercise program to improve strength/mobility for better functional independence with ADLs. Baseline: 01/15/2024= Patient able to verbalize and demonstrate his seated HEP without prompting- states compliance.  Goal status: MET  LONG TERM GOALS: Target date: 09/05/2024 1. Patient will increase SIS-16 score by at least 10 points  to demonstrate increased ease with ADLs and quality of life.  Baseline: 54, 7/23: 51  9/25:66 Goal status: MET  2.  Patient (> 91 years old) will complete five times sit to stand test in < 15 seconds indicating an increased LE strength and improved balance. Baseline: 44.2 sec with use of BUE; 01/15/2024= 36.46 sec with BUE Support (Still unable to rise without UE Support and some posterior lean- CGA)  7/14 22.08 sec with UE pushing from arm rest. 39.30 sec with no UE support and min assist to prevent posterior LOB. 04/02/2024= 19.08 sec with min BUE support from arm rest; 04/02/2024= 32.35 sec without UE Support 9/25: 21.98 no UE - unablewithout UE support from standard armchair, 11/25: 33.80 without UE support Goal status: ONGOING  3.  Patient will increase Berg Balance score by > 6 points to demonstrate decreased fall risk during functional activities Baseline: 34 7/14: 38 04/02/2024= 46 Goal status: MET  4.  Patient will increase 10 meter walk test  to >1.22m/s as to improve gait speed for better community ambulation and to reduce fall risk. Baseline: 0.53 m/s with 4WW; 01/15/2024= 0.58 m/s with 4WW 7/14: 0.19m/s; 04/02/2024= 0.68 m/s 9/25.68 m/s, 11/25 0.60 m/s Goal status: ONGOING  5.  Patient will reduce timed up and go to <11 seconds to reduce fall risk and demonstrate improved transfer/gait ability. Baseline: 33 sec with 4WW; 01/15/2024= 25.42 sec avg with 4WW 7/14: 20.72 sec avg with 4WW 20.19 sec with 4WW, 11/25 24.12 sec Goal status: ONGOING  6. Patient will increase six minute walk test distance to >1000 for  progression to community ambulator and improve gait ability Baseline: 310 feet using a 4WW with CGA and only completes 3:17 sec prior to requesting to sit secondary to fatigue; 01/15/2024= 610 feet in with 4WW 7/14: 652ft with 4WW; 03/21/24: 946ft 68m13sec (achieved a negative split); 04/02/2024= 790 feet using 4WW 9/25:709 ft 10/7: 745 ft with 4WW, good reciprocal gait for most part, 11/25 723 ft Goal status: ONGOING  ASSESSMENT:  CLINICAL IMPRESSION:   The patient is an 84 year old male s/p stroke who continues to present with residual deficits impacting functional mobility, balance, and overall movement efficiency. During todays session he demonstrated good tolerance to therapeutic activities focused on improving functional movement patterns, including multi exercise circuits with ankle weights and resisted sidestepping to enhance lower extremity strength and stability. Neuromuscular reeducation on uneven surfaces revealed significant challenge bilaterally, indicating ongoing impairment in balance, proprioception, and postural control.  Continued physical therapy is beneficial to address these persistent deficits and reduce the patients risk of falls while promoting greater independence with daily mobility tasks. Ongoing skilled intervention will further facilitate neuromuscular reeducation, improve functional lower extremity strength, and enhance movement coordination to support safer and more efficient performance of everyday activities.  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  REHAB POTENTIAL: Good CLINICAL DECISION MAKING: Evolving/moderate complexity  EVALUATION COMPLEXITY: Moderate  PLAN:  PT FREQUENCY: 1-2x/week  PT DURATION: 12 weeks  PLANNED INTERVENTIONS: 97164- PT Re-evaluation, 97750- Physical Performance Testing, 97110-Therapeutic exercises, 97530- Therapeutic activity, 97112- Neuromuscular re-education, 97535- Self Care, [id number]- Manual therapy, [phone]- Gait training, [phone]- Orthotic Initial, [phone]- Orthotic/Prosthetic subsequent, [phone]- Canalith repositioning, Patient/Family education, Balance training, Stair training, Taping, Joint mobilization, Spinal mobilization, Vestibular training, DME instructions, Wheelchair mobility training, Cryotherapy, and Moist heat  PLAN FOR NEXT SESSION:    *Per neurology note 01/22/2024: target BP of 130-140/70-80* Activities to promote increased glute/ hip strengthening High intensity gait training with appropriate UE support  Activities that encourage single leg support  Note: Portions of this document were prepared using Dragon voice recognition software and although reviewed may contain unintentional dictation errors in syntax, grammar, or spelling.  Lonni KATHEE Gainer PT ,DPT Physical Therapist- [place]  [facility]   9:17 AM 09/12/24      "

## 2024-09-17 ENCOUNTER — Ambulatory Visit: Admitting: Physical Therapy

## 2024-09-19 ENCOUNTER — Ambulatory Visit: Admitting: Physical Therapy

## 2024-09-24 ENCOUNTER — Ambulatory Visit: Admitting: Physical Therapy

## 2024-09-26 ENCOUNTER — Ambulatory Visit: Admitting: Physical Therapy

## 2024-10-01 ENCOUNTER — Ambulatory Visit: Admitting: Physical Therapy

## 2024-10-01 ENCOUNTER — Encounter: Admitting: Physical Medicine and Rehabilitation

## 2024-10-03 ENCOUNTER — Ambulatory Visit: Admitting: Physical Therapy

## 2024-10-08 ENCOUNTER — Ambulatory Visit: Admitting: Physical Therapy

## 2024-10-10 ENCOUNTER — Ambulatory Visit: Admitting: Physical Therapy

## 2024-10-15 ENCOUNTER — Ambulatory Visit: Admitting: Physical Therapy

## 2024-10-17 ENCOUNTER — Ambulatory Visit: Admitting: Physical Therapy
